# Patient Record
Sex: Male | Born: 1975 | Race: Black or African American | Hispanic: No | Marital: Married | Smoking: Former smoker
Health system: Southern US, Community
[De-identification: ages and names within clinical notes are randomized; demographics above are authoritative.]

## PROBLEM LIST (undated history)

## (undated) DIAGNOSIS — R609 Edema, unspecified: Secondary | ICD-10-CM

## (undated) DIAGNOSIS — D863 Sarcoidosis of skin: Secondary | ICD-10-CM

## (undated) DIAGNOSIS — N433 Hydrocele, unspecified: Secondary | ICD-10-CM

## (undated) DIAGNOSIS — K219 Gastro-esophageal reflux disease without esophagitis: Secondary | ICD-10-CM

## (undated) DIAGNOSIS — I2721 Secondary pulmonary arterial hypertension: Secondary | ICD-10-CM

## (undated) DIAGNOSIS — I10 Essential (primary) hypertension: Secondary | ICD-10-CM

## (undated) DIAGNOSIS — R600 Localized edema: Secondary | ICD-10-CM

## (undated) DIAGNOSIS — J309 Allergic rhinitis, unspecified: Secondary | ICD-10-CM

## (undated) DIAGNOSIS — D563 Thalassemia minor: Secondary | ICD-10-CM

## (undated) DIAGNOSIS — M7021 Olecranon bursitis, right elbow: Secondary | ICD-10-CM

## (undated) DIAGNOSIS — E669 Obesity, unspecified: Secondary | ICD-10-CM

## (undated) DIAGNOSIS — R7303 Prediabetes: Secondary | ICD-10-CM

## (undated) DIAGNOSIS — E66812 Obesity, class 2: Secondary | ICD-10-CM

## (undated) DIAGNOSIS — D860 Sarcoidosis of lung: Secondary | ICD-10-CM

## (undated) DIAGNOSIS — J310 Chronic rhinitis: Secondary | ICD-10-CM

## (undated) DIAGNOSIS — N182 Chronic kidney disease, stage 2 (mild): Secondary | ICD-10-CM

## (undated) DIAGNOSIS — J381 Polyp of vocal cord and larynx: Secondary | ICD-10-CM

## (undated) DIAGNOSIS — R490 Dysphonia: Secondary | ICD-10-CM

## (undated) HISTORY — DX: Edema, unspecified: R60.9

## (undated) HISTORY — DX: Olecranon bursitis, right elbow: M70.21

## (undated) HISTORY — DX: Secondary pulmonary arterial hypertension: I27.21

## (undated) HISTORY — DX: Hydrocele, unspecified: N43.3

## (undated) HISTORY — DX: Dysphonia: R49.0

## (undated) HISTORY — DX: Gastro-esophageal reflux disease without esophagitis: K21.9

## (undated) HISTORY — DX: Polyp of vocal cord and larynx: J38.1

## (undated) HISTORY — DX: Essential (primary) hypertension: I10

## (undated) HISTORY — DX: Sarcoidosis of skin: D86.3

## (undated) HISTORY — DX: Thalassemia minor: D56.3

## (undated) HISTORY — DX: Chronic rhinitis: J31.0

## (undated) HISTORY — DX: Allergic rhinitis, unspecified: J30.9

## (undated) HISTORY — DX: Localized edema: R60.0

## (undated) HISTORY — DX: Chronic kidney disease, stage 2 (mild): N18.2

## (undated) HISTORY — DX: Prediabetes: R73.03

## (undated) HISTORY — DX: Obesity, unspecified: E66.9

## (undated) HISTORY — DX: Obesity, class 2: E66.812

---

## 2003-06-10 HISTORY — PX: WISDOM TOOTH EXTRACTION: SHX21

## 2008-06-09 HISTORY — PX: SKIN GRAFT: SHX250

## 2010-03-29 ENCOUNTER — Encounter: Admission: RE | Admit: 2010-03-29 | Discharge: 2010-03-29 | Payer: Self-pay | Admitting: Family Medicine

## 2010-04-12 ENCOUNTER — Ambulatory Visit: Payer: Self-pay | Admitting: Critical Care Medicine

## 2010-04-12 ENCOUNTER — Encounter: Payer: Self-pay | Admitting: Critical Care Medicine

## 2010-04-12 DIAGNOSIS — R05 Cough: Secondary | ICD-10-CM

## 2010-04-12 DIAGNOSIS — R059 Cough, unspecified: Secondary | ICD-10-CM | POA: Insufficient documentation

## 2010-04-19 ENCOUNTER — Ambulatory Visit: Payer: Self-pay | Admitting: Cardiology

## 2010-04-21 ENCOUNTER — Encounter: Payer: Self-pay | Admitting: Critical Care Medicine

## 2010-05-17 ENCOUNTER — Ambulatory Visit: Payer: Self-pay | Admitting: Critical Care Medicine

## 2010-05-17 DIAGNOSIS — K219 Gastro-esophageal reflux disease without esophagitis: Secondary | ICD-10-CM

## 2010-05-17 DIAGNOSIS — J309 Allergic rhinitis, unspecified: Secondary | ICD-10-CM

## 2010-06-12 ENCOUNTER — Telehealth: Payer: Self-pay | Admitting: Critical Care Medicine

## 2010-06-21 ENCOUNTER — Encounter: Payer: Self-pay | Admitting: Critical Care Medicine

## 2010-06-21 ENCOUNTER — Ambulatory Visit
Admission: RE | Admit: 2010-06-21 | Discharge: 2010-06-21 | Payer: Self-pay | Source: Home / Self Care | Attending: Critical Care Medicine | Admitting: Critical Care Medicine

## 2010-06-25 ENCOUNTER — Telehealth: Payer: Self-pay | Admitting: Critical Care Medicine

## 2010-07-09 NOTE — Assessment & Plan Note (Signed)
Summary: Pulmonary Consultation   Copy to:  Dr. Joseph Art Primary Wyoma Genson/Referring Endiya Klahr:  NA  CC:  Pulmonary Consult - cough..  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  not irritating.  cough a few seconds and stop cough was dry.  never productive.  then one month ago,  began to worsen,  would formerly come on monthly and go away, but this time felt like could not overcome the urge to cough.  The cough was repetative.  Had phlegm.  The swelling in feet.  Always have foot pain.  swelling for one month  impressions from the socks.  then saw lauenstein rx diuretic, did not help ,  Hctz at first then lasix pain in scrotal area and had a lump.  cxr obtained.  u/s ordered. of scrotum  then one week later and was told ? sarcoid.  cxr:  neg.  no pfts.  occ now is dry cough  2005 was exposed to a chemical TDH  actively used in foam.  sweats and dyspnea.   2weeks was exposed.  vapor.         This is a 35year old male who presents with cough.  The patient complains of cough and mucous production, but denies history of diagnosed COPD, shortness of breath, chest tightness, chest pain worse with breathing and coughing, wheezing, nocturnal awakening, exercise induced symptoms, and congestion.  The cough is described as non-productive, productive of clear sputum, perceived to be from upper airway, associated with chest pain, does not interrupt sleep, occurs after eating, and without wheezing.  The dyspnea is described as insignificant.  The patient reports a history of occupational exposure:.  The patient denies any history of asthma, allergic rhinitis, COPD, sleep disordered breathing, obstructive sleep apnea, heart disease, thyroid disease, anemia, collagen vascular disease, osteporosis, kyphoscoliosis, occupational exposure: , and  cancer: .    Preventive Screening-Counseling & Management  Alcohol-Tobacco     Smoking Status: quit > 6 months     Year Started: 1996     Year Quit: 1997  Current Medications (verified): 1)  Furosemide 20 Mg Tabs (Furosemide) .... Take 1 Tablet By Mouth Once A Day  Allergies (verified): No Known Drug Allergies  Past History:  Past medical, surgical, family and social histories (including risk factors) reviewed, and no changes noted (except as noted below).  Past Medical History: COUGH (ICD-786.2)  Past Surgical History: skin graft on left finger  Family History: Reviewed history and no changes required. Family History Sarcoidosis Father  Social History: Reviewed history and no changes required. Former Smoker.  Quit in 1997.  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  The patient denies shortness of breath with activity, shortness of breath at rest, irregular heartbeats, acid heartburn, indigestion, weight change, abdominal pain, difficulty swallowing, sore throat, tooth/dental problems, nasal congestion/difficulty breathing through nose, sneezing, ear ache, anxiety, depression, rash, and change in color of mucus.        See HPI for Pulmonary, Cardiac, General, and ENT review of systems.  Vital Signs:  Patient profile:  35 year old male Height:      74 inches Weight:      256.25 pounds BMI:     33.02 O2 Sat:      98 % on Room air Pulse rate:   78 / minute BP sitting:   124 / 86  (right arm)  Vitals Entered By: Raymondo Band RN (April 12, 2010 9:49 AM)  O2 Flow:  Room air CC: Pulmonary Consult - cough. Comments Medications reviewed with patient Daytime contact number verified with patient. Raymondo Band RN  April 12, 2010 9:45 AM    Physical Exam  Additional Exam:  Gen: Pleasant, well-nourished, in no distress,  normal affect ENT: No lesions,  mouth clear,  oropharynx clear, no postnasal drip Neck: No JVD, no TMG, no carotid bruits Lungs: No use of accessory muscles, no dullness to percussion, clear without rales or rhonchi Cardiovascular: RRR, heart sounds normal, no murmur or gallops, no peripheral edema Abdomen: soft and NT, no HSM,  BS normal Musculoskeletal: No deformities, no cyanosis or clubbing Neuro: alert, non focal Skin: Warm, no lesions or rashes    Impression & Recommendations:  Problem # 1:  COUGH (ICD-786.2) Assessment Comment Only cyclic cough, doubt sarcoid, suspect pn drip, gerd and upper airway instability plan Start flonase two sprays each nostril daily Start omeprazole one daily 1/2 hour before meals  Qvar two puff twice daily Cyclic cough protocol with tramadol and tessalon perles Reflux diet CT chest will be obtained Return one month Orders: Spirometry w/Graph (94010) New Patient Level V (67289) Radiology Referral (Radiology)  Medications Added to Medication List This Visit: 1)  Furosemide 20 Mg Tabs (Furosemide) .... Take 1 tablet by mouth once a day 2)  Tramadol Hcl 50 Mg Tabs (Tramadol hcl) .... Two by mouth every 4 times a day for cough 3)  Tessalon Perles 100 Mg Caps (Benzonatate) .... One to two by mouth 3-4 times daily generic please 4)  Qvar 40 Mcg/act Aers (Beclomethasone dipropionate) .... Two puffs twice daily 5)  Fluticasone Propionate 50 Mcg/act Susp (Fluticasone propionate) .... Two sprays each nostril daily 6)  Omeprazole 20 Mg Cpdr (Omeprazole) .... By mouth daily. take one half hour before eating.  Complete Medication List: 1)  Furosemide 20 Mg Tabs (Furosemide) .... Take 1 tablet by mouth once a day 2)  Tramadol Hcl 50 Mg Tabs (Tramadol hcl) .... Two by mouth every 4 times a day for cough 3)  Tessalon Perles 100 Mg Caps (Benzonatate) .... One  to two by mouth 3-4 times daily generic please 4)  Qvar 40 Mcg/act Aers (Beclomethasone dipropionate) .... Two puffs twice daily 5)  Fluticasone Propionate 50 Mcg/act Susp (Fluticasone propionate) .... Two sprays each nostril daily 6)  Omeprazole 20 Mg Cpdr (Omeprazole) .... By mouth daily. take one half hour before eating.  Patient Instructions: 1)  Start flonase two sprays each nostril daily 2)  Start omeprazole one daily 1/2 hour before meals  3)  Qvar two puff twice daily 4)  Cyclic cough protocol with tramadol and tessalon perles 5)  Reflux diet 6)  CT chest will be obtained 7)  Return one month Prescriptions: OMEPRAZOLE 20 MG  CPDR (OMEPRAZOLE) By mouth daily. Take one half hour before eating.  #30 x 1   Entered and Authorized by:   Elsie Stain MD   Signed by:   Elsie Stain MD on 04/12/2010   Method used:   Electronically to  [facility] [address] [phone]* (retail)       [address] [place], [place]  [address]       Ph: [phone] or [phone]       Fax: [phone]   RxID:   [id number] FLUTICASONE PROPIONATE 50 MCG/ACT SUSP (FLUTICASONE PROPIONATE) two sprays each nostril daily  #1 x 4   Entered and Authorized by:   Elsie Stain MD   Signed by:   Elsie Stain MD on 04/12/2010   Method used:   Electronically to        [facility]. [address] [phone]* (retail)       [address] [place], [place]  [address]       Ph: [phone] or [phone]       Fax: [phone]   RxID:   [id number] QVAR 40 MCG/ACT  AERS (BECLOMETHASONE DIPROPIONATE) Two puffs twice daily  #1 x 6   Entered and Authorized by:   Elsie Stain MD   Signed by:   Elsie Stain MD on 04/12/2010   Method used:   Electronically to        [facility]. [address] [phone]* (retail)       [address] [place], [place]  [address]       Ph: [phone] or [phone]       Fax: [phone]   RxID:    [id number] TESSALON PERLES 100 MG  CAPS (BENZONATATE) One to two by mouth 3-4 times daily generic please  #90 x 4   Entered and Authorized by:   Elsie Stain MD   Signed by:   Elsie Stain MD on 04/12/2010   Method used:   Electronically to        [facility]. [address] [phone]* (retail)       [address] [place], [place]  [address]       Ph: [phone] or [phone]       Fax: [phone]   RxID:   [id number] TRAMADOL HCL 50 MG  TABS (TRAMADOL HCL) two by mouth every 4 times a day for cough  #30 x 0   Entered and Authorized by:   Elsie Stain MD   Signed by:   Elsie Stain MD on 04/12/2010   Method used:   Electronically to        [facility]. [address] [phone]* (retail)       [address]       [place], [place]  [address]       Ph: [phone] or [phone]       Fax: [phone]   RxID:   [id number]

## 2010-07-09 NOTE — Miscellaneous (Signed)
Summary: CT Chest   Clinical Lists Changes  Observations: Added new observation of CT OF CHEST: Findings: No evidence of axillary or supraclavicular lymphadenopathy.  There is mild mediastinal lymphadenopathy.  A prevascular lymph node measures 11 mm short axis (image 31).  Right lower paratracheal lymph node is at upper limits of normal at 10 mm.  Borderline enlarged 13 mm right hilar lymph node.  There is a trace amount residual thymus within the anterior mediastinum.  No pericardial effusion.   Review of the lung parenchyma demonstrates no evidence of subpleural nodularity or peribronchovascular thickening to suggest pulmonary sarcoidosis.  No acute pulmonary parenchymal abnormalities are evident.  Airways appear normal.   Limited view of the upper abdomen is unremarkable.   Limited view of the skeleton is unremarkable.   IMPRESSION:   1.  No pulmonary parenchymal findings of sarcoidosis. 2.  Mild mediastinal lymphadenopathy is nonspecific.   (04/19/2010 3:05)      CT of Chest  Procedure date:  04/19/2010  Findings:      Findings: No evidence of axillary or supraclavicular lymphadenopathy.  There is mild mediastinal lymphadenopathy.  A prevascular lymph node measures 11 mm short axis (image 31).  Right lower paratracheal lymph node is at upper limits of normal at 10 mm.  Borderline enlarged 13 mm right hilar lymph node.  There is a trace amount residual thymus within the anterior mediastinum.  No pericardial effusion.   Review of the lung parenchyma demonstrates no evidence of subpleural nodularity or peribronchovascular thickening to suggest pulmonary sarcoidosis.  No acute pulmonary parenchymal abnormalities are evident.  Airways appear normal.   Limited view of the upper abdomen is unremarkable.   Limited view of the skeleton is unremarkable.   IMPRESSION:   1.  No pulmonary parenchymal findings of sarcoidosis. 2.  Mild mediastinal lymphadenopathy is  nonspecific.

## 2010-07-11 NOTE — Progress Notes (Signed)
Summary: cough> pred taper  Phone Note Call from Patient Call back at Work Phone [phone]   Caller: Patient Call For: Keilah Lemire Summary of Call: Pt states he has a cough that seems to be getting worse and wants to know if he needs to be seen or have something called in pls advise.//[facility] Initial call taken by: Netta Neat,  June 12, 2010 12:33 PM  Follow-up for Phone Call        [place] with pt and he states while he was on prednisone and following other recs as far as nasal spray, gerd diet and tessalon perles, his cough was much improved, but once he stopped the prednisone he states his cough returned and it is as bad as it was before he came to see Dr. Joya Gaskins.  Pt states he has been using qvar twice daily and flonas as directed and tessalon perles sicne he finished the prednisone, but these alone have done nothing for his cough. Pt wanted to [facility] what to do next. Please advise. [place] Bing CMA  June 12, 2010 3:14 PM   Additional Follow-up for Phone Call Additional follow up Details #1::        resume prednisone  4 each am x3days, 3 x 3days, 2 x 3days, 1 x 3days then stop Needs OV to regroup soon Additional Follow-up by: Elsie Stain MD,  June 12, 2010 3:49 PM    Additional Follow-up for Phone Call Additional follow up Details #2::    spoke with pt and advised of recs. rx sent for pred taper. appt set fro pt on 06-21-10 at 3:15. [place] Bing CMA  June 12, 2010 4:04 PM   New/Updated Medications: PREDNISONE 10 MG TABS (PREDNISONE) 4 each am x3days, 3 x 3days, 2 x 3days, 1 x 3days then stop Prescriptions: PREDNISONE 10 MG TABS (PREDNISONE) 4 each am x3days, 3 x 3days, 2 x 3days, 1 x 3days then stop  #30 x 0   Entered by:   [place] Bing CMA   Authorized by:   Elsie Stain MD   Signed by:   [place] Bing CMA on 06/12/2010   Method used:   Electronically to        [facility]. [address] [phone]* (retail)       [address]       [place], [place]  [address]       Ph: [phone] or [phone]       Fax: [phone]   RxID:   [id number]

## 2010-07-11 NOTE — Progress Notes (Signed)
Summary: Lab results   Phone Note Outgoing Call   Reason for Call: Discuss lab or test results Summary of Call: call pt and tell him allergy lab tests were normal.  no change in medication profile for now  Initial call taken by: Elsie Stain MD,  June 25, 2010 10:37 AM  Follow-up for Phone Call        Called, spoke with pt.  He was informed of above results and recs per PW and verbalized understanding. Follow-up by: Raymondo Band RN,  June 25, 2010 4:02 PM

## 2010-07-11 NOTE — Assessment & Plan Note (Signed)
Summary: Pulmonary OV   Copy to:  Dr. Joseph Art Primary Provider/Referring Provider:  NA  CC:  1 month follow up.  Cough has improved.  Marland Kitchen  History of Present Illness: Pulmonary OV  35 yo AAM with chronic cyclical  cough due to GERD and postnasal drip syndrome, no primary lung disease, normal PFTS, CXR  The pt notes his cough is better  CT chest neg for sarcoidosis.   now cough is minimal, worse after eat only for 54mn then goes away. The pt has less heartburn on PPI No other new issues.  Current Medications (verified): 1)  Furosemide 20 Mg Tabs (Furosemide) .... Take 1 Tablet By Mouth Once A Day 2)  Tessalon Perles 100 Mg  Caps (Benzonatate) .... One To Two By Mouth 3-4 Times Daily Generic Please 3)  Qvar 40 Mcg/act  Aers (Beclomethasone Dipropionate) .... Two Puffs Twice Daily 4)  Fluticasone Propionate 50 Mcg/act Susp (Fluticasone Propionate) .... Two Sprays Each Nostril Daily 5)  Omeprazole 20 Mg  Cpdr (Omeprazole) .... By Mouth Daily. Take One Half Hour Before Eating.  Allergies (verified): No Known Drug Allergies  Past History:  Past medical, surgical, family and social histories (including risk factors) reviewed, and no changes noted (except as noted below).  Past Medical History: COUGH  cyclical (ITIR-443.1    -GERD, postnasal drip syndrome  Past Surgical History: Reviewed history from 04/12/2010 and no changes required. skin graft on left finger  Family History: Reviewed history from 04/12/2010 and no changes required. Family History Sarcoidosis Father  Social History: Reviewed history from 04/12/2010 and no changes required. Former Smoker.  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  The patient denies shortness of breath with activity, shortness of breath at rest, productive cough, coughing up blood, chest pain, irregular heartbeats, acid heartburn,  indigestion, loss of appetite, weight change, abdominal pain, difficulty swallowing, sore throat, tooth/dental problems, headaches, nasal congestion/difficulty breathing through nose, sneezing, itching, ear ache, anxiety, depression, hand/feet swelling, joint stiffness or pain, rash, change in color of mucus, and fever.    Vital Signs:  Patient profile:   35year old male Height:      74 inches Weight:      252.25 pounds BMI:     32.50 O2 Sat:      98 % on Room air Temp:     98.9 degrees F oral Pulse rate:   83 / minute BP sitting:   132 / 90  (right arm) Cuff size:   large  Vitals Entered By: CRaymondo BandRN (May 17, 2010 10:51 AM)  O2 Flow:  Room air CC: 1 month follow up.  Cough has improved.   Comments Medications reviewed with patient Daytime contact number verified with patient. CRaymondo BandRN  May 17, 2010 10:51 AM    Physical Exam  Additional Exam:  Gen: Pleasant, well-nourished, in no distress,  normal affect ENT: No lesions,  mouth clear,  oropharynx clear, no postnasal drip Neck: No JVD, no TMG, no carotid bruits Lungs: No use of accessory muscles, no dullness to percussion, clear without rales or rhonchi Cardiovascular: RRR, heart sounds normal, no murmur or gallops, no peripheral edema Abdomen: soft and NT, no HSM,  BS normal Musculoskeletal: No deformities, no cyanosis or clubbing Neuro: alert, non focal Skin: Warm, no lesions or rashes    Impression & Recommendations:  Problem # 1:  COUGH (ZOX-096.0) cyclic cough, not due to  sarcoid, better with treatement of suspected post nasal drip syndrome, gerd. plan Continue flonase two sprays each nostril daily Continue  omeprazole one daily 1/2 hour before meals(lunch ok) Qvar two puff twice daily for two more weeks then two puff daily Prednisone 31m Take 4 daily for two days, then 3 daily for two days, then two daily for two days then one daily for two days then stop tessalon perles as needed    Reflux diet>>>moderation Return two months  Medications Added to Medication List This Visit: 1)  Qvar 40 Mcg/act Aers (Beclomethasone dipropionate) .... Two puffs twice daily for two more weeks then two puff daily 2)  Prednisone 10 Mg Tabs (Prednisone) .... Take as directed take 4 daily for two days, then 3 daily for two days, then two daily for two days then one daily for two days then stop  Complete Medication List: 1)  Furosemide 20 Mg Tabs (Furosemide) .... Take 1 tablet by mouth once a day 2)  Tessalon Perles 100 Mg Caps (Benzonatate) .... One to two by mouth 3-4 times daily generic please 3)  Qvar 40 Mcg/act Aers (Beclomethasone dipropionate) .... Two puffs twice daily for two more weeks then two puff daily 4)  Fluticasone Propionate 50 Mcg/act Susp (Fluticasone propionate) .... Two sprays each nostril daily 5)  Omeprazole 20 Mg Cpdr (Omeprazole) .... By mouth daily. take one half hour before eating. 6)  Prednisone 10 Mg Tabs (Prednisone) .... Take as directed take 4 daily for two days, then 3 daily for two days, then two daily for two days then one daily for two days then stop  Other Orders: Est. Patient Level IV ((45409  Patient Instructions: 1)  Continue flonase two sprays each nostril daily 2)  Continue  omeprazole one daily 1/2 hour before meals(lunch ok) 3)  Qvar two puff twice daily for two more weeks then two puff daily 4)  Prednisone 160mTake 4 daily for two days, then 3 daily for two days, then two daily for two days then one daily for two days then stop 5)  tessalon perles as needed  6)  Reflux diet>>>moderation 7)  Return two months Prescriptions: PREDNISONE 10 MG  TABS (PREDNISONE) Take as directed Take 4 daily for two days, then 3 daily for two days, then two daily for two days then one daily for two days then stop  #20 x 0   Entered and Authorized by:   PaElsie StainD   Signed by:   PaElsie StainD on 05/17/2010   Method used:   [facility]ronically to         [facility][phone](retail)       [address] [place]  [id number]     Ph: [phone]     Fax: [phone] RxID:   [id number] PROPIONATE 50 MCG/ACT SUSP (FLUTICASONE PROPIONATE) two sprays each nostril daily  #1 x 6   Entered and Authorized by:   PaElsie StainD   Signed by:   PaElsie StainD on 05/17/2010   Method used:   [facility]        [facility][phone](retail)       [address]  [place], [place]  [address]       Ph: [phone] or [phone]       Fax: [phone]   RxID:   [id number] OMEPRAZOLE 20 MG  CPDR (OMEPRAZOLE) By mouth daily. Take one half hour before eating.  #30 x 2   Entered and Authorized by:   Elsie Stain MD   Signed by:   Elsie Stain MD on 05/17/2010   Method used:   Electronically to        [facility]. [address] [phone]* (retail)       [address]       [place], [place]  [address]       Ph: [phone] or [phone]       Fax: [phone]   RxID:   [id number]

## 2010-07-11 NOTE — Assessment & Plan Note (Signed)
Summary: Pulmonary OV   Copy to:  Dr. Joseph Art Primary Provider/Referring Provider:  NA  CC:  Follow up.  Pt states he believes he still has PND and relates this to reason for cough.  .  History of Present Illness: Pulmonary OV  35 yo AAM with chronic cyclical  cough due to GERD and postnasal drip syndrome, no primary lung disease, normal PFTS, CXR  The pt notes his cough is better  CT chest neg for sarcoidosis.   now cough is minimal, worse after eat only for 19mn then goes away. The pt has less heartburn on PPI No other new issues.   June 21, 2010 3:45 PM cough is better but as dose is less is sl moe urge to  now dwon to two/d of pred when pred is tapered off gets worse, resumed pulse over phone on 06/12/10 never had allergy testing done cyclical cough since [address] working in [employer] since 2005  Current Medications (verified): 1)  Tessalon Perles 100 Mg  Caps (Benzonatate) .... One To Two By Mouth 3-4 Times Daily  As Needed Generic Please 2)  Qvar 40 Mcg/act  Aers (Beclomethasone Dipropionate) .... Two Puffs Twice Daily 3)  Fluticasone Propionate 50 Mcg/act Susp (Fluticasone Propionate) .... Two Sprays Each Nostril Daily 4)  Omeprazole 20 Mg  Cpdr (Omeprazole) .... By Mouth Daily. Take One Half Hour Before Eating. 5)  Prednisone 10 Mg Tabs (Prednisone) .... 4 Each Am X3days, 3 X 3days, 2 X 3days, 1 X 3days Then Stop  Allergies (verified): No Known Drug Allergies  Past History:  Past medical, surgical, family and social histories (including risk factors) reviewed, and no changes noted (except as noted below).  Past Medical History: Reviewed history from 05/17/2010 and no changes required. COUGH  cyclical (IFBP-102.5    -GERD, postnasal drip syndrome  Past Surgical History: Reviewed history from 04/12/2010 and no changes required. skin graft on left finger  Family History: Reviewed history from 04/12/2010 and no changes required. Family History  Sarcoidosis Father  Social History: Reviewed history from 04/12/2010 and no changes required. Former Smoker.  Quit in 1997.  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  The patient denies shortness of breath at rest, productive cough, non-productive cough, coughing up blood, chest pain, irregular heartbeats, acid heartburn, indigestion, loss of appetite, weight change, abdominal pain, difficulty swallowing, sore throat, tooth/dental problems, headaches, nasal congestion/difficulty breathing through nose, sneezing, itching, ear ache, anxiety, depression, hand/feet swelling, joint stiffness or pain, rash, change in color of mucus, and fever.    Vital Signs:  Patient profile:   35year old male Height:      74 inches Weight:      246.25 pounds BMI:     31.73 O2 Sat:      98 % on Room air Temp:     98.3 degrees F oral Pulse rate:   81 / minute BP sitting:   164 / 98  (right arm) Cuff size:   large  Vitals Entered By: CRaymondo BandRN (June 21, 2010 3:30 PM)  O2 Flow:  Room air CC: Follow up.  Pt states he believes he still has PND and relates this to reason for cough.   Comments Medications reviewed with patient Daytime contact number verified with patient. CRaymondo BandRN  June 21, 2010 3:31 PM    Physical Exam  Additional Exam:  Gen: Pleasant, well-nourished, in no distress,  normal affect ENT: No lesions,  mouth clear,  oropharynx clear, no postnasal drip Neck: No JVD, no TMG, no carotid bruits Lungs: No use of accessory muscles, no dullness to percussion, clear without rales or rhonchi Cardiovascular: RRR, heart sounds normal, no murmur or gallops, no peripheral edema Abdomen: soft and NT, no HSM,  BS normal Musculoskeletal: No deformities, no cyanosis or clubbing Neuro: alert, non focal Skin: Warm, no lesions or rashes    Impression & Recommendations:  Problem # 1:   COUGH (ICD-786.2)  Orders: Est. Patient Level IV (83291) T-"RAST" (Allergy Full Profile) IGE (91660-60045)  cyclic cough, not due to  sarcoid, better with treatement of suspected post nasal drip syndrome, gerd. plan Recycle prednisone to 2 a day for 5 days the down by 1/2 every 5days until off Start on astepro two sprays each nostril daily Stay on Qvar Stop tessalon Labs today RAST IgE to assess allergy factors leading to allergic rhinitis  Stay on omeprazole Return two months  Problem # 2:  ALLERGIC RHINITIS (ICD-477.9) Assessment: Deteriorated see assess one  His updated medication list for this problem includes:    Fluticasone Propionate 50 Mcg/act Susp (Fluticasone propionate) .Marland Kitchen..Marland Kitchen Two sprays each nostril daily    Astepro 0.15 % Soln (Azelastine hcl) .Marland Kitchen..Marland Kitchen Two sprays each nostril daily  Orders: Est. Patient Level IV (99774) T-"RAST" (Allergy Full Profile) IGE (14239-53202)  Medications Added to Medication List This Visit: 1)  Tessalon Perles 100 Mg Caps (Benzonatate) .... One to two by mouth 3-4 times daily  as needed generic please 2)  Qvar 40 Mcg/act Aers (Beclomethasone dipropionate) .... Two puffs twice daily 3)  Prednisone 10 Mg Tabs (Prednisone) .... Two a day for 5 days , 1 1/2 a day for 5 days , 1 a day for 5 days , then 1/2 a day for 5 days and stop 4)  Astepro 0.15 % Soln (Azelastine hcl) .... Two sprays each nostril daily  Complete Medication List: 1)  Qvar 40 Mcg/act Aers (Beclomethasone dipropionate) .... Two puffs twice daily 2)  Fluticasone Propionate 50 Mcg/act Susp (Fluticasone propionate) .... Two sprays each nostril daily 3)  Omeprazole 20 Mg Cpdr (Omeprazole) .... By mouth daily. take one half hour before eating. 4)  Prednisone 10 Mg Tabs (Prednisone) .... Two a day for 5 days , 1 1/2 a day for 5 days , 1 a day for 5 days , then 1/2 a day for 5 days and stop 5)  Astepro 0.15 % Soln (Azelastine hcl) .... Two sprays each nostril daily  Patient  Instructions: 1)  Recycle prednisone to 2 a day for 5 days the down by 1/2 every 5days until off 2)  Start on astepro two sprays each nostril daily 3)  Stay on Qvar 4)  Stop tessalon 5)  Labs today we will call with results 6)  Stay on omeprazole 7)  Return two months Prescriptions: ASTEPRO 0.15 % SOLN (AZELASTINE HCL) two sprays each nostril daily  #1 x 4   Entered and Authorized by:   Elsie Stain MD   Signed by:   Elsie Stain MD on 06/21/2010   Method used:   Electronically to        [facility]. [address]. [phone]* (retail)       [address]       [place], [place]  [address]  Ph: [phone] or [phone]       Fax: [phone]   RxID:   [id number] PREDNISONE 10 MG TABS (PREDNISONE) two a day for 5 days , 1 1/2 a day for 5 days , 1 a day for 5 days , then 1/2 a day for 5 days and stop  #40 x 1   Entered and Authorized by:   Elsie Stain MD   Signed by:   Elsie Stain MD on 06/21/2010   Method used:   Electronically to        [facility]. [address] [phone]* (retail)       [address]       [place], [place]  [address]       Ph: [phone] or [phone]       Fax: [phone]   RxID:   [id number]

## 2010-09-23 ENCOUNTER — Emergency Department (HOSPITAL_COMMUNITY)
Admission: EM | Admit: 2010-09-23 | Discharge: 2010-09-23 | Disposition: A | Discharge status: Home / Self Care | Payer: 59 | Attending: Emergency Medicine | Admitting: Emergency Medicine

## 2010-09-23 DIAGNOSIS — R0982 Postnasal drip: Secondary | ICD-10-CM | POA: Insufficient documentation

## 2010-09-23 DIAGNOSIS — I10 Essential (primary) hypertension: Secondary | ICD-10-CM | POA: Insufficient documentation

## 2010-09-23 DIAGNOSIS — J3489 Other specified disorders of nose and nasal sinuses: Secondary | ICD-10-CM | POA: Insufficient documentation

## 2012-02-04 ENCOUNTER — Telehealth: Payer: Self-pay | Admitting: Critical Care Medicine

## 2012-02-04 NOTE — Telephone Encounter (Signed)
Needs to be seen before I can Rx anything.  ?see NP sooner

## 2012-02-04 NOTE — Telephone Encounter (Signed)
Called, spoke with pt.  He is requesting appt on Friday.  OV scheduled with TP on Friday, Aug 30 at 3:15 pm in [place].  Pt aware.

## 2012-02-04 NOTE — Telephone Encounter (Signed)
Called and spoke with patient-patient states last time he was seen in Dec.2011 PW saw for "rhinitis, post nasal drip, acid reflux, and cough" patient says he was prescribed some medications to help with these symptoms in which they did help although he did not take meds regularly.  Patient reports since the summer he has noticed some symptoms gradually returning.  Symptoms not as bad as they were before but is requesting medications to be ordered for him--medications previously ordered were omeprazole, prednisone taper and QVar. Pt requesting esp QVar and prednisone says he would like to take two prednisone a day "or something like that." Patient says very happy with how PW handled his symptoms and wants him to manage symptoms again.  Informed patient that appt needs to be scheduled in which one was for 02-18-2012 at 945am.  Dr. Joya Gaskins please advise on medication management for pt until scheduled appt.  Thank You

## 2012-02-06 ENCOUNTER — Encounter: Payer: Self-pay | Admitting: Adult Health

## 2012-02-06 ENCOUNTER — Ambulatory Visit (INDEPENDENT_AMBULATORY_CARE_PROVIDER_SITE_OTHER): Payer: 59 | Admitting: Adult Health

## 2012-02-06 VITALS — BP 152/84 | HR 85 | Temp 97.4°F | Ht 74.0 in | Wt 271.2 lb

## 2012-02-06 DIAGNOSIS — R05 Cough: Secondary | ICD-10-CM

## 2012-02-06 MED ORDER — FLUTICASONE PROPIONATE 50 MCG/ACT NA SUSP: 2.0000 | Freq: Every day | NASAL | Status: DC

## 2012-02-06 MED ORDER — BECLOMETHASONE DIPROPIONATE 40 MCG/ACT IN AERS: 2.0000 | INHALATION_SPRAY | Freq: Two times a day (BID) | RESPIRATORY_TRACT | Status: DC

## 2012-02-06 MED ORDER — PREDNISONE 10 MG PO TABS: ORAL_TABLET | ORAL | Status: DC

## 2012-02-06 NOTE — Assessment & Plan Note (Signed)
Flare of cyclical cough secondary to GERD and AR   Plan;  Prednisone taper over next week.  Deslym 2 tsp Twice daily  As needed   Begin Flonase nasal spray each nostril daily  Zyrtec 85m daily As needed  Drainage  Begin omeprazole 258mdaily before meal .  Begin QVAR 2 puffs Twice daily  -brush/rinse /gargle after use.  Please contact office for sooner follow up if symptoms do not improve or worsen or seek emergency care  follow up Dr. WrJoya GaskinsIn 6 weeks

## 2012-02-06 NOTE — Patient Instructions (Addendum)
Prednisone taper over next week.  Deslym 2 tsp Twice daily  As needed   Begin Flonase nasal spray each nostril daily  Zyrtec 28m daily As needed  Drainage  Begin omeprazole 229mdaily before meal .  Begin QVAR 2 puffs Twice daily  -brush/rinse /gargle after use.  Please contact office for sooner follow up if symptoms do not improve or worsen or seek emergency care  follow up Dr. WrJoya GaskinsIn 6 weeks

## 2012-02-06 NOTE — Progress Notes (Signed)
  Subjective:    Patient ID: Sean Sharp, male    DOB: 10-09-1975, 36 y.o.   MRN: [id number]  HPI 36 yo AAM with known hx of cyclical cough   5/44/9201 Acute OV  Complains of increased occasional dry cough x1 month; worse x 2 weeks  Last seen 0/0712 for cyclical cough, says his cough totally resolved until 2 weeks ago  Feels gerd and drainage are aggravating his cough.  No recent travel or a bx  No fever or edema.    Review of Systems Constitutional:   No  weight loss, night sweats,  Fevers, chills, fatigue, or  lassitude.  HEENT:   No headaches,  Difficulty swallowing,  Tooth/dental problems, or  Sore throat,                No sneezing, itching, ear ache,  +nasal congestion, post nasal drip,   CV:  No chest pain,  Orthopnea, PND, swelling in lower extremities, anasarca, dizziness, palpitations, syncope.   GI  No  abdominal pain, nausea, vomiting, diarrhea, change in bowel habits, loss of appetite, bloody stools.   Resp:   No coughing up of blood. Marland Kitchen  No chest wall deformity  Skin: no rash or lesions.  GU: no dysuria, change in color of urine, no urgency or frequency.  No flank pain, no hematuria   MS:  No joint pain or swelling.  No decreased range of motion.  No back pain.  Psych:  No change in mood or affect. No depression or anxiety.  No memory loss.         Objective:   Physical Exam GEN: A/Ox3; pleasant , NAD, well nourished   HEENT:  [place]/AT,  EACs-clear, TMs-wnl, NOSE-clear, THROAT-clear, no lesions, no postnasal drip or exudate noted.   NECK:  Supple w/ fair ROM; no JVD; normal carotid impulses w/o bruits; no thyromegaly or nodules palpated; no lymphadenopathy.  RESP  Clear  P & A; w/o, wheezes/ rales/ or rhonchi.no accessory muscle use, no dullness to percussion  CARD:  RRR, no m/r/g  , no peripheral edema, pulses intact, no cyanosis or clubbing.  GI:   Soft & nt; nml bowel sounds; no organomegaly or masses detected.  Musco: Warm bil, no deformities or  joint swelling noted.   Neuro: alert, no focal deficits noted.    Skin: Warm, no lesions or rashes         Assessment & Plan:

## 2012-02-18 ENCOUNTER — Ambulatory Visit: Payer: 59 | Admitting: Critical Care Medicine

## 2012-03-22 ENCOUNTER — Ambulatory Visit (INDEPENDENT_AMBULATORY_CARE_PROVIDER_SITE_OTHER): Payer: 59 | Admitting: Critical Care Medicine

## 2012-03-22 ENCOUNTER — Encounter: Payer: Self-pay | Admitting: Critical Care Medicine

## 2012-03-22 VITALS — BP 168/100 | HR 72 | Temp 98.5°F | Ht 74.0 in | Wt 272.8 lb

## 2012-03-22 DIAGNOSIS — R05 Cough: Secondary | ICD-10-CM

## 2012-03-22 MED ORDER — OMEPRAZOLE MAGNESIUM 20 MG PO TBEC: DELAYED_RELEASE_TABLET | ORAL | Status: DC

## 2012-03-22 NOTE — Progress Notes (Signed)
Subjective:    Patient ID: Sean Sharp, male    DOB: [birth year], 36 y.o.   MRN: [id number]  HPI  36 yo AAM with known hx of cyclical cough   0/10/93  Acute OV  Complains of increased occasional dry cough x1 month; worse x 2 weeks  Last seen 07/3555 for cyclical cough, says his cough totally resolved until 2 weeks ago  Feels gerd and drainage are aggravating his cough.  No recent travel or a bx  No fever or edema.   03/22/2012 Pt started coughing again Pt saw TP and she recommended Prednisone taper over next week.  Deslym 2 tsp Twice daily As needed  Begin Flonase nasal spray each nostril daily  Zyrtec 6m daily As needed Drainage  Begin omeprazole 245mdaily before meal .  Begin QVAR 2 puffs Twice daily -brush/rinse /gargle after use.   Pt did not start the Qvar.   Pt did not start omeprazole   Now no real heartburn.  Does not follow a reflux diet Now cough is better but gets triggered with too full, drinks sodas, or temp changes.    Review of Systems  Constitutional:   No  weight loss, night sweats,  Fevers, chills, fatigue, or  lassitude.  HEENT:   No headaches,  Difficulty swallowing,  Tooth/dental problems, or  Sore throat,                No sneezing, itching, ear ache,  +nasal congestion, post nasal drip,   CV:  No chest pain,  Orthopnea, PND, swelling in lower extremities, anasarca, dizziness, palpitations, syncope.   GI  No  abdominal pain, nausea, vomiting, diarrhea, change in bowel habits, loss of appetite, bloody stools.   Resp:   No coughing up of blood. . Marland KitchenNo chest wall deformity  Skin: no rash or lesions.  GU: no dysuria, change in color of urine, no urgency or frequency.  No flank pain, no hematuria   MS:  No joint pain or swelling.  No decreased range of motion.  No back pain.  Psych:  No change in mood or affect. No depression or anxiety.  No memory loss.         Objective:   Physical Exam BP 168/100  Pulse 72  Temp 98.5 F (36.9 C)  (Oral)  Ht _0  (1.88 m)  Wt 272 lb 12.8 oz (123.741 kg)  BMI 35.03 kg/m2  SpO2 100%  GEN: A/Ox3; pleasant , NAD, well nourished   HEENT:  [place]/AT,  EACs-clear, TMs-wnl, NOSE-clear, THROAT-clear, no lesions, no postnasal drip or exudate noted.   NECK:  Supple w/ fair ROM; no JVD; normal carotid impulses w/o bruits; no thyromegaly or nodules palpated; no lymphadenopathy.  RESP  Clear  P & A; w/o, wheezes/ rales/ or rhonchi.no accessory muscle use, no dullness to percussion  CARD:  RRR, no m/r/g  , no peripheral edema, pulses intact, no cyanosis or clubbing.  GI:   Soft & nt; nml bowel sounds; no organomegaly or masses detected.  Musco: Warm bil, no deformities or joint swelling noted.   Neuro: alert, no focal deficits noted.    Skin: Warm, no lesions or rashes         Assessment & Plan:   Cough Cyclical cough with upper airway instability without true asthma precipitated by postnasal drip syndrome and reflux disease Plan Use over the counter prilosec one daily as needed, when using this ,take on empty stomach then eat 20-3038mlater Stop Qvar Stay  on flonase daily two puff each nostril Follow a reflux diet as best as you can Keep a sugar free candy drop in your mouth if you are coughing, train yourself to swallow rather than cough or clear the throat Return as needed      Updated Medication List Outpatient Encounter Prescriptions as of 03/22/2012  Medication Sig Dispense Refill  . DISCONTD: predniSONE (DELTASONE) 10 MG tablet 4 tabs for 2 days, then 3 tabs for 2 days, 2 tabs for 2 days, then 1 tab for 2 days, then stop  20 tablet  0  . DISCONTD: predniSONE (DELTASONE) 10 MG tablet as needed.      . fluticasone (FLONASE) 50 MCG/ACT nasal spray Place 2 sprays into the nose daily.  16 g  5  . omeprazole (PRILOSEC OTC) 20 MG tablet Use one daily as needed for heartburn      . DISCONTD: acetaminophen (TYLENOL) 325 MG tablet Per bottle as needed      . DISCONTD:  beclomethasone (QVAR) 40 MCG/ACT inhaler Inhale 2 puffs into the lungs 2 (two) times daily.  1 Inhaler  12

## 2012-03-22 NOTE — Patient Instructions (Addendum)
Use over the counter prilosec one daily as needed, when using this ,take on empty stomach then eat 20-52mn later Stop Qvar Stay on flonase daily two puff each nostril Follow a reflux diet as best as you can Keep a sugar free candy drop in your mouth if you are coughing, train yourself to swallow rather than cough or clear the throat Return as needed

## 2012-03-23 NOTE — Assessment & Plan Note (Signed)
Cyclical cough with upper airway instability without true asthma precipitated by postnasal drip syndrome and reflux disease Plan Use over the counter prilosec one daily as needed, when using this ,take on empty stomach then eat 20-43mn later Stop Qvar Stay on flonase daily two puff each nostril Follow a reflux diet as best as you can Keep a sugar free candy drop in your mouth if you are coughing, train yourself to swallow rather than cough or clear the throat Return as needed

## 2012-11-12 ENCOUNTER — Ambulatory Visit (INDEPENDENT_AMBULATORY_CARE_PROVIDER_SITE_OTHER): Payer: 59 | Admitting: Critical Care Medicine

## 2012-11-12 ENCOUNTER — Encounter: Payer: Self-pay | Admitting: Critical Care Medicine

## 2012-11-12 VITALS — BP 150/92 | HR 80 | Temp 99.2°F | Ht 74.0 in | Wt 268.2 lb

## 2012-11-12 DIAGNOSIS — R05 Cough: Secondary | ICD-10-CM

## 2012-11-12 MED ORDER — OMEPRAZOLE 20 MG PO CPDR: 20.0000 mg | DELAYED_RELEASE_CAPSULE | Freq: Every day | ORAL | Status: DC

## 2012-11-12 MED ORDER — AZELASTINE-FLUTICASONE 137-50 MCG/ACT NA SUSP: 1.0000 | Freq: Two times a day (BID) | NASAL | Status: DC

## 2012-11-12 NOTE — Assessment & Plan Note (Signed)
Cyclical cough on the basis of reflux and postnasal drip syndrome Plan Continue omeprazole daily Continue nasal steroid and nasal antihistamine daily Reflux diet

## 2012-11-12 NOTE — Patient Instructions (Signed)
Use omeprazole daily 42m before breakfast Follow reflux diet, if you want GI referral , let [place] Try Dymista one puff each nostril daily, if it works better than flonase let [place]now and we will send Rx, use samples to start Return 6 months or as needed

## 2012-11-12 NOTE — Progress Notes (Signed)
Subjective:    Patient ID: Sean Sharp, male    DOB: 1975-07-25, 37 y.o.   MRN: [id number]  HPI  37 y.o. AAM with known hx of cyclical cough   07/13/4008  Chief Complaint  Patient presents with  . Follow-up    Coughing "from time to time."  Cough is prod at times with clear mucus.  pt notes cough is still present. Worse in winter time Pt is using prilosec prn.  Pt notes worse in cold air Pt tries to eat less volume.     Review of Systems  Constitutional:   No  weight loss, night sweats,  Fevers, chills, fatigue, or  lassitude.  HEENT:   No headaches,  Difficulty swallowing,  Tooth/dental problems, or  Sore throat,                No sneezing, itching, ear ache,  +nasal congestion, post nasal drip,   CV:  No chest pain,  Orthopnea, PND, swelling in lower extremities, anasarca, dizziness, palpitations, syncope.   GI  No  abdominal pain, nausea, vomiting, diarrhea, change in bowel habits, loss of appetite, bloody stools.   Resp:   No coughing up of blood. Marland Kitchen  No chest wall deformity  Skin: no rash or lesions.  GU: no dysuria, change in color of urine, no urgency or frequency.  No flank pain, no hematuria   MS:  No joint pain or swelling.  No decreased range of motion.  No back pain.  Psych:  No change in mood or affect. No depression or anxiety.  No memory loss.         Objective:   Physical Exam BP 150/92  Pulse 80  Temp(Src) 99.2 F (37.3 C) (Oral)  Ht _0  (1.88 m)  Wt 268 lb 3.2 oz (121.655 kg)  BMI 34.42 kg/m2  SpO2 99%  GEN: A/Ox3; pleasant , NAD, well nourished   HEENT:  [place]/AT,  EACs-clear, TMs-wnl, NOSE-clear, THROAT-clear, no lesions, no postnasal drip or exudate noted.   NECK:  Supple w/ fair ROM; no JVD; normal carotid impulses w/o bruits; no thyromegaly or nodules palpated; no lymphadenopathy.  RESP  Clear  P & A; w/o, wheezes/ rales/ or rhonchi.no accessory muscle use, no dullness to percussion  CARD:  RRR, no m/r/g  , no peripheral edema,  pulses intact, no cyanosis or clubbing.  GI:   Soft & nt; nml bowel sounds; no organomegaly or masses detected.  Musco: Warm bil, no deformities or joint swelling noted.   Neuro: alert, no focal deficits noted.    Skin: Warm, no lesions or rashes         Assessment & Plan:   Cough Cyclical cough on the basis of reflux and postnasal drip syndrome Plan Continue omeprazole daily Continue nasal steroid and nasal antihistamine daily Reflux diet    Updated Medication List Outpatient Encounter Prescriptions as of 11/12/2012  Medication Sig Dispense Refill  . [DISCONTINUED] omeprazole (PRILOSEC OTC) 20 MG tablet Use one daily as needed for heartburn      . Azelastine-Fluticasone 137-50 MCG/ACT SUSP Place 1 puff into the nose 2 (two) times daily.  7.5 g  1  . omeprazole (PRILOSEC) 20 MG capsule Take 1 capsule (20 mg total) by mouth daily.  30 capsule  4  . [DISCONTINUED] fluticasone (FLONASE) 50 MCG/ACT nasal spray Place 2 sprays into the nose daily.  16 g  5   No facility-administered encounter medications on file as of 11/12/2012.

## 2012-12-23 ENCOUNTER — Telehealth: Payer: Self-pay | Admitting: Critical Care Medicine

## 2012-12-23 MED ORDER — AZELASTINE-FLUTICASONE 137-50 MCG/ACT NA SUSP: 1.0000 | Freq: Every day | NASAL | Status: DC

## 2012-12-23 MED ORDER — AZELASTINE-FLUTICASONE 137-50 MCG/ACT NA SUSP: 1.0000 | Freq: Two times a day (BID) | NASAL | Status: DC

## 2012-12-23 NOTE — Telephone Encounter (Signed)
LMTCBx1 to make sure pt wants dymista sent and not Flonase? [place] Bing, CMA

## 2012-12-23 NOTE — Telephone Encounter (Signed)
Refill sent pt is aware. [place] Bing, CMA

## 2012-12-23 NOTE — Telephone Encounter (Signed)
Spoke to the patient and they are needing the dymista called in

## 2013-01-25 ENCOUNTER — Other Ambulatory Visit: Payer: Self-pay | Admitting: *Deleted

## 2013-01-25 MED ORDER — OMEPRAZOLE 20 MG PO CPDR: 20.0000 mg | DELAYED_RELEASE_CAPSULE | Freq: Every day | ORAL | Status: DC

## 2013-03-21 ENCOUNTER — Telehealth: Payer: Self-pay | Admitting: Critical Care Medicine

## 2013-03-21 DIAGNOSIS — K219 Gastro-esophageal reflux disease without esophagitis: Secondary | ICD-10-CM

## 2013-03-21 NOTE — Telephone Encounter (Signed)
i am ok with referral to [facility],  Dr Carlean Purl or [facility] Plan  Dx Severe GERD

## 2013-03-21 NOTE — Telephone Encounter (Signed)
I spoke with pt. He is requesting a referral to GI. He stated at his last OV 11/12/12 he was told to call if he wanted this referral. He thinks a lot of his issues may be more d/t his diet issues as well. He has noticed the reflux/ cough relates to some of the foods he eats. Please advise Dr. Joya Gaskins if you have a preference of who you would like pt to see? thanks

## 2013-03-21 NOTE — Telephone Encounter (Signed)
I spoke with pt and is aware. Referral made. Nothing further needed

## 2013-04-14 ENCOUNTER — Other Ambulatory Visit: Payer: Self-pay

## 2013-04-15 ENCOUNTER — Encounter: Payer: Self-pay | Admitting: Internal Medicine

## 2013-05-16 ENCOUNTER — Encounter: Payer: Self-pay | Admitting: Internal Medicine

## 2013-05-17 ENCOUNTER — Encounter: Payer: Self-pay | Admitting: Internal Medicine

## 2013-05-17 ENCOUNTER — Ambulatory Visit (INDEPENDENT_AMBULATORY_CARE_PROVIDER_SITE_OTHER): Payer: 59 | Admitting: Internal Medicine

## 2013-05-17 VITALS — BP 150/88 | HR 88 | Ht 72.25 in | Wt 274.0 lb

## 2013-05-17 DIAGNOSIS — R05 Cough: Secondary | ICD-10-CM

## 2013-05-17 DIAGNOSIS — I10 Essential (primary) hypertension: Secondary | ICD-10-CM

## 2013-05-17 DIAGNOSIS — K219 Gastro-esophageal reflux disease without esophagitis: Secondary | ICD-10-CM

## 2013-05-17 MED ORDER — ESOMEPRAZOLE MAGNESIUM 40 MG PO PACK: 40.0000 mg | PACK | Freq: Every day | ORAL | Status: DC

## 2013-05-17 NOTE — Patient Instructions (Signed)
We have sent the following medications to your pharmacy for you to pick up at your convenience: Nexium 40 mg daily  You will be contacted about referrals to Primary care and Allergy.  A goal of 5-10% weight reduction

## 2013-05-18 ENCOUNTER — Encounter: Payer: Self-pay | Admitting: Internal Medicine

## 2013-05-18 ENCOUNTER — Telehealth: Payer: Self-pay | Admitting: Gastroenterology

## 2013-05-18 NOTE — Telephone Encounter (Signed)
lvm for pt to tell him he has been referred to [facility] and he has an appointment with Dr. Darnell Level on 07/20/2013 @ 10:30am

## 2013-05-18 NOTE — Progress Notes (Signed)
Patient ID: Sean Sharp, male   DOB: 08/03/1975, 37 y.o.   MRN: [id number] HPI: Mr. Weed is a 37 yo male with PMH of GERD, chronic cough, rhinitis, and probable hypertension who is seen on a self-referral to evaluate reflux disease and coughing. He is here alone today. He reports that he's had years of chronic coughing and has been told he has cyclical cough. He has been seen by Dr. Joya Gaskins with pulmonology. He reports cough and postnasal drainage continue to be his biggest problem. The cough is worse when he changes his environment such as walking outdoors. He has made dramatic dietary changes and is following a stricter diet. He is avoiding sodas, tomato and other acidic fruits and vegetables, and this has helped his intermittent heartburn. He feels it is also likely help his cough. He was using omeprazole 20 mg daily which he was not impressed with. He also tried lansoprazole and Zantac over-the-counter without much relief. He's recently been started on Nexium 20 mg daily and has found this to be slightly more helpful. Denies dysphagia or odynophagia. No abdominal pain. No nausea or vomiting. No change in bowel habits. No blood in his stool or melena.  He wonders whether or not he did have food allergies which are driving his cough. He has tried Dimysta, under the direction of Dr. Joya Gaskins which he felt helped some. He does report after frequent coughing he developed sore and dry throat. He also reports the only medication that has definitely improved his cough was 2 rounds of prednisone. He knows that prednisone is not a viable solution long-term.  He has been monitoring his blood pressures at home and noted them to be slightly elevated. He has not seen primary care about this and is in between primary care at present (was seeing urgent care PRN)  Past Medical History  Diagnosis Date  . Cough   . GERD (gastroesophageal reflux disease)   . Allergic rhinitis   . Hydrocele   . Edema leg     left     Past Surgical History  Procedure Laterality Date  . Skin graft      on left finger  . Wisdom tooth extraction      Current Outpatient Prescriptions  Medication Sig Dispense Refill  . AMBULATORY NON FORMULARY MEDICATION OTC Fluid pill 1 tab by mouth every day      . Azelastine-Fluticasone 137-50 MCG/ACT SUSP Place 1 puff into the nose daily.  23 g  6  . esomeprazole (NEXIUM) 40 MG packet Take 40 mg by mouth daily before breakfast.  30 each  12   No current facility-administered medications for this visit.    No Known Allergies  Family History  Problem Relation Age of Onset  . Sarcoidosis Father     ?  Marland Kitchen Cancer Paternal Grandfather     ?  Marland Kitchen Other Mother     colon issues-portion of colon removed  . Lactose intolerance Mother   . Lactose intolerance Father     History  Substance Use Topics  . Smoking status: Former Smoker -- 0.30 packs/day for 1.5 years    Types: Cigarettes    Quit date: 06/10/1995  . Smokeless tobacco: Never Used  . Alcohol Use: Yes     Comment: occasional    ROS: As per history of present illness, otherwise negative  BP 150/88  Pulse 88  Ht 6' 0.25" (1.835 m)  Wt 274 lb (124.286 kg)  BMI 36.91 kg/m2 Constitutional: Well-developed and well-nourished.  No distress. HEENT: Normocephalic and atraumatic. Oropharynx is clear and moist. No oropharyngeal exudate. Conjunctivae are normal.  No scleral icterus. Neck: Neck supple. Trachea midline. Cardiovascular: Normal rate, regular rhythm and intact distal pulses. No M/R/G Pulmonary/chest: Effort normal and breath sounds normal. No wheezing, rales or rhonchi. Abdominal: Soft, nontender, nondistended. Bowel sounds active throughout. There are no masses palpable. No hepatosplenomegaly. Extremities: Hyperpigmentation with firm skin consistent with venous stasis bilateral shins, slight asymmetric trace to 1+ edema left greater than right Lymphadenopathy: No cervical adenopathy noted. Neurological: Alert  and oriented to person place and time. Skin: Skin is warm and dry. No rashes noted. Psychiatric: Normal mood and affect. Behavior is normal.  ASSESSMENT/PLAN: 37 yo male with PMH of GERD, chronic cough, rhinitis, and probable hypertension who is seen on a self-referral to evaluate reflux disease and coughing.  1.  Cough/GERD -- I do think  GERD could be contributing to his cough but I do not feel it is the only factor. I recommended increasing his Nexium to 40 mg once daily. He is instructed to take this 30 minutes to one hour before his first meal of the day. If necessary he can do this to 30 minutes to one hour before his last meal of the day if he is having breakthrough symptoms at night. I have recommended for him to continue with the GERD diet and GERD hygiene. I also recommended weight loss which I think would help with his GERD and blood pressure with ideal target to lose 5-10% of his current body weight.  2.  Cough, improved with prednisone -- he inquired about allergy testing, which is reasonable.  Referral to allergy/immunology  3.  HTN -- PCP referral for [facility].  He will continue to monitor and log BPs at home periodically

## 2013-05-31 ENCOUNTER — Ambulatory Visit: Payer: 59 | Admitting: Family Medicine

## 2013-06-01 ENCOUNTER — Encounter: Payer: Self-pay | Admitting: Family Medicine

## 2013-06-01 ENCOUNTER — Ambulatory Visit (INDEPENDENT_AMBULATORY_CARE_PROVIDER_SITE_OTHER): Payer: 59 | Admitting: Family Medicine

## 2013-06-01 VITALS — BP 145/92 | HR 72 | Temp 98.9°F | Resp 18 | Ht 73.0 in | Wt 269.0 lb

## 2013-06-01 DIAGNOSIS — K219 Gastro-esophageal reflux disease without esophagitis: Secondary | ICD-10-CM

## 2013-06-01 DIAGNOSIS — J309 Allergic rhinitis, unspecified: Secondary | ICD-10-CM

## 2013-06-01 DIAGNOSIS — I872 Venous insufficiency (chronic) (peripheral): Secondary | ICD-10-CM

## 2013-06-01 DIAGNOSIS — I10 Essential (primary) hypertension: Secondary | ICD-10-CM

## 2013-06-01 MED ORDER — HYDROCHLOROTHIAZIDE 25 MG PO TABS: 25.0000 mg | ORAL_TABLET | Freq: Every day | ORAL | Status: DC

## 2013-06-01 NOTE — Assessment & Plan Note (Signed)
Asymmetric but not dramatically so (L>R). Discussed option of doing LE venous u/s today but we decided to hold off on this for now. Discussed dx, emphasized Na limitation, elevation of legs prn, potential need for compression hose in the future.

## 2013-06-01 NOTE — Assessment & Plan Note (Signed)
With PND cough. The current medical regimen is effective;  continue present plan and medications.

## 2013-06-01 NOTE — Assessment & Plan Note (Signed)
Improving since recent change to nexium 49m by Dr. PHilarie Fredrickson

## 2013-06-01 NOTE — Assessment & Plan Note (Signed)
Start HCTZ 25 mg qd.  Therapeutic expectations and side effect profile of medication discussed today.  Patient's questions answered. Check BMET at f/u visit in 2 wks.

## 2013-06-01 NOTE — Progress Notes (Addendum)
Office Note 06/01/2013  CC:  Chief Complaint  Patient presents with  . Establish Care    transfer from [place] [facility]    HPI:  Sean Sharp is a 37 y.o. Black male who is here to establish care. Patient's most recent primary MD: [employer]. Old records in [facility] EMR were reviewed prior to or during today's visit.  Describes history of having a chronic cough starting about 3 yrs ago.  A CXR was done that showed changes possibly suggestive of sarcoidosis, pulm referral was made and CT scan showed no sign of sarcoidosis.  Ultimately a dx of rhinitis w/PND was made and he improved on approp nasal spray. Most recently, GERD was also implicated as a contributor to his cough and he was switched from his OTC PPI to 40 mg nexium by GI MD, Dr. Hilarie Fredrickson.  He says he notices a significant improvement in just the 4-5 days he has been on this med.  Past Medical History  Diagnosis Date  . Cough     GERD + rhinitis and PND  . GERD (gastroesophageal reflux disease)   . Allergic rhinitis   . Hydrocele     left  . Peripheral edema     left leg>right leg  . Elevated blood pressure reading without diagnosis of hypertension     Past Surgical History  Procedure Laterality Date  . Skin graft      on left finger  . Wisdom tooth extraction      Family History  Problem Relation Age of Onset  . Sarcoidosis Father     ?  Marland Kitchen Lactose intolerance Father   . Cancer Paternal Grandfather     ?  Marland Kitchen Other Mother     colon issues-portion of colon removed  . Lactose intolerance Mother     History   Social History  . Marital Status: Married    Spouse Name: N/A    Number of Children: 2  . Years of Education: N/A   Occupational History  . electrician    Social History Main Topics  . Smoking status: Former Smoker -- 0.30 packs/day for 1.5 years    Types: Cigarettes    Quit date: 06/10/1995  . Smokeless tobacco: Never Used  . Alcohol Use: Yes     Comment: occasional  . Drug  Use: No  . Sexual Activity: Not on file   Other Topics Concern  . Not on file   Social History Narrative   Married, 2 children (47 and 40 y/o).   Occupation: Clinical biochemist Editor, commissioning).   Orig from [facility].   No Tobacco.  Rare alcohol.  No drugs.   Enjoys fishing, Environmental health practitioner, wood working, Research officer, trade union.          Outpatient Encounter Prescriptions as of 06/01/2013  Medication Sig  . Azelastine-Fluticasone 137-50 MCG/ACT SUSP Place 1 puff into the nose daily.  Marland Kitchen esomeprazole (NEXIUM) 40 MG packet Take 40 mg by mouth daily before breakfast.  . hydrochlorothiazide (HYDRODIURIL) 25 MG tablet Take 1 tablet (25 mg total) by mouth daily.  . [DISCONTINUED] AMBULATORY NON FORMULARY MEDICATION OTC Fluid pill 1 tab by mouth every day    No Known Allergies  ROS ROS PE; Blood pressure 145/92, pulse 72, temperature 98.9 F (37.2 C), temperature source Temporal, resp. rate 18, height _0  (1.854 m), weight 269 lb (122.018 kg), SpO2 98.00%. EXAM Pertinent labs:  none  ASSESSMENT AND PLAN:   New pt: obtain old PMD records.  HTN (  hypertension), benign Start HCTZ 25 mg qd.  Therapeutic expectations and side effect profile of medication discussed today.  Patient's questions answered. Check BMET at f/u visit in 2 wks.  GERD Improving since recent change to nexium 56m by Dr. PHilarie Fredrickson  ALLERGIC RHINITIS With PND cough. The current medical regimen is effective;  continue present plan and medications.   Chronic venous insufficiency Asymmetric but not dramatically so (L>R). Discussed option of doing LE venous u/s today but we decided to hold off on this for now. Discussed dx, emphasized Na limitation, elevation of legs prn, potential need for compression hose in the future.  An After Visit Summary was printed and given to the patient.  Pt declined flu vaccine today.  Return in about 2 weeks (around 06/15/2013) for f/u HTN and venous insuff.

## 2013-06-09 DIAGNOSIS — R490 Dysphonia: Secondary | ICD-10-CM

## 2013-06-09 DIAGNOSIS — J381 Polyp of vocal cord and larynx: Secondary | ICD-10-CM

## 2013-06-09 HISTORY — DX: Polyp of vocal cord and larynx: J38.1

## 2013-06-09 HISTORY — DX: Dysphonia: R49.0

## 2013-06-17 ENCOUNTER — Encounter: Payer: Self-pay | Admitting: Family Medicine

## 2013-06-17 ENCOUNTER — Ambulatory Visit (INDEPENDENT_AMBULATORY_CARE_PROVIDER_SITE_OTHER): Payer: 59 | Admitting: Family Medicine

## 2013-06-17 VITALS — BP 154/88 | HR 76 | Temp 99.2°F | Resp 18 | Ht 73.0 in | Wt 272.0 lb

## 2013-06-17 DIAGNOSIS — I10 Essential (primary) hypertension: Secondary | ICD-10-CM

## 2013-06-17 DIAGNOSIS — R609 Edema, unspecified: Secondary | ICD-10-CM

## 2013-06-17 DIAGNOSIS — I872 Venous insufficiency (chronic) (peripheral): Secondary | ICD-10-CM

## 2013-06-17 MED ORDER — IRBESARTAN 150 MG PO TABS: 150.0000 mg | ORAL_TABLET | Freq: Every day | ORAL | Status: DC

## 2013-06-17 NOTE — Progress Notes (Signed)
OFFICE NOTE  06/17/2013  CC:  Chief Complaint  Patient presents with  . Follow-up     HPI: Patient is a 38 y.o. African-American male who is here for 2 wk f/u HTN and LE venous insufficiency. No bp checks done since last visit.  Very hectic work schedule. LE swelling unchanged.  No change in sodium intake due to lots of travel/limited food choices. Denies adverse side effect from HCTZ.  Denies HA, palpitations, CP, SOB, or dizziness.  Pertinent PMH:  Past Medical History  Diagnosis Date  . Cough     GERD + rhinitis and PND  . GERD (gastroesophageal reflux disease)   . Allergic rhinitis   . Hydrocele     left  . Peripheral edema     left leg>right leg  . HTN (hypertension)     MEDS:  Outpatient Prescriptions Prior to Visit  Medication Sig Dispense Refill  . Azelastine-Fluticasone 137-50 MCG/ACT SUSP Place 1 puff into the nose daily.  23 g  6  . esomeprazole (NEXIUM) 40 MG packet Take 40 mg by mouth daily before breakfast.  30 each  12  . hydrochlorothiazide (HYDRODIURIL) 25 MG tablet Take 1 tablet (25 mg total) by mouth daily.  30 tablet  6   No facility-administered medications prior to visit.    PE: Blood pressure 154/88, pulse 76, temperature 99.2 F (37.3 C), temperature source Temporal, resp. rate 18, height _0  (1.854 m), weight 272 lb (123.378 kg), SpO2 99.00%. Gen: Alert, well appearing.  Patient is oriented to person, place, time, and situation. CV: RRR, no m/r/g.   LUNGS: CTA bilat, nonlabored resps, good aeration in all lung fields. EXT: 3+ pitting edema in bilat LE's below knees, with diffuse dry skin  IMPRESSION AND PLAN:  HTN (hypertension), benign Not ideal control. Add irbesartan 119m qd.  Therapeutic expectations and side effect profile of medication discussed today.  Patient's questions answered. Diet and exercise importance was reiterated.  Chronic venous insufficiency Emphasized importance of low Na diet again, elevation prn. Pt expresses  desire to go ahead with obtaining bilat LE venous doppler to r/o DVT.   An After Visit Summary was printed and given to the patient.  FOLLOW UP: 1 mo

## 2013-06-17 NOTE — Progress Notes (Signed)
Pre visit review using our clinic review tool, if applicable. No additional management support is needed unless otherwise documented below in the visit note.

## 2013-06-19 NOTE — Assessment & Plan Note (Addendum)
Not ideal control. Add irbesartan 128m qd.  Therapeutic expectations and side effect profile of medication discussed today.  Patient's questions answered. Diet and exercise importance was reiterated. Obtain BMET at next f/u in 1 mo.

## 2013-06-19 NOTE — Assessment & Plan Note (Signed)
Emphasized importance of low Na diet again, elevation prn. Pt expresses desire to go ahead with obtaining bilat LE venous doppler to r/o DVT.

## 2013-06-20 ENCOUNTER — Telehealth: Payer: Self-pay | Admitting: Internal Medicine

## 2013-06-21 MED ORDER — ESOMEPRAZOLE MAGNESIUM 40 MG PO CPDR: 40.0000 mg | DELAYED_RELEASE_CAPSULE | Freq: Every day | ORAL | Status: DC

## 2013-06-21 NOTE — Telephone Encounter (Signed)
[facility]

## 2013-06-29 ENCOUNTER — Ambulatory Visit (HOSPITAL_COMMUNITY): Payer: 59 | Attending: Family Medicine

## 2013-06-29 DIAGNOSIS — I10 Essential (primary) hypertension: Secondary | ICD-10-CM | POA: Insufficient documentation

## 2013-06-29 DIAGNOSIS — R609 Edema, unspecified: Secondary | ICD-10-CM

## 2013-06-29 DIAGNOSIS — Z87891 Personal history of nicotine dependence: Secondary | ICD-10-CM | POA: Insufficient documentation

## 2013-06-29 DIAGNOSIS — R599 Enlarged lymph nodes, unspecified: Secondary | ICD-10-CM | POA: Insufficient documentation

## 2013-07-01 ENCOUNTER — Ambulatory Visit: Payer: 59 | Admitting: Family Medicine

## 2013-07-01 ENCOUNTER — Ambulatory Visit: Payer: 59 | Admitting: Critical Care Medicine

## 2013-07-08 ENCOUNTER — Encounter: Payer: Self-pay | Admitting: Family Medicine

## 2013-07-15 ENCOUNTER — Ambulatory Visit: Payer: 59 | Admitting: Family Medicine

## 2013-07-20 ENCOUNTER — Ambulatory Visit: Payer: 59 | Admitting: Family Medicine

## 2013-07-20 ENCOUNTER — Other Ambulatory Visit: Payer: Self-pay | Admitting: Family Medicine

## 2013-07-20 MED ORDER — HYDROCHLOROTHIAZIDE 25 MG PO TABS: 25.0000 mg | ORAL_TABLET | Freq: Every day | ORAL | Status: DC

## 2013-07-29 ENCOUNTER — Ambulatory Visit: Payer: 59 | Admitting: Family Medicine

## 2013-07-29 ENCOUNTER — Ambulatory Visit: Payer: 59 | Admitting: Critical Care Medicine

## 2013-08-05 ENCOUNTER — Ambulatory Visit: Payer: 59 | Admitting: Family Medicine

## 2013-08-29 ENCOUNTER — Telehealth: Payer: Self-pay | Admitting: Family Medicine

## 2013-08-29 MED ORDER — IRBESARTAN 150 MG PO TABS: 150.0000 mg | ORAL_TABLET | Freq: Every day | ORAL | Status: DC

## 2013-08-29 NOTE — Telephone Encounter (Signed)
Patients pharmacy [facility] wanting status of avapro refill.  I never received refill request but patient canceled and didn't show for her last few appointments.  Patient needs an office visit.   Please contact her.

## 2013-08-29 NOTE — Telephone Encounter (Signed)
SW patient. He scheduled an appt for Friday. He mentioned that his work schedule changes a lot so he has a hard time scheduling & keeping an appointment.

## 2013-08-29 NOTE — Telephone Encounter (Signed)
I sent 30 day supply of avapro to pharmacy.  No refills until seen in office.

## 2013-09-02 ENCOUNTER — Encounter: Payer: Self-pay | Admitting: Family Medicine

## 2013-09-02 ENCOUNTER — Ambulatory Visit (INDEPENDENT_AMBULATORY_CARE_PROVIDER_SITE_OTHER): Payer: 59 | Admitting: Family Medicine

## 2013-09-02 VITALS — BP 144/86 | HR 69 | Temp 98.1°F | Resp 18 | Ht 73.0 in | Wt 268.0 lb

## 2013-09-02 DIAGNOSIS — I10 Essential (primary) hypertension: Secondary | ICD-10-CM

## 2013-09-02 LAB — BASIC METABOLIC PANEL
BUN: 15 mg/dL (ref 6–23)
CO2: 27 mEq/L (ref 19–32)
CREATININE: 1 mg/dL (ref 0.4–1.5)
Calcium: 9.3 mg/dL (ref 8.4–10.5)
Chloride: 102 mEq/L (ref 96–112)
GFR: 105.08 mL/min (ref >60.00)
Glucose, Bld: 90 mg/dL (ref 70–99)
Potassium: 4.3 mEq/L (ref 3.5–5.1)
Sodium: 134 mEq/L — ABNORMAL LOW (ref 135–145)

## 2013-09-02 MED ORDER — IRBESARTAN 150 MG PO TABS: 150.0000 mg | ORAL_TABLET | Freq: Every day | ORAL | Status: DC

## 2013-09-02 NOTE — Progress Notes (Signed)
Pre visit review using our clinic review tool, if applicable. No additional management support is needed unless otherwise documented below in the visit note.

## 2013-09-02 NOTE — Progress Notes (Signed)
OFFICE NOTE  09/02/2013  CC:  Chief Complaint  Patient presents with  . Follow-up     HPI: Patient is a 38 y.o. African-American male who is here for 2 mo f/u HTN. Irbesartan 118m qd added last visit--ran out 2 d/a.  No side effects. Compliant with meds otherwise.  No home bp checks.  Doing well regarding reflux sx's (hx of dry cough mostly) on nexium.   Pertinent PMH:  Past medical, surgical, social, and family history reviewed and no changes are noted since last office visit.  MEDS:  Outpatient Prescriptions Prior to Visit  Medication Sig Dispense Refill  . Azelastine-Fluticasone 137-50 MCG/ACT SUSP Place 1 puff into the nose daily.  23 g  6  . esomeprazole (NEXIUM) 40 MG capsule Take 1 capsule (40 mg total) by mouth daily at 12 noon.  30 capsule  6  . hydrochlorothiazide (HYDRODIURIL) 25 MG tablet Take 1 tablet (25 mg total) by mouth daily.  90 tablet  1  . irbesartan (AVAPRO) 150 MG tablet Take 1 tablet (150 mg total) by mouth daily.  30 tablet  0   No facility-administered medications prior to visit.    PE: Blood pressure 144/86, pulse 69, temperature 98.1 F (36.7 C), temperature source Temporal, resp. rate 18, height _0  (1.854 m), weight 268 lb (121.564 kg), SpO2 99.00%. Gen: Alert, well appearing.  Patient is oriented to person, place, time, and situation. CV: RRR, no m/r/g.   LUNGS: CTA bilat, nonlabored resps, good aeration in all lung fields.   IMPRESSION AND PLAN:  1) HTN: stable.  Continue current meds. BMET today.  2) GERD: stable.  Continue nexium 419mqd.  An After Visit Summary was printed and given to the patient.  FOLLOW UP: 6 mo

## 2013-09-03 ENCOUNTER — Telehealth: Payer: Self-pay | Admitting: Family Medicine

## 2013-09-03 NOTE — Telephone Encounter (Signed)
Relevant patient education assigned to patient using Emmi.

## 2013-11-03 ENCOUNTER — Encounter (HOSPITAL_COMMUNITY): Payer: Self-pay | Admitting: Emergency Medicine

## 2013-11-03 ENCOUNTER — Emergency Department (HOSPITAL_COMMUNITY)
Admission: EM | Admit: 2013-11-03 | Discharge: 2013-11-03 | Disposition: A | Discharge status: Home / Self Care | Payer: 59 | Attending: Emergency Medicine | Admitting: Emergency Medicine

## 2013-11-03 DIAGNOSIS — Z8709 Personal history of other diseases of the respiratory system: Secondary | ICD-10-CM | POA: Insufficient documentation

## 2013-11-03 DIAGNOSIS — Z79899 Other long term (current) drug therapy: Secondary | ICD-10-CM | POA: Insufficient documentation

## 2013-11-03 DIAGNOSIS — R630 Anorexia: Secondary | ICD-10-CM | POA: Insufficient documentation

## 2013-11-03 DIAGNOSIS — I10 Essential (primary) hypertension: Secondary | ICD-10-CM | POA: Insufficient documentation

## 2013-11-03 DIAGNOSIS — Z87891 Personal history of nicotine dependence: Secondary | ICD-10-CM | POA: Insufficient documentation

## 2013-11-03 DIAGNOSIS — R110 Nausea: Secondary | ICD-10-CM | POA: Insufficient documentation

## 2013-11-03 DIAGNOSIS — M62830 Muscle spasm of back: Secondary | ICD-10-CM

## 2013-11-03 DIAGNOSIS — M5380 Other specified dorsopathies, site unspecified: Secondary | ICD-10-CM | POA: Insufficient documentation

## 2013-11-03 DIAGNOSIS — E860 Dehydration: Secondary | ICD-10-CM | POA: Insufficient documentation

## 2013-11-03 DIAGNOSIS — R109 Unspecified abdominal pain: Secondary | ICD-10-CM | POA: Insufficient documentation

## 2013-11-03 DIAGNOSIS — Z87448 Personal history of other diseases of urinary system: Secondary | ICD-10-CM | POA: Insufficient documentation

## 2013-11-03 DIAGNOSIS — K219 Gastro-esophageal reflux disease without esophagitis: Secondary | ICD-10-CM | POA: Insufficient documentation

## 2013-11-03 LAB — BASIC METABOLIC PANEL
BUN: 19 mg/dL (ref 6–23)
CALCIUM: 9.4 mg/dL (ref 8.4–10.5)
CO2: 25 meq/L (ref 19–32)
CREATININE: 1.64 mg/dL — AB (ref 0.50–1.35)
Chloride: 98 mEq/L (ref 96–112)
GFR, EST AFRICAN AMERICAN: 60 mL/min — AB (ref >90)
GFR, EST NON AFRICAN AMERICAN: 52 mL/min — AB (ref >90)
Glucose, Bld: 127 mg/dL — ABNORMAL HIGH (ref 70–99)
Potassium: 4.4 mEq/L (ref 3.7–5.3)
Sodium: 135 mEq/L — ABNORMAL LOW (ref 137–147)

## 2013-11-03 LAB — URINALYSIS, ROUTINE W REFLEX MICROSCOPIC
Bilirubin Urine: NEGATIVE
Glucose, UA: NEGATIVE mg/dL
Hgb urine dipstick: NEGATIVE
KETONES UR: NEGATIVE mg/dL
LEUKOCYTES UA: NEGATIVE
NITRITE: NEGATIVE
PROTEIN: NEGATIVE mg/dL
Specific Gravity, Urine: 1.027 (ref 1.005–1.030)
UROBILINOGEN UA: 1 mg/dL (ref 0.0–1.0)
pH: 5.5 (ref 5.0–8.0)

## 2013-11-03 LAB — CBC WITH DIFFERENTIAL/PLATELET
BASOS ABS: 0 10*3/uL (ref 0.0–0.1)
Basophils Relative: 0 % (ref 0–1)
EOS PCT: 0 % (ref 0–5)
Eosinophils Absolute: 0 10*3/uL (ref 0.0–0.7)
HCT: 41.7 % (ref 39.0–52.0)
Hemoglobin: 13.4 g/dL (ref 13.0–17.0)
LYMPHS PCT: 6 % — AB (ref 12–46)
Lymphs Abs: 0.5 10*3/uL — ABNORMAL LOW (ref 0.7–4.0)
MCH: 22.6 pg — ABNORMAL LOW (ref 26.0–34.0)
MCHC: 32.1 g/dL (ref 30.0–36.0)
MCV: 70.2 fL — AB (ref 78.0–100.0)
Monocytes Absolute: 0.5 10*3/uL (ref 0.1–1.0)
Monocytes Relative: 6 % (ref 3–12)
NEUTROS PCT: 88 % — AB (ref 43–77)
Neutro Abs: 8 10*3/uL — ABNORMAL HIGH (ref 1.7–7.7)
PLATELETS: 254 10*3/uL (ref 150–400)
RBC: 5.94 MIL/uL — ABNORMAL HIGH (ref 4.22–5.81)
RDW: 16.2 % — AB (ref 11.5–15.5)
WBC: 9.1 10*3/uL (ref 4.0–10.5)

## 2013-11-03 MED ORDER — CYCLOBENZAPRINE HCL 10 MG PO TABS: 10.0000 mg | ORAL_TABLET | Freq: Two times a day (BID) | ORAL | Status: DC | PRN

## 2013-11-03 MED ORDER — OXYCODONE-ACETAMINOPHEN 5-325 MG PO TABS
1.0000 | ORAL_TABLET | Freq: Once | ORAL | Status: AC
  Administered 2013-11-03: 1 via ORAL
  Filled 2013-11-03: qty 1

## 2013-11-03 MED ORDER — KETOROLAC TROMETHAMINE 30 MG/ML IJ SOLN
30.0000 mg | Freq: Once | INTRAMUSCULAR | Status: AC
  Administered 2013-11-03: 30 mg via INTRAVENOUS
  Filled 2013-11-03: qty 1

## 2013-11-03 MED ORDER — ONDANSETRON HCL 4 MG/2ML IJ SOLN
4.0000 mg | Freq: Once | INTRAMUSCULAR | Status: AC
  Administered 2013-11-03: 4 mg via INTRAVENOUS
  Filled 2013-11-03: qty 2

## 2013-11-03 MED ORDER — SODIUM CHLORIDE 0.9 % IV BOLUS (SEPSIS)
1000.0000 mL | Freq: Once | INTRAVENOUS | Status: AC
  Administered 2013-11-03: 1000 mL via INTRAVENOUS

## 2013-11-03 NOTE — ED Notes (Signed)
Pt unable to obtain urine sample at this time

## 2013-11-03 NOTE — ED Provider Notes (Signed)
CSN: [id number]     Arrival date & time 11/03/13  1350 History   First MD Initiated Contact with Patient 11/03/13 [phone]     Chief Complaint  Patient presents with  . Back Pain     (Consider location/radiation/quality/duration/timing/severity/associated sxs/prior Treatment) Patient is a 38 y.o. male presenting with flank pain. The history is provided by the patient.  Flank Pain This is a new problem. The current episode started today. The problem occurs constantly. The problem has been unchanged. Associated symptoms include anorexia and nausea. Pertinent negatives include no abdominal pain, coughing, diaphoresis, fever, rash or vomiting. Nothing aggravates the symptoms. Treatments tried: percocet. The treatment provided moderate relief.    Past Medical History  Diagnosis Date  . Cough     GERD + rhinitis and PND  . GERD (gastroesophageal reflux disease)   . Allergic rhinitis   . Hydrocele     left  . Peripheral edema     left leg>right leg  . HTN (hypertension)    Past Surgical History  Procedure Laterality Date  . Skin graft  2010    on left index finger  . Wisdom tooth extraction  2005   Family History  Problem Relation Age of Onset  . Sarcoidosis Father     ?  Marland Kitchen Lactose intolerance Father   . Cancer Paternal Grandfather     ?  Marland Kitchen Other Mother     colon issues-portion of colon removed  . Lactose intolerance Mother    History  Substance Use Topics  . Smoking status: Former Smoker -- 0.30 packs/day for 1.5 years    Types: Cigarettes    Quit date: 06/10/1995  . Smokeless tobacco: Never Used  . Alcohol Use: Yes     Comment: occasional    Review of Systems  Constitutional: Negative for fever and diaphoresis.  Respiratory: Negative for cough.   Gastrointestinal: Positive for nausea and anorexia. Negative for vomiting and abdominal pain.  Genitourinary: Positive for flank pain. Negative for dysuria, discharge and testicular pain.  Skin: Negative for rash.  All  other systems reviewed and are negative.     Allergies  Review of patient's allergies indicates no known allergies.  Home Medications   Prior to Admission medications   Medication Sig Start Date End Date Taking? Authorizing Provider  hydrochlorothiazide (HYDRODIURIL) 25 MG tablet Take 1 tablet (25 mg total) by mouth daily. 07/20/13  Yes Tammi Sou, MD  irbesartan (AVAPRO) 150 MG tablet Take 1 tablet (150 mg total) by mouth daily. 09/02/13  Yes Tammi Sou, MD  omeprazole (PRILOSEC OTC) 20 MG tablet Take 20 mg by mouth daily after breakfast.   Yes Historical Provider, MD   BP 173/87  Pulse 77  Temp(Src) 98.3 F (36.8 C) (Oral)  Resp 18  Ht _0  (1.88 m)  Wt 265 lb (120.203 kg)  BMI 34.01 kg/m2  SpO2 99% Physical Exam  Constitutional: He is oriented to person, place, and time. He appears well-developed and well-nourished. No distress.  HENT:  Head: Normocephalic and atraumatic.  Eyes: Conjunctivae are normal.  Neck: Neck supple. No tracheal deviation present.  Cardiovascular: Normal rate and regular rhythm.   Pulmonary/Chest: Effort normal. No respiratory distress.  Abdominal: Soft. Normal appearance. He exhibits no distension. There is no tenderness. There is CVA tenderness (on right).  Musculoskeletal:       Lumbar back: He exhibits no tenderness, no bony tenderness, no swelling and no deformity.  Neurological: He is alert and oriented to person,  place, and time.  Skin: Skin is warm and dry.  Psychiatric: He has a normal mood and affect.    ED Course  Procedures (including critical care time) Labs Review Labs Reviewed  BASIC METABOLIC PANEL - Abnormal; Notable for the following:    Sodium 135 (*)    Glucose, Bld 127 (*)    Creatinine, Ser 1.64 (*)    GFR calc non Af Amer 52 (*)    GFR calc Af Amer 60 (*)    All other components within normal limits  CBC WITH DIFFERENTIAL - Abnormal; Notable for the following:    RBC 5.94 (*)    MCV 70.2 (*)    MCH 22.6  (*)    RDW 16.2 (*)    Neutrophils Relative % 88 (*)    Neutro Abs 8.0 (*)    Lymphocytes Relative 6 (*)    Lymphs Abs 0.5 (*)    All other components within normal limits  URINALYSIS, ROUTINE W REFLEX MICROSCOPIC    Imaging Review No results found.   EKG Interpretation None      MDM   Final diagnoses:  Muscle spasm of back  Dehydration    38 y.o. male presents with sudden onset right lower back pain that started this morning at 10 AM. He's never had a kidney stone in the past. He describes the pain as sharp in nature and as if he was laying on a rock. The patient has been constant since onset and was improved slightly by Percocet in triage. Urine was collected to screen for blood, Toradol, IV fluid, Zofran offered for comfort measures as the patient is currently complaining of nausea and some refractory pain. Plan for CT scan of abdomen and pelvis to look for stone if the patient's urine has evident blood.  Patient without evidence of blood in the urine, do not believe this is kidney stone as it has completely resolved following the first round of pain medications and anti-inflammatories. At this time I believe the patient was mildly dehydrated as evidenced by his lab work today and he had good response to IV hydration therapy. He is provided a small course of muscle relaxants in the case of recurrent muscle spasm and was ambulatory on discharge without difficulty.    Leo Grosser, MD 11/04/13 0130

## 2013-11-03 NOTE — ED Notes (Signed)
Pain is not worse on palpation

## 2013-11-03 NOTE — ED Notes (Signed)
Patient very uncomfortable. Pt sitting on the floor in the waiting room due to pain.

## 2013-11-03 NOTE — Discharge Instructions (Signed)
Back Pain, Adult °Low back pain is very common. About 1 in 5 people have back pain. The cause of low back pain is rarely dangerous. The pain often gets better over time. About half of people with a sudden onset of back pain feel better in just 2 weeks. About 8 in 10 people feel better by 6 weeks.  °CAUSES °Some common causes of back pain include: °· Strain of the muscles or ligaments supporting the spine. °· Wear and tear (degeneration) of the spinal discs. °· Arthritis. °· Direct injury to the back. °DIAGNOSIS °Most of the time, the direct cause of low back pain is not known. However, back pain can be treated effectively even when the exact cause of the pain is unknown. Answering your caregiver's questions about your overall health and symptoms is one of the most accurate ways to make sure the cause of your pain is not dangerous. If your caregiver needs more information, he or she may order lab work or imaging tests (X-rays or MRIs). However, even if imaging tests show changes in your back, this usually does not require surgery. °HOME CARE INSTRUCTIONS °For many people, back pain returns. Since low back pain is rarely dangerous, it is often a condition that people can learn to manage on their own.  °· Remain active. It is stressful on the back to sit or stand in one place. Do not sit, drive, or stand in one place for more than 30 minutes at a time. Take short walks on level surfaces as soon as pain allows. Try to increase the length of time you walk each day. °· Do not stay in bed. Resting more than 1 or 2 days can delay your recovery. °· Do not avoid exercise or work. Your body is made to move. It is not dangerous to be active, even though your back may hurt. Your back will likely heal faster if you return to being active before your pain is gone. °· Pay attention to your body when you  bend and lift. Many people have less discomfort when lifting if they bend their knees, keep the load close to their bodies, and  avoid twisting. Often, the most comfortable positions are those that put less stress on your recovering back. °· Find a comfortable position to sleep. Use a firm mattress and lie on your side with your knees slightly bent. If you lie on your back, put a pillow under your knees. °· Only take over-the-counter or prescription medicines as directed by your caregiver. Over-the-counter medicines to reduce pain and inflammation are often the most helpful. Your caregiver may prescribe muscle relaxant drugs. These medicines help dull your pain so you can more quickly return to your normal activities and healthy exercise. °· Put ice on the injured area. °· Put ice in a plastic bag. °· Place a towel between your skin and the bag. °· Leave the ice on for 15-20 minutes, 03-04 times a day for the first 2 to 3 days. After that, ice and heat may be alternated to reduce pain and spasms. °· Ask your caregiver about trying back exercises and gentle massage. This may be of some benefit. °· Avoid feeling anxious or stressed. Stress increases muscle tension and can worsen back pain. It is important to recognize when you are anxious or stressed and learn ways to manage it. Exercise is a great option. °SEEK MEDICAL CARE IF: °· You have pain that is not relieved with rest or medicine. °· You have pain that does not improve in 1 week. °· You have new symptoms. °· You are generally not feeling well. °SEEK   IMMEDIATE MEDICAL CARE IF:   You have pain that radiates from your back into your legs.  You develop new bowel or bladder control problems.  You have unusual weakness or numbness in your arms or legs.  You develop nausea or vomiting.  You develop abdominal pain.  You feel faint. Document Released: 05/26/2005 Document Revised: 11/25/2011 Document Reviewed: 10/14/2010 Exit[employer] Patient Information 2014 [employer].  

## 2013-11-03 NOTE — ED Notes (Signed)
Pt reports right side back pain that started today around 10:00, reports n/v but denies any difficulty urinating. Denies back injury or history of back pain.

## 2013-11-04 NOTE — ED Provider Notes (Signed)
I saw and evaluated the patient, reviewed the resident's note and I agree with the findings and plan.   EKG Interpretation None      Sean Sharp is a 38 y.o. male here with acute onset R back and flank pain today. No injury. No history of kidney stones. On exam, mild R CVAT vs paralumbar tenderness. UA showed no hematuria. Labs showed Cr. 1.6 likely from dehydration. Given pain meds and IVF and felt better. Bedside US showed no obvious hydro on the R. I doubt renal colic, likely back strain. D/c home on NSAIDs, muscle relaxants.     EMERGENCY DEPARTMENT US RENAL INTERPRETATIOIN  INDICATIONS: Pain  PERFORMED BY:  Myself  IMAGES ARCHIVED?: Yes  FINDINGS: no hydro  LIMITATIONS:  Emergent Procedure  INTERPRETATION: no hydro or renal cysts   COMMENT:  normal    Wandra Arthurs, MD 11/04/13 1101

## 2014-01-24 ENCOUNTER — Telehealth: Payer: Self-pay | Admitting: Family Medicine

## 2014-01-24 NOTE — Telephone Encounter (Signed)
Patient was stung yesterday on his lip and experienced some swelling, he has never experience this in the past and wants to know if he should come in to be checked out for a possible epipen or if this seemed normal

## 2014-01-24 NOTE — Telephone Encounter (Signed)
Patient states he did not have any SOB.  He experienced lip and cheek swelling and itching all over.  Patient is working around a lot of wasps currently.  Please advise.

## 2014-01-25 NOTE — Telephone Encounter (Signed)
Reassure pt that swelling at the site of the sting and AROUND that site is normal.  As long as not tongue or throat swelling or any wheezing or chest tightness then this is a normal reaction and no need for epi-pen. I recommend he carry benadryl with him and use this 22m q6h prn for swelling if/when he gets stung again.

## 2014-01-25 NOTE — Telephone Encounter (Signed)
Left detailed message on pt's cell phone.  Okay per DPR.

## 2014-02-27 ENCOUNTER — Other Ambulatory Visit: Payer: Self-pay

## 2014-02-27 MED ORDER — HYDROCHLOROTHIAZIDE 25 MG PO TABS: 25.0000 mg | ORAL_TABLET | Freq: Every day | ORAL | Status: DC

## 2014-03-03 ENCOUNTER — Ambulatory Visit (INDEPENDENT_AMBULATORY_CARE_PROVIDER_SITE_OTHER): Payer: 59 | Admitting: Family Medicine

## 2014-03-03 ENCOUNTER — Encounter: Payer: Self-pay | Admitting: Family Medicine

## 2014-03-03 VITALS — BP 146/86 | HR 72 | Temp 97.8°F | Resp 18 | Ht 73.0 in | Wt 282.0 lb

## 2014-03-03 DIAGNOSIS — R7989 Other specified abnormal findings of blood chemistry: Secondary | ICD-10-CM

## 2014-03-03 DIAGNOSIS — I10 Essential (primary) hypertension: Secondary | ICD-10-CM

## 2014-03-03 DIAGNOSIS — R799 Abnormal finding of blood chemistry, unspecified: Secondary | ICD-10-CM

## 2014-03-03 LAB — BASIC METABOLIC PANEL
BUN: 14 mg/dL (ref 6–23)
CALCIUM: 9.4 mg/dL (ref 8.4–10.5)
CO2: 23 meq/L (ref 19–32)
Chloride: 106 mEq/L (ref 96–112)
Creatinine, Ser: 1.2 mg/dL (ref 0.4–1.5)
GFR: 88.58 mL/min (ref >60.00)
Glucose, Bld: 82 mg/dL (ref 70–99)
Potassium: 4.5 mEq/L (ref 3.5–5.1)
SODIUM: 135 meq/L (ref 135–145)

## 2014-03-03 MED ORDER — HYDROCHLOROTHIAZIDE 25 MG PO TABS: 25.0000 mg | ORAL_TABLET | Freq: Every day | ORAL | Status: DC

## 2014-03-03 NOTE — Progress Notes (Signed)
Pre visit review using our clinic review tool, if applicable. No additional management support is needed unless otherwise documented below in the visit note.

## 2014-03-03 NOTE — Progress Notes (Signed)
OFFICE VISIT  03/03/2014   CC:  Chief Complaint  Patient presents with  . Follow-up   HPI:    Patient is a 38 y.o. African-American male who presents for 6 mo f/u HTN. Not monitoring bp at home.  Past problems with inaccuracy of home measurements have jaded him. Takes meds daily--rarely forgets.  Not exercising.  Not eating DASH diet. Denies CP/SOB/palpitations/dizziness/vision problems.  Was in ED 11/03/13 for muscle pain in flank/back and he was dehydrated as indicated by elevated Cr.  Past Medical History  Diagnosis Date  . Cough     GERD + rhinitis and PND  . GERD (gastroesophageal reflux disease)   . Allergic rhinitis   . Hydrocele     left  . Peripheral edema     left leg>right leg  . HTN (hypertension)     Past Surgical History  Procedure Laterality Date  . Skin graft  2010    on left index finger  . Wisdom tooth extraction  2005    Outpatient Prescriptions Prior to Visit  Medication Sig Dispense Refill  . irbesartan (AVAPRO) 150 MG tablet Take 1 tablet (150 mg total) by mouth daily.  90 tablet  1  . hydrochlorothiazide (HYDRODIURIL) 25 MG tablet Take 1 tablet (25 mg total) by mouth daily.  90 tablet  1  . cyclobenzaprine (FLEXERIL) 10 MG tablet Take 1 tablet (10 mg total) by mouth 2 (two) times daily as needed for muscle spasms.  5 tablet  0  . omeprazole (PRILOSEC OTC) 20 MG tablet Take 20 mg by mouth daily after breakfast.       No facility-administered medications prior to visit.    No Known Allergies  ROS As per HPI  PE: Blood pressure 146/86, pulse 72, temperature 97.8 F (36.6 C), temperature source Oral, resp. rate 18, height _0  (1.854 m), weight 282 lb (127.914 kg), SpO2 95.00%. Gen: Alert, well appearing.  Patient is oriented to person, place, time, and situation. No further exam today.  LABS:  None today Recent:   Chemistry      Component Value Date/Time   NA 135* 11/03/2013 1527   K 4.4 11/03/2013 1527   CL 98 11/03/2013 1527   CO2  25 11/03/2013 1527   BUN 19 11/03/2013 1527   CREATININE 1.64* 11/03/2013 1527      Component Value Date/Time   CALCIUM 9.4 11/03/2013 1527      IMPRESSION AND PLAN:  1) HTN: fair control.  Pt wants to continue current meds/doses and try to work on [place]. Recheck BMET today.  2) GERD: some hoarse voice lately may be symptom of this.  I recommended he double his OTC nexium dose (2 of the 20 mg tabs per day) for the next 2 weeks and elevate head of bed with 2x4 or brick. Emphasized GERD diet again.  Pt declined flu vaccine today.  Spent 30 min with pt today, with >50% of this time spent in counseling and care coordination regarding the above problems.   FOLLOW UP: Return in about 6 months (around 09/01/2014) for annual CPE (fasting).

## 2014-03-24 ENCOUNTER — Other Ambulatory Visit: Payer: Self-pay

## 2014-04-05 ENCOUNTER — Telehealth: Payer: Self-pay | Admitting: Family Medicine

## 2014-04-05 DIAGNOSIS — R490 Dysphonia: Secondary | ICD-10-CM

## 2014-04-05 NOTE — Telephone Encounter (Signed)
[facility] referral ordered.

## 2014-04-05 NOTE — Telephone Encounter (Signed)
Patient's hoarseness is no better, what does Dr. Anitra Lauth recommend? Should he see a specialist?

## 2014-04-05 NOTE — Telephone Encounter (Signed)
Please advise.

## 2014-04-20 ENCOUNTER — Encounter: Payer: Self-pay | Admitting: Family Medicine

## 2014-06-05 ENCOUNTER — Other Ambulatory Visit: Payer: Self-pay

## 2014-06-05 MED ORDER — IRBESARTAN 150 MG PO TABS: 150.0000 mg | ORAL_TABLET | Freq: Every day | ORAL | Status: DC

## 2014-06-09 HISTORY — PX: OTHER SURGICAL HISTORY: SHX169

## 2014-07-20 ENCOUNTER — Other Ambulatory Visit: Payer: Self-pay | Admitting: Family Medicine

## 2014-07-20 ENCOUNTER — Telehealth: Payer: Self-pay | Admitting: Family Medicine

## 2014-07-20 DIAGNOSIS — R490 Dysphonia: Secondary | ICD-10-CM

## 2014-07-20 NOTE — Telephone Encounter (Signed)
Please advise.

## 2014-07-20 NOTE — Telephone Encounter (Signed)
As per [facility]'s office note from 04/2014, the next step is referral to Laryngology at [facility] [place] I'll enter order.

## 2014-07-20 NOTE — Telephone Encounter (Signed)
Patient is requesting a CB from Dr. Anitra Lauth, he is still hoarse. He went to the ENT specialist that told him there is nothing he can do. What does Dr. Anitra Lauth recommend as patient's next step?

## 2014-07-24 NOTE — Telephone Encounter (Signed)
Referral complete.

## 2014-07-28 DIAGNOSIS — R490 Dysphonia: Secondary | ICD-10-CM | POA: Insufficient documentation

## 2014-07-28 DIAGNOSIS — K219 Gastro-esophageal reflux disease without esophagitis: Secondary | ICD-10-CM | POA: Insufficient documentation

## 2014-08-02 ENCOUNTER — Encounter: Payer: Self-pay | Admitting: Family Medicine

## 2014-08-25 ENCOUNTER — Other Ambulatory Visit (INDEPENDENT_AMBULATORY_CARE_PROVIDER_SITE_OTHER): Payer: 59

## 2014-08-25 DIAGNOSIS — Z125 Encounter for screening for malignant neoplasm of prostate: Secondary | ICD-10-CM

## 2014-08-25 DIAGNOSIS — Z0000 Encounter for general adult medical examination without abnormal findings: Secondary | ICD-10-CM

## 2014-08-25 LAB — CBC WITH DIFFERENTIAL/PLATELET
Basophils Absolute: 0 10*3/uL (ref 0.0–0.1)
Basophils Relative: 0.1 % (ref 0.0–3.0)
EOS PCT: 1.3 % (ref 0.0–5.0)
Eosinophils Absolute: 0.1 10*3/uL (ref 0.0–0.7)
HCT: 42.1 % (ref 39.0–52.0)
HEMOGLOBIN: 13.6 g/dL (ref 13.0–17.0)
Lymphocytes Relative: 7.5 % — ABNORMAL LOW (ref 12.0–46.0)
Lymphs Abs: 0.4 10*3/uL — ABNORMAL LOW (ref 0.7–4.0)
MCHC: 32.2 g/dL (ref 30.0–36.0)
MCV: 68.4 fl — ABNORMAL LOW (ref 78.0–100.0)
Monocytes Absolute: 0.6 10*3/uL (ref 0.1–1.0)
Monocytes Relative: 11.2 % (ref 3.0–12.0)
NEUTROS ABS: 4.2 10*3/uL (ref 1.4–7.7)
NEUTROS PCT: 79.9 % — AB (ref 43.0–77.0)
Platelets: 226 10*3/uL (ref 150.0–400.0)
RBC: 6.16 Mil/uL — ABNORMAL HIGH (ref 4.22–5.81)
RDW: 16.7 % — ABNORMAL HIGH (ref 11.5–15.5)
WBC: 5.2 10*3/uL (ref 4.0–10.5)

## 2014-08-25 LAB — COMPREHENSIVE METABOLIC PANEL
ALT: 20 U/L (ref 0–53)
AST: 19 U/L (ref 0–37)
Albumin: 4 g/dL (ref 3.5–5.2)
Alkaline Phosphatase: 54 U/L (ref 39–117)
BUN: 25 mg/dL — AB (ref 6–23)
CHLORIDE: 102 meq/L (ref 96–112)
CO2: 27 mEq/L (ref 19–32)
Calcium: 9.4 mg/dL (ref 8.4–10.5)
Creatinine, Ser: 1.27 mg/dL (ref 0.40–1.50)
GFR: 81.18 mL/min (ref >60.00)
GLUCOSE: 144 mg/dL — AB (ref 70–99)
Potassium: 4 mEq/L (ref 3.5–5.1)
Sodium: 135 mEq/L (ref 135–145)
Total Bilirubin: 0.6 mg/dL (ref 0.2–1.2)
Total Protein: 7.1 g/dL (ref 6.0–8.3)

## 2014-08-25 LAB — LIPID PANEL
CHOL/HDL RATIO: 5
CHOLESTEROL: 146 mg/dL (ref 0–200)
HDL: 30.5 mg/dL — ABNORMAL LOW (ref >39.00)
LDL CALC: 104 mg/dL — AB (ref 0–99)
NonHDL: 115.5
Triglycerides: 59 mg/dL (ref 0.0–149.0)
VLDL: 11.8 mg/dL (ref 0.0–40.0)

## 2014-08-25 LAB — TSH: TSH: 1.72 u[IU]/mL (ref 0.35–4.50)

## 2014-08-25 LAB — PSA: PSA: 0.79 ng/mL (ref 0.10–4.00)

## 2014-09-01 ENCOUNTER — Encounter: Payer: 59 | Admitting: Family Medicine

## 2014-09-02 ENCOUNTER — Other Ambulatory Visit: Payer: Self-pay | Admitting: Internal Medicine

## 2014-09-04 ENCOUNTER — Other Ambulatory Visit: Payer: Self-pay

## 2014-09-04 MED ORDER — HYDROCHLOROTHIAZIDE 25 MG PO TABS: 25.0000 mg | ORAL_TABLET | Freq: Every day | ORAL | Status: DC

## 2014-09-04 NOTE — Telephone Encounter (Signed)
Please Advise Refill Request? Refill request for- Hydrochlorithiazide 25 mg Tasb Last filled by MD on - 03/03/14 Last Appt - 03/03/14        Next Appt - 09/15/14 Pharmacy- [place], [place]

## 2014-09-05 ENCOUNTER — Encounter: Payer: Self-pay | Admitting: Family Medicine

## 2014-09-05 ENCOUNTER — Other Ambulatory Visit: Payer: Self-pay | Admitting: Family Medicine

## 2014-09-05 DIAGNOSIS — R718 Other abnormality of red blood cells: Secondary | ICD-10-CM

## 2014-09-08 ENCOUNTER — Other Ambulatory Visit: Payer: 59

## 2014-09-12 DIAGNOSIS — J383 Other diseases of vocal cords: Secondary | ICD-10-CM | POA: Insufficient documentation

## 2014-09-15 ENCOUNTER — Encounter: Payer: 59 | Admitting: Family Medicine

## 2014-10-02 ENCOUNTER — Encounter: Payer: Self-pay | Admitting: Family Medicine

## 2014-10-06 ENCOUNTER — Telehealth: Payer: Self-pay | Admitting: Family Medicine

## 2014-10-06 ENCOUNTER — Ambulatory Visit: Payer: 59 | Admitting: Family Medicine

## 2014-10-06 MED ORDER — IRBESARTAN 150 MG PO TABS: 150.0000 mg | ORAL_TABLET | Freq: Every day | ORAL | Status: DC

## 2014-10-06 MED ORDER — HYDROCHLOROTHIAZIDE 25 MG PO TABS: 25.0000 mg | ORAL_TABLET | Freq: Every day | ORAL | Status: DC

## 2014-10-06 NOTE — Telephone Encounter (Signed)
Left message for pt, I sent HCTZ [facility] for [facility] supply.

## 2014-10-06 NOTE — Telephone Encounter (Signed)
Pt arrived late for appt. Has not had BP meds for 2 weeks. Can some meds be called in until next Friday's (May 6) appt?  Please let him know.

## 2014-10-13 ENCOUNTER — Encounter: Payer: Self-pay | Admitting: Family Medicine

## 2014-10-13 ENCOUNTER — Ambulatory Visit (INDEPENDENT_AMBULATORY_CARE_PROVIDER_SITE_OTHER): Payer: 59 | Admitting: Family Medicine

## 2014-10-13 VITALS — BP 160/80 | HR 90 | Temp 99.5°F | Resp 16 | Wt 282.0 lb

## 2014-10-13 DIAGNOSIS — K219 Gastro-esophageal reflux disease without esophagitis: Secondary | ICD-10-CM | POA: Diagnosis not present

## 2014-10-13 DIAGNOSIS — I10 Essential (primary) hypertension: Secondary | ICD-10-CM

## 2014-10-13 DIAGNOSIS — J309 Allergic rhinitis, unspecified: Secondary | ICD-10-CM | POA: Diagnosis not present

## 2014-10-13 DIAGNOSIS — R718 Other abnormality of red blood cells: Secondary | ICD-10-CM

## 2014-10-13 MED ORDER — AZELASTINE-FLUTICASONE 137-50 MCG/ACT NA SUSP: NASAL | Status: DC

## 2014-10-13 MED ORDER — IRBESARTAN 300 MG PO TABS: 300.0000 mg | ORAL_TABLET | Freq: Every day | ORAL | Status: DC

## 2014-10-13 NOTE — Progress Notes (Signed)
OFFICE NOTE  10/13/2014  CC:  Chief Complaint  Patient presents with  . Follow-up   HPI: Patient is a 39 y.o. African-American male who is here for f/u HTN. I last saw him about 8 mo ago, bp "fair" control at that time but we have never had much home monitoring data to go on. RAn out of irbesartan  for 1.5 wks recently, then got back on this med 1 week ago. Most recent home bp checked while still on both bp meds was 144/74.  Diet and activity habits not ideal for HTN lately: busy life, recent vocal cord issues, studying for an exam.  Reviewed recent labs in detail with pt today: no issues.  Says he has been feeling good.  Stil feels some mucous in back of throat fairly regularly but not nearly as bad as it was in the past.  Asks if he can get back on dymista b/c this helped better than flonase in the past.  He is not on a nasal spray at all now.  He is interested in allergist referral for expert allergy testing to see if there are things he can avoid that would help alleviate sx's or make meds for allerg rhin unnecessary.  He got vocal cord polyps removed at [facility] last month and he is satisfied with results of this: less cough, voice improved.  No HAs, CP, dizziness, palpitations, or SOB.  He complains that his left sided hydrocele causes pain for a duration of about 4 hours total per week--  Not really painful, just discomfort.  Worse when sitting a lot.  Says he is not at the point of wanting to see Dr. Eulogio Ditch again.  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  We talked about this more today and he is interested in still getting this testing done.   Pertinent PMH:  Past Medical History  Diagnosis Date  . Cough     "cyclic" per pulm: GERD + rhinitis and PND.  CT neg for sarcoid.   Marland Kitchen LPRD (laryngopharyngeal reflux disease)   . Allergic rhinitis   . Hydrocele     left  . Peripheral edema     left leg>right leg  . HTN (hypertension)   . Hoarseness 2015    [facility] 04/2014-normal laryngoscopy: prednisone taper and bid nexium recommended.  No signif help so pt got 2nd opinion at [facility]   . Vocal cord polyps 2015    surgery [facility]    Past Surgical History  Procedure Laterality Date  . Skin graft  2010    on left index finger  . Wisdom tooth extraction  2005  . Vocal cord surgery  2016    [facility]    MEDS: Takes esomep 47m once to twice per day Outpatient Prescriptions Prior to Visit  Medication Sig Dispense Refill  . esomeprazole (NEXIUM) 20 MG capsule Take 20 mg by mouth daily at 12 noon.    .Marland Kitchenesomeprazole (NEXIUM) 40 MG capsule TAKE ONE CAPSULE EVERY DAY BEFORE BEAKFAST 90 capsule 0  . hydrochlorothiazide (HYDRODIURIL) 25 MG tablet Take 1 tablet (25 mg total) by mouth daily. 30 tablet 0  . irbesartan (AVAPRO) 150 MG tablet Take 1 tablet (150 mg total) by mouth daily. 30 tablet 0  . omeprazole (PRILOSEC OTC) 20 MG tablet  Take 20 mg by mouth daily after breakfast.    . cyclobenzaprine (FLEXERIL) 10 MG tablet Take 1 tablet (10 mg total) by mouth 2 (two) times daily as needed for muscle spasms. (Patient not taking: Reported on 10/13/2014) 5 tablet 0   No facility-administered medications prior to visit.    PE: Blood pressure 160/80, pulse 90, temperature 99.5 F (37.5 C), temperature source Temporal, resp. rate 16, weight 282 lb (127.914 kg), SpO2 96 %. Gen: Alert, well appearing.  Patient is oriented to person, place, time, and situation. AFFECT: pleasant, lucid thought and speech. No further exam today.  LABS:    Chemistry      Component Value Date/Time   NA 135 08/25/2014 0913   K 4.0 08/25/2014 0913   CL 102 08/25/2014 0913   CO2 27 08/25/2014 0913   BUN 25* 08/25/2014 0913   CREATININE 1.27 08/25/2014 0913      Component Value Date/Time    CALCIUM 9.4 08/25/2014 0913   ALKPHOS 54 08/25/2014 0913   AST 19 08/25/2014 0913   ALT 20 08/25/2014 0913   BILITOT 0.6 08/25/2014 0913     Lab Results  Component Value Date   CHOL 146 08/25/2014   HDL 30.50* 08/25/2014   LDLCALC 104* 08/25/2014   TRIG 59.0 08/25/2014   CHOLHDL 5 08/25/2014   Lab Results  Component Value Date   WBC 5.2 08/25/2014   HGB 13.6 08/25/2014   HCT 42.1 08/25/2014   MCV 68.4* 08/25/2014   PLT 226.0 08/25/2014   Lab Results  Component Value Date   TSH 1.72 08/25/2014   IMPRESSION AND PLAN:  1) HTN; not ideal control. Increase irbesartan to 300 mg qd.  Continue HCTZ 59m qd. Monitor home bp. Recheck in office 120mo 2) Allergic rhinitis w/PND. Will get him back on dymista qd and per his request will refer to allergist.  3) GERD/LPR: symptoms fairly well controlled on qd-bid esomep 4019mContinue.    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  [facility]  5) Microcytic erythrocytes, w/out anemia.  No WBC or platelet abnormality.  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 Will proceed with hemoglobin electrophoresis to clarify.  An After Visit Summary was printed and given to the patient.  Spent 30 min with pt today, with >50% of this time spent in counseling and care coordination regarding the above problems.  FOLLOW UP: 81mo37mo

## 2014-10-13 NOTE — Progress Notes (Signed)
Pre visit review using our clinic review tool, if applicable. No additional management support is needed unless otherwise documented below in the visit note. 

## 2014-10-17 ENCOUNTER — Other Ambulatory Visit: Payer: Self-pay | Admitting: Family Medicine

## 2014-10-17 DIAGNOSIS — R718 Other abnormality of red blood cells: Secondary | ICD-10-CM

## 2014-10-19 LAB — HEMOGLOBINOPATHY EVALUATION
HGB S QUANTITAION: 0 %
Hemoglobin Other: 0 %
Hgb A2 Quant: 2.6 % (ref 2.2–3.2)
Hgb A: 97.4 % (ref 96.8–97.8)
Hgb F Quant: 0 % (ref 0.0–2.0)

## 2014-10-26 ENCOUNTER — Telehealth: Payer: Self-pay | Admitting: Family Medicine

## 2014-10-26 ENCOUNTER — Other Ambulatory Visit: Payer: Self-pay | Admitting: Family Medicine

## 2014-10-26 DIAGNOSIS — R718 Other abnormality of red blood cells: Secondary | ICD-10-CM

## 2014-10-26 NOTE — Telephone Encounter (Signed)
I entered orders for this pt and he is going to the [facility] at [place]. Will you print requisitions and fax to them so these can get drawn?-thx!

## 2014-10-27 NOTE — Telephone Encounter (Signed)
Done.

## 2014-11-04 LAB — FERRITIN: FERRITIN: 209 ng/mL (ref 22–322)

## 2014-11-04 LAB — IRON AND TIBC
%SAT: 21 % (ref 20–55)
Iron: 58 ug/dL (ref 42–165)
TIBC: 270 ug/dL (ref 215–435)
UIBC: 212 ug/dL (ref 125–400)

## 2014-11-05 LAB — LEAD, BLOOD: Lead-Whole Blood: <2 ug/dL (ref <10)

## 2014-11-07 LAB — PATHOLOGIST SMEAR REVIEW

## 2014-11-10 ENCOUNTER — Ambulatory Visit: Payer: 59 | Admitting: Family Medicine

## 2014-12-22 ENCOUNTER — Ambulatory Visit (INDEPENDENT_AMBULATORY_CARE_PROVIDER_SITE_OTHER): Payer: 59 | Admitting: Family Medicine

## 2014-12-22 ENCOUNTER — Encounter: Payer: Self-pay | Admitting: Family Medicine

## 2014-12-22 VITALS — BP 152/96 | HR 79 | Temp 98.0°F | Resp 16 | Ht 73.0 in | Wt 283.0 lb

## 2014-12-22 DIAGNOSIS — I10 Essential (primary) hypertension: Secondary | ICD-10-CM

## 2014-12-22 DIAGNOSIS — R718 Other abnormality of red blood cells: Secondary | ICD-10-CM | POA: Diagnosis not present

## 2014-12-22 LAB — BASIC METABOLIC PANEL
BUN: 24 mg/dL — ABNORMAL HIGH (ref 6–23)
CO2: 29 meq/L (ref 19–32)
CREATININE: 1.42 mg/dL (ref 0.40–1.50)
Calcium: 9.7 mg/dL (ref 8.4–10.5)
Chloride: 99 mEq/L (ref 96–112)
GFR: 71.24 mL/min (ref >60.00)
Glucose, Bld: 89 mg/dL (ref 70–99)
Potassium: 4.2 mEq/L (ref 3.5–5.1)
Sodium: 136 mEq/L (ref 135–145)

## 2014-12-22 MED ORDER — AMLODIPINE BESYLATE 10 MG PO TABS: 10.0000 mg | ORAL_TABLET | Freq: Every day | ORAL | Status: DC

## 2014-12-22 MED ORDER — HYDROCHLOROTHIAZIDE 25 MG PO TABS
25.0000 mg | ORAL_TABLET | Freq: Every day | ORAL | Status: DC
Start: 2014-12-22 — End: 2015-10-22

## 2014-12-22 NOTE — Progress Notes (Signed)
OFFICE VISIT  12/22/2014   CC:  Chief Complaint  Patient presents with  . Follow-up    1 month f/u, Pt is not fasting.      HPI:    Patient is a 39 y.o. 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 Reviewed hemoglobin electrophoresis results with him: essentially normal (Hgb A 97.4%, Hgb A2 Quant 2.6, Hgb F, S, and other quant were negative).  We are going to leave this alone for now.  Had allergy eval since last visit, only thing pos was tree sap but not high sensitivity.   Bee sting allergy noted: facial swelling on two occasions + diffuse body itching.  He was rx'd epi-pen.  Avg home bp 150s/90s.  He did not take his bp med today, but says he is usually 100% compliant with meds.  Past Medical History  Diagnosis Date  . Cough     "cyclic" per pulm: GERD + rhinitis and PND.  CT neg for sarcoid.  Marland Kitchen LPRD (laryngopharyngeal reflux disease)   . Allergic rhinitis   . Hydrocele     left  . Peripheral edema     left leg>right leg  . HTN (hypertension)   . Hoarseness 2015    [facility] 04/2014-normal laryngoscopy: prednisone taper and bid nexium recommended.  No signif help so pt got 2nd opinion at [facility]   . Vocal cord polyps 2015    surgery [facility]     Past Surgical History  Procedure Laterality Date  . Skin graft  2010    on left index finger  . Wisdom tooth extraction  2005  . Vocal cord surgery  2016    [facility]    Outpatient Prescriptions Prior to Visit  Medication Sig Dispense Refill  . Azelastine-Fluticasone 137-50 MCG/ACT SUSP 1 spray in each nostril once daily 1 Bottle 11  . esomeprazole (NEXIUM) 40 MG capsule TAKE ONE CAPSULE EVERY DAY BEFORE BEAKFAST 90 capsule 0  . irbesartan (AVAPRO) 300 MG tablet Take 1 tablet (300 mg total) by mouth daily. 30 tablet 11  . cyclobenzaprine (FLEXERIL) 10 MG tablet Take 1 tablet (10 mg total) by mouth 2 (two) times  daily as needed for muscle spasms. 5 tablet 0  . hydrochlorothiazide (HYDRODIURIL) 25 MG tablet Take 1 tablet (25 mg total) by mouth daily. 30 tablet 0   No facility-administered medications prior to visit.    Allergies  Allergen Reactions  . Bee Venom Swelling    ROS As per HPI  PE: Blood pressure 152/96, pulse 79, temperature 98 F (36.7 C), temperature source Oral, resp. rate 16, height _0  (1.854 m), weight 283 lb (128.368 kg), SpO2 95 %. Gen: Alert, well appearing.  Patient is oriented to person, place, time, and situation. CV: RRR, no m/r/g.   LUNGS: CTA bilat, nonlabored resps, good aeration in all lung fields. EXT: no clubbing, cyanosis, or edema.    LABS:  None today   Chemistry      Component Value Date/Time   NA 135 08/25/2014 0913   K 4.0 08/25/2014 0913   CL 102 08/25/2014 0913   CO2 27 08/25/2014 0913   BUN 25* 08/25/2014 0913   CREATININE 1.27 08/25/2014 0913      Component Value Date/Time   CALCIUM 9.4 08/25/2014 0913   ALKPHOS 54 08/25/2014 0913   AST 19 08/25/2014 0913   ALT 20 08/25/2014 0913   BILITOT 0.6 08/25/2014  1224       IMPRESSION AND PLAN:  1) HTN, poor control.  Add amlodipine 23m qd today. Recheck BMET today. Continue home bp monitoring and f/u 1 mo to review.  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 Hb electrophoresis normal, iron studies normal, lead level normal, peripheral smear only confirmed microcytic RBCs.  We'll leave this alone at this time.  An After Visit Summary was printed and given to the patient.  FOLLOW UP: Return in about 1 month (around 01/22/2015) for f/u HTN.

## 2014-12-22 NOTE — Progress Notes (Signed)
Pre visit review using our clinic review tool, if applicable. No additional management support is needed unless otherwise documented below in the visit note.

## 2015-01-01 ENCOUNTER — Other Ambulatory Visit: Payer: Self-pay | Admitting: *Deleted

## 2015-01-01 MED ORDER — IRBESARTAN 300 MG PO TABS: 300.0000 mg | ORAL_TABLET | Freq: Every day | ORAL | Status: DC

## 2015-01-01 NOTE — Telephone Encounter (Signed)
[facility] Ir[facility]artan.  LOV: 12/22/14 NOV: 01/19/15 Last written: 10/13/14 #30 w/ 11RF

## 2015-01-04 ENCOUNTER — Encounter: Payer: Self-pay | Admitting: Family Medicine

## 2015-01-11 ENCOUNTER — Other Ambulatory Visit: Payer: Self-pay | Admitting: Internal Medicine

## 2015-01-19 ENCOUNTER — Ambulatory Visit: Payer: 59 | Admitting: Family Medicine

## 2015-01-26 ENCOUNTER — Ambulatory Visit (INDEPENDENT_AMBULATORY_CARE_PROVIDER_SITE_OTHER): Payer: 59 | Admitting: Family Medicine

## 2015-01-26 ENCOUNTER — Encounter: Payer: Self-pay | Admitting: Family Medicine

## 2015-01-26 VITALS — BP 134/96 | HR 81 | Temp 98.8°F | Resp 16 | Ht 73.0 in | Wt 282.0 lb

## 2015-01-26 DIAGNOSIS — I10 Essential (primary) hypertension: Secondary | ICD-10-CM

## 2015-01-26 MED ORDER — METOPROLOL SUCCINATE ER 25 MG PO TB24: 25.0000 mg | ORAL_TABLET | Freq: Every day | ORAL | Status: DC

## 2015-01-26 NOTE — Progress Notes (Signed)
OFFICE NOTE  01/26/2015  CC:  Chief Complaint  Patient presents with  . Follow-up   HPI: Patient is a 39 y.o. African-American male who is here for 1 mo f/u HTN. No home bp monitoring being done since last visit. No probs with recent addition of amlodipine.  Didn't take bp med yest but took it today.   Pertinent PMH:  Past medical, surgical, social, and family history reviewed and no changes are noted since last office visit.  MEDS:  Outpatient Prescriptions Prior to Visit  Medication Sig Dispense Refill  . amLODipine (NORVASC) 10 MG tablet Take 1 tablet (10 mg total) by mouth daily. 30 tablet 1  . Azelastine-Fluticasone 137-50 MCG/ACT SUSP 1 spray in each nostril once daily 1 Bottle 11  . esomeprazole (NEXIUM) 40 MG capsule TAKE ONE CAPSULE EVERY DAY BEFORE BEAKFAST 90 capsule 0  . hydrochlorothiazide (HYDRODIURIL) 25 MG tablet Take 1 tablet (25 mg total) by mouth daily. 30 tablet 12  . irbesartan (AVAPRO) 300 MG tablet Take 1 tablet (300 mg total) by mouth daily. 90 tablet 3   No facility-administered medications prior to visit.    PE: Blood pressure 152/74, pulse 81, temperature 98.8 F (37.1 C), temperature source Oral, resp. rate 16, height _0  (1.854 m), weight 282 lb (127.914 kg), SpO2 92 %. Repeat manual bp 134/96 CV: RRR, no m/r/g.   LUNGS: CTA bilat, nonlabored resps, good aeration in all lung fields. EXT: 1+ pitting edema in both LL's  LABS:    Chemistry      Component Value Date/Time   NA 136 12/22/2014 0902   K 4.2 12/22/2014 0902   CL 99 12/22/2014 0902   CO2 29 12/22/2014 0902   BUN 24* 12/22/2014 0902   CREATININE 1.42 12/22/2014 0902      Component Value Date/Time   CALCIUM 9.7 12/22/2014 0902   ALKPHOS 54 08/25/2014 0913   AST 19 08/25/2014 0913   ALT 20 08/25/2014 0913   BILITOT 0.6 08/25/2014 0913       IMPRESSION AND PLAN:  HTN; not ideal control. Not doing home monitoring. Start toprol XL 19m qd and continue all other current bp  meds. Buy new bp cuff for home monitoring and check daily and bring numbers in for review in 1 mo.  An After Visit Summary was printed and given to the patient.  FOLLOW UP: 1 mo

## 2015-01-26 NOTE — Patient Instructions (Signed)
Buy a new bp cuff and check your bp once daily and write this number down and bring numbers with you to next appt so we can review them together.

## 2015-01-26 NOTE — Progress Notes (Signed)
Pre visit review using our clinic review tool, if applicable. No additional management support is needed unless otherwise documented below in the visit note.

## 2015-02-07 ENCOUNTER — Telehealth: Payer: Self-pay | Admitting: Family Medicine

## 2015-02-07 MED ORDER — AMLODIPINE BESYLATE 10 MG PO TABS: 10.0000 mg | ORAL_TABLET | Freq: Every day | ORAL | Status: DC

## 2015-02-07 NOTE — Telephone Encounter (Signed)
Amlodipine [facility] [place], patient took last pill today. Pharmacy would not tell him why they wouldn't fill it.

## 2015-02-07 NOTE — Telephone Encounter (Signed)
RF request for amlodipine LOV: 01/26/15 Next ov: 02/23/15 Last written: 12/22/14 #30 w/ 1RF  Rx sent. Pt advised and voiced understanding.

## 2015-02-23 ENCOUNTER — Encounter: Payer: Self-pay | Admitting: Family Medicine

## 2015-02-23 ENCOUNTER — Ambulatory Visit (INDEPENDENT_AMBULATORY_CARE_PROVIDER_SITE_OTHER): Payer: 59 | Admitting: Family Medicine

## 2015-02-23 VITALS — BP 122/77 | HR 66 | Temp 98.0°F | Resp 16 | Ht 73.0 in | Wt 279.0 lb

## 2015-02-23 DIAGNOSIS — I10 Essential (primary) hypertension: Secondary | ICD-10-CM | POA: Diagnosis not present

## 2015-02-23 NOTE — Progress Notes (Signed)
OFFICE NOTE  02/23/2015  CC:  Chief Complaint  Patient presents with  . Follow-up    Pt is fasting.   HPI: Patient is a 39 y.o. African-American male who is here for 1 mo f/u uncontrolled HTN, added toprol XL 64m qd last visit and continued his norvasc 10, HCTZ 225m and irbesartan 300 mg qd. No home bp measurements; never got cuff. Feels no side effects from meds.   Pertinent PMH:  Past medical, surgical, social, and family history reviewed and no changes are noted since last office visit.  MEDS:  Outpatient Prescriptions Prior to Visit  Medication Sig Dispense Refill  . amLODipine (NORVASC) 10 MG tablet Take 1 tablet (10 mg total) by mouth daily. 30 tablet 3  . Azelastine-Fluticasone 137-50 MCG/ACT SUSP 1 spray in each nostril once daily 1 Bottle 11  . esomeprazole (NEXIUM) 40 MG capsule TAKE ONE CAPSULE EVERY DAY BEFORE BEAKFAST 90 capsule 0  . hydrochlorothiazide (HYDRODIURIL) 25 MG tablet Take 1 tablet (25 mg total) by mouth daily. 30 tablet 12  . irbesartan (AVAPRO) 300 MG tablet Take 1 tablet (300 mg total) by mouth daily. 90 tablet 3  . metoprolol succinate (TOPROL-XL) 25 MG 24 hr tablet Take 1 tablet (25 mg total) by mouth daily. 30 tablet 6   No facility-administered medications prior to visit.    PE Blood pressure 122/77, pulse 66, temperature 98 F (36.7 C), temperature source Oral, resp. rate 16, height 6' 1" (1.854 m), weight 279 lb (126.554 kg), SpO2 98 %. Gen: Alert, well appearing.  Patient is oriented to person, place, time, and situation. CV: RRR, no m/r/g.   LUNGS: CTA bilat, nonlabored resps, good aeration in all lung fields. EXT: 1+ pitting edema bilat, no clubbing or cyanosis  LABS:    Chemistry      Component Value Date/Time   NA 136 12/22/2014 0902   K 4.2 12/22/2014 0902   CL 99 12/22/2014 0902   CO2 29 12/22/2014 0902   BUN 24* 12/22/2014 0902   CREATININE 1.42 12/22/2014 0902      Component Value Date/Time   CALCIUM 9.7 12/22/2014 0902    ALKPHOS 54 08/25/2014 0913   AST 19 08/25/2014 0913   ALT 20 08/25/2014 0913   BILITOT 0.6 08/25/2014 0913       IMPRESSION AND PLAN:  HTN: well controlled now. The current medical regimen is effective;  continue present plan and medications. Repeat cr/lytes in 6 mo.  An After Visit Summary was printed and given to the patient.   FOLLOW UP: 6 mo f/u HTN, repeat BMET at that time

## 2015-02-23 NOTE — Progress Notes (Signed)
Pre visit review using our clinic review tool, if applicable. No additional management support is needed unless otherwise documented below in the visit note.

## 2015-03-01 ENCOUNTER — Other Ambulatory Visit: Payer: Self-pay | Admitting: *Deleted

## 2015-03-01 MED ORDER — AMLODIPINE BESYLATE 10 MG PO TABS: 10.0000 mg | ORAL_TABLET | Freq: Every day | ORAL | Status: DC

## 2015-03-28 ENCOUNTER — Other Ambulatory Visit: Payer: Self-pay | Admitting: *Deleted

## 2015-03-28 MED ORDER — METOPROLOL SUCCINATE ER 25 MG PO TB24: 25.0000 mg | ORAL_TABLET | Freq: Every day | ORAL | Status: DC

## 2015-03-28 NOTE — Telephone Encounter (Signed)
RF request for metoprolol LOV: 02/23/15 Next ov: 08/24/15 Last written: 01/26/15 #30 w/ 6RF  Request was for 90 day supply.

## 2015-08-23 ENCOUNTER — Encounter: Payer: Self-pay | Admitting: Family Medicine

## 2015-08-23 ENCOUNTER — Ambulatory Visit (INDEPENDENT_AMBULATORY_CARE_PROVIDER_SITE_OTHER): Payer: 59 | Admitting: Family Medicine

## 2015-08-23 VITALS — BP 122/69 | HR 66 | Temp 98.0°F | Resp 16 | Ht 73.0 in | Wt 272.5 lb

## 2015-08-23 DIAGNOSIS — K219 Gastro-esophageal reflux disease without esophagitis: Secondary | ICD-10-CM

## 2015-08-23 DIAGNOSIS — H8110 Benign paroxysmal vertigo, unspecified ear: Secondary | ICD-10-CM

## 2015-08-23 DIAGNOSIS — I10 Essential (primary) hypertension: Secondary | ICD-10-CM | POA: Diagnosis not present

## 2015-08-23 LAB — BASIC METABOLIC PANEL
BUN: 25 mg/dL — AB (ref 6–23)
CO2: 30 mEq/L (ref 19–32)
CREATININE: 1.47 mg/dL (ref 0.40–1.50)
Calcium: 10.1 mg/dL (ref 8.4–10.5)
Chloride: 97 mEq/L (ref 96–112)
GFR: 68.22 mL/min (ref >60.00)
Glucose, Bld: 96 mg/dL (ref 70–99)
POTASSIUM: 4.2 meq/L (ref 3.5–5.1)
Sodium: 133 mEq/L — ABNORMAL LOW (ref 135–145)

## 2015-08-23 NOTE — Progress Notes (Signed)
Pre visit review using our clinic review tool, if applicable. No additional management support is needed unless otherwise documented below in the visit note.

## 2015-08-23 NOTE — Progress Notes (Signed)
OFFICE VISIT  08/23/2015   CC:  Chief Complaint  Patient presents with  . Follow-up    HTN. Pt is not fasting.    HPI:    Patient is a 40 y.o. African-American male who presents for 6 mo f/u HTN.  Rare home bp checks have been normal.  He has been compliant with meds, trying to eat a heart-healthy diet.  No HAs, no vision c/o's, no CP, no SOB, no LE edema.  Had recent episode of vertigo last week, lasted about 1/2 a day. Has had 3-4 episodes of this in his life, most recent was about a year ago.  Sudden movements sometimes triggers it.  Just lying still with eyes closed helps it diminish.  No recent fevers, headaches.  No nausea.  No hearing deficit.  Pt c/o generic nexium now costing 90 dollars for 90 d supply.  He asks about other med options.  We reviewed other generic PPI's that may be a better tier in his health plan, plus reviewed the option of OTC meds like H2 blockers.  He says his GER has been well controlled.  Past Medical History  Diagnosis Date  . Cough     "cyclic" per pulm: GERD + rhinitis and PND.  CT neg for sarcoid.  Marland Kitchen LPRD (laryngopharyngeal reflux disease)   . Allergic rhinitis   . Hydrocele     left  . Peripheral edema     left leg>right leg  . HTN (hypertension)   . Hoarseness 2015    [facility] 04/2014-normal laryngoscopy: prednisone taper and bid nexium recommended.  No signif help so pt got 2nd opinion at [facility]   . Vocal cord polyps 2015    surgery [facility]     Past Surgical History  Procedure Laterality Date  . Skin graft  2010    on left index finger  . Wisdom tooth extraction  2005  . Vocal cord surgery  2016    [facility]    Outpatient Prescriptions Prior to Visit  Medication Sig Dispense Refill  . amLODipine (NORVASC) 10 MG tablet Take 1 tablet (10 mg total) by mouth daily. 90 tablet 3  . Azelastine-Fluticasone 137-50 MCG/ACT SUSP 1 spray in each nostril once daily 1 Bottle 11  . esomeprazole (NEXIUM) 40 MG capsule TAKE ONE CAPSULE EVERY DAY  BEFORE BEAKFAST 90 capsule 0  . hydrochlorothiazide (HYDRODIURIL) 25 MG tablet Take 1 tablet (25 mg total) by mouth daily. 30 tablet 12  . irbesartan (AVAPRO) 300 MG tablet Take 1 tablet (300 mg total) by mouth daily. 90 tablet 3  . metoprolol succinate (TOPROL-XL) 25 MG 24 hr tablet Take 1 tablet (25 mg total) by mouth daily. 90 tablet 3   No facility-administered medications prior to visit.    Allergies  Allergen Reactions  . Bee Venom Swelling    ROS As per HPI  PE: Blood pressure 122/69, pulse 66, temperature 98 F (36.7 C), temperature source Oral, resp. rate 16, height _0  (1.854 m), weight 272 lb 8 oz (123.605 kg), SpO2 95 %. Gen: Alert, well appearing.  Patient is oriented to person, place, time, and situation. AFFECT: pleasant, lucid thought and speech. Head thrust maneuver: neg.   No spontaneous nystagmus. CV: RRR, no m/r/g.   LUNGS: CTA bilat, nonlabored resps, good aeration in all lung fields.   LABS:  Lab Results  Component Value Date   TSH 1.72 08/25/2014   Lab Results  Component Value Date   WBC 5.2 08/25/2014  HGB 13.6 08/25/2014   HCT 42.1 08/25/2014   MCV 68.4* 08/25/2014   PLT 226.0 08/25/2014   Lab Results  Component Value Date   CREATININE 1.47 08/23/2015   BUN 25* 08/23/2015   NA 133* 08/23/2015   K 4.2 08/23/2015   CL 97 08/23/2015   CO2 30 08/23/2015   Lab Results  Component Value Date   ALT 20 08/25/2014   AST 19 08/25/2014   ALKPHOS 54 08/25/2014   BILITOT 0.6 08/25/2014   Lab Results  Component Value Date   CHOL 146 08/25/2014   Lab Results  Component Value Date   HDL 30.50* 08/25/2014   Lab Results  Component Value Date   LDLCALC 104* 08/25/2014   Lab Results  Component Value Date   TRIG 59.0 08/25/2014   Lab Results  Component Value Date   CHOLHDL 5 08/25/2014   Lab Results  Component Value Date   PSA 0.79 08/25/2014    IMPRESSION AND PLAN:  1) HTN; The current medical regimen is effective;  continue  present plan and medications. Check lytes/cr today.  2) BPPV: gave pt handout with instructions/demonstrations of home epley's maneuvers should this problem recur.  3) GERD: Pt wants to change from daily PPI (due to cost) to bid zantac 121m.  An After Visit Summary was printed and given to the patient.  FOLLOW UP: Return in about 6 months (around 02/23/2016) for annual CPE (fasting).  Signed:  PCrissie Sickles MD           08/23/2015

## 2015-08-24 ENCOUNTER — Ambulatory Visit: Payer: 59 | Admitting: Family Medicine

## 2015-09-24 ENCOUNTER — Other Ambulatory Visit: Payer: Self-pay | Admitting: *Deleted

## 2015-09-24 MED ORDER — ESOMEPRAZOLE MAGNESIUM 40 MG PO CPDR: DELAYED_RELEASE_CAPSULE | ORAL | Status: DC

## 2015-09-24 NOTE — Telephone Encounter (Signed)
RF request for esomeprazole LOV: 08/23/15 Next ov: 02/15/16 Last written: 09/04/14 #90 w/ 0RF

## 2015-10-22 ENCOUNTER — Other Ambulatory Visit: Payer: Self-pay | Admitting: *Deleted

## 2015-10-22 MED ORDER — HYDROCHLOROTHIAZIDE 25 MG PO TABS: 25.0000 mg | ORAL_TABLET | Freq: Every day | ORAL | Status: DC

## 2015-10-22 NOTE — Telephone Encounter (Signed)
RF request for hctz LOV: 08/23/15 Next ov: 02/15/16 Last written: 09/04/14 #90 w/ 3RF

## 2016-01-28 ENCOUNTER — Other Ambulatory Visit: Payer: Self-pay | Admitting: *Deleted

## 2016-01-28 MED ORDER — IRBESARTAN 300 MG PO TABS: 300.0000 mg | ORAL_TABLET | Freq: Every day | ORAL | 1 refills | Status: DC

## 2016-01-28 NOTE — Telephone Encounter (Signed)
[facility]  RF request for irbesartan LOV: 08/23/15 Next ov: 02/15/16 Last written: 01/01/15 #90 w/ 3RF

## 2016-02-15 ENCOUNTER — Encounter: Payer: 59 | Admitting: Family Medicine

## 2016-02-20 ENCOUNTER — Encounter: Payer: Self-pay | Admitting: Family Medicine

## 2016-02-20 ENCOUNTER — Other Ambulatory Visit: Payer: Self-pay | Admitting: Family Medicine

## 2016-02-20 DIAGNOSIS — Z0000 Encounter for general adult medical examination without abnormal findings: Secondary | ICD-10-CM

## 2016-02-27 ENCOUNTER — Other Ambulatory Visit: Payer: Self-pay | Admitting: Family Medicine

## 2016-02-27 DIAGNOSIS — Z0000 Encounter for general adult medical examination without abnormal findings: Secondary | ICD-10-CM

## 2016-02-28 ENCOUNTER — Other Ambulatory Visit: Payer: Self-pay | Admitting: Family Medicine

## 2016-02-28 LAB — TSH: TSH: 2.91 m[IU]/L (ref 0.40–4.50)

## 2016-02-28 LAB — COMPREHENSIVE METABOLIC PANEL
ALT: 14 U/L (ref 9–46)
AST: 17 U/L (ref 10–40)
Albumin: 4 g/dL (ref 3.6–5.1)
Alkaline Phosphatase: 54 U/L (ref 40–115)
BUN: 19 mg/dL (ref 7–25)
CHLORIDE: 100 mmol/L (ref 98–110)
CO2: 24 mmol/L (ref 20–31)
CREATININE: 1.43 mg/dL — AB (ref 0.60–1.35)
Calcium: 9.3 mg/dL (ref 8.6–10.3)
GLUCOSE: 104 mg/dL — AB (ref 65–99)
POTASSIUM: 4.2 mmol/L (ref 3.5–5.3)
SODIUM: 135 mmol/L (ref 135–146)
Total Bilirubin: 0.5 mg/dL (ref 0.2–1.2)
Total Protein: 7 g/dL (ref 6.1–8.1)

## 2016-02-28 LAB — CBC WITH DIFFERENTIAL/PLATELET
Basophils Absolute: 0 cells/uL (ref 0–200)
Basophils Relative: 0 %
EOS ABS: 162 {cells}/uL (ref 15–500)
EOS PCT: 3 %
HCT: 40.9 % (ref 38.5–50.0)
Hemoglobin: 13.1 g/dL — ABNORMAL LOW (ref 13.2–17.1)
LYMPHS PCT: 10 %
Lymphs Abs: 540 cells/uL — ABNORMAL LOW (ref 850–3900)
MCH: 21.9 pg — AB (ref 27.0–33.0)
MCHC: 32 g/dL (ref 32.0–36.0)
MCV: 68.5 fL — AB (ref 80.0–100.0)
MONOS PCT: 13 %
Monocytes Absolute: 702 cells/uL (ref 200–950)
NEUTROS ABS: 3996 {cells}/uL (ref 1500–7800)
Neutrophils Relative %: 74 %
PLATELETS: 256 10*3/uL (ref 140–400)
RBC: 5.97 MIL/uL — AB (ref 4.20–5.80)
RDW: 16.8 % — AB (ref 11.0–15.0)
WBC: 5.4 10*3/uL (ref 3.8–10.8)

## 2016-02-28 LAB — LIPID PANEL
CHOL/HDL RATIO: 5.4 ratio — AB (ref <=5.0)
Cholesterol: 157 mg/dL (ref 125–200)
HDL: 29 mg/dL — ABNORMAL LOW (ref >=40)
LDL Cholesterol: 110 mg/dL (ref <130)
Triglycerides: 91 mg/dL (ref <150)
VLDL: 18 mg/dL (ref <30)

## 2016-02-29 ENCOUNTER — Ambulatory Visit (INDEPENDENT_AMBULATORY_CARE_PROVIDER_SITE_OTHER): Payer: 59 | Admitting: Family Medicine

## 2016-02-29 ENCOUNTER — Encounter: Payer: Self-pay | Admitting: Family Medicine

## 2016-02-29 VITALS — BP 118/77 | HR 80 | Temp 99.1°F | Resp 18 | Ht 73.0 in | Wt 279.0 lb

## 2016-02-29 DIAGNOSIS — Z0000 Encounter for general adult medical examination without abnormal findings: Secondary | ICD-10-CM

## 2016-02-29 NOTE — Progress Notes (Signed)
Pre visit review using our clinic review tool, if applicable. No additional management support is needed unless otherwise documented below in the visit note.        Office Note 02/29/2016  CC:  Chief Complaint  Patient presents with  . Annual Exam    HPI:  Sean Sharp is a 40 y.o. Black male who is here for annual health maintenance exam. Discussed recent lab results in detail today.  All stable. He declines flu vaccine today.  Exercise: not much lately, just some tennis occasionally.  Eye exam UTD. Needs dental preventative visit.   Past Medical History:  Diagnosis Date  . Allergic rhinitis   . 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  Hemoglobin electrophoresis normal 2016.  Marland Kitchen Chronic renal insufficiency, stage II (mild)   . Cough    "cyclic" per pulm: GERD + rhinitis and PND.  CT neg for sarcoid.  Marland Kitchen Hoarseness 2015   [facility] 04/2014-normal laryngoscopy: prednisone taper and bid nexium recommended.  No signif help so pt got 2nd opinion at [facility]   . HTN (hypertension)   . Hydrocele    left  . LPRD (laryngopharyngeal reflux disease)   . Peripheral edema    left leg>right leg  . Vocal cord polyps 2015   surgery [facility]     Past Surgical History:  Procedure Laterality Date  . SKIN GRAFT  2010   on left index finger  . Vocal cord surgery  2016   [facility]  . WISDOM TOOTH EXTRACTION  2005    Family History  Problem Relation Age of Onset  . Sarcoidosis Father     ?  Marland Kitchen Lactose intolerance Father   . Other Mother     colon issues-portion of colon removed  . Lactose intolerance Mother   . Cancer Paternal Grandfather     ?    Social History   Social History  . Marital status: Married    Spouse name: N/A  . Number of children: 2  . Years of education: N/A   Occupational History  . electrician    Social History Main Topics  . Smoking status: Former Smoker    Packs/day: 0.30    Years: 1.50    Types: Cigarettes    Quit date: 06/10/1995  . Smokeless  tobacco: Never Used  . Alcohol use Yes     Comment: occasional  . Drug use: No  . Sexual activity: Not on file   Other Topics Concern  . Not on file   Social History Narrative   Married, 2 children (45 and 48 y/o).   Occupation: Clinical biochemist Editor, commissioning).   Orig from [facility].   No Tobacco.  Rare alcohol.  No drugs.   Enjoys fishing, Environmental health practitioner, wood working, Research officer, trade union.          Outpatient Medications Prior to Visit  Medication Sig Dispense Refill  . amLODipine (NORVASC) 10 MG tablet Take 1 tablet (10 mg total) by mouth daily. 90 tablet 3  . Azelastine-Fluticasone 137-50 MCG/ACT SUSP 1 spray in each nostril once daily 1 Bottle 11  . esomeprazole (NEXIUM) 40 MG capsule TAKE ONE CAPSULE EVERY DAY BEFORE BEAKFAST 90 capsule 3  . hydrochlorothiazide (HYDRODIURIL) 25 MG tablet Take 1 tablet (25 mg total) by mouth daily. 90 tablet 1  . irbesartan (AVAPRO) 300 MG tablet Take 1 tablet (300 mg total) by mouth daily. 90 tablet 1  . metoprolol succinate (TOPROL-XL) 25 MG 24 hr tablet Take 1 tablet (  25 mg total) by mouth daily. 90 tablet 3   No facility-administered medications prior to visit.     Allergies  Allergen Reactions  . Bee Venom Swelling    ROS Review of Systems  Constitutional: Negative for appetite change, chills, fatigue and fever.  HENT: Negative for congestion, dental problem, ear pain and sore throat.   Eyes: Negative for discharge, redness and visual disturbance.  Respiratory: Negative for cough, chest tightness, shortness of breath and wheezing.   Cardiovascular: Negative for chest pain, palpitations and leg swelling.  Gastrointestinal: Negative for abdominal pain, blood in stool, diarrhea, nausea and vomiting.  Genitourinary: Negative for difficulty urinating, dysuria, flank pain, frequency, hematuria and urgency.  Musculoskeletal: Negative for arthralgias, back pain, joint swelling, myalgias and neck stiffness.  Skin: Negative for pallor and  rash.  Neurological: Negative for dizziness, speech difficulty, weakness and headaches.  Hematological: Negative for adenopathy. Does not bruise/bleed easily.  Psychiatric/Behavioral: Negative for confusion and sleep disturbance. The patient is not nervous/anxious.     PE; Blood pressure 118/77, pulse 80, temperature 99.1 F (37.3 C), temperature source Oral, resp. rate 18, height _0  (1.854 m), weight 279 lb (126.6 kg), SpO2 98 %. Gen: Alert, well appearing.  Patient is oriented to person, place, time, and situation. AFFECT: pleasant, lucid thought and speech. ENT: Ears: EACs clear, normal epithelium.  TMs with good light reflex and landmarks bilaterally.  Eyes: no injection, icteris, swelling, or exudate.  EOMI, PERRLA. Nose: no drainage or turbinate edema/swelling.  No injection or focal lesion.  Mouth: lips without lesion/swelling.  Oral mucosa pink and moist.  Dentition intact and without obvious caries or gingival swelling.  Oropharynx without erythema, exudate, or swelling.  Neck: supple/nontender.  No LAD, mass, or TM.  Carotid pulses 2+ bilaterally, without bruits. CV: RRR, no m/r/g.   LUNGS: CTA bilat, nonlabored resps, good aeration in all lung fields. ABD: soft, NT, ND, BS normal.  No hepatospenomegaly or mass.  No bruits. EXT: no clubbing, cyanosis, or edema.  Musculoskeletal: no joint swelling, erythema, warmth, or tenderness.  ROM of all joints intact. Skin - no sores or suspicious lesions or rashes or color changes   Pertinent labs:  Lab Results  Component Value Date   TSH 2.91 02/28/2016   Lab Results  Component Value Date   WBC 5.4 02/28/2016   HGB 13.1 (L) 02/28/2016   HCT 40.9 02/28/2016   MCV 68.5 (L) 02/28/2016   PLT 256 02/28/2016   Lab Results  Component Value Date   CREATININE 1.43 (H) 02/28/2016   BUN 19 02/28/2016   NA 135 02/28/2016   K 4.2 02/28/2016   CL 100 02/28/2016   CO2 24 02/28/2016   Lab Results  Component Value Date   ALT 14  02/28/2016   AST 17 02/28/2016   ALKPHOS 54 02/28/2016   BILITOT 0.5 02/28/2016   Lab Results  Component Value Date   CHOL 157 02/28/2016   Lab Results  Component Value Date   HDL 29 (L) 02/28/2016   Lab Results  Component Value Date   LDLCALC 110 02/28/2016   Lab Results  Component Value Date   TRIG 91 02/28/2016   Lab Results  Component Value Date   CHOLHDL 5.4 (H) 02/28/2016   Lab Results  Component Value Date   PSA 0.79 08/25/2014    ASSESSMENT AND PLAN:   Reviewed age and gender appropriate health maintenance issues (prudent diet, regular exercise, health risks of tobacco and excessive alcohol, use of seatbelts,  fire alarms in home, use of sunscreen).  Also reviewed age and gender appropriate health screening as well as vaccine recommendations. Pt declined flu vaccine. Reviewed fasting HP labs in detail: all stable.  An After Visit Summary was printed and given to the patient.  FOLLOW UP:  Return in about 6 months (around 08/28/2016) for routine chronic illness f/u.  Signed:  Crissie Sickles, MD           02/29/2016

## 2016-03-26 ENCOUNTER — Other Ambulatory Visit: Payer: Self-pay

## 2016-03-26 MED ORDER — AMLODIPINE BESYLATE 10 MG PO TABS: 10.0000 mg | ORAL_TABLET | Freq: Every day | ORAL | 3 refills | Status: DC

## 2016-05-16 ENCOUNTER — Other Ambulatory Visit: Payer: Self-pay | Admitting: *Deleted

## 2016-05-16 MED ORDER — METOPROLOL SUCCINATE ER 25 MG PO TB24: 25.0000 mg | ORAL_TABLET | Freq: Every day | ORAL | 3 refills | Status: DC

## 2016-05-16 NOTE — Telephone Encounter (Signed)
[facility] [address] requesting refill for metoprolol  LOV:  02/29/16 NOV:08/29/16 Last written: 03/28/15 #90 w/ 3RF

## 2016-06-09 HISTORY — PX: SKIN BIOPSY: SHX1

## 2016-06-20 ENCOUNTER — Other Ambulatory Visit: Payer: Self-pay | Admitting: *Deleted

## 2016-06-20 MED ORDER — HYDROCHLOROTHIAZIDE 25 MG PO TABS: 25.0000 mg | ORAL_TABLET | Freq: Every day | ORAL | 1 refills | Status: DC

## 2016-06-20 NOTE — Telephone Encounter (Signed)
[facility] [address]  RF request for hctz LOV: 08/23/15 f/u, 02/29/16 CPE Next ov: 08/29/16 Last written: 10/22/15 #90 w/ 1RF

## 2016-08-11 ENCOUNTER — Other Ambulatory Visit: Payer: Self-pay | Admitting: Family Medicine

## 2016-08-29 ENCOUNTER — Encounter: Payer: Self-pay | Admitting: Family Medicine

## 2016-08-29 ENCOUNTER — Ambulatory Visit (INDEPENDENT_AMBULATORY_CARE_PROVIDER_SITE_OTHER): Payer: 59 | Admitting: Family Medicine

## 2016-08-29 VITALS — BP 127/79 | HR 76 | Temp 98.5°F | Resp 16 | Ht 73.0 in | Wt 278.0 lb

## 2016-08-29 DIAGNOSIS — N182 Chronic kidney disease, stage 2 (mild): Secondary | ICD-10-CM

## 2016-08-29 DIAGNOSIS — I10 Essential (primary) hypertension: Secondary | ICD-10-CM

## 2016-08-29 LAB — BASIC METABOLIC PANEL
BUN: 21 mg/dL (ref 6–23)
CALCIUM: 9.3 mg/dL (ref 8.4–10.5)
CHLORIDE: 101 meq/L (ref 96–112)
CO2: 28 mEq/L (ref 19–32)
CREATININE: 1.32 mg/dL (ref 0.40–1.50)
GFR: 76.85 mL/min (ref >60.00)
Glucose, Bld: 110 mg/dL — ABNORMAL HIGH (ref 70–99)
Potassium: 4.1 mEq/L (ref 3.5–5.1)
Sodium: 137 mEq/L (ref 135–145)

## 2016-08-29 NOTE — Progress Notes (Signed)
Pre visit review using our clinic review tool, if applicable. No additional management support is needed unless otherwise documented below in the visit note.

## 2016-08-29 NOTE — Progress Notes (Signed)
OFFICE VISIT  08/29/2016   CC:  Chief Complaint  Patient presents with  . Follow-up    [name], pt is fasting.    HPI:    Patient is a 41 y.o.  male who presents for 6 mo f/u HTN and CRI stage (GFR upper 60s). Says he's feeling fine. Not monitoring bp at home at all. Going to the [facility] more often.  Past Medical History:  Diagnosis Date  . Allergic rhinitis   . 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  Hemoglobin electrophoresis normal 2016.  Marland Kitchen Chronic renal insufficiency, stage II (mild)   . Cough    "cyclic" per pulm: GERD + rhinitis and PND.  CT neg for sarcoid.  Marland Kitchen Hoarseness 2015   [facility] 04/2014-normal laryngoscopy: prednisone taper and bid nexium recommended.  No signif help so pt got 2nd opinion at [facility]   . HTN (hypertension)   . Hydrocele    left  . LPRD (laryngopharyngeal reflux disease)   . Peripheral edema    left leg>right leg  . Vocal cord polyps 2015   surgery [facility]     Past Surgical History:  Procedure Laterality Date  . SKIN GRAFT  2010   on left index finger  . Vocal cord surgery  2016   [facility]  . [place] EXTRACTION  2005    Outpatient Medications Prior to Visit  Medication Sig Dispense Refill  . amLODipine (NORVASC) 10 MG tablet Take 1 tablet (10 mg total) by mouth daily. 90 tablet 3  . Azelastine-Fluticasone 137-50 MCG/ACT SUSP 1 spray in each nostril once daily 1 Bottle 11  . esomeprazole (NEXIUM) 40 MG capsule TAKE ONE CAPSULE EVERY DAY BEFORE BEAKFAST 90 capsule 3  . hydrochlorothiazide (HYDRODIURIL) 25 MG tablet Take 1 tablet (25 mg total) by mouth daily. 90 tablet 1  . irbesartan (AVAPRO) 300 MG tablet TAKE 1 TABLET (300 MG TOTAL) BY MOUTH DAILY. 90 tablet 1  . metoprolol succinate (TOPROL-XL) 25 MG 24 hr tablet Take 1 tablet (25 mg total) by mouth daily. 90 tablet 3   No facility-administered medications prior to visit.     Allergies  Allergen Reactions  . Bee Venom Swelling    ROS As per HPI  PE: Blood pressure 127/79,  pulse 76, temperature 98.5 F (36.9 C), temperature source Oral, resp. rate 16, height 6' 1" (1.854 m), weight 278 lb (126.1 kg), SpO2 96 %. Gen: Alert, well appearing.  Patient is oriented to person, place, time, and situation. CV: RRR, no m/r/g.   LUNGS: CTA bilat, nonlabored resps, good aeration in all lung fields. EXT: no clubbing, cyanosis, or edema.    LABS:    Chemistry      Component Value Date/Time   NA 135 02/28/2016 0720   K 4.2 02/28/2016 0720   CL 100 02/28/2016 0720   CO2 24 02/28/2016 0720   BUN 19 02/28/2016 0720   CREATININE 1.43 (H) 02/28/2016 0720      Component Value Date/Time   CALCIUM 9.3 02/28/2016 0720   ALKPHOS 54 02/28/2016 0720   AST 17 02/28/2016 0720   ALT 14 02/28/2016 0720   BILITOT 0.5 02/28/2016 0720     Lab Results  Component Value Date   CHOL 157 02/28/2016   HDL 29 (L) 02/28/2016   LDLCALC 110 02/28/2016   TRIG 91 02/28/2016   CHOLHDL 5.4 (H) 02/28/2016   IMPRESSION AND PLAN:  HTN; The current medical regimen is effective;  continue present plan and medications.  Try to monitor bp at home at least monthly. Continue attempts to improve diet/exercise.  An After Visit Summary was printed and given to the patient.  FOLLOW UP: Return in about 6 months (around 03/01/2017) for annual CPE (fasting).  Signed:  Crissie Sickles, MD           08/29/2016

## 2016-11-19 ENCOUNTER — Other Ambulatory Visit: Payer: Self-pay | Admitting: Family Medicine

## 2016-11-19 ENCOUNTER — Telehealth: Payer: Self-pay | Admitting: Family Medicine

## 2016-11-19 MED ORDER — AZELASTINE-FLUTICASONE 137-50 MCG/ACT NA SUSP: NASAL | 11 refills | Status: DC

## 2016-11-19 NOTE — Telephone Encounter (Signed)
Patient has a cough, is requesting Rx for Dym[facility]. Has used it in the past & has done well with medication

## 2016-11-19 NOTE — Telephone Encounter (Signed)
Pt advised and voiced understanding.

## 2016-11-19 NOTE — Telephone Encounter (Signed)
Please advise. Thanks.

## 2016-11-19 NOTE — Telephone Encounter (Signed)
OK, rx sent in as per pt request.

## 2016-12-07 DIAGNOSIS — D860 Sarcoidosis of lung: Secondary | ICD-10-CM

## 2016-12-07 HISTORY — DX: Sarcoidosis of lung: D86.0

## 2016-12-11 ENCOUNTER — Ambulatory Visit (INDEPENDENT_AMBULATORY_CARE_PROVIDER_SITE_OTHER)
Admission: RE | Admit: 2016-12-11 | Discharge: 2016-12-11 | Disposition: A | Discharge status: Home / Self Care | Payer: 59 | Source: Ambulatory Visit | Attending: Internal Medicine | Admitting: Internal Medicine

## 2016-12-11 ENCOUNTER — Encounter: Payer: Self-pay | Admitting: Internal Medicine

## 2016-12-11 ENCOUNTER — Ambulatory Visit (INDEPENDENT_AMBULATORY_CARE_PROVIDER_SITE_OTHER): Payer: 59 | Admitting: Internal Medicine

## 2016-12-11 VITALS — BP 130/78 | HR 86 | Ht 74.0 in | Wt 278.0 lb

## 2016-12-11 DIAGNOSIS — R918 Other nonspecific abnormal finding of lung field: Secondary | ICD-10-CM

## 2016-12-11 DIAGNOSIS — R05 Cough: Secondary | ICD-10-CM | POA: Diagnosis not present

## 2016-12-11 DIAGNOSIS — R059 Cough, unspecified: Secondary | ICD-10-CM

## 2016-12-11 LAB — NITRIC OXIDE: Nitric Oxide: 18

## 2016-12-11 MED ORDER — BENZONATATE 200 MG PO CAPS: 200.0000 mg | ORAL_CAPSULE | Freq: Three times a day (TID) | ORAL | 2 refills | Status: DC | PRN

## 2016-12-11 MED ORDER — PANTOPRAZOLE SODIUM 40 MG PO TBEC: DELAYED_RELEASE_TABLET | ORAL | 2 refills | Status: DC

## 2016-12-11 NOTE — Progress Notes (Signed)
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   1997 s sequelae 2006  With exp to foam bad cough resolved w/in 3 days of avoidance and did fine until 2011 really bad cold > chronic cough eval by Sean Sharp 04/2010 with suspicion for sarcoid (pos in father) but CT with only nonspecific adenopathy  waxes and wanes since then with extensive w/u / rx at [facility] voice center (reviewed in care everywhere)  And much  worse x 09/2016 assoc with hb/ worse with certain foods and some better dymista self referred to pulmonary clinic 12/11/2016      12/11/2016 1st [facility] office visit/ Sean Sharp   Chief Complaint  [name], and has "passed out" once before due to cough.    cough x 6 years  Citric drinks make it worse  Allergy eval [place] cedar allergy  nexium as walking the door     Kouffman Reflux v Neurogenic Cough Differentiator Reflux Comments  Do you awaken from a sound sleep coughing violently?                            With trouble breathing? No   Do you have choking episodes when you cannot  Get enough air, gasping for air ?              Yes   Do you usually cough when you lie down into  The bed, or when you just lie down to rest ?                          Sometimes depending on what eaten   Do you usually cough after meals or eating?         Yes   Do you cough when (or after) you bend over?    Yes if full   GERD SCORE     Kouffman Reflux v Neurogenic Cough Differentiator Neurogenic   Do you more-or-less cough all day long? Sporadically    Does change of temperature make you cough? Not much   Does laughing or chuckling cause you to cough? yes   Do fumes (perfume,  automobile fumes, burned  Toast, etc.,) cause you to cough ?      Can, esp cooking on grill   Does speaking, singing, or talking on the phone cause you to cough   ?               No    Neurogenic/Airway score       No other  obvious day to day or daytime variability or assoc excess/ purulent sputum or mucus plugs or hemoptysis or cp or chest tightness, subjective wheeze or overt sinus or hb symptoms. No unusual exp hx or h/o childhood pna/ asthma or knowledge of premature birth.  Sleeping ok without nocturnal  or early am exacerbation  of respiratory  c/o's or need for noct saba. Also denies any obvious fluctuation of symptoms with weather or environmental changes or other aggravating or alleviating factors except as outlined above  Current Medications, Allergies, Complete Past Medical History, Past Surgical History, Family History, and Social History were reviewed in [employer] record.  ROS  The following are not active complaints unless bolded sore throat, dysphagia, dental problems, itching, sneezing,  nasal congestion or excess/ purulent secretions, ear ache,   fever, chills, sweats, unintended wt loss, classically pleuritic or exertional cp,  orthopnea pnd or leg swelling, presyncope, palpitations, abdominal pain, anorexia, nausea, vomiting, diarrhea  or change in bowel or bladder habits, change in stools or urine, dysuria,hematuria,  rash, arthralgias, visual complaints, headache, numbness, weakness or ataxia or problems with walking or coordination,  change in mood/affect or memory.          Review of Systems     Objective:   Physical Exam    amb bm nad with shrill harsh dry cough    Wt Readings from Last 3 Encounters:  12/11/16 278 lb (126.1 kg)  08/29/16 278 lb (126.1 kg)  02/29/16 279 lb (126.6 kg)    Vital signs reviewed  - Note on arrival 02 sats  94 % on RA      HEENT: nl dentition, turbinates bilaterally, and oropharynx. Nl external ear canals  without cough reflex   NECK :  without JVD/Nodes/TM/ nl carotid upstrokes bilaterally   LUNGS: no acc muscle use,  Nl contour chest which is clear to A and P bilaterally with cough on insp  maneuvers   CV:  RRR  no s3 or murmur or increase in P2, and no edema   ABD:  soft and nontender with nl inspiratory excursion in the supine position. No bruits or organomegaly appreciated, bowel sounds nl  MS:  Nl gait/ ext warm without deformities, calf tenderness, cyanosis or clubbing No obvious joint restrictions   SKIN: warm and dry without lesions    NEURO:  alert, approp, nl sensorium with  no motor or cerebellar deficits apparent.     CXR PA and Lateral:   12/11/2016 :    I personally reviewed images and agree with radiology impression as follows:  3 x 5 cm focal opacity within the superior segment of the right lower lobe which may represent focal airspace disease, consolidation or possible mass.  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

## 2016-12-11 NOTE — Patient Instructions (Addendum)
Protonix (pantoprazole) 40 mg  Take 30- 60 min before your first and last meals of the day   Continue dymista one twice daily - point toward ear on same side  For drainage / throat tickle as need  >>>   take CHLORPHENIRAMINE  4 mg - take one every 4 hours as needed - available over the counter- may cause drowsiness so start with just a bedtime dose or two and see how you tolerate it before trying in daytime    For cough >>>   tessilon 200 mg three to four times daily    GERD (REFLUX)  is an extremely common cause of respiratory symptoms just like yours , many times with no obvious heartburn at all.    It can be treated with medication, but also with lifestyle changes including elevation of the head of your bed (ideally with 6 inch  bed blocks),  Smoking cessation, avoidance of late meals, excessive alcohol, and avoid fatty foods, chocolate, peppermint, colas, red wine, and acidic juices such as orange juice.  NO MINT OR MENTHOL PRODUCTS SO NO COUGH DROPS   USE SUGARLESS CANDY INSTEAD (Jolley ranchers or Stover's or Life Savers) or even ice chips will also do - the key is to swallow to prevent all throat clearing. NO OIL BASED VITAMINS - use powdered substitutes.   Please remember to go to the x-ray department downstairs in the basement  for your tests - we will call you with the results when they are available.     If not better at 6 weeks you need to return to [facility] - if better then ok to refill meds thru your PCP or return here at 3 months to regroup re need for longterm treatment options - late add:  ? pna in rll/ ? Sarcoid changes also rec:  Return for esr/ cmet/ angiotensin, cbc with diff and rx Augmentin 875 mg take one pill twice daily  X 10 days - take at breakfast and supper with large glass of water.  It would help reduce the usual side effects (diarrhea and yeast infections) if you ate cultured yogurt at lunch. And  Prednisone 10 mg take  4 each am x 2 days,   2 each am x 2 days,   1 each am x 2 days and stop  And f/u with cxr in 2 weeks

## 2016-12-12 ENCOUNTER — Other Ambulatory Visit: Payer: Self-pay | Admitting: Internal Medicine

## 2016-12-12 DIAGNOSIS — D869 Sarcoidosis, unspecified: Secondary | ICD-10-CM | POA: Insufficient documentation

## 2016-12-12 DIAGNOSIS — R918 Other nonspecific abnormal finding of lung field: Secondary | ICD-10-CM

## 2016-12-12 MED ORDER — PREDNISONE 10 MG PO TABS
ORAL_TABLET | ORAL | 0 refills | Status: DC
Start: 2016-12-12 — End: 2017-01-06

## 2016-12-12 MED ORDER — AMOXICILLIN-POT CLAVULANATE 875-125 MG PO TABS: 1.0000 | ORAL_TABLET | Freq: Two times a day (BID) | ORAL | 0 refills | Status: DC

## 2016-12-12 NOTE — Assessment & Plan Note (Addendum)
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 because of secondary gerd / cyclical cough pattern which we typically see p uri or other non-specific insult but less common in men than women statistically and his RN changes on cxr could very well be longstanding and the primary source of the cough   Have advised whatever the cause for cough he needs vigorous rx of the gerd component and will address the cxr changes separately   Total time devoted to counseling  > 50 % of initial 60 min office visit:  review case with pt/ discussion of options/alternatives/ personally creating written customized instructions  in presence of pt  then going over those specific  Instructions directly with the pt including how to use all of the meds but in particular covering each new medication in detail and the difference between the maintenance= "automatic" meds and the prns using an action plan format for the latter (If this problem/symptom => do that organization reading Left to right).  Please see AVS from this visit for a full list of these instructions which I personally wrote for this pt and  are unique to this visit.

## 2016-12-12 NOTE — Assessment & Plan Note (Signed)
See Cxr 12/11/2016 > rx augmentin x 10 days and pred x 6 and f/u with cxr / ov in 2 weeks  His risk of lung ca is very low and the RN changes are typical for sarcoid so suspect the as dz in sup segment RLL are infectious/ inflammatory and not a lung mass   Discussed in detail all the  indications, usual  risks and alternatives  relative to the benefits with patient who agrees to proceed with rx as outlined

## 2016-12-12 NOTE — Progress Notes (Signed)
Spoke with pt and notified of results per Dr. Melvyn Novas. Pt verbalized understanding and denied any questions. Labs ordered, meds sent and appt scheduled

## 2016-12-18 ENCOUNTER — Other Ambulatory Visit (INDEPENDENT_AMBULATORY_CARE_PROVIDER_SITE_OTHER): Payer: 59

## 2016-12-18 ENCOUNTER — Telehealth: Payer: Self-pay | Admitting: Internal Medicine

## 2016-12-18 DIAGNOSIS — R918 Other nonspecific abnormal finding of lung field: Secondary | ICD-10-CM | POA: Diagnosis not present

## 2016-12-18 LAB — CBC WITH DIFFERENTIAL/PLATELET
BASOS PCT: 0.4 % (ref 0.0–3.0)
Basophils Absolute: 0 10*3/uL (ref 0.0–0.1)
EOS ABS: 0.1 10*3/uL (ref 0.0–0.7)
Eosinophils Relative: 1.4 % (ref 0.0–5.0)
HEMATOCRIT: 40.8 % (ref 39.0–52.0)
HEMOGLOBIN: 13.2 g/dL (ref 13.0–17.0)
LYMPHS PCT: 4.3 % — AB (ref 12.0–46.0)
Lymphs Abs: 0.3 10*3/uL — ABNORMAL LOW (ref 0.7–4.0)
MCHC: 32.2 g/dL (ref 30.0–36.0)
MCV: 66.5 fl — ABNORMAL LOW (ref 78.0–100.0)
MONO ABS: 0.6 10*3/uL (ref 0.1–1.0)
Monocytes Relative: 7.4 % (ref 3.0–12.0)
Neutro Abs: 6.8 10*3/uL (ref 1.4–7.7)
Neutrophils Relative %: 86.5 % — ABNORMAL HIGH (ref 43.0–77.0)
Platelets: 288 10*3/uL (ref 150.0–400.0)
RBC: 6.14 Mil/uL — AB (ref 4.22–5.81)
RDW: 18.3 % — ABNORMAL HIGH (ref 11.5–15.5)
WBC: 7.8 10*3/uL (ref 4.0–10.5)

## 2016-12-18 LAB — COMPREHENSIVE METABOLIC PANEL
ALBUMIN: 3.9 g/dL (ref 3.5–5.2)
ALK PHOS: 56 U/L (ref 39–117)
ALT: 19 U/L (ref 0–53)
AST: 18 U/L (ref 0–37)
BUN: 26 mg/dL — AB (ref 6–23)
CALCIUM: 9.6 mg/dL (ref 8.4–10.5)
CHLORIDE: 100 meq/L (ref 96–112)
CO2: 26 mEq/L (ref 19–32)
CREATININE: 1.49 mg/dL (ref 0.40–1.50)
GFR: 66.72 mL/min (ref >60.00)
Glucose, Bld: 152 mg/dL — ABNORMAL HIGH (ref 70–99)
POTASSIUM: 3.9 meq/L (ref 3.5–5.1)
SODIUM: 134 meq/L — AB (ref 135–145)
TOTAL PROTEIN: 7.6 g/dL (ref 6.0–8.3)
Total Bilirubin: 0.4 mg/dL (ref 0.2–1.2)

## 2016-12-18 LAB — SEDIMENTATION RATE: Sed Rate: 44 mm/hr — ABNORMAL HIGH (ref 0–15)

## 2016-12-18 NOTE — Progress Notes (Signed)
LMTCB

## 2016-12-18 NOTE — Telephone Encounter (Signed)
Called and spoke with pt and he is aware of results per MW.  Nothing further is needed.

## 2016-12-18 NOTE — Progress Notes (Signed)
ATC, NA and VM is full

## 2016-12-22 LAB — ANGIOTENSIN CONVERTING ENZYME: ANGIOTENSIN-CONVERTING ENZYME: 138 U/L — AB (ref 9–67)

## 2016-12-26 ENCOUNTER — Ambulatory Visit: Payer: 59 | Admitting: Internal Medicine

## 2016-12-29 ENCOUNTER — Ambulatory Visit (INDEPENDENT_AMBULATORY_CARE_PROVIDER_SITE_OTHER)
Admission: RE | Admit: 2016-12-29 | Discharge: 2016-12-29 | Disposition: A | Discharge status: Home / Self Care | Payer: 59 | Source: Ambulatory Visit | Attending: Internal Medicine | Admitting: Internal Medicine

## 2016-12-29 ENCOUNTER — Other Ambulatory Visit: Payer: Self-pay | Admitting: Internal Medicine

## 2016-12-29 ENCOUNTER — Telehealth: Payer: Self-pay

## 2016-12-29 DIAGNOSIS — R918 Other nonspecific abnormal finding of lung field: Secondary | ICD-10-CM

## 2016-12-29 NOTE — Telephone Encounter (Signed)
Roy at [facility] calling for a call report from CXR taken today.  Full report available in Epic, impression copied below:  IMPRESSION: Stable coarse interstitial densities are noted diffusely throughout both lungs most consistent with sarcoidosis.  Stable right perihilar and midlung opacity is noted which may represent pneumonia, atelectasis or sarcoidosis, but underlying neoplasm cannot be ruled out. CT scan of the chest is recommended further evaluation. These results will be called to the ordering clinician or representative by the Radiologist Assistant, and communication documented in the PACS or zVision Dashboard.  MW please advise on further recs.  Thanks!  Patient Instructions   Protonix (pantoprazole) 40 mg  Take 30- 60 min before your first and last meals of the day    Continue dymista one twice daily - point toward ear on same side   For drainage / throat tickle as need  >>>   take CHLORPHENIRAMINE  4 mg - take one every 4 hours as needed - available over the counter- may cause drowsiness so start with just a bedtime dose or two and see how you tolerate it before trying in daytime     For cough >>>   tessilon 200 mg three to four times daily      GERD (REFLUX)  is an extremely common cause of respiratory symptoms just like yours , many times with no obvious heartburn at all.     It can be treated with medication, but also with lifestyle changes including elevation of the head of your bed (ideally with 6 inch  bed blocks),  Smoking cessation, avoidance of late meals, excessive alcohol, and avoid fatty foods, chocolate, peppermint, colas, red wine, and acidic juices such as orange juice.  NO MINT OR MENTHOL PRODUCTS SO NO COUGH DROPS   USE SUGARLESS CANDY INSTEAD (Jolley ranchers or Stover's or Life Savers) or even ice chips will also do - the key is to swallow to prevent all throat clearing. NO OIL BASED VITAMINS - use powdered substitutes.   Please remember to go to the  x-ray department downstairs in the basement  for your tests - we will call you with the results when they are available.     If not better at 6 weeks you need to return to [facility] - if better then ok to refill meds thru your PCP or return here at 3 months to regroup re need for longterm treatment options - late add:  ? pna in rll/ ? Sarcoid changes also rec:  Return for esr/ cmet/ angiotensin, cbc with diff and rx Augmentin 875 mg take one pill twice daily  X 10 days - take at breakfast and supper with large glass of water.  It would help reduce the usual side effects (diarrhea and yeast infections) if you ate cultured yogurt at lunch. And  Prednisone 10 mg take  4 each am x 2 days,   2 each am x 2 days,  1 each am x 2 days and stop  And f/u with cxr in 2 weeks

## 2016-12-30 NOTE — Telephone Encounter (Signed)
See result note   I d/w pt the results and he verbalized understanding  Order sent to [facility] for CT with CM

## 2016-12-30 NOTE — Progress Notes (Signed)
Spoke with pt and notified of results per Dr. Melvyn Novas. Pt verbalized understanding and denied any questions.

## 2017-01-05 ENCOUNTER — Ambulatory Visit: Payer: 59 | Admitting: Adult Health

## 2017-01-05 ENCOUNTER — Ambulatory Visit (INDEPENDENT_AMBULATORY_CARE_PROVIDER_SITE_OTHER)
Admission: RE | Admit: 2017-01-05 | Discharge: 2017-01-05 | Disposition: A | Discharge status: Home / Self Care | Payer: 59 | Source: Ambulatory Visit | Attending: Internal Medicine | Admitting: Internal Medicine

## 2017-01-05 DIAGNOSIS — R918 Other nonspecific abnormal finding of lung field: Secondary | ICD-10-CM

## 2017-01-05 HISTORY — DX: Sarcoidosis of lung: D86.0

## 2017-01-05 MED ORDER — IOPAMIDOL (ISOVUE-300) INJECTION 61%
80.0000 mL | Freq: Once | INTRAVENOUS | Status: AC | PRN
  Administered 2017-01-05: 80 mL via INTRAVENOUS

## 2017-01-06 ENCOUNTER — Encounter: Payer: Self-pay | Admitting: Internal Medicine

## 2017-01-06 ENCOUNTER — Ambulatory Visit (INDEPENDENT_AMBULATORY_CARE_PROVIDER_SITE_OTHER): Payer: 59 | Admitting: Internal Medicine

## 2017-01-06 VITALS — BP 136/88 | HR 90 | Ht 74.0 in | Wt 281.2 lb

## 2017-01-06 DIAGNOSIS — R918 Other nonspecific abnormal finding of lung field: Secondary | ICD-10-CM | POA: Diagnosis not present

## 2017-01-06 DIAGNOSIS — M7021 Olecranon bursitis, right elbow: Secondary | ICD-10-CM | POA: Diagnosis not present

## 2017-01-06 MED ORDER — PREDNISONE 10 MG PO TABS: ORAL_TABLET | ORAL | 0 refills | Status: DC

## 2017-01-06 NOTE — Progress Notes (Signed)
Will discuss at ov today

## 2017-01-06 NOTE — Patient Instructions (Addendum)
Continue protonix and dymista as you are  If cough or breathing worse, take prednisone 10 mg x 2 until better then 1 daily x one week then stop   We will call you with appt to see dermatology and orthopedics   Please schedule a follow up office visit in 6 weeks, call sooner if needed

## 2017-01-06 NOTE — Progress Notes (Signed)
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  Quit smoking   1997 s sequelae 2006  With exp to foam bad cough resolved w/in 3 days of avoidance and did fine until 2011 really bad cold > chronic cough eval by Dr Joya Gaskins 04/2010 with suspicion for sarcoid (pos in father) but CT with only nonspecific adenopathy  waxes and wanes since then with extensive w/u / rx at [facility] voice center (reviewed in care everywhere)  And much  worse x 09/2016 assoc with hb/ worse with certain foods and some better dymista self referred to pulmonary clinic 12/11/2016      12/11/2016 1st [facility] office visit/ Sean Sharp   Chief Complaint  [name], and has "passed out" once before due to cough.    cough x 6 years  Citric drinks make it worse  Allergy eval [place] cedar allergy  nexium as walking the door     Kouffman Reflux v Neurogenic Cough Differentiator Reflux Comments  Do you awaken from a sound sleep coughing violently?                            With trouble breathing? No   Do you have choking episodes when you cannot  Get enough air, gasping for air ?              Yes   Do you usually cough when you lie down into  The bed, or when you just lie down to rest ?                          Sometimes depending on what eaten   Do you usually cough after meals or eating?         Yes   Do you cough when (or after) you bend over?    Yes if full   GERD SCORE     Kouffman Reflux v Neurogenic Cough Differentiator Neurogenic   Do you more-or-less cough all day long? Sporadically    Does change of temperature make you cough? Not much   Does laughing or chuckling cause you to cough? yes   Do  fumes (perfume, automobile fumes, burned  Toast, etc.,) cause you to cough ?      Can, esp cooking on grill   Does speaking, singing, or talking on the phone cause you to cough   ?               No    Neurogenic/Airway score      rec Protonix (pantoprazole) 40 mg  Take 30- 60 min before your first and last meals of the day  Continue dymista one twice daily - point toward ear on same side For drainage / throat tickle as need  >>>   take CHLORPHENIRAMINE  4 mg - take one every 4 hours as needed -  For cough >>>   tessilon 200 mg three to four times daily  GERD (REFLUX)   Please remember to go to the x-ray department downstairs in the basement  for your tests - we will call you with the results when they are available. If not better at 6 weeks you need to return to [facility] - if better then ok to refill meds thru your PCP or return here at 3 months to regroup re need for longterm treatment options - late add:  ? pna in rll/ ? Sarcoid changes also rec:  Return for esr/ cmet/ angiotensin, cbc with diff and rx Augmentin 875 mg take one pill twice daily  X 10 daysAnd  Prednisone 10 mg take  4 each am x 2 days,   2 each am x 2 days,  1 each am x 2 days and stop  And f/u with cxr in 2 weeks =  No change 12/29/16  - Angiotensin  12/18/16   138 with esr 44  - CT 01/05/2017   Extensive partially calcified mediastinal and bilateral hilar lymphadenopathy with extensive perilymphatic nodularity throughout the lungs bilaterally (right greater than left). This spectrum of imaging findings is compatible with the clinically suspected sarcoidosis.       01/06/2017  f/u ov/Sean Sharp re: probable sarcoid / ?uacs on gerd rx  Chief Complaint  Patient presents with  . Follow-up    Pt hwew to discuss CT scan, He still has occ. sob,he is still coughing which causes him some dizziness,   rash has evolved over several years worse area R neck attributed to reaction to otc cream but noted over other parts of face where  no cream was used  Cough is better but still requires rewwq tessilon All symptoms a lot better (x for the rash) while on short courses of prednisone New problem is painful swelling R elbow s trauma hx   No obvious day to day or daytime variability or assoc excess/ purulent sputum or mucus plugs or hemoptysis or cp or chest tightness, subjective wheeze or overt sinus or hb symptoms. No unusual exp hx or h/o childhood pna/ asthma or knowledge of premature birth.  Sleeping ok without nocturnal  or early am exacerbation  of respiratory  c/o's or need for noct saba. Also denies any obvious fluctuation of symptoms with weather or environmental changes or other aggravating or alleviating factors except as outlined above   Current Medications, Allergies, Complete Past Medical History, Past Surgical History, Family History, and Social History were reviewed in [employer] record.  ROS  The following are not active complaints unless bolded sore throat, dysphagia, dental problems, itching, sneezing,  nasal congestion or excess/ purulent secretions, ear ache,   fever, chills, sweats, unintended wt loss, classically pleuritic or exertional cp,  orthopnea pnd or leg swelling, presyncope, palpitations, abdominal pain, anorexia, nausea, vomiting, diarrhea  or change in bowel or bladder habits, change in stools or urine, dysuria,hematuria,  rash, arthralgias, visual complaints, headache, numbness, weakness or ataxia or problems with walking or coordination,  change in mood/affect or memory.                  Objective:   Physical Exam    amb bm nad slt harsh voice texture    01/06/2017       281   12/11/16 278 lb (126.1 kg)  08/29/16 278 lb (126.1 kg)  02/29/16 279 lb (126.6 kg)    Vital signs reviewed  - Note on arrival 02 sats  94 % on RA      HEENT: nl dentition, turbinates bilaterally, and oropharynx. Nl external ear canals without cough reflex  NECK :  without  JVD/Nodes/TM/ nl carotid upstrokes bilaterally   LUNGS: no acc muscle use,  Nl contour chest which is clear to A and P bilaterally with cough on insp  maneuvers   CV:  RRR  no s3 or murmur or increase in P2, and no edema   ABD:  soft and nontender with nl inspiratory excursion in the supine position. No bruits or organomegaly appreciated, bowel sounds nl  MS:  Nl gait/ ext warm with classic R olecranon bursitis x tangerine size , no calf tenderness, cyanosis or clubbing No obvious joint restrictions   SKIN: warm and dry with crusty purplish papular rash coalesced R neck is isolated papules scattered over face as well    NEURO:  alert, approp, nl sensorium with  no motor or cerebellar deficits apparent.       I personally reviewed images and agree with radiology impression as follows:   Chest CT with contrast 12/26/16 . Extensive partially calcified mediastinal and bilateral hilar lymphadenopathy with extensive perilymphatic nodularity throughout the lungs bilaterally (right greater than left). This spectrum of imaging findings is compatible with the clinically suspected sarcoidosis.     Assessment:

## 2017-01-07 NOTE — Assessment & Plan Note (Signed)
Referred to ortho 01/06/2017

## 2017-01-07 NOTE — Assessment & Plan Note (Signed)
Body mass index is 36.1 kg/m.  -  trending up after recent pred rx  Lab Results  Component Value Date   TSH 2.91 02/28/2016    Will be a challenge going forward depending on how much/long he needs pred. Contributing to gerd risk/ doe/reviewed the need and the process to achieve and maintain neg calorie balance > defer f/u primary care including intermittently monitoring thyroid status

## 2017-01-07 NOTE — Assessment & Plan Note (Signed)
See Cxr 12/11/2016 > rx augmentin x 10 days and pred x 6 and f/u with cxr / ov in 2 weeks > canceled appt  And cxr  No change 12/29/16 so rec CT - Angiotensin  12/18/16   138 with esr 44  - CT 01/05/2017   Extensive partially calcified mediastinal and bilateral hilar lymphadenopathy with extensive perilymphatic nodularity throughout the lungs bilaterally (right greater than left). This spectrum of imaging findings is compatible with the clinically suspected Sarcoidosis.  - Rx pred "prn" pending tissues dx 01/06/2017 >>>  I had an extended discussion with the patient/wife reviewing all relevant studies completed to date and  lasting 15 to 20 minutes of a 25 minute visit on the following ongoing concerns:   1) the ct and skin findings are almost certainly all explained by sarcoid but need tissue dx before committing to long term systemic steroid rx > try derm first   2) etiology/ natural hx and treatment options reviewed : The goal with a chronic steroid dependent illness is always arriving at the lowest effective dose that controls the disease/symptoms and not accepting a set "formula" which is based on statistics or guidelines that don't always take into account patient  variability or the natural hx of the dz in every individual patient, which may well vary over time.  For now therefore I recommend the patient maintain  Off pred but if symptoms not tolerable :  pred 20 mg per day until better then 10 mg per day x one week and off again until we have tissue dx  3) Each maintenance medication was reviewed in detail including most importantly the difference between maintenance and as needed and under what circumstances the prns are to be used.  Please see AVS for specific  Instructions which are unique to this visit and I personally typed out  which were reviewed in detail in writing with the patient and a copy provided.

## 2017-01-19 ENCOUNTER — Other Ambulatory Visit: Payer: Self-pay | Admitting: Family Medicine

## 2017-01-19 NOTE — Telephone Encounter (Signed)
[facility] [address]

## 2017-01-21 ENCOUNTER — Encounter: Payer: Self-pay | Admitting: Family Medicine

## 2017-01-21 ENCOUNTER — Encounter: Payer: Self-pay | Admitting: Internal Medicine

## 2017-01-21 ENCOUNTER — Telehealth: Payer: Self-pay | Admitting: Internal Medicine

## 2017-01-21 NOTE — Telephone Encounter (Signed)
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 Recommend patient start Plaquenel 231m daily asap- follow up in approx 1 month.  lmtcb X1 for pt to make aware of results/recs.  Will call in rx after speaking to pt and verifying pharmacy.

## 2017-01-26 NOTE — Telephone Encounter (Signed)
LMTCB

## 2017-01-28 NOTE — Telephone Encounter (Signed)
LMTCB

## 2017-01-30 MED ORDER — HYDROXYCHLOROQUINE SULFATE 200 MG PO TABS: 200.0000 mg | ORAL_TABLET | Freq: Every day | ORAL | 3 refills | Status: DC

## 2017-01-30 NOTE — Telephone Encounter (Signed)
Called spoke with patient and discussed skin biopsy results and recommendations as stated by MW Pt verbalized his understanding and denied any questions/concerns Appt scheduled for 10.2.18 @ 1445 Rx sent to verified pharmacy Appt card mailed to verified home address as requested by the patient  Nothing further needed; will sign off

## 2017-02-05 ENCOUNTER — Other Ambulatory Visit: Payer: Self-pay | Admitting: Internal Medicine

## 2017-02-05 DIAGNOSIS — R918 Other nonspecific abnormal finding of lung field: Secondary | ICD-10-CM

## 2017-02-07 DIAGNOSIS — R7303 Prediabetes: Secondary | ICD-10-CM

## 2017-02-07 HISTORY — DX: Prediabetes: R73.03

## 2017-02-20 ENCOUNTER — Ambulatory Visit: Payer: 59 | Admitting: Internal Medicine

## 2017-03-06 ENCOUNTER — Ambulatory Visit (INDEPENDENT_AMBULATORY_CARE_PROVIDER_SITE_OTHER): Payer: 59 | Admitting: Family Medicine

## 2017-03-06 ENCOUNTER — Encounter: Payer: Self-pay | Admitting: Family Medicine

## 2017-03-06 ENCOUNTER — Ambulatory Visit: Payer: 59 | Admitting: Family Medicine

## 2017-03-06 VITALS — BP 120/77 | HR 73 | Temp 98.1°F | Resp 16 | Ht 74.0 in | Wt 287.0 lb

## 2017-03-06 DIAGNOSIS — Z7952 Long term (current) use of systemic steroids: Secondary | ICD-10-CM

## 2017-03-06 DIAGNOSIS — Z0000 Encounter for general adult medical examination without abnormal findings: Secondary | ICD-10-CM

## 2017-03-06 LAB — BASIC METABOLIC PANEL
BUN: 19 mg/dL (ref 6–23)
CALCIUM: 9.7 mg/dL (ref 8.4–10.5)
CO2: 28 mEq/L (ref 19–32)
Chloride: 99 mEq/L (ref 96–112)
Creatinine, Ser: 1.31 mg/dL (ref 0.40–1.50)
GFR: 77.33 mL/min (ref >60.00)
GLUCOSE: 99 mg/dL (ref 70–99)
POTASSIUM: 4.1 meq/L (ref 3.5–5.1)
Sodium: 135 mEq/L (ref 135–145)

## 2017-03-06 LAB — LIPID PANEL
CHOL/HDL RATIO: 5
CHOLESTEROL: 170 mg/dL (ref 0–200)
HDL: 34 mg/dL — ABNORMAL LOW (ref >39.00)
LDL Cholesterol: 116 mg/dL — ABNORMAL HIGH (ref 0–99)
NonHDL: 136.31
TRIGLYCERIDES: 101 mg/dL (ref 0.0–149.0)
VLDL: 20.2 mg/dL (ref 0.0–40.0)

## 2017-03-06 LAB — HEMOGLOBIN A1C: Hgb A1c MFr Bld: 6.3 % (ref 4.6–6.5)

## 2017-03-06 LAB — TSH: TSH: 2.25 u[IU]/mL (ref 0.35–4.50)

## 2017-03-06 NOTE — Patient Instructions (Signed)

## 2017-03-06 NOTE — Progress Notes (Signed)
Office Note 03/06/2017  CC:  Chief Complaint  Patient presents with  . Annual Exam    Pt is fasting.     HPI:  Sean Sharp is a 41 y.o.  male who is here for annual health maintenance exam. 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  Pulm started him on plaquenil 200 mg daily. Has f/u with Dr. Melvyn Novas next week.  Feeling better regarding cough/resp sx's.  Past Medical History:  Diagnosis Date  . Allergic rhinitis   . 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  Hemoglobin electrophoresis normal 2016.  Marland Kitchen Chronic renal insufficiency, stage II (mild)   . Hoarseness 2015   [facility] 04/2014-normal laryngoscopy: prednisone taper and bid nexium recommended.  No signif help so pt got 2nd opinion at [facility]   . HTN (hypertension)   . Hydrocele    left  . LPRD (laryngopharyngeal reflux disease)   . Peripheral edema    left leg>right leg  . 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 Melvyn Novas suspects it was smoldering since 2011.  Plan is to try to get skin tissue dx as of 01/2017.  . Vocal cord polyps 2015   surgery [facility]     Past Surgical History:  Procedure Laterality Date  . SKIN GRAFT  2010   on left index finger  . Vocal cord surgery  2016   [facility]  . WISDOM TOOTH EXTRACTION  2005    Family History  Problem Relation Age of Onset  . Sarcoidosis Father        ?  Marland Kitchen Lactose intolerance Father   . Other Mother        colon issues-portion of colon removed  . Lactose intolerance Mother   . Cancer Paternal Grandfather        ?    Social History   Social History  . Marital status: Married    Spouse name: N/A  . Number of children: 2  . Years of education: N/A   Occupational History  . electrician    Social History Main Topics  . Smoking status: Former Smoker    Packs/day: 0.30    Years: 1.50    Types: Cigarettes    Quit date: 06/10/1995  . Smokeless tobacco: Never Used  . Alcohol use Yes     Comment: occasional  . Drug use: No  . Sexual  activity: Not on file   Other Topics Concern  . Not on file   Social History Narrative   Married, 2 children (26 and 12 y/o).   Occupation: Clinical biochemist Editor, commissioning).   Orig from [facility].   No Tobacco.  Rare alcohol.  No drugs.   Enjoys fishing, Environmental health practitioner, wood working, Research officer, trade union.          Outpatient Medications Prior to Visit  Medication Sig Dispense Refill  . amLODipine (NORVASC) 10 MG tablet Take 1 tablet (10 mg total) by mouth daily. 90 tablet 3  . Azelastine-Fluticasone 137-50 MCG/ACT SUSP 1 spray in each nostril twice daily 1 Bottle 11  . hydrochlorothiazide (HYDRODIURIL) 25 MG tablet TAKE 1 TABLET (25 MG TOTAL) BY MOUTH DAILY. 90 tablet 1  . hydroxychloroquine (PLAQUENIL) 200 MG tablet Take 1 tablet (200 mg total) by mouth daily. 30 tablet 3  . irbesartan (AVAPRO) 300 MG tablet TAKE 1 TABLET (300 MG TOTAL) BY MOUTH DAILY. 90 tablet 1  . metoprolol succinate (TOPROL-XL) 25 MG 24 hr tablet Take 1 tablet (25 mg  total) by mouth daily. 90 tablet 3  . pantoprazole (PROTONIX) 40 MG tablet Take 30- 60 min before your first and last meals of the day 30 tablet 2  . predniSONE (DELTASONE) 10 MG tablet TAKE 2 TABLETS EVERY MORNING UNTIL BETTER THEN 1 TABLET DAILY FOR 1 WEEK AND STOP 100 tablet 0  . benzonatate (TESSALON) 200 MG capsule Take 1 capsule (200 mg total) by mouth 3 (three) times daily as needed for cough. (Patient not taking: Reported on 03/06/2017) 45 capsule 2   No facility-administered medications prior to visit.     Allergies  Allergen Reactions  . Bee Venom Swelling    ROS Review of Systems  Constitutional: Negative for appetite change, chills, fatigue and fever.  HENT: Positive for congestion (chronic rhinitis). Negative for dental problem, ear pain and sore throat.   Eyes: Negative for discharge, redness and visual disturbance.  Respiratory: Negative for cough, chest tightness, shortness of breath and wheezing.   Cardiovascular: Negative for  chest pain, palpitations and leg swelling.  Gastrointestinal: Negative for abdominal pain, blood in stool, diarrhea, nausea and vomiting.  Genitourinary: Negative for difficulty urinating, dysuria, flank pain, frequency, hematuria and urgency.  Musculoskeletal: Negative for arthralgias, back pain, joint swelling, myalgias and neck stiffness.  Skin: Positive for rash (chronic plaques on R lower face/neck). Negative for pallor.  Neurological: Negative for dizziness, speech difficulty, weakness and headaches.  Hematological: Negative for adenopathy. Does not bruise/bleed easily.  Psychiatric/Behavioral: Negative for confusion and sleep disturbance. The patient is not nervous/anxious.     PE; Blood pressure 120/77, pulse 73, temperature 98.1 F (36.7 C), temperature source Oral, resp. rate 16, height _0  (1.88 m), weight 287 lb (130.2 kg), SpO2 95 %. Gen: Alert, well appearing.  Patient is oriented to person, place, time, and situation. AFFECT: pleasant, lucid thought and speech. ENT: Ears: EACs clear, normal epithelium.  TMs with good light reflex and landmarks bilaterally.  Eyes: no injection, icteris, swelling, or exudate.  EOMI, PERRLA. Nose: no drainage or turbinate edema/swelling.  No injection or focal lesion.  Mouth: lips without lesion/swelling.  Oral mucosa pink and moist.  Dentition intact and without obvious caries or gingival swelling.  Oropharynx without erythema, exudate, or swelling.  Neck: supple/nontender.  No LAD, mass, or TM.  Carotid pulses 2+ bilaterally, without bruits. CV: RRR, no m/r/g.   LUNGS: CTA bilat, nonlabored resps, good aeration in all lung fields. ABD: soft, NT, ND, BS normal.  No hepatospenomegaly or mass.  No bruits. EXT: no clubbing, cyanosis, or edema.  Musculoskeletal: no joint swelling, erythema, warmth, or tenderness.  ROM of all joints intact. Skin - normal except R lower face and upper R neck with well demarcated, irregular shaped, pinkish raised  plaques.  Pertinent labs:  Lab Results  Component Value Date   TSH 2.91 02/28/2016   Lab Results  Component Value Date   WBC 7.8 12/18/2016   HGB 13.2 12/18/2016   HCT 40.8 12/18/2016   MCV 66.5 Repeated and verified X2. (L) 12/18/2016   PLT 288.0 12/18/2016   Lab Results  Component Value Date   CREATININE 1.49 12/18/2016   BUN 26 (H) 12/18/2016   NA 134 (L) 12/18/2016   K 3.9 12/18/2016   CL 100 12/18/2016   CO2 26 12/18/2016   Lab Results  Component Value Date   ALT 19 12/18/2016   AST 18 12/18/2016   ALKPHOS 56 12/18/2016   BILITOT 0.4 12/18/2016   Lab Results  Component Value Date  CHOL 157 02/28/2016   Lab Results  Component Value Date   HDL 29 (L) 02/28/2016   Lab Results  Component Value Date   LDLCALC 110 02/28/2016   Lab Results  Component Value Date   TRIG 91 02/28/2016   Lab Results  Component Value Date   CHOLHDL 5.4 (H) 02/28/2016   Lab Results  Component Value Date   PSA 0.79 08/25/2014    ASSESSMENT AND PLAN:   Health maintenance exam:  Reviewed age and gender appropriate health maintenance issues (prudent diet, regular exercise, health risks of tobacco and excessive alcohol, use of seatbelts, fire alarms in home, use of sunscreen).  Also reviewed age and gender appropriate health screening as well as vaccine recommendations. Vaccines: Tdap UTD.  Flu vaccine--pt declined today.  Pneumovax 23 (sarcoidosis dx)-- he wants to consider this. Labs: FLP, TSH, BMET.  A1c today for baseline given his need for ongoing prednisone treatment on/off for sarcoidosis.  Pulm and cutaneous sarcoidosis: doing well now on plaquenil and prn prednisone --monitoring and f/u per Dr. Melvyn Novas in pulm.  An After Visit Summary was printed and given to the patient.  FOLLOW UP:  Return in about 6 months (around 09/03/2017) for routine chronic illness f/u.  Signed:  Crissie Sickles, MD           03/06/2017

## 2017-03-09 ENCOUNTER — Encounter: Payer: Self-pay | Admitting: *Deleted

## 2017-03-10 ENCOUNTER — Ambulatory Visit (INDEPENDENT_AMBULATORY_CARE_PROVIDER_SITE_OTHER): Payer: 59 | Admitting: Internal Medicine

## 2017-03-10 ENCOUNTER — Encounter: Payer: Self-pay | Admitting: Internal Medicine

## 2017-03-10 VITALS — BP 126/84 | HR 80 | Ht 74.0 in | Wt 286.0 lb

## 2017-03-10 DIAGNOSIS — D869 Sarcoidosis, unspecified: Secondary | ICD-10-CM | POA: Diagnosis not present

## 2017-03-10 DIAGNOSIS — R05 Cough: Secondary | ICD-10-CM | POA: Diagnosis not present

## 2017-03-10 DIAGNOSIS — R059 Cough, unspecified: Secondary | ICD-10-CM

## 2017-03-10 NOTE — Patient Instructions (Addendum)
No change plaquenil dose for now    Please schedule a follow up office visit in 6 weeks, call sooner if needed with cxr on return  - consider taper off gerd rx if cough stays gone at next ov

## 2017-03-10 NOTE — Progress Notes (Signed)
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   1997 s sequelae 2006  With exp to foam bad cough resolved w/in 3 days of avoidance and did fine until 2011 really bad cold > chronic cough eval by Dr Joya Gaskins 04/2010 with suspicion for sarcoid (pos in father) but CT with only nonspecific adenopathy  waxes and wanes since then with extensive w/u / rx at [facility] voice center (reviewed in care everywhere)  And much  worse x 09/2016 assoc with hb/ worse with certain foods and some better dymista self referred to pulmonary clinic 12/11/2016    History of Present Illness  12/11/2016 1st [facility] office visit/ [name]   Chief Complaint  [name], and has "passed out" once before due to cough.    cough x 6 years  Citric drinks make it worse  Allergy eval [place] cedar allergy  nexium as walking the door     Kouffman Reflux v Neurogenic Cough Differentiator Reflux Comments  Do you awaken from a sound sleep coughing violently?                            With trouble breathing? No   Do you have choking episodes when you cannot  Get enough air, gasping for air ?              Yes   Do you usually cough when you lie down into  The bed, or when you just lie down to rest ?                          Sometimes depending on what eaten   Do you usually cough after meals or eating?         Yes   Do you cough when (or after) you bend over?    Yes if full   GERD SCORE     Kouffman Reflux v Neurogenic Cough Differentiator Neurogenic   Do you more-or-less cough all day long? Sporadically    Does change of temperature make you cough? Not much   Does laughing or chuckling cause  you to cough? yes   Do fumes (perfume, automobile fumes, burned  Toast, etc.,) cause you to cough ?      Can, esp cooking on grill   Does speaking, singing, or talking on the phone cause you to cough   ?               No    Neurogenic/Airway score      rec Protonix (pantoprazole) 40 mg  Take 30- 60 min before your first and last meals of the day  Continue dymista one twice daily - point toward ear on same side For drainage / throat tickle as need  >>>   take CHLORPHENIRAMINE  4 mg - take one every 4 hours as needed -  For cough >>>   tessilon 200 mg three to four times daily  GERD (REFLUX)   Please remember to go to the x-ray department downstairs  in the basement  for your tests - we will call you with the results when they are available. If not better at 6 weeks you need to return to [facility] - if better then ok to refill meds thru your PCP or return here at 3 months to regroup re need for longterm treatment options - late add:  ? pna in rll/ ? Sarcoid changes also rec:  Return for esr/ cmet/ angiotensin, cbc with diff and rx Augmentin 875 mg take one pill twice daily  X 10 daysAnd  Prednisone 10 mg take  4 each am x 2 days,   2 each am x 2 days,  1 each am x 2 days and stop  And f/u with cxr in 2 weeks =  No change 12/29/16  - Angiotensin  12/18/16   138 with esr 44  - CT 01/05/2017   Extensive partially calcified mediastinal and bilateral hilar lymphadenopathy with extensive perilymphatic nodularity throughout the lungs bilaterally (right greater than left). This spectrum of imaging findings is compatible with the clinically suspected sarcoidosis.       01/06/2017  f/u ov/Oma Marzan re: probable sarcoid / ?uacs on gerd rx  Chief Complaint  Patient presents with  . Follow-up    Pt hwew to discuss CT scan, He still has occ. sob,he is still coughing which causes him some dizziness,   rash has evolved over several years worse area R neck attributed to reaction to otc cream but noted over  other parts of face where no cream was used  Cough is better but still requires rewwq tessilon All symptoms a lot better (x for the rash) while on short courses of prednisone New problem is painful swelling R elbow s trauma hx  rec Continue protonix and dymista as you are If cough or breathing worse, take prednisone 10 mg x 2 until better then 1 daily x one week then stop  We will call you with appt to see dermatology and orthopedics    - Skin bx 01/09/17 :   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 Follow-up    Pt states he occ has a "strange sensation" in the right side of his chest. He states he feels tired most of the time, not much energy.     cough x onset 2011 then worse 09/2016 > resolved p prednisone and stayed gone p starting plaquenil as above Skin changes x 2016 also improving on plaquenil and steroid cream per derm  No need for prednisone since starting plaquenil   Not limited by breathing from desired activities     No obvious day to day or daytime variability or assoc excess/ purulent sputum or mucus plugs or hemoptysis or cp or chest tightness, subjective wheeze or overt sinus or hb symptoms. No unusual exp hx or h/o childhood pna/ asthma or knowledge of premature birth.  Sleeping ok flat without nocturnal  or early am exacerbation  of respiratory  c/o's or need for noct saba. Also denies any obvious fluctuation of symptoms with weather or environmental changes or other aggravating or alleviating factors except as outlined above   Current Allergies, Complete Past Medical History, Past Surgical History, Family History, and Social History were reviewed in [employer] record.  ROS  The following are not active complaints unless bolded Hoarseness, sore  throat, dysphagia, dental problems, itching, sneezing,  nasal  congestion or discharge of excess mucus or purulent secretions, ear ache,   fever, chills, sweats, unintended wt loss or wt gain, classically pleuritic or exertional cp,  orthopnea pnd or leg swelling, presyncope, palpitations, abdominal pain, anorexia, nausea, vomiting, diarrhea  or change in bowel habits or change in bladder habits, change in stools or change in urine, dysuria, hematuria,  rash, arthralgias, visual complaints, headache, numbness, weakness or ataxia or problems with walking or coordination,  change in mood/affect or memory.        Current Meds  Medication Sig  . amLODipine (NORVASC) 10 MG tablet Take 1 tablet (10 mg total) by mouth daily.  . Azelastine-Fluticasone 137-50 MCG/ACT SUSP 1 spray in each nostril twice daily  . hydrochlorothiazide (HYDRODIURIL) 25 MG tablet TAKE 1 TABLET (25 MG TOTAL) BY MOUTH DAILY.  . hydroxychloroquine (PLAQUENIL) 200 MG tablet Take 1 tablet (200 mg total) by mouth daily.  . irbesartan (AVAPRO) 300 MG tablet TAKE 1 TABLET (300 MG TOTAL) BY MOUTH DAILY.  . metoprolol succinate (TOPROL-XL) 25 MG 24 hr tablet Take 1 tablet (25 mg total) by mouth daily.  . pantoprazole (PROTONIX) 40 MG tablet Take 30- 60 min before your first and last meals of the day                      Objective:   Physical Exam    amb bm nad    03/10/2017       286  01/06/2017       281   12/11/16 278 lb (126.1 kg)  08/29/16 278 lb (126.1 kg)  02/29/16 279 lb (126.6 kg)    Vital signs reviewed  - Note on arrival 02 sats  97 % on RA      HEENT: nl dentition, turbinates bilaterally, and oropharynx. Nl external ear canals without cough reflex   NECK :  without JVD/Nodes/TM/ nl carotid upstrokes bilaterally   LUNGS: no acc muscle use,  Nl contour chest which is clear to A and P bilaterally with cough on insp  maneuvers   CV:  RRR  no s3 or murmur or increase in P2, and no edema   ABD:  soft and nontender with nl inspiratory excursion in the supine position. No  bruits or organomegaly appreciated, bowel sounds nl  MS:  Nl gait/ ext warm with classic R olecranon bursa swelling  x golf ball  size , no calf tenderness, cyanosis or clubbing No obvious joint restrictions   SKIN: warm and dry with crusty purplish papular rash coalesced R neck is isolated papules scattered over face def improved vs prev study  NEURO:  alert, approp, nl sensorium with  no motor or cerebellar deficits apparent.       I personally reviewed images and agree with radiology impression as follows:   Chest CT with contrast 12/26/16 Extensive partially calcified mediastinal and bilateral hilar lymphadenopathy with extensive perilymphatic nodularity throughout the lungs bilaterally (right greater than left). This spectrum of imaging findings is compatible with the clinically suspected sarcoidosis.     Assessment:

## 2017-03-10 NOTE — Assessment & Plan Note (Signed)
See Cxr 12/11/2016 > rx augmentin x 10 days and pred x 6 and f/u with cxr / ov in 2 weeks > canceled appt  And cxr  No change 12/29/16 so rec CT - Angiotensin  12/18/16   138 with esr 44  - CT 01/05/2017   Extensive partially calcified mediastinal and bilateral hilar lymphadenopathy with extensive perilymphatic nodularity throughout the lungs bilaterally (right greater than left). This spectrum of imaging findings is compatible with the clinically suspected Sarcoidosis. 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  For now therefore I recommend the patient maintain  Off pred if possbile and if not will need to double the plaquenil, which we may elect to do anyway at next ov p review progress/skin exam/ new cxr on return in 6 weeks    I had an extended discussion with the patient reviewing all relevant studies completed to date and  lasting 15 to 20 minutes of a 25 minute visit    Each maintenance medication was reviewed in detail including most importantly the difference between maintenance and prns and under what circumstances the prns are to be triggered using an action plan format that is not reflected in the computer generated alphabetically organized AVS.    Please see AVS for specific instructions unique to this visit that I personally wrote and verbalized to the the pt in detail and then reviewed with pt  by my nurse highlighting any  changes in therapy recommended at today's visit to their plan of care.

## 2017-03-10 NOTE — Assessment & Plan Note (Signed)
Dates back to 2011 - extensive w/u at [facility] seen 09/26/14 - FENO 12/11/2016  =    18  - rx with max gerd /1st gen h1 12/11/2016 >>> - resolved as of 03/10/2017 p rx with plaqueninl and maint on ppi > no change rec  Consider taper off gerd rx at next ov

## 2017-03-13 ENCOUNTER — Ambulatory Visit: Payer: 59 | Admitting: Internal Medicine

## 2017-03-16 ENCOUNTER — Other Ambulatory Visit: Payer: Self-pay | Admitting: Internal Medicine

## 2017-03-16 MED ORDER — PANTOPRAZOLE SODIUM 40 MG PO TBEC: DELAYED_RELEASE_TABLET | ORAL | 0 refills | Status: DC

## 2017-03-31 ENCOUNTER — Encounter: Payer: Self-pay | Admitting: Family Medicine

## 2017-04-15 ENCOUNTER — Other Ambulatory Visit: Payer: Self-pay | Admitting: Internal Medicine

## 2017-04-24 ENCOUNTER — Ambulatory Visit: Payer: 59 | Admitting: Internal Medicine

## 2017-04-27 ENCOUNTER — Other Ambulatory Visit: Payer: Self-pay | Admitting: Family Medicine

## 2017-06-06 ENCOUNTER — Other Ambulatory Visit: Payer: Self-pay | Admitting: Family Medicine

## 2017-06-06 ENCOUNTER — Other Ambulatory Visit: Payer: Self-pay | Admitting: Internal Medicine

## 2017-06-09 HISTORY — PX: OLECRANON BURSECTOMY: SHX2097

## 2017-07-20 ENCOUNTER — Other Ambulatory Visit: Payer: Self-pay | Admitting: Internal Medicine

## 2017-08-21 ENCOUNTER — Other Ambulatory Visit: Payer: Self-pay | Admitting: Internal Medicine

## 2017-09-04 ENCOUNTER — Ambulatory Visit (INDEPENDENT_AMBULATORY_CARE_PROVIDER_SITE_OTHER): Payer: 59 | Admitting: Family Medicine

## 2017-09-04 ENCOUNTER — Encounter: Payer: Self-pay | Admitting: Family Medicine

## 2017-09-04 VITALS — BP 133/78 | HR 71 | Temp 98.7°F | Resp 16 | Ht 74.0 in | Wt 277.0 lb

## 2017-09-04 DIAGNOSIS — D869 Sarcoidosis, unspecified: Secondary | ICD-10-CM

## 2017-09-04 DIAGNOSIS — I10 Essential (primary) hypertension: Secondary | ICD-10-CM

## 2017-09-04 DIAGNOSIS — J309 Allergic rhinitis, unspecified: Secondary | ICD-10-CM

## 2017-09-04 DIAGNOSIS — R7303 Prediabetes: Secondary | ICD-10-CM

## 2017-09-04 DIAGNOSIS — R0982 Postnasal drip: Secondary | ICD-10-CM

## 2017-09-04 DIAGNOSIS — K219 Gastro-esophageal reflux disease without esophagitis: Secondary | ICD-10-CM | POA: Diagnosis not present

## 2017-09-04 LAB — BASIC METABOLIC PANEL
BUN: 23 mg/dL (ref 6–23)
CO2: 26 mEq/L (ref 19–32)
Calcium: 9.7 mg/dL (ref 8.4–10.5)
Chloride: 100 mEq/L (ref 96–112)
Creatinine, Ser: 1.34 mg/dL (ref 0.40–1.50)
GFR: 75.15 mL/min (ref >60.00)
GLUCOSE: 91 mg/dL (ref 70–99)
POTASSIUM: 4.2 meq/L (ref 3.5–5.1)
SODIUM: 135 meq/L (ref 135–145)

## 2017-09-04 LAB — HEMOGLOBIN A1C: HEMOGLOBIN A1C: 6.3 % (ref 4.6–6.5)

## 2017-09-04 MED ORDER — HYDROCORTISONE BUTYRATE 0.1 % EX CREA: 1.0000 "application " | TOPICAL_CREAM | Freq: Two times a day (BID) | CUTANEOUS | 6 refills | Status: AC

## 2017-09-04 NOTE — Progress Notes (Signed)
OFFICE VISIT  09/04/2017   CC:  Chief Complaint  Patient presents with  . Follow-up    [name], pt is not fasting.   HPI:    Patient is a 42 y.o. African-American male who presents for 6 mo f/u HTN, GERD, and repetitive systemic use for treatment of his sarcoidosis (has prediabetes due to this). Feeling well, working hard as an Clinical biochemist.  HTN: home bp's ---not monitoring.  Very busy, never has time to think about doing this. Diet is not good.   GERD: Still taking pantoprazole once daily.  "I know when I don't take it".  But says he feels like it is not as strong as it used to be. Still having brief episodes of cough, mostly in mornings esp after going outside.  He feels like the postnasal drip he chronically has is the biggest issue.  Still with pretty frequent throat clearing.  Sarcoidosis: controlled much better lately: minimal SOB, fatigue.  Using cream on sarcoidosis skin lesions and it is helping. Following up with Dr. Melvyn Novas in [place].  ROS: fatigue--not using CPAP with any regularity.  Past Medical History:  Diagnosis Date  . Allergic rhinitis   . 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  Hemoglobin electrophoresis normal 2016.  [facility] Chronic renal insufficiency, stage II (mild)   . Hoarseness 2015   [facility] 04/2014-normal laryngoscopy: prednisone taper and bid nexium recommended.  No signif help so pt got 2nd opinion at [facility]   . HTN (hypertension)   . Hydrocele    left  . LPRD (laryngopharyngeal reflux disease)   . Peripheral edema    left leg>right leg  . Prediabetes 02/2017   A1c 6.3% (right when he was starting to take prednisone prn for treatment of his sarcoidosis).  . 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 Melvyn Novas suspects it was smoldering since 2011.  Skin bx of cutaneous lesion confirmed dx of sarcoidosis 02/2017.  Pt improving fall 2018 with plaquenil and prednisone.  . Vocal cord polyps 2015   surgery [facility]     Past Surgical History:  Procedure  Laterality Date  . 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  . SKIN GRAFT  2010   on left index finger  . Vocal cord surgery  2016   [facility]  . [place] EXTRACTION  2005    Outpatient Medications Prior to Visit  Medication Sig Dispense Refill  . amLODipine (NORVASC) 10 MG tablet TAKE 1 TABLET EVERY DAY 90 tablet 3  . Azelastine-Fluticasone 137-50 MCG/ACT SUSP 1 spray in each nostril twice daily 1 Bottle 11  . hydrochlorothiazide (HYDRODIURIL) 25 MG tablet TAKE 1 TABLET (25 MG TOTAL) BY MOUTH DAILY. 90 tablet 1  . hydroxychloroquine (PLAQUENIL) 200 MG tablet TAKE 1 TABLET BY MOUTH EVERY DAY 30 tablet 0  . irbesartan (AVAPRO) 300 MG tablet TAKE 1 TABLET (300 MG TOTAL) BY MOUTH DAILY. 90 tablet 1  . metoprolol succinate (TOPROL-XL) 25 MG 24 hr tablet TAKE 1 TABLET BY MOUTH EVERY DAY 90 tablet 3  . pantoprazole (PROTONIX) 40 MG tablet TAKE 30- 60 MIN BEFORE YOUR FIRST AND LAST MEALS OF THE DAY 180 tablet 0  . hydrocortisone butyrate (LUCOID) 0.1 % CREA cream Apply 1 application topically 2 (two) times daily.    . predniSONE (DELTASONE) 10 MG tablet TAKE 2 TABLETS EVERY MORNING UNTIL BETTER THEN 1 TABLET DAILY FOR 1 WEEK AND STOP (Patient not taking: Reported on 03/10/2017)  100 tablet 0   No facility-administered medications prior to visit.     Allergies  Allergen Reactions  . Bee Venom Swelling    ROS As per HPI  PE: Blood pressure 133/78, pulse 71, temperature 98.7 F (37.1 C), temperature source Oral, resp. rate 16, height _0  (1.88 m), weight 277 lb (125.6 kg), SpO2 97 %. Gen: Alert, well appearing.  Patient is oriented to person, place, time, and situation. AFFECT: pleasant, lucid thought and speech. CV: RRR, no m/r/g.   LUNGS: CTA bilat, nonlabored resps, good aeration in all lung fields. EXT: no clubbing, cyanosis, or edema.   LABS:  Lab Results  Component Value Date   TSH 2.25 03/06/2017   Lab Results  Component Value Date   WBC  7.8 12/18/2016   HGB 13.2 12/18/2016   HCT 40.8 12/18/2016   MCV 66.5 Repeated and verified X2. (L) 12/18/2016   PLT 288.0 12/18/2016   Lab Results  Component Value Date   CREATININE 1.31 03/06/2017   BUN 19 03/06/2017   NA 135 03/06/2017   K 4.1 03/06/2017   CL 99 03/06/2017   CO2 28 03/06/2017   Lab Results  Component Value Date   ALT 19 12/18/2016   AST 18 12/18/2016   ALKPHOS 56 12/18/2016   BILITOT 0.4 12/18/2016   Lab Results  Component Value Date   CHOL 170 03/06/2017   Lab Results  Component Value Date   HDL 34.00 (L) 03/06/2017   Lab Results  Component Value Date   LDLCALC 116 (H) 03/06/2017   Lab Results  Component Value Date   TRIG 101.0 03/06/2017   Lab Results  Component Value Date   CHOLHDL 5 03/06/2017   Lab Results  Component Value Date   PSA 0.79 08/25/2014   Lab Results  Component Value Date   HGBA1C 6.3 03/06/2017    IMPRESSION AND PLAN:  1) HTN: The current medical regimen is effective;  continue present plan and medications. Lytes/cr today.  2) Prediabetes: at least partially steroid-induced.  3) GERD: suspect this is pretty well controlled and is not contributing that much to his chronic cough and throat clearing. PND likely the biggest cause.  He'll continue his dymista.  4) Prediabetes: he has to be on steroid bursts/tapers periodically for his sarcoidosis. Recheck HbA1c today.  5) Sarcoidosis: doing much better regarding pulm and skin involvement. Continue plaquenil as per Dr. Melvyn Novas. I RF'd his hydrocortisone butyrate cream 0.1% today for his skin lesions.  An After Visit Summary was printed and given to the patient.  FOLLOW UP: Return in about 6 months (around 03/07/2018) for annual CPE (fasting).  Signed:  Crissie Sickles, MD           09/04/2017

## 2017-09-07 ENCOUNTER — Encounter: Payer: Self-pay | Admitting: Family Medicine

## 2017-09-12 ENCOUNTER — Other Ambulatory Visit: Payer: Self-pay | Admitting: Family Medicine

## 2017-09-12 ENCOUNTER — Other Ambulatory Visit: Payer: Self-pay | Admitting: Internal Medicine

## 2018-02-23 ENCOUNTER — Telehealth: Payer: Self-pay | Admitting: Internal Medicine

## 2018-02-23 NOTE — Telephone Encounter (Signed)
Called and spoke to patient, appointment made for 02/25/2018 at [facility]. Nothing further needed at this time.

## 2018-02-25 ENCOUNTER — Ambulatory Visit: Payer: 59 | Admitting: Internal Medicine

## 2018-02-25 ENCOUNTER — Ambulatory Visit (INDEPENDENT_AMBULATORY_CARE_PROVIDER_SITE_OTHER)
Admission: RE | Admit: 2018-02-25 | Discharge: 2018-02-25 | Disposition: A | Discharge status: Home / Self Care | Payer: 59 | Source: Ambulatory Visit | Attending: Internal Medicine | Admitting: Internal Medicine

## 2018-02-25 ENCOUNTER — Ambulatory Visit (INDEPENDENT_AMBULATORY_CARE_PROVIDER_SITE_OTHER): Payer: 59 | Admitting: Internal Medicine

## 2018-02-25 ENCOUNTER — Encounter: Payer: Self-pay | Admitting: Internal Medicine

## 2018-02-25 VITALS — BP 152/94 | HR 102 | Ht 72.0 in | Wt 281.0 lb

## 2018-02-25 DIAGNOSIS — R05 Cough: Secondary | ICD-10-CM

## 2018-02-25 DIAGNOSIS — D869 Sarcoidosis, unspecified: Secondary | ICD-10-CM

## 2018-02-25 DIAGNOSIS — R059 Cough, unspecified: Secondary | ICD-10-CM

## 2018-02-25 DIAGNOSIS — M7021 Olecranon bursitis, right elbow: Secondary | ICD-10-CM

## 2018-02-25 DIAGNOSIS — I10 Essential (primary) hypertension: Secondary | ICD-10-CM | POA: Diagnosis not present

## 2018-02-25 NOTE — Patient Instructions (Signed)
Please remember to go to the  x-ray department downstairs in the basement  for your tests - we will call you with the results when they are available and clear you for surgery    If rash worse > return to dermatology   If nasal symptoms worse > ent    See your eye doctor yearly     Follow up here is as needed for flare of sarcoid symptoms as you've had in the past if they continue to require prednisone to control

## 2018-02-25 NOTE — Progress Notes (Signed)
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   1997 s sequelae 2006  With exp to foam bad cough resolved w/in 3 days of avoidance and did fine until 2011 really bad cold > chronic cough eval by Dr Joya Gaskins 04/2010 with suspicion for sarcoid (pos in father) but CT with only nonspecific adenopathy  waxes and wanes since then with extensive w/u / rx at [facility] (reviewed in care everywhere)  And much  worse x 09/2016 assoc with hb/ worse with certain foods and some better dymista self referred to pulmonary clinic 12/11/2016    History of Present Illness  12/11/2016 1st [facility] office visit/ Sean Sharp   Chief Complaint  [name], and has "passed out" once before due to cough.    cough x 6 years  Citric drinks make it worse  Allergy eval [place] [facility] allergy  nexium as walking the door     Kouffman Reflux v Neurogenic Cough Differentiator Reflux Comments  Do you awaken from a sound sleep coughing violently?                            With trouble breathing? No   Do you have choking episodes when you cannot  Get enough air, gasping for air ?              Yes   Do you usually cough when you lie down into  The bed, or when you just lie down to rest ?                          Sometimes depending on what eaten   Do you usually cough after meals or eating?         Yes   Do you cough when (or after) you bend over?    Yes if full   GERD SCORE     Kouffman Reflux v Neurogenic Cough Differentiator Neurogenic   Do you more-or-less cough all day long? Sporadically    Does change of temperature make you cough? Not much   Does laughing or chuckling cause  you to cough? yes   Do fumes (perfume, automobile fumes, burned  Toast, etc.,) cause you to cough ?      Can, esp cooking on grill   Does speaking, singing, or talking on the phone cause you to cough   ?               No    Neurogenic/Airway score      rec Protonix (pantoprazole) 40 mg  Take 30- 60 min before your first and last meals of the day  Continue dymista one twice daily - point toward ear on same side For drainage / throat tickle as need  >>>   take CHLORPHENIRAMINE  4 mg - take one every 4 hours as needed -  For cough >>>   tessilon 200 mg three to four times daily  GERD (REFLUX)   Please remember to go to the x-ray department downstairs  in the basement  for your tests - we will call you with the results when they are available. If not better at 6 weeks you need to return to [facility] - if better then ok to refill meds thru your PCP or return here at 3 months to regroup re need for longterm treatment options - late add:  ? pna in rll/ ? Sarcoid changes also rec:  Return for esr/ cmet/ angiotensin, cbc with diff and rx Augmentin 875 mg take one pill twice daily  X 10 daysAnd  Prednisone 10 mg take  4 each am x 2 days,   2 each am x 2 days,  1 each am x 2 days and stop  And f/u with cxr in 2 weeks =  No change 12/29/16  - Angiotensin  12/18/16   138 with esr 44  - CT 01/05/2017   Extensive partially calcified mediastinal and bilateral hilar lymphadenopathy with extensive perilymphatic nodularity throughout the lungs bilaterally (right greater than left). This spectrum of imaging findings is compatible with the clinically suspected sarcoidosis.       01/06/2017  f/u ov/Sean Sharp re: probable sarcoid / ?uacs on gerd rx  Chief Complaint  Patient presents with  . Follow-up    Pt hwew to discuss CT scan, He still has occ. sob,he is still coughing which causes him some dizziness,   rash has evolved over several years worse area R neck attributed to reaction to otc cream but noted over  other parts of face where no cream was used  Cough is better but still requires rewwq tessilon All symptoms a lot better (x for the rash) while on short courses of prednisone New problem is painful swelling R elbow s trauma hx  rec Continue protonix and dymista as you are If cough or breathing worse, take prednisone 10 mg x 2 until better then 1 daily x one week then stop  We will call you with appt to see dermatology and orthopedics    - Skin bx 01/09/17 :   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 Follow-up    Pt states he occ has a "strange sensation" in the right side of his chest. He states he feels tired most of the time, not much energy.    cough x onset 2011 then worse 09/2016 > resolved p prednisone and stayed gone p starting plaquenil as above Skin changes x 2016 also improving on plaquenil and steroid cream per derm  No need for prednisone since starting plaquenil  rec No change plaquenil dose for now  Please schedule a follow up office visit in 6 weeks, call sooner if needed with cxr on return  - consider taper off gerd rx if cough stays gone at next ov      02/25/2018  Pulmonary consultation: Sean Sharp re: sarcoidosis on "prn prednisone/ did not maintain on plaquenil / needs clearance for R olecranon bursitis Chief Complaint  Patient presents with  . Advice Only    Needing pulm clearance for right elbow surgery. He has occ cough- non prod.     Not limited by breathing from desired activities   Min daytime dry cough attributes to pnds / some better on dymista when can afford it, does not recall resp to 1st gen H1 blockers per guidelines   No ocular complaints  Sleeps flat ok    No obvious day to day or daytime variability or assoc excess/ purulent sputum or mucus plugs or hemoptysis or cp or chest tightness, subjective wheeze or  overt sinus or hb symptoms.   Sleeps ok  without nocturnal  or early am exacerbation  of respiratory  c/o's or need for noct saba. Also denies any obvious fluctuation of symptoms with weather or environmental changes or other aggravating or alleviating factors except as outlined above   No unusual exposure hx or h/o childhood pna/ asthma or knowledge of premature birth.  Current Allergies, Complete Past Medical History, Past Surgical History, Family History, and Social History were reviewed in [employer] record.  ROS  The following are not active complaints unless bolded Hoarseness, sore throat, dysphagia, dental problems, itching, sneezing,  nasal congestion or discharge of excess mucus or purulent secretions, ear ache,   fever, chills, sweats, unintended wt loss or wt gain, classically pleuritic or exertional cp,  orthopnea pnd or arm/hand swelling  or leg swelling, presyncope, palpitations, abdominal pain, anorexia, nausea, vomiting, diarrhea  or change in bowel habits or change in bladder habits, change in stools or change in urine, dysuria, hematuria,  rash, arthralgias, visual complaints, headache, numbness, weakness or ataxia or problems with walking or coordination,  change in mood or  memory.        Current Meds  Not able to confirm this list is accurate   Medication Sig  . amLODipine (NORVASC) 10 MG tablet TAKE 1 TABLET EVERY DAY  . Azelastine-Fluticasone 137-50 MCG/ACT SUSP 1 spray in each nostril twice daily  . hydrochlorothiazide (HYDRODIURIL) 25 MG tablet TAKE 1 TABLET BY MOUTH EVERY DAY  . hydrocortisone butyrate (LUCOID) 0.1 % CREA cream Apply 1 application topically 2 (two) times daily.  . irbesartan (AVAPRO) 300 MG tablet TAKE 1 TABLET (300 MG TOTAL) BY MOUTH DAILY.  . metoprolol succinate (TOPROL-XL) 25 MG 24 hr tablet TAKE 1 TABLET BY MOUTH EVERY DAY  . pantoprazole (PROTONIX) 40 MG tablet TAKE 30- 60 MIN BEFORE YOUR FIRST AND LAST MEALS OF THE DAY                         Objective:   Physical Exam    amb bm nad     02/25/2018       281  03/10/2017       286  01/06/2017       281   12/11/16 278 lb (126.1 kg)  08/29/16 278 lb (126.1 kg)  02/29/16 279 lb (126.6 kg)    Vital signs reviewed - Note on arrival 02 sats  95% on RA   And bp 152/94    HEENT: nl dentition, turbinates bilaterally, and oropharynx. Nl external ear canals without cough reflex   NECK :  without JVD/Nodes/TM/ nl carotid upstrokes bilaterally   LUNGS: no acc muscle use,  Nl contour chest which is clear to A and P bilaterally without cough on insp or exp maneuvers   CV:  RRR  no s3 or murmur or increase in P2, and no edema   ABD:  soft and nontender with nl inspiratory excursion in the supine position. No bruits or organomegaly appreciated, bowel sounds nl  MS:  Nl gait/ ext warm with golf ball sized sts R elbow, min tender, calf tenderness, cyanosis or clubbing No obvious joint restrictions   SKIN: warm and dry with crusty puplish plaques over neck esp R side  NEURO:  alert, approp, nl sensorium with  no motor or cerebellar deficits apparent.           CXR PA and Lateral:   02/25/2018 :    I personally reviewed images and agree with radiology impression as follows:    Stable severe reticulonodular interstitial opacities and right perihilar masslike opacity. Findings most likely related to sarcoidosis. No real change.    Assessment:

## 2018-02-26 NOTE — Progress Notes (Signed)
ATC, NA and no option to leave msg

## 2018-02-27 ENCOUNTER — Encounter: Payer: Self-pay | Admitting: Internal Medicine

## 2018-02-27 ENCOUNTER — Other Ambulatory Visit: Payer: Self-pay | Admitting: Internal Medicine

## 2018-02-27 ENCOUNTER — Other Ambulatory Visit: Payer: Self-pay | Admitting: Family Medicine

## 2018-02-27 NOTE — Assessment & Plan Note (Signed)
Referred to ortho 01/06/2017 > cleared for surgery 02/25/2018

## 2018-02-27 NOTE — Assessment & Plan Note (Signed)
See Cxr 12/11/2016 > rx augmentin x 10 days and pred x 6 and f/u with cxr / ov in 2 weeks > canceled appt  And cxr  No change 12/29/16 so rec CT - Angiotensin  12/18/16   138 with esr 44  - CT 01/05/2017   Extensive partially calcified mediastinal and bilateral hilar lymphadenopathy with extensive perilymphatic nodularity throughout the lungs bilaterally (right greater than left). This spectrum of imaging findings is compatible with the clinically suspected Sarcoidosis. 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/2017 start plaquenil 200 mg daily > pt d/c'd    No evidence of worse lung involvement off plaquenil and using prednisone prn > this is not ideal but pt repeatedly fails to follow specific instructions so rec   1) see derm for worse skin changes > restart plaquenil if needed per derm but note not needed for the pulmonary involvement at present   2) see ent for worse cough (see sep a/p)  3) continue short course pred for flares of sob/ arthritis he attributes to sarcoid as per Dr Bettina Gavia previous recs   4) Cleared for surgery on R elbow but probably should receive stress steroids if gen anesthesia required

## 2018-02-27 NOTE — Assessment & Plan Note (Addendum)
Dates back to 2011 - extensive w/u at [facility] seen 09/26/14 - FENO 12/11/2016  =    18  - rx with max gerd /1st gen h1 12/11/2016 > ? If ever took it ?  - resolved as of 03/10/2017 p rx with plaqueninl and maint on ppi    Cough improved to his satisfaction though not clear what meds he actually uses and attributes to pnds better when takes dymista > f/u ent prn

## 2018-02-27 NOTE — Assessment & Plan Note (Signed)
Not optimally controlled on present regimen. I reviewed this with the patient and emphasized importance of follow-up with primary care > doubt compliant with meds/ diet based on experience with his sarcoid / cough meds     Pulmonary f/u can be prn as he insists on self titration of meds but will need to return here if wants future refills on prednisone.   Each maintenance medication was reviewed in detail including most importantly the difference between maintenance and as needed and under what circumstances the prns are to be used.  Please see AVS for specific  Instructions which are unique to this visit and I personally typed out  which were reviewed in detail in writing with the patient and a copy provided.

## 2018-03-01 NOTE — Progress Notes (Signed)
Spoke with pt and notified of results per Dr. Wert. Pt verbalized understanding and denied any questions. 

## 2018-03-07 ENCOUNTER — Encounter: Payer: Self-pay | Admitting: Family Medicine

## 2018-03-12 ENCOUNTER — Encounter: Payer: 59 | Admitting: Family Medicine

## 2018-03-12 NOTE — Progress Notes (Deleted)
OFFICE VISIT  03/12/2018   CC: No chief complaint on file.    HPI:    Patient is a 42 y.o.  male who's primary medical problems are sarcoidosis and HTN, presents for annual health maintenance exam.  Past Medical History:  Diagnosis Date  . Allergic rhinitis   . 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  Hemoglobin electrophoresis normal 2016.  Marland Kitchen Chronic renal insufficiency, stage II (mild)   . Hoarseness 2015   [place] [facility] 04/2014-normal laryngoscopy: prednisone taper and bid nexium recommended.  No signif help so pt got 2nd opinion at [facility]   . HTN (hypertension)   . Hydrocele    left  . LPRD (laryngopharyngeal reflux disease)   . Olecranon bursitis of right elbow    persistent/recurrent s/p aspiration-->plan for bursectomy as of 02/17/18 ortho f/u  . Peripheral edema    left leg>right leg  . Prediabetes 02/2017   A1c 6.3% (right when he was starting to take prednisone prn for treatment of his sarcoidosis).  HbA1c 08/2017=6.3%.  . 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 Melvyn Novas suspects it was smoldering since 2011.  Skin bx of cutaneous lesion confirmed dx of sarcoidosis 02/2017.  Pt improving fall 2018 with plaquenil and prednisone.  . Vocal cord polyps 2015   surgery [facility]     Past Surgical History:  Procedure Laterality Date  . 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  . SKIN GRAFT  2010   on left index finger  . Vocal cord surgery  2016   [facility]  . WISDOM TOOTH EXTRACTION  2005   Family History  Problem Relation Age of Onset  . Sarcoidosis Father        ?  Marland Kitchen Lactose intolerance Father   . Other Mother        colon issues-portion of colon removed  . Lactose intolerance Mother   . Cancer Paternal Grandfather        ?   Social History   Socioeconomic History  . Marital status: Married    Spouse name: Not on file  . Number of children: 2  . Years of education: Not on file  . Highest education level: Not on file   Occupational History  . Occupation: Clinical biochemist  Social Needs  . Financial resource strain: Not on file  . Food insecurity:    Worry: Not on file    Inability: Not on file  . Transportation needs:    Medical: Not on file    Non-medical: Not on file  Tobacco Use  . Smoking status: Former Smoker    Packs/day: 0.30    Years: 1.50    Pack years: 0.45    Types: Cigarettes    Last attempt to quit: 06/10/1995    Years since quitting: 22.7  . Smokeless tobacco: Never Used  Substance and Sexual Activity  . Alcohol use: Yes    Comment: occasional  . Drug use: No  . Sexual activity: Not on file  Lifestyle  . Physical activity:    Days per week: Not on file    Minutes per session: Not on file  . Stress: Not on file  Relationships  . Social connections:    Talks on phone: Not on file    Gets together: Not on file    Attends religious service: Not on file    Active member of club or organization: Not on file    Attends  meetings of clubs or organizations: Not on file    Relationship status: Not on file  Other Topics Concern  . Not on file  Social History Narrative   Married, 2 children (52 and 54 y/o).   Occupation: Clinical biochemist Editor, commissioning).   Orig from [facility].   No Tobacco.  Rare alcohol.  No drugs.   Enjoys fishing, Environmental health practitioner, wood working, Research officer, trade union.       Outpatient Medications Prior to Visit  Medication Sig Dispense Refill  . amLODipine (NORVASC) 10 MG tablet TAKE 1 TABLET EVERY DAY 90 tablet 3  . DYMISTA 137-50 MCG/ACT SUSP INSTILL 1 SPRAY IN EACH NOSTRIL TWICE DAILY 1 Bottle 5  . hydrochlorothiazide (HYDRODIURIL) 25 MG tablet TAKE 1 TABLET BY MOUTH EVERY DAY 90 tablet 1  . hydrocortisone butyrate (LUCOID) 0.1 % CREA cream Apply 1 application topically 2 (two) times daily. 60 g 6  . irbesartan (AVAPRO) 300 MG tablet TAKE 1 TABLET (300 MG TOTAL) BY MOUTH DAILY. 90 tablet 1  . metoprolol succinate (TOPROL-XL) 25 MG 24 hr tablet TAKE 1 TABLET BY MOUTH  EVERY DAY 90 tablet 3  . pantoprazole (PROTONIX) 40 MG tablet TAKE 30- 60 MIN BEFORE YOUR FIRST AND LAST MEALS OF THE DAY 180 tablet 0   No facility-administered medications prior to visit.     Allergies  Allergen Reactions  . Bee Venom Swelling    ROS As per HPI  PE: There were no vitals taken for this visit. ***  LABS:  Lab Results  Component Value Date   TSH 2.25 03/06/2017   Lab Results  Component Value Date   WBC 7.8 12/18/2016   HGB 13.2 12/18/2016   HCT 40.8 12/18/2016   MCV 66.5 Repeated and verified X2. (L) 12/18/2016   PLT 288.0 12/18/2016   Lab Results  Component Value Date   CREATININE 1.34 09/04/2017   BUN 23 09/04/2017   NA 135 09/04/2017   K 4.2 09/04/2017   CL 100 09/04/2017   CO2 26 09/04/2017   Lab Results  Component Value Date   ALT 19 12/18/2016   AST 18 12/18/2016   ALKPHOS 56 12/18/2016   BILITOT 0.4 12/18/2016   Lab Results  Component Value Date   CHOL 170 03/06/2017   Lab Results  Component Value Date   HDL 34.00 (L) 03/06/2017   Lab Results  Component Value Date   LDLCALC 116 (H) 03/06/2017   Lab Results  Component Value Date   TRIG 101.0 03/06/2017   Lab Results  Component Value Date   CHOLHDL 5 03/06/2017   Lab Results  Component Value Date   PSA 0.79 08/25/2014     IMPRESSION AND PLAN:  Health maintenance exam: Reviewed age and gender appropriate health maintenance issues (prudent diet, regular exercise, health risks of tobacco and excessive alcohol, use of seatbelts, fire alarms in home, use of sunscreen).  Also reviewed age and gender appropriate health screening as well as vaccine recommendations. Vaccines: Tdap due-->      Flu vacc-->pt declines. Labs: fasting HP labs. Prostate ca screening: average risk patient= as per latest guidelines, start screening at 45-50 yrs of age. Colon ca screening: average risk patient= as per latest guidelines, start screening at 45-50 yrs of age.  An After Visit Summary  was printed and given to the patient.  FOLLOW UP: No follow-ups on file.   Signed:  Crissie Sickles, MD           03/12/2018

## 2018-03-15 ENCOUNTER — Encounter: Payer: Self-pay | Admitting: Family Medicine

## 2018-03-29 ENCOUNTER — Encounter: Payer: Self-pay | Admitting: Family Medicine

## 2018-03-30 ENCOUNTER — Other Ambulatory Visit: Payer: Self-pay | Admitting: Internal Medicine

## 2018-04-01 ENCOUNTER — Telehealth: Payer: Self-pay | Admitting: Internal Medicine

## 2018-04-01 ENCOUNTER — Other Ambulatory Visit: Payer: Self-pay | Admitting: Internal Medicine

## 2018-04-01 NOTE — Telephone Encounter (Signed)
We refilled the pred 100 tablets today per his request  Per MW- needs appt for any additional refills on this  [facility]

## 2018-04-01 NOTE — Telephone Encounter (Signed)
Dr Melvyn Novas- did you want him to stay on the pred I can't tell by looking at the problem list under sarcoid or AVS    Please remember to go to the  x-ray department downstairs in the basement  for your tests - we will call you with the results when they are available and clear you for surgery      If rash worse > return to dermatology    If nasal symptoms worse > ent      See your eye doctor yearly

## 2018-04-02 NOTE — Telephone Encounter (Signed)
Patient returning call Phone number is [phone].

## 2018-04-02 NOTE — Telephone Encounter (Signed)
LMTCB. Patient needs to make an appt.

## 2018-04-05 NOTE — Telephone Encounter (Signed)
Spoke with the pt and scheduled ov with MW for 05/10/18 at 9:15 am

## 2018-04-06 ENCOUNTER — Telehealth: Payer: Self-pay | Admitting: Internal Medicine

## 2018-04-06 NOTE — Telephone Encounter (Signed)
Called and spoke to patient, patient states he was given surgical clearance at his last OV and he needs the paperwork. AVS states patient has been cleared for surgery, paperwork printed and placed up front for pick up.  Patient aware. Voiced understanding. Nothing further is needed at this time.

## 2018-04-20 ENCOUNTER — Encounter: Payer: Self-pay | Admitting: Family Medicine

## 2018-04-20 ENCOUNTER — Ambulatory Visit (INDEPENDENT_AMBULATORY_CARE_PROVIDER_SITE_OTHER): Payer: 59 | Admitting: Family Medicine

## 2018-04-20 VITALS — BP 112/73 | HR 70 | Temp 98.7°F | Resp 16 | Ht 72.0 in | Wt 284.2 lb

## 2018-04-20 DIAGNOSIS — R7303 Prediabetes: Secondary | ICD-10-CM | POA: Diagnosis not present

## 2018-04-20 DIAGNOSIS — Z23 Encounter for immunization: Secondary | ICD-10-CM | POA: Diagnosis not present

## 2018-04-20 DIAGNOSIS — G4733 Obstructive sleep apnea (adult) (pediatric): Secondary | ICD-10-CM | POA: Diagnosis not present

## 2018-04-20 DIAGNOSIS — Z0000 Encounter for general adult medical examination without abnormal findings: Secondary | ICD-10-CM

## 2018-04-20 DIAGNOSIS — E669 Obesity, unspecified: Secondary | ICD-10-CM

## 2018-04-20 DIAGNOSIS — I10 Essential (primary) hypertension: Secondary | ICD-10-CM

## 2018-04-20 DIAGNOSIS — Z114 Encounter for screening for human immunodeficiency virus [HIV]: Secondary | ICD-10-CM | POA: Diagnosis not present

## 2018-04-20 DIAGNOSIS — Z9989 Dependence on other enabling machines and devices: Secondary | ICD-10-CM

## 2018-04-20 DIAGNOSIS — G4700 Insomnia, unspecified: Secondary | ICD-10-CM | POA: Diagnosis not present

## 2018-04-20 LAB — COMPREHENSIVE METABOLIC PANEL
ALBUMIN: 4.2 g/dL (ref 3.5–5.2)
ALK PHOS: 51 U/L (ref 39–117)
ALT: 18 U/L (ref 0–53)
AST: 20 U/L (ref 0–37)
BUN: 22 mg/dL (ref 6–23)
CALCIUM: 9.6 mg/dL (ref 8.4–10.5)
CHLORIDE: 99 meq/L (ref 96–112)
CO2: 29 mEq/L (ref 19–32)
Creatinine, Ser: 1.32 mg/dL (ref 0.40–1.50)
GFR: 76.24 mL/min (ref >60.00)
Glucose, Bld: 95 mg/dL (ref 70–99)
POTASSIUM: 3.9 meq/L (ref 3.5–5.1)
Sodium: 137 mEq/L (ref 135–145)
TOTAL PROTEIN: 7.9 g/dL (ref 6.0–8.3)
Total Bilirubin: 0.6 mg/dL (ref 0.2–1.2)

## 2018-04-20 LAB — CBC WITH DIFFERENTIAL/PLATELET
Basophils Absolute: 0 10*3/uL (ref 0.0–0.1)
Basophils Relative: 0.6 % (ref 0.0–3.0)
Eosinophils Absolute: 0.1 10*3/uL (ref 0.0–0.7)
Eosinophils Relative: 1.3 % (ref 0.0–5.0)
HCT: 46 % (ref 39.0–52.0)
Hemoglobin: 14.5 g/dL (ref 13.0–17.0)
Lymphocytes Relative: 5.2 % — ABNORMAL LOW (ref 12.0–46.0)
Lymphs Abs: 0.4 10*3/uL — ABNORMAL LOW (ref 0.7–4.0)
MCHC: 31.6 g/dL (ref 30.0–36.0)
MCV: 67.3 fl — ABNORMAL LOW (ref 78.0–100.0)
Monocytes Absolute: 0.9 10*3/uL (ref 0.1–1.0)
Monocytes Relative: 12 % (ref 3.0–12.0)
Neutro Abs: 6.2 10*3/uL (ref 1.4–7.7)
Neutrophils Relative %: 80.9 % — ABNORMAL HIGH (ref 43.0–77.0)
Platelets: 263 10*3/uL (ref 150.0–400.0)
RBC: 6.83 Mil/uL — ABNORMAL HIGH (ref 4.22–5.81)
RDW: 19 % — ABNORMAL HIGH (ref 11.5–15.5)
WBC: 7.7 10*3/uL (ref 4.0–10.5)

## 2018-04-20 LAB — LIPID PANEL
CHOLESTEROL: 191 mg/dL (ref 0–200)
HDL: 40.7 mg/dL (ref >39.00)
LDL CALC: 131 mg/dL — AB (ref 0–99)
NonHDL: 150.28
TRIGLYCERIDES: 96 mg/dL (ref 0.0–149.0)
Total CHOL/HDL Ratio: 5
VLDL: 19.2 mg/dL (ref 0.0–40.0)

## 2018-04-20 LAB — HEMOGLOBIN A1C: Hgb A1c MFr Bld: 6.2 % (ref 4.6–6.5)

## 2018-04-20 LAB — TSH: TSH: 2.75 u[IU]/mL (ref 0.35–4.50)

## 2018-04-20 MED ORDER — TRAZODONE HCL 50 MG PO TABS: ORAL_TABLET | ORAL | 1 refills | Status: DC

## 2018-04-20 NOTE — Progress Notes (Signed)
Office Note 04/20/2018  CC:  Chief Complaint  Patient presents with  . Annual Exam    Pt is fasting.     HPI:  Sean Sharp is a 42 y.o.  male who is here for annual health maintenance exam.  Doing ok since getting back on prednisone for his sarcoidosis (doing a 14d taper).   Cough and fatigue are his chief symptoms when he has a flare.   Mucous production is minimal from flares, but his PND sometimes.  Exercise: none Diet: trying to cut back on carbs.  Insomnia:  Some nights, not all.  Sounds like sleep hygiene is a problem due to his work schedule and his wife needing the TV on to drown out his snoring. Melatonin helping a little bit.  He says he has never been on a rx sleep aid.  He has OSA, wears CPAP but he is questioning whether it helps or not. He is not forthcoming with details regarding exactly how much he uses his machine. Orig sleep study and CPAP initiation were done at [facility] around the time he was getting vocal cord surgery and he has never followed up with them.  He is interested in getting this condition followed locally, asks for referral to sleep MD.    Past Medical History:  Diagnosis Date  . Allergic rhinitis   . 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  Hemoglobin electrophoresis normal 2016.  Marland Kitchen Chronic renal insufficiency, stage II (mild)   . Hoarseness 2015   [place] [facility] 04/2014-normal laryngoscopy: prednisone taper and bid nexium recommended.  No signif help so pt got 2nd opinion at [facility]   . HTN (hypertension)   . Hydrocele    left  . LPRD (laryngopharyngeal reflux disease)   . Obesity, Class II, BMI 35-39.9   . Olecranon bursitis of right elbow    persistent/recurrent s/p aspiration-->plan for bursectomy as of 02/17/18 ortho f/u  . Peripheral edema    left leg>right leg  . Prediabetes 02/2017   A1c 6.3% (right when he was starting to take prednisone prn for treatment of his sarcoidosis).  HbA1c 08/2017=6.3%.  . Sarcoidosis of lung ([place])  12/2016   2018, but Dr. Melvyn Novas suspects it was smoldering since 2011.  Skin bx of cutaneous lesion confirmed dx of sarcoidosis 02/2017.  Pt improving fall 2018 with plaquenil and prednisone.  Pt self d/c'd plaquenil, takes pred prn per his own judgement, pt commonly takes meds incorrectly/not as recommended by MDs.  . Vocal cord polyps 2015   surgery [facility]     Past Surgical History:  Procedure Laterality Date  . 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  . SKIN GRAFT  2010   on left index finger  . Vocal cord surgery  2016   [facility]  . WISDOM TOOTH EXTRACTION  2005    Family History  Problem Relation Age of Onset  . Sarcoidosis Father        ?  Marland Kitchen Lactose intolerance Father   . Other Mother        colon issues-portion of colon removed  . Lactose intolerance Mother   . Cancer Paternal Grandfather        ?    Social History   Socioeconomic History  . Marital status: Married    Spouse name: Not on file  . Number of children: 2  . Years of education: Not on file  . Highest education level: Not on file  Occupational History  . Occupation: Clinical biochemist  Social Needs  . Financial resource strain: Not on file  . Food insecurity:    Worry: Not on file    Inability: Not on file  . Transportation needs:    Medical: Not on file    Non-medical: Not on file  Tobacco Use  . Smoking status: Former Smoker    Packs/day: 0.30    Years: 1.50    Pack years: 0.45    Types: Cigarettes    Last attempt to quit: 06/10/1995    Years since quitting: 22.8  . Smokeless tobacco: Never Used  Substance and Sexual Activity  . Alcohol use: Yes    Comment: occasional  . Drug use: No  . Sexual activity: Not on file  Lifestyle  . Physical activity:    Days per week: Not on file    Minutes per session: Not on file  . Stress: Not on file  Relationships  . Social connections:    Talks on phone: Not on file    Gets together: Not on file    Attends religious service:  Not on file    Active member of club or organization: Not on file    Attends meetings of clubs or organizations: Not on file    Relationship status: Not on file  . Intimate partner violence:    Fear of current or ex partner: Not on file    Emotionally abused: Not on file    Physically abused: Not on file    Forced sexual activity: Not on file  Other Topics Concern  . Not on file  Social History Narrative   Married, 2 children (49 and 73 y/o).   Occupation: Clinical biochemist Editor, commissioning).   Orig from [employer].   No Tobacco.  Rare alcohol.  No drugs.   Enjoys fishing, Environmental health practitioner, wood working, Research officer, trade union.       Outpatient Medications Prior to Visit  Medication Sig Dispense Refill  . amLODipine (NORVASC) 10 MG tablet TAKE 1 TABLET EVERY DAY 90 tablet 3  . DYMISTA 137-50 MCG/ACT SUSP INSTILL 1 SPRAY IN EACH NOSTRIL TWICE DAILY 1 Bottle 5  . hydrochlorothiazide (HYDRODIURIL) 25 MG tablet TAKE 1 TABLET BY MOUTH EVERY DAY 90 tablet 1  . hydrocortisone butyrate (LUCOID) 0.1 % CREA cream Apply 1 application topically 2 (two) times daily. 60 g 6  . hydroxychloroquine (PLAQUENIL) 200 MG tablet Take 200 mg by mouth daily.  0  . metoprolol succinate (TOPROL-XL) 25 MG 24 hr tablet TAKE 1 TABLET BY MOUTH EVERY DAY 90 tablet 3  . pantoprazole (PROTONIX) 40 MG tablet TAKE 30- 60 MIN BEFORE YOUR FIRST AND LAST MEALS OF THE DAY 180 tablet 0  . predniSONE (DELTASONE) 10 MG tablet TAKE 2 TABLETS EVERY MORNING UNTIL BETTER THEN 1 TABLET DAILY FOR 1 WEEK AND STOP 100 tablet 0  . irbesartan (AVAPRO) 300 MG tablet TAKE 1 TABLET (300 MG TOTAL) BY MOUTH DAILY. (Patient not taking: Reported on 04/20/2018) 90 tablet 1   No facility-administered medications prior to visit.     Allergies  Allergen Reactions  . Bee Venom Swelling    ROS Review of Systems  Constitutional: Positive for fatigue. Negative for appetite change, chills and fever.  HENT: Negative for congestion, dental problem, ear  pain and sore throat.   Eyes: Negative for discharge, redness and visual disturbance.  Respiratory: Negative for cough, chest tightness, shortness of breath and wheezing.   Cardiovascular: Negative for chest pain, palpitations and  leg swelling.  Gastrointestinal: Negative for abdominal pain, blood in stool, diarrhea, nausea and vomiting.  Genitourinary: Negative for difficulty urinating, dysuria, flank pain, frequency, hematuria and urgency.  Musculoskeletal: Negative for arthralgias, back pain, joint swelling, myalgias and neck stiffness.  Skin: Negative for pallor and rash.       Sarcoid lesions  Neurological: Negative for dizziness, speech difficulty, weakness and headaches.  Hematological: Negative for adenopathy. Does not bruise/bleed easily.  Psychiatric/Behavioral: Positive for sleep disturbance. Negative for confusion. The patient is not nervous/anxious.     PE; Blood pressure 112/73, pulse 70, temperature 98.7 F (37.1 C), temperature source Oral, resp. rate 16, height 6' (1.829 m), weight 284 lb 4 oz (128.9 kg), SpO2 96 %. Body mass index is 38.55 kg/m.  Gen: Alert, well appearing.  He does appear tired.  Patient is oriented to person, place, time, and situation. AFFECT: pleasant, lucid thought and speech. ENT: Ears: EACs clear, normal epithelium.  TMs with good light reflex and landmarks bilaterally.  Eyes: no injection, icteris, swelling, or exudate.  EOMI, PERRLA. Nose: no drainage or turbinate edema/swelling.  No injection or focal lesion.  Mouth: lips without lesion/swelling.  Oral mucosa pink and moist.  Dentition intact and without obvious caries or gingival swelling.  Oropharynx without erythema, exudate, or swelling.  Neck: supple/nontender.  No LAD, mass, or TM.  Carotid pulses 2+ bilaterally, without bruits. CV: RRR, no m/r/g.   LUNGS: CTA bilat, nonlabored resps, good aeration in all lung fields. ABD: soft, NT, ND, BS normal.  No hepatospenomegaly or mass.  No  bruits. EXT: no clubbing, cyanosis, or edema.  Musculoskeletal: no joint swelling, erythema, warmth, or tenderness.  ROM of all joints intact. Skin - no sores or rashes or color changes.  He has some areas of lumpy hypertrophic nodularity that are smooth, similar to large scars-->located on neck and face.   Pertinent labs:  Lab Results  Component Value Date   TSH 2.25 03/06/2017   Lab Results  Component Value Date   WBC 7.8 12/18/2016   HGB 13.2 12/18/2016   HCT 40.8 12/18/2016   MCV 66.5 Repeated and verified X2. (L) 12/18/2016   PLT 288.0 12/18/2016   Lab Results  Component Value Date   CREATININE 1.34 09/04/2017   BUN 23 09/04/2017   NA 135 09/04/2017   K 4.2 09/04/2017   CL 100 09/04/2017   CO2 26 09/04/2017   Lab Results  Component Value Date   ALT 19 12/18/2016   AST 18 12/18/2016   ALKPHOS 56 12/18/2016   BILITOT 0.4 12/18/2016   Lab Results  Component Value Date   CHOL 170 03/06/2017   Lab Results  Component Value Date   HDL 34.00 (L) 03/06/2017   Lab Results  Component Value Date   LDLCALC 116 (H) 03/06/2017   Lab Results  Component Value Date   TRIG 101.0 03/06/2017   Lab Results  Component Value Date   CHOLHDL 5 03/06/2017   Lab Results  Component Value Date   PSA 0.79 08/25/2014   Lab Results  Component Value Date   HGBA1C 6.3 09/04/2017    ASSESSMENT AND PLAN:   1) Insomnia: dysfunctional sleep hygiene is the biggest issue, but sounds like he is not able to change some of his habits (work schedule, wife needing to drown out his snoring with the TV).  Will try to avoid benzo's since he has OSA. Trial of trazodone initiated today: 67m, 1-2 qhs prn.  2) OSA, has CPAP but  not using consistently. See HPI for details-->refer to pulm sleep MD with [place] today.  3) Health maintenance exam: Reviewed age and gender appropriate health maintenance issues (prudent diet, regular exercise, health risks of tobacco and excessive alcohol, use of  seatbelts, fire alarms in home, use of sunscreen).  Also reviewed age and gender appropriate health screening as well as vaccine recommendations. Vaccines: Tdap due -->gave today.   Flu vaccine--->declines today. Labs: fasting HP + HbA1c (prediabetes). Prostate ca screening: average risk patient= as per latest guidelines, start screening at 45-50 yrs of age. Colon ca screening: average risk patient= as per latest guidelines, start screening at 45-50 yrs of age.  4) Sarcoidosis: he requires multiple prednisone tapers for flare-ups throughout the year. Checking fasting gluc and Hba1c to keep an eye on prediabetes. He is working on cutting back on carbs some. Exercise is minimal most of the time-->sometimes he feels good enough to walk for exercise. He'll continue with [facility] and dermatology for f/u of his sarcoidosis.  An After Visit Summary was printed and given to the patient.  FOLLOW UP:  Return in about 6 months (around 10/19/2018) for routine chronic illness f/u.  Signed:  Crissie Sickles, MD           04/20/2018

## 2018-04-21 LAB — HIV ANTIBODY (ROUTINE TESTING W REFLEX): HIV 1&2 Ab, 4th Generation: NONREACTIVE

## 2018-04-29 ENCOUNTER — Encounter: Payer: Self-pay | Admitting: Family Medicine

## 2018-05-10 ENCOUNTER — Ambulatory Visit: Payer: 59 | Admitting: Internal Medicine

## 2018-05-20 ENCOUNTER — Institutional Professional Consult (permissible substitution): Payer: 59 | Admitting: Pulmonary Disease

## 2018-05-25 ENCOUNTER — Encounter: Payer: Self-pay | Admitting: Family Medicine

## 2018-05-26 ENCOUNTER — Other Ambulatory Visit: Payer: Self-pay | Admitting: Internal Medicine

## 2018-05-26 MED ORDER — HYDROXYCHLOROQUINE SULFATE 200 MG PO TABS: 200.0000 mg | ORAL_TABLET | Freq: Every day | ORAL | 0 refills | Status: DC

## 2018-06-21 ENCOUNTER — Other Ambulatory Visit: Payer: Self-pay | Admitting: Internal Medicine

## 2018-06-22 ENCOUNTER — Encounter: Payer: Self-pay | Admitting: Family Medicine

## 2018-06-23 ENCOUNTER — Institutional Professional Consult (permissible substitution): Payer: 59 | Admitting: Pulmonary Disease

## 2018-06-24 ENCOUNTER — Other Ambulatory Visit: Payer: Self-pay | Admitting: Family Medicine

## 2018-06-25 ENCOUNTER — Encounter: Payer: Self-pay | Admitting: Family Medicine

## 2018-06-26 ENCOUNTER — Other Ambulatory Visit: Payer: Self-pay | Admitting: Internal Medicine

## 2018-07-01 ENCOUNTER — Institutional Professional Consult (permissible substitution): Payer: 59 | Admitting: Pulmonary Disease

## 2018-08-20 ENCOUNTER — Other Ambulatory Visit: Payer: Self-pay | Admitting: Family Medicine

## 2018-09-03 ENCOUNTER — Telehealth: Payer: Self-pay | Admitting: Internal Medicine

## 2018-09-03 NOTE — Telephone Encounter (Signed)
Called and spoke with pt who states at times he still has problems with SOB and has been using prednisone to help with this. Stated to pt that we would need to schedule visit with MW for him and stated that the visit could be a televisit. Pt expressed understanding. televisit has been scheduled for pt Monday, 3/30 with MW at 8:45. Nothing further needed.

## 2018-09-06 ENCOUNTER — Ambulatory Visit (INDEPENDENT_AMBULATORY_CARE_PROVIDER_SITE_OTHER): Payer: 59 | Admitting: Internal Medicine

## 2018-09-06 ENCOUNTER — Other Ambulatory Visit: Payer: Self-pay

## 2018-09-06 ENCOUNTER — Encounter: Payer: Self-pay | Admitting: Internal Medicine

## 2018-09-06 DIAGNOSIS — D869 Sarcoidosis, unspecified: Secondary | ICD-10-CM

## 2018-09-06 DIAGNOSIS — R05 Cough: Secondary | ICD-10-CM

## 2018-09-06 DIAGNOSIS — R059 Cough, unspecified: Secondary | ICD-10-CM

## 2018-09-06 MED ORDER — PREDNISONE 5 MG (21) PO TBPK: ORAL_TABLET | ORAL | Status: DC

## 2018-09-06 MED ORDER — HYDROXYCHLOROQUINE SULFATE 200 MG PO TABS: ORAL_TABLET | ORAL | 0 refills | Status: DC

## 2018-09-06 MED ORDER — PANTOPRAZOLE SODIUM 40 MG PO TBEC: DELAYED_RELEASE_TABLET | ORAL | 1 refills | Status: DC

## 2018-09-06 NOTE — Assessment & Plan Note (Addendum)
Dates back to 2011 - extensive w/u at [facility] seen 09/26/14 - FENO 12/11/2016  =    18  - rx with max gerd /1st gen h1 12/11/2016 > ? If ever took it ?  - resolved as of 03/10/2017  maint on ppi  / dymista    Adequate control on present rx, reviewed in detail with pt > no change in rx needed     Each maintenance medication was reviewed in detail including most importantly the difference between maintenance and as needed and under what circumstances the prns are to be used.  Please see AVS for specific  Instructions which are unique to this visit and I personally typed out and mailed to pt.

## 2018-09-06 NOTE — Patient Instructions (Signed)
Since your cough is better on dysmista and protonix and does not flare when you have perceive the sarcoid is getting worse it is unlikely related to your sarcoidosis so continue the dysmista/protonix   Ok to use protonix Take 30-60 min before first meal of the day but at onset of any cough you need to remember to Take 30- 60 min before your first and last meals of the day until cough is gone again  I refilled your protonix today for the next 6 months but after that you will need to return here or see your PCP for this (as it's not really a pulmonary issue)   Take prednisone and plaquenil at your rheumatologists direction - the goal for plaquenil is to eventually wean you off of all prednisone.    Once the carona virus restrictions are listed we need to see you back here for PFT's/ ov so let's set this up for 6 months from now

## 2018-09-06 NOTE — Assessment & Plan Note (Signed)
Suspected onset around 2011/ proven with skin by 01/09/17  See Cxr 12/11/2016 > rx augmentin x 10 days and pred x 6 and f/u with cxr / ov in 2 weeks > canceled appt  And cxr  No change 12/29/16 so rec CT - Angiotensin  12/18/16   138 with esr 44  - CT 01/05/2017   Extensive partially calcified mediastinal and bilateral hilar lymphadenopathy with extensive perilymphatic nodularity throughout the lungs bilaterally (right greater than left). This spectrum of imaging findings is compatible with the clinically suspected Sarcoidosis. 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/2017 start plaquenil 200 mg daily > pt d/c'd  But restarted at rheumatology recs winter 2020    The goal with a chronic steroid dependent illness is always arriving at the lowest effective dose that controls the disease/symptoms and not accepting a set "formula" which is based on statistics or guidelines that don't always take into account patient  variability or the natural hx of the dz in every individual patient, which may well vary over time.  For now therefore I recommend the patient maintain on the lowest possible dose per rheum's direction. Explained to pt the plaquenil should serve as a steroid sparing agent but there is a risk at this point that some of symptoms of sarcoid may be due to steroid w/d rather than sarcoid activity but the rule should be the same= minimize exogenous steroid exposure.

## 2018-09-06 NOTE — Progress Notes (Signed)
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  Quit smoking   1997 s sequelae 2006  did fine until 2011 really bad cold > chronic cough eval by Dr Joya Gaskins 04/2010 with suspicion for sarcoid (pos in father) but CT with only nonspecific adenopathy  waxes and wanes since then with extensive w/u / rx at [facility] voice center (reviewed in care everywhere)  And much  worse x 09/2016 assoc with hb/ worse with certain foods and some better dymista self referred to pulmonary clinic 12/11/2016 with  Skin bx 01/09/17 :   Pos granulomatous dermatitis c/w sarcoidosis    History of Present Illness  12/11/2016 1st [name] office visit/ Herny Scurlock   Chief Complaint  [name], and has "passed out" once before due to cough.   cough x 6 years  Citric drinks make it worse  Allergy eval [facility] allergy  nexium as walking the door     Kouffman Reflux v Neurogenic Cough Differentiator Reflux Comments  Do you awaken from a sound sleep coughing violently?                            With trouble breathing? No   Do you have choking episodes when you cannot  Get enough air, gasping for air ?              Yes   Do you usually cough when you lie down into  The bed, or when you just lie down to rest ?                          Sometimes depending on what eaten   Do you usually cough after meals or eating?         Yes   Do you cough when (or after) you bend over?    Yes if full   GERD SCORE     Kouffman Reflux v Neurogenic Cough Differentiator Neurogenic   Do you more-or-less cough all day long? Sporadically    Does change of temperature make you cough? Not much   Does laughing or chuckling  cause you to cough? yes   Do fumes (perfume, automobile fumes, burned  Toast, etc.,) cause you to cough ?      Can, esp cooking on grill   Does speaking, singing, or talking on the phone cause you to cough   ?               No    Neurogenic/Airway score      rec Protonix (pantoprazole) 40 mg  Take 30- 60 min before your first and last meals of the day  Continue dymista one twice daily - point toward ear on same side For drainage / throat tickle as need  >>>   take CHLORPHENIRAMINE  4 mg - take one every 4 hours as needed -  For cough >>>   tessilon 200 mg three to four times daily  GERD (REFLUX)   Please remember to go to the x-ray department downstairs in  the basement  for your tests - we will call you with the results when they are available. If not better at 6 weeks you need to return to [facility] - if better then ok to refill meds thru your PCP or return here at 3 months to regroup re need for longterm treatment options - late add:  ? pna in rll/ ? Sarcoid changes also rec:  Return for esr/ cmet/ angiotensin, cbc with diff and rx Augmentin 875 mg take one pill twice daily  X 10 daysAnd  Prednisone 10 mg take  4 each am x 2 days,   2 each am x 2 days,  1 each am x 2 days and stop  And f/u with cxr in 2 weeks =  No change 12/29/16  - Angiotensin  12/18/16   138 with esr 44  - CT 01/05/2017   Extensive partially calcified mediastinal and bilateral hilar lymphadenopathy with extensive perilymphatic nodularity throughout the lungs bilaterally (right greater than left). This spectrum of imaging findings is compatible with the clinically suspected sarcoidosis.       01/06/2017  f/u ov/Bassel Gaskill re: probable sarcoid / ?uacs on gerd rx  Chief Complaint  Patient presents with  . Follow-up    Pt hwew to discuss CT scan, He still has occ. sob,he is still coughing which causes him some dizziness,   rash has evolved over several years worse area R neck attributed to reaction to otc cream but noted  over other parts of face where no cream was used  Cough is better but still requires rewwq tessilon All symptoms a lot better (x for the rash) while on short courses of prednisone New problem is painful swelling R elbow s trauma hx  rec Continue protonix and dymista as you are If cough or breathing worse, take prednisone 10 mg x 2 until better then 1 daily x one week then stop  We will call you with appt to see dermatology and orthopedics    - Skin bx 01/09/17 :   Pos granulomatous dermatitis > rec 01/21/2017 start plaquenil 200 mg daily    03/10/2017  f/u ov/Jarissa Sheriff re:  Plaquenil x 01/21/17 @ 200 mg daily  Chief Complaint  Patient presents with  . Follow-up    Pt states he occ has a "strange sensation" in the right side of his chest. He states he feels tired most of the time, not much energy.    cough x onset 2011 then worse 09/2016 > resolved p prednisone and stayed gone p starting plaquenil as above Skin changes x 2016 also improving on plaquenil and steroid cream per derm  No need for prednisone since starting plaquenil  rec No change plaquenil dose for now  Please schedule a follow up office visit in 6 weeks, call sooner if needed with cxr on return  - consider taper off gerd rx if cough stays gone at next ov      02/25/2018  Pulmonary consultation: [name] re: sarcoidosis on "prn prednisone/ did not maintain on plaquenil / needs clearance for R olecranon bursitis Chief Complaint  Patient presents with  . Advice Only    Needing pulm clearance for right elbow surgery. He has occ cough- non prod.    Not limited by breathing from desired activities   Min daytime dry cough attributes to pnds / some better on dymista when can afford it, does not recall resp to 1st gen H1 blockers per guidelines   No ocular complaints  Sleeps  flat ok  rec Please remember to go to the  x-ray department downstairs in the basement  for your tests - we will call you with the results when they are available and  clear you for surgery  If rash worse > return to dermatology > referred to rheum and rec 200 mg x 2 plaquenil daily  If nasal symptoms worse > ent  See your eye doctor yearly  Follow up here is as needed for flare of sarcoid symptoms as you've had in the past if they continue to require prednisone to control            Virtual Visit via Telephone Note  I connected with Torry Creighton on 09/06/18 at  8:45 AM EDT by telephone and verified that I am speaking with the correct person using two identifiers.   I discussed the limitations, risks, security and privacy concerns of performing an evaluation and management service by telephone and the availability of in person appointments. I also discussed with the patient that there may be a patient responsible charge related to this service. The patient expressed understanding and agreed to proceed.   History of Present Illness: 09/06/2018  f/u ov/Haru Anspaugh re: sarcoidosis / skin involvement/rheumatology  rec plaqueninl 200 x 2 rec prednisone 5 mg  Per Ursula Alert x 20 mg x 4 days   Dyspnea:  Not limited by breathing from desired activities  / work out fine Cough: none on dymista /pantoprazole Sleeping: sleep flat  SABA use: none 02: none    No obvious day to day or daytime variability or assoc excess/ purulent sputum or mucus plugs or hemoptysis or cp or chest tightness, subjective wheeze or overt sinus or hb symptoms.   Sleeping fine without nocturnal  or early am exacerbation  of respiratory  c/o's or need for noct saba. Also denies any obvious fluctuation of symptoms with weather or environmental changes or other aggravating or alleviating factors except as outlined above   No unusual exposure hx or h/o childhood pna/ asthma or knowledge of premature birth.  Current Allergies, Complete Past Medical History, Past Surgical History, Family History, and Social History were reviewed in [employer] record.  ROS  The following  are not active complaints unless bolded Hoarseness, sore throat, dysphagia, dental problems, itching, sneezing,  nasal congestion or discharge of excess mucus or purulent secretions, ear ache,   fever, chills, sweats, unintended wt loss or wt gain, classically pleuritic or exertional cp,  orthopnea pnd or arm/hand swelling  or leg swelling, presyncope, palpitations, abdominal pain, anorexia, nausea, vomiting, diarrhea  or change in bowel habits or change in bladder habits, change in stools or change in urine, dysuria, hematuria,  rash, arthralgias, visual complaints, headache, numbness, weakness or ataxia or problems with walking or coordination,  change in mood or  memory.     Observations/Objective: Sleeping in full sentences/ no coughing     Follow Up Instructions:    I discussed the assessment and treatment plan with the patient. The patient was provided an opportunity to ask questions and all were answered. The patient agreed with the plan and demonstrated an understanding of the instructions.   The patient was advised to call back or seek an in-person evaluation if the symptoms worsen or if the condition fails to improve as anticipated.  I provided 25  minutes of non-face-to-face time during this encounter.   Christinia Gully, MD

## 2018-10-22 ENCOUNTER — Telehealth: Payer: Self-pay

## 2018-10-22 ENCOUNTER — Other Ambulatory Visit: Payer: Self-pay

## 2018-10-22 ENCOUNTER — Ambulatory Visit (INDEPENDENT_AMBULATORY_CARE_PROVIDER_SITE_OTHER): Payer: 59 | Admitting: Family Medicine

## 2018-10-22 ENCOUNTER — Encounter: Payer: Self-pay | Admitting: Family Medicine

## 2018-10-22 VITALS — BP 165/103 | HR 77 | Ht 72.0 in

## 2018-10-22 DIAGNOSIS — R7303 Prediabetes: Secondary | ICD-10-CM | POA: Diagnosis not present

## 2018-10-22 DIAGNOSIS — I10 Essential (primary) hypertension: Secondary | ICD-10-CM

## 2018-10-22 MED ORDER — HYDROCHLOROTHIAZIDE 25 MG PO TABS: 25.0000 mg | ORAL_TABLET | Freq: Every day | ORAL | 1 refills | Status: DC

## 2018-10-22 MED ORDER — IRBESARTAN 300 MG PO TABS: 300.0000 mg | ORAL_TABLET | Freq: Every day | ORAL | 1 refills | Status: DC

## 2018-10-22 MED ORDER — AMLODIPINE BESYLATE 10 MG PO TABS: 10.0000 mg | ORAL_TABLET | Freq: Every day | ORAL | 1 refills | Status: DC

## 2018-10-22 NOTE — Progress Notes (Signed)
Virtual Visit via Video Note  I connected with pt on 10/22/18 at  8:40 AM EDT by a video enabled telemedicine application and verified that I am speaking with the correct person using two identifiers.  Location patient: home Location provider:work or home office Persons participating in the virtual visit: patient, provider  I discussed the limitations of evaluation and management by telemedicine and the availability of in person appointments. The patient expressed understanding and agreed to proceed.  Telemedicine visit is a necessity given the COVID-19 restrictions in place at the current time.  HPI: 43 y/o AAM being seen today for 6 mo f/u HTN. He has prediabetes and we've been following his A1c, esp since he has requirement for prednisone prn due to periodic flares of his sarcoidosis.  HTN: he does not check his bp outside of his office. Today's measurement was on a wrist cuff that he says is "probably not very good". He reports feeling well lately. Says trouble getting amlodipine and irbesartan at pharmacy so he hasn't been on either of these in unclear amount of time.  ROS: See pertinent positives and negatives per HPI.  Past Medical History:  Diagnosis Date  . Allergic rhinitis   . 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  Hemoglobin electrophoresis normal 2016.  Marland Kitchen Chronic renal insufficiency, stage II (mild)   . Cutaneous sarcoidosis    [facility]  . Hoarseness 2015   [facility] 04/2014-normal laryngoscopy: prednisone taper and bid nexium recommended.  No signif help so pt got 2nd opinion at [facility]   . HTN (hypertension)   . Hydrocele    left  . LPRD (laryngopharyngeal reflux disease)   . Obesity, Class II, BMI 35-39.9   . Olecranon bursitis of right elbow 04/29/2018   persistent/recurrent s/p aspiration-->bursa excision   . Peripheral edema    left leg>right leg  . Prediabetes 02/2017   A1c 6.3% (right when he was starting to take prednisone prn for treatment of his  sarcoidosis).  HbA1c 08/2017=6.3%.  . 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 Melvyn Novas suspects it was smoldering since 2011.  Skin bx of cutaneous lesion confirmed dx of sarcoidosis 02/2017.  Pt improving fall 2018 with plaquenil and prednisone.  Pt self d/c'd plaquenil, takes pred prn per his own judgement, pt commonly takes meds incorrectly/not as recommended by MDs.  . Vocal cord polyps 2015   surgery [facility]     Past Surgical History:  Procedure Laterality Date  . OLECRANON BURSECTOMY Right 2019  . 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  . SKIN GRAFT  2010   on left index finger  . Vocal cord surgery  2016   [facility]  . WISDOM TOOTH EXTRACTION  2005    Family History  Problem Relation Age of Onset  . Sarcoidosis Father        ?  Marland Kitchen Lactose intolerance Father   . Other Mother        colon issues-portion of colon removed  . Lactose intolerance Mother   . Cancer Paternal Grandfather        ?     Current Outpatient Medications:  .  amLODipine (NORVASC) 10 MG tablet, TAKE 1 TABLET EVERY DAY, Disp: 90 tablet, Rfl: 3 .  DYMISTA 137-50 MCG/ACT SUSP, INSTILL 1 SPRAY IN EACH NOSTRIL TWICE DAILY, Disp: 1 Bottle, Rfl: 5 .  hydrochlorothiazide (HYDRODIURIL) 25 MG tablet, TAKE 1 TABLET BY MOUTH EVERY DAY, Disp:  90 tablet, Rfl: 1 .  hydrocortisone butyrate (LUCOID) 0.1 % CREA cream, Apply 1 application topically 2 (two) times daily., Disp: 60 g, Rfl: 6 .  hydroxychloroquine (PLAQUENIL) 200 MG tablet, Take 2 daily, Disp: , Rfl: 0 .  pantoprazole (PROTONIX) 40 MG tablet, Take 30- 60 min before your first and last meals of the day, Disp: 180 tablet, Rfl: 1 .  irbesartan (AVAPRO) 300 MG tablet, TAKE 1 TABLET (300 MG TOTAL) BY MOUTH DAILY. (Patient not taking: Reported on 04/20/2018), Disp: 90 tablet, Rfl: 1 .  metoprolol succinate (TOPROL-XL) 25 MG 24 hr tablet, TAKE 1 TABLET BY MOUTH EVERY DAY (Patient not taking: Reported on 10/22/2018), Disp: 90 tablet, Rfl:  1 .  predniSONE (STERAPRED UNI-PAK 21 TAB) 5 MG (21) TBPK tablet, Per rheumatology recs (Patient not taking: Reported on 10/22/2018), Disp: , Rfl:  .  traZODone (DESYREL) 50 MG tablet, 1-2 tabs po qhs prn insomnia (Patient not taking: Reported on 10/22/2018), Disp: 30 tablet, Rfl: 1  EXAM:  VITALS per patient if applicable: BP (!) 403/474 (BP Location: Left Arm, Patient Position: Sitting, Cuff Size: Large)   Pulse 77   Ht 6' (1.829 m)   BMI 38.55 kg/m    GENERAL: alert, oriented, appears well and in no acute distress  HEENT: atraumatic, conjunttiva clear, no obvious abnormalities on inspection of external nose and ears  NECK: normal movements of the head and neck  LUNGS: on inspection no signs of respiratory distress, breathing rate appears normal, no obvious gross SOB, gasping or wheezing  CV: no obvious cyanosis  MS: moves all visible extremities without noticeable abnormality  PSYCH/NEURO: pleasant and cooperative, no obvious depression or anxiety, speech and thought processing grossly intact  LABS: none today    Chemistry      Component Value Date/Time   NA 137 04/20/2018 1040   K 3.9 04/20/2018 1040   CL 99 04/20/2018 1040   CO2 29 04/20/2018 1040   BUN 22 04/20/2018 1040   CREATININE 1.32 04/20/2018 1040   CREATININE 1.43 (H) 02/28/2016 0720      Component Value Date/Time   CALCIUM 9.6 04/20/2018 1040   ALKPHOS 51 04/20/2018 1040   AST 20 04/20/2018 1040   ALT 18 04/20/2018 1040   BILITOT 0.6 04/20/2018 1040     Lab Results  Component Value Date   HGBA1C 6.2 04/20/2018   Lab Results  Component Value Date   TSH 2.75 04/20/2018   Lab Results  Component Value Date   WBC 7.7 04/20/2018   HGB 14.5 04/20/2018   HCT 46.0 04/20/2018   MCV 67.3 Repeated and verified X2. (L) 04/20/2018   PLT 263.0 04/20/2018   Lab Results  Component Value Date   CHOL 191 04/20/2018   HDL 40.70 04/20/2018   LDLCALC 131 (H) 04/20/2018   TRIG 96.0 04/20/2018   CHOLHDL 5  04/20/2018    ASSESSMENT AND PLAN:  Discussed the following assessment and plan:  1) HTN: poor control, home monitoring not being done.  The one bp measurement we have is today's and it cannot be trusted, unfortunately.  He was encouraged again to buy bp cuff for home monitoring. In the meantime he needs to get back on all his amlodipine and irbesartan (we made sure the pharmacy had no problems filling these today). He is to get a bp check here when he comes in for his lab work-->ordered BMET and A1c future.  2) prediabetes.  Has to be on systemic steroids some due to sarcoidosis  flares. HbA1c future.  He has appt with pulm in June this year to discuss OSA per his report today.  I discussed the assessment and treatment plan with the patient. The patient was provided an opportunity to ask questions and all were answered. The patient agreed with the plan and demonstrated an understanding of the instructions.   The patient was advised to call back or seek an in-person evaluation if the symptoms worsen or if the condition fails to improve as anticipated.  F/u: 6 mo CPE  Signed:  Crissie Sickles, MD           10/22/2018

## 2018-10-22 NOTE — Telephone Encounter (Signed)
LMCTB to schedule labs and BP check for nurse visit.  Copied from [place] [id number]. Topic: Appointment Scheduling - Scheduling Inquiry for Clinic >> Oct 22, 2018  9:09 AM Sharp, Sean Lund wrote: Reason for CRM: LM for patient to CB to schedule labs(not fasting) & nurse visit at the same time for bp chec

## 2018-11-17 ENCOUNTER — Telehealth: Payer: Self-pay | Admitting: Family Medicine

## 2018-11-17 NOTE — Telephone Encounter (Signed)
Copied from [place] [id number]. Topic: General - Other >> Nov 17, 2018 10:01 AM [name] wrote: Reason for CRM: Pt called with information where he would like to get his labs done. Pt is requesting to get a call back when the appt is scheduled so he can fast. Please advise.  [name]  [address] [place], [place] [address] Phone [phone]  Fax# [phone]

## 2018-11-17 NOTE — Telephone Encounter (Signed)
FYI::: Patient called to ask if he could get labs drawn in [place]. 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   Patient will be calling back with a [facility] or [facility] lab's location and contact info so that we can fax info to whatever location is convenient to where his job site is.   No call is needed. Waiting on callback from patient.

## 2018-11-17 NOTE — Telephone Encounter (Signed)
Okay for lab draw?

## 2018-11-23 ENCOUNTER — Other Ambulatory Visit: Payer: Self-pay | Admitting: Family Medicine

## 2018-11-23 DIAGNOSIS — I10 Essential (primary) hypertension: Secondary | ICD-10-CM

## 2018-11-23 DIAGNOSIS — Z79899 Other long term (current) drug therapy: Secondary | ICD-10-CM

## 2018-11-23 NOTE — Telephone Encounter (Signed)
Copied from [place] [id number]. Topic: General - Other >> Nov 17, 2018 10:01 AM [name] wrote: Reason for CRM: Pt called with information where he would like to get his labs done. Pt is requesting to get a call back when the appt is scheduled so he can fast. Please advise.  [facility]  [address] [place], [place] [address] Phone [phone]  Fax# [phone]   Okay for lab draw?

## 2018-11-23 NOTE — Telephone Encounter (Signed)
Yes, ok for labs now. I entered a cbc today for [facility], but the A1c and BMET that I ordered a month ago (future) will need to be changed to [facility]thx

## 2018-11-23 NOTE — Addendum Note (Signed)
Addended by: Katina Dung on: 11/23/2018 03:52 PM   Modules accepted: Orders

## 2018-11-23 NOTE — Telephone Encounter (Signed)
I have changed the orders from a month ago to [place].  I have LM for patient to see how he wants to get the orders. We can either print them and he can take them to [place] or we can fax the orders - but he will need to call and set up his own appointment.

## 2018-12-08 ENCOUNTER — Other Ambulatory Visit: Payer: Self-pay

## 2018-12-08 ENCOUNTER — Encounter: Payer: Self-pay | Admitting: Internal Medicine

## 2018-12-08 ENCOUNTER — Ambulatory Visit (INDEPENDENT_AMBULATORY_CARE_PROVIDER_SITE_OTHER): Payer: 59 | Admitting: Internal Medicine

## 2018-12-08 DIAGNOSIS — J309 Allergic rhinitis, unspecified: Secondary | ICD-10-CM

## 2018-12-08 DIAGNOSIS — D869 Sarcoidosis, unspecified: Secondary | ICD-10-CM | POA: Diagnosis not present

## 2018-12-08 NOTE — Progress Notes (Signed)
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   1997 s sequelae 2006  With exp to foam bad cough resolved w/in 3 days of avoidance and did fine until 2011 really bad cold > chronic cough eval by Dr Joya Gaskins 04/2010 with suspicion for sarcoid (pos in father) but CT with only nonspecific adenopathy  waxes and wanes since then with extensive w/u / rx at [facility] voice center (reviewed in care everywhere)  And much  worse x 09/2016 assoc with hb/ worse with certain foods and some better dymista self referred to pulmonary clinic 12/11/2016    History of Present Illness  12/11/2016 1st [facility] office visit/    Chief Complaint  [name], and has "passed out" once before due to cough.    cough x 6 years  Citric drinks make it worse  Allergy eval Aug[place] cedar allergy  nexium as walking the door     Kouffman Reflux v Neurogenic Cough Differentiator Reflux Comments  Do you awaken from a sound sleep coughing violently?                            With trouble breathing? No   Do you have choking episodes when you cannot  Get enough air, gasping for air ?              Yes   Do you usually cough when you lie down into  The bed, or when you just lie down to rest ?                          Sometimes depending on what eaten   Do you usually cough after meals or eating?         Yes   Do you cough when (or after) you bend over?    Yes if full   GERD SCORE     Kouffman Reflux v Neurogenic Cough Differentiator Neurogenic   Do you more-or-less cough all day long? Sporadically    Does change of temperature make you cough? Not much   Does laughing or chuckling  cause you to cough? yes   Do fumes (perfume, automobile fumes, burned  Toast, etc.,) cause you to cough ?      Can, esp cooking on grill   Does speaking, singing, or talking on the phone cause you to cough   ?               No    Neurogenic/Airway score      rec Protonix (pantoprazole) 40 mg  Take 30- 60 min before your first and last meals of the day  Continue dymista one twice daily - point toward ear on same side For drainage / throat tickle as need  >>>   take CHLORPHENIRAMINE  4 mg - take one every 4 hours as needed -  For cough >>>   tessilon 200 mg three to four times daily  GERD (REFLUX)   Please remember to go to the x-ray department downstairs  in the basement  for your tests - we will call you with the results when they are available. If not better at 6 weeks you need to return to [facility] - if better then ok to refill meds thru your PCP or return here at 3 months to regroup re need for longterm treatment options - late add:  ? pna in rll/ ? Sarcoid changes also rec:  Return for esr/ cmet/ angiotensin, cbc with diff and rx Augmentin 875 mg take one pill twice daily  X 10 daysAnd  Prednisone 10 mg take  4 each am x 2 days,   2 each am x 2 days,  1 each am x 2 days and stop  And f/u with cxr in 2 weeks =  No change 12/29/16  - Angiotensin  12/18/16   138 with esr 44  - CT 01/05/2017   Extensive partially calcified mediastinal and bilateral hilar lymphadenopathy with extensive perilymphatic nodularity throughout the lungs bilaterally (right greater than left). This spectrum of imaging findings is compatible with the clinically suspected sarcoidosis.       01/06/2017  f/u ov/ re: probable sarcoid / ?uacs on gerd rx  Chief Complaint  Patient presents with  . Follow-up    Pt hwew to discuss CT scan, He still has occ. sob,he is still coughing which causes him some dizziness,   rash has evolved over several years worse area R neck attributed to reaction to otc cream but noted  over other parts of face where no cream was used  Cough is better but still requires rewwq tessilon All symptoms a lot better (x for the rash) while on short courses of prednisone New problem is painful swelling R elbow s trauma hx  rec Continue protonix and dymista as you are If cough or breathing worse, take prednisone 10 mg x 2 until better then 1 daily x one week then stop  We will call you with appt to see dermatology and orthopedics    - Skin bx 01/09/17 :   Pos granulomatous dermatitis > rec 01/21/2017 start plaquenil 200 mg daily    03/10/2017  f/u ov/ re:  Plaquenil x 01/21/17 @ 200 mg daily  Chief Complaint  Patient presents with  . Follow-up    Pt states he occ has a "strange sensation" in the right side of his chest. He states he feels tired most of the time, not much energy.    cough x onset 2011 then worse 09/2016 > resolved p prednisone and stayed gone p starting plaquenil as above Skin changes x 2016 also improving on plaquenil and steroid cream per derm  No need for prednisone since starting plaquenil  rec No change plaquenil dose for now  Please schedule a follow up office visit in 6 weeks, call sooner if needed with cxr on return  - consider taper off gerd rx if cough stays gone at next ov      02/25/2018  Pulmonary consultation:  re: sarcoidosis on "prn prednisone/ did not maintain on plaquenil / needs clearance for R olecranon bursitis Chief Complaint  Patient presents with  . Advice Only    Needing pulm clearance for right elbow surgery. He has occ cough- non prod.     Not limited by breathing from desired activities   Min daytime dry cough attributes to pnds / some better on dymista when can afford it, does not recall resp to 1st gen H1 blockers per guidelines   No ocular complaints  Sleeps flat ok  rec If rash worse > return to dermatology  If nasal symptoms worse > ent  See your eye doctor yearly    televist 09/06/18 History of Present  Illness: 09/06/2018  f/u ov/ re: sarcoidosis / skin involvement/rheumatology  rec plaqueninl 200 x 2 rec prednisone 5 mg  Per Ursula Alert x 20 mg x 4 days   Dyspnea:  Not limited by breathing from desired activities  / work out fine Cough: none on dymista /pantoprazole Sleeping: sleep flat  SABA use: none 02: none  rec Since your cough is better on dysmista and protonix and does not flare when you have perceive the sarcoid is getting worse it is unlikely related to your sarcoidosis so continue the dysmista/protonix  Ok to use protonix Take 30-60 min before first meal of the day but at onset of any cough you need to remember to Take 30- 60 min before your first and last meals of the day until cough is gone again I refilled your protonix today for the next 6 months but after that you will need to return here or see your PCP for this (as it's not really a pulmonary issue)  Take prednisone and plaquenil at your rheumatologists direction - the goal for plaquenil is to eventually wean you off of all prednisone.  Once the carona virus restrictions are listed we need to see you back here for PFT's/ ov so let's set this up for 6 months from now     12/08/2018  f/u ov/ re:  Sarcoidosis on plaquenil 200 x 2, no pred x months f/u by rheum Chief Complaint  Patient presents with  . Follow-up    No co's today   Dyspnea:  Good ex tol Cough: betterp dymista  Sleeping: ok SABA use: none  02: none    No obvious day to day or daytime variability or assoc excess/ purulent sputum or mucus plugs or hemoptysis or cp or chest tightness, subjective wheeze or overt sinus or hb symptoms.   Sleeping  without nocturnal  or early am exacerbation  of respiratory  c/o's or need for noct saba. Also denies any obvious fluctuation of symptoms with weather or environmental changes or other aggravating or alleviating factors except as outlined above   No unusual exposure hx or h/o childhood pna/ asthma or knowledge of  premature birth.  Current Allergies, Complete Past Medical History, Past Surgical History, Family History, and Social History were reviewed in [employer] record.  ROS  The following are not active complaints unless bolded Hoarseness, sore throat, dysphagia, dental problems, itching, sneezing,  nasal congestion or discharge of excess mucus or purulent secretions, ear ache,   fever, chills, sweats, unintended wt loss or wt gain, classically pleuritic or exertional cp,  orthopnea pnd or arm/hand swelling  or leg swelling, presyncope, palpitations, abdominal pain, anorexia, nausea, vomiting, diarrhea  or change in bowel habits or change in bladder habits, change in stools or change in urine, dysuria, hematuria,  rash, arthralgias, visual complaints, headache, numbness, weakness or ataxia or problems with walking or coordination,  change in mood or  memory.        Current Meds  Medication Sig  . amLODipine (NORVASC) 10 MG tablet Take 1 tablet (10 mg total) by mouth daily.  Marland Kitchen DYMISTA 137-50 MCG/ACT SUSP INSTILL 1 SPRAY IN EACH NOSTRIL TWICE DAILY  . hydrochlorothiazide (HYDRODIURIL) 25 MG tablet Take 1 tablet (25 mg total) by mouth daily.  . hydrocortisone butyrate (  LUCOID) 0.1 % CREA cream Apply 1 application topically 2 (two) times daily.  . hydroxychloroquine (PLAQUENIL) 200 MG tablet Take 2 daily  . irbesartan (AVAPRO) 300 MG tablet Take 1 tablet (300 mg total) by mouth daily.  . metoprolol succinate (TOPROL-XL) 25 MG 24 hr tablet TAKE 1 TABLET BY MOUTH EVERY DAY  . pantoprazole (PROTONIX) 40 MG tablet Take 30- 60 min before your first and last meals of the day  . traZODone (DESYREL) 50 MG tablet 1-2 tabs po qhs prn insomnia                      Objective:   Physical Exam     12/08/2018         290  02/25/2018       281  03/10/2017       286  01/06/2017       281   12/11/16 278 lb (126.1 kg)  08/29/16 278 lb (126.1 kg)  02/29/16 279 lb (126.6 kg)     amb  mod obese bm nad   Vital signs reviewed - Note on arrival 02 sats  95% on RA    HEENT: nl dentition, turbinates bilaterally, and oropharynx. Nl external ear canals without cough reflex   NECK :  without JVD/Nodes/TM/ nl carotid upstrokes bilaterally   LUNGS: no acc muscle use,  Nl contour chest which is clear to A and P bilaterally without cough on insp or exp maneuvers   CV:  RRR  no s3 or murmur or increase in P2, and no edema   ABD:  soft and nontender with nl inspiratory excursion in the supine position. No bruits or organomegaly appreciated, bowel sounds nl  MS:  Nl gait/ ext warm without deformities, calf tenderness, cyanosis or clubbing No obvious joint restrictions   SKIN: warm and dry without lesions    NEURO:  alert, approp, nl sensorium with  no motor or cerebellar deficits apparent.                 Assessment:

## 2018-12-08 NOTE — Patient Instructions (Signed)
Ok to take dymista up to 2 puffs every 12 hours as needed for drippy nose - ok to try just one   Over the counters zyrtec take at bedtime if tends to make you drowsy will also help your nasal symptoms    Follow up is as needed for refills

## 2018-12-13 ENCOUNTER — Encounter: Payer: Self-pay | Admitting: Internal Medicine

## 2018-12-13 NOTE — Assessment & Plan Note (Signed)
Suspected onset around 2011/ proven with skin by 01/09/17  See Cxr 12/11/2016 > rx augmentin x 10 days and pred x 6 and f/u with cxr / ov in 2 weeks > canceled appt  And cxr  No change 12/29/16 so rec CT - Angiotensin  12/18/16   138 with esr 44  - CT 01/05/2017   Extensive partially calcified mediastinal and bilateral hilar lymphadenopathy with extensive perilymphatic nodularity throughout the lungs bilaterally (right greater than left). This spectrum of imaging findings is compatible with the clinically suspected Sarcoidosis. 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/2017 start plaquenil 200 mg daily > pt d/c'd  But restarted at rheumatology recs winter 2020 > improved 12/08/2018 and off pred x months  Doing much better, says finally off prednisone, advised on impt of f/u with rheum going forward for prednisone rx and here prn

## 2018-12-13 NOTE — Assessment & Plan Note (Addendum)
?  Worse now that off prednisone >>  Rec zyrtec prn    Each maintenance medication was reviewed in detail including most importantly the difference between maintenance and as needed and under what circumstances the prns are to be used.  Please see AVS for specific  Instructions which are unique to this visit and I personally typed out  which were reviewed in detail in writing with the patient and a copy provided.

## 2019-01-04 ENCOUNTER — Other Ambulatory Visit: Payer: Self-pay | Admitting: *Deleted

## 2019-01-04 DIAGNOSIS — Z20822 Contact with and (suspected) exposure to covid-19: Secondary | ICD-10-CM

## 2019-01-05 ENCOUNTER — Other Ambulatory Visit: Payer: Self-pay

## 2019-02-08 ENCOUNTER — Other Ambulatory Visit: Payer: Self-pay | Admitting: Family Medicine

## 2019-03-01 ENCOUNTER — Telehealth: Payer: Self-pay | Admitting: Internal Medicine

## 2019-03-01 NOTE — Telephone Encounter (Signed)
Received call from Marella Chimes PA-C from [facility]. Junie Panning states pt was seen today in her office and that he is due for follow-up imaging. She inquires what imaging Dr. Melvyn Novas would like to have for pt: Chest CT or CXR. Looking into the pt's chart, I am unable to find any notes from [facility] regarding either. I let Junie Panning know I would send a message to MW to verify which imaging he would like pt to have performed. Erin expressed understanding and states she would be more than happy to place the order unless MW wanted to.   MW, please advise if you would like pt to have a f/u Chest CT or a CXR. Thank you.

## 2019-03-01 NOTE — Telephone Encounter (Signed)
ATC back [facility] and the office is currently closed  [facility] tomorrow during regular business hours

## 2019-03-01 NOTE — Telephone Encounter (Signed)
It's fine - let Junie Panning order feel whatever she feels is appropriate for her decision making process

## 2019-03-02 NOTE — Telephone Encounter (Signed)
LMTCB for Erin's nurse

## 2019-03-02 NOTE — Telephone Encounter (Signed)
Called Cindy back and got her voicemail again. I left a detailed message making her aware of MW's response. Nothing further was needed.

## 2019-03-02 NOTE — Telephone Encounter (Signed)
Cindy from [facility] is calling back. CB is [phone] ext [phone]

## 2019-03-02 NOTE — Telephone Encounter (Signed)
LMTCB x1 for Cindy.

## 2019-03-02 NOTE — Telephone Encounter (Signed)
Sean Sharp returned call and would like call back

## 2019-03-09 ENCOUNTER — Other Ambulatory Visit: Payer: Self-pay | Admitting: Physician Assistant

## 2019-03-09 DIAGNOSIS — D869 Sarcoidosis, unspecified: Secondary | ICD-10-CM

## 2019-03-16 ENCOUNTER — Ambulatory Visit
Admission: RE | Admit: 2019-03-16 | Discharge: 2019-03-16 | Disposition: A | Discharge status: Home / Self Care | Payer: 59 | Source: Ambulatory Visit | Attending: Physician Assistant | Admitting: Physician Assistant

## 2019-03-16 ENCOUNTER — Other Ambulatory Visit: Payer: Self-pay

## 2019-03-16 DIAGNOSIS — D869 Sarcoidosis, unspecified: Secondary | ICD-10-CM

## 2019-03-30 ENCOUNTER — Encounter: Payer: Self-pay | Admitting: Family Medicine

## 2019-04-08 ENCOUNTER — Other Ambulatory Visit: Payer: Self-pay | Admitting: Family Medicine

## 2019-04-18 ENCOUNTER — Telehealth (HOSPITAL_COMMUNITY): Payer: Self-pay | Admitting: *Deleted

## 2019-04-18 DIAGNOSIS — D869 Sarcoidosis, unspecified: Secondary | ICD-10-CM

## 2019-04-18 NOTE — Telephone Encounter (Signed)
Received referral from Marella Chimes, [place] at [facility] rheumatology, pt is being referred to eval for pah, has h/o sarcoidosis.  Per Dr Haroldine Laws pt will need echo prior to appt. Order placed and message sent to schedulers to arrange.

## 2019-04-22 ENCOUNTER — Telehealth (HOSPITAL_COMMUNITY): Payer: Self-pay | Admitting: Vascular Surgery

## 2019-04-22 NOTE — Telephone Encounter (Signed)
Left pt message to make new PULM HT w/ echo w/ Db, asked pt to call back to make appt

## 2019-06-21 ENCOUNTER — Telehealth (HOSPITAL_COMMUNITY): Payer: Self-pay

## 2019-06-21 NOTE — Telephone Encounter (Signed)

## 2019-06-22 ENCOUNTER — Ambulatory Visit (HOSPITAL_COMMUNITY)
Admission: RE | Admit: 2019-06-22 | Discharge: 2019-06-22 | Disposition: A | Discharge status: Home / Self Care | Payer: BC Managed Care – PPO | Source: Ambulatory Visit | Attending: Family Medicine | Admitting: Family Medicine

## 2019-06-22 ENCOUNTER — Other Ambulatory Visit (HOSPITAL_COMMUNITY): Payer: Self-pay

## 2019-06-22 ENCOUNTER — Ambulatory Visit (HOSPITAL_BASED_OUTPATIENT_CLINIC_OR_DEPARTMENT_OTHER)
Admission: RE | Admit: 2019-06-22 | Discharge: 2019-06-22 | Disposition: A | Discharge status: Home / Self Care | Payer: BC Managed Care – PPO | Source: Ambulatory Visit | Attending: Internal Medicine | Admitting: Internal Medicine

## 2019-06-22 ENCOUNTER — Other Ambulatory Visit: Payer: Self-pay

## 2019-06-22 ENCOUNTER — Telehealth (HOSPITAL_COMMUNITY): Payer: Self-pay

## 2019-06-22 ENCOUNTER — Encounter (HOSPITAL_COMMUNITY): Payer: Self-pay | Admitting: Internal Medicine

## 2019-06-22 VITALS — BP 138/84 | HR 75 | Wt 309.2 lb

## 2019-06-22 DIAGNOSIS — G4733 Obstructive sleep apnea (adult) (pediatric): Secondary | ICD-10-CM | POA: Insufficient documentation

## 2019-06-22 DIAGNOSIS — D869 Sarcoidosis, unspecified: Secondary | ICD-10-CM

## 2019-06-22 DIAGNOSIS — K219 Gastro-esophageal reflux disease without esophagitis: Secondary | ICD-10-CM | POA: Diagnosis not present

## 2019-06-22 DIAGNOSIS — R7303 Prediabetes: Secondary | ICD-10-CM | POA: Diagnosis not present

## 2019-06-22 DIAGNOSIS — I272 Pulmonary hypertension, unspecified: Secondary | ICD-10-CM

## 2019-06-22 DIAGNOSIS — I10 Essential (primary) hypertension: Secondary | ICD-10-CM

## 2019-06-22 DIAGNOSIS — Z6839 Body mass index (BMI) 39.0-39.9, adult: Secondary | ICD-10-CM | POA: Diagnosis not present

## 2019-06-22 DIAGNOSIS — Z87891 Personal history of nicotine dependence: Secondary | ICD-10-CM | POA: Diagnosis not present

## 2019-06-22 DIAGNOSIS — I129 Hypertensive chronic kidney disease with stage 1 through stage 4 chronic kidney disease, or unspecified chronic kidney disease: Secondary | ICD-10-CM | POA: Diagnosis not present

## 2019-06-22 DIAGNOSIS — Z79899 Other long term (current) drug therapy: Secondary | ICD-10-CM | POA: Insufficient documentation

## 2019-06-22 DIAGNOSIS — Z7952 Long term (current) use of systemic steroids: Secondary | ICD-10-CM | POA: Diagnosis not present

## 2019-06-22 DIAGNOSIS — N182 Chronic kidney disease, stage 2 (mild): Secondary | ICD-10-CM | POA: Insufficient documentation

## 2019-06-22 DIAGNOSIS — Z9119 Patient's noncompliance with other medical treatment and regimen: Secondary | ICD-10-CM | POA: Insufficient documentation

## 2019-06-22 HISTORY — PX: TRANSTHORACIC ECHOCARDIOGRAM: SHX275

## 2019-06-22 LAB — CBC
HCT: 49.6 % (ref 39.0–52.0)
Hemoglobin: 14.9 g/dL (ref 13.0–17.0)
MCH: 22.1 pg — ABNORMAL LOW (ref 26.0–34.0)
MCHC: 30 g/dL (ref 30.0–36.0)
MCV: 73.6 fL — ABNORMAL LOW (ref 80.0–100.0)
Platelets: 191 10*3/uL (ref 150–400)
RBC: 6.74 MIL/uL — ABNORMAL HIGH (ref 4.22–5.81)
RDW: 19 % — ABNORMAL HIGH (ref 11.5–15.5)
WBC: 10 10*3/uL (ref 4.0–10.5)
nRBC: 0 % (ref 0.0–0.2)

## 2019-06-22 LAB — BASIC METABOLIC PANEL
Anion gap: 8 (ref 5–15)
BUN: 15 mg/dL (ref 6–20)
CO2: 26 mmol/L (ref 22–32)
Calcium: 9.6 mg/dL (ref 8.9–10.3)
Chloride: 105 mmol/L (ref 98–111)
Creatinine, Ser: 1.25 mg/dL — ABNORMAL HIGH (ref 0.61–1.24)
GFR calc Af Amer: >60 mL/min (ref >60)
GFR calc non Af Amer: >60 mL/min (ref >60)
Glucose, Bld: 118 mg/dL — ABNORMAL HIGH (ref 70–99)
Potassium: 4.6 mmol/L (ref 3.5–5.1)
Sodium: 139 mmol/L (ref 135–145)

## 2019-06-22 MED ORDER — SPIRONOLACTONE 25 MG PO TABS: 12.5000 mg | ORAL_TABLET | Freq: Every day | ORAL | 3 refills | Status: DC

## 2019-06-22 MED ORDER — SODIUM CHLORIDE 0.9% FLUSH: 3.0000 mL | Freq: Two times a day (BID) | INTRAVENOUS | Status: DC

## 2019-06-22 NOTE — Addendum Note (Signed)
Encounter addended by: Shonna Chock, CMA on: 12/22/9676 10:12 AM  Actions taken: Flowsheet accepted

## 2019-06-22 NOTE — Addendum Note (Signed)
Encounter addended by: Valeda Malm, RN on: 06/22/2019 10:35 AM  Actions taken: Clinical Note Signed

## 2019-06-22 NOTE — Addendum Note (Signed)
Encounter addended by: Valeda Malm, RN on: 06/22/2019 10:22 AM  Actions taken: Order list changed, Diagnosis association updated, Charge Capture section accepted, Clinical Note Signed

## 2019-06-22 NOTE — H&P (View-Only) (Signed)
 ADV[facility]ED [facility] CLINIC CONSULT NOTE  Referring Physician: Erin Gray PA ([facility]) Primary Care: [facility] Primary Cardiologist: New  HPI:  44 y/o morbidly obese male with HTN, sarcoidosis, borderline DM2, OSA (noncompliant with CPAP) referred by Erin Gray, PA for further evaluation of PAH in the setting of sarcoidosis.   Works as an electrician. Has had sarcoid since 2011 and been followed by Dr. Wert. . Now on prednisone with plaquenil daily with Rheumatology. Recently had chest CT which showed dilated PA so referred here for PAH work=up   Has OSA and has CPAP but non compliant with it because he doesn't feel like it works. Does not have a sleep apnea doc. Doesn; sleep well snores heavily. Tired during the day Denies CP, syncope/presyncope. + mild edema. SOB on mild exertion.   Graduated HS at 185 pounds.   Smoked minimally quit 1997. No h/o DVT/PE.   Echo today EF 60-65% RV ok not enough TR to estimate RVSP   CT 10/20 1. Mediastinal, hilar and pulmonary parenchymal changes of sarcoid, the latter of which appear mildly progressive from 01/05/2017. 2. Enlarged pulmonic trunk, indicative of pulmonary arterial hypertension.   Review of Systems: [y] = yes, [ ] = no   General: Weight gain [ ]; Weight loss [ ]; Anorexia [ ]; Fatigue [y ]; Fever [ ]; Chills [ ]; Weakness [ ]  Cardiac: Chest pain/pressure [ ]; Resting SOB [ ]; Exertional SOB [ y]; Orthopnea [ ]; Pedal Edema [ ]y; Palpitations [ ]; Syncope [ ]; Presyncope [ ]; Paroxysmal nocturnal dyspnea[ ]  Pulmonary: Cough [ ]; Wheezing[ ]; Hemoptysis[ ]; Sputum [ ]; Snoring [ ]  GI: Vomiting[ ]; Dysphagia[ ]; Melena[ ]; Hematochezia [ ]; Heartburn[ ]; Abdominal pain [ ]; Constipation [ ]; Diarrhea [ ]; BRBPR [ ]  GU: Hematuria[ ]; Dysuria [ ]; Nocturia[ ]  Vascular: Pain in legs with walking [ ]; Pain in feet with lying flat [ ]; Non-healing sores [ ]; Stroke [ ]; TIA [ ]; Slurred speech [ ];  Neuro: Headaches[ ];  Vertigo[ ]; Seizures[ ]; Paresthesias[ ];Blurred vision [ ]; Diplopia [ ]; Vision changes [ ]  Ortho/Skin: Arthritis [y ]; Joint pain [ y]; Muscle pain [ ]; Joint swelling [ ]; Back Pain [ ]; Rash [ ]  Psych: Depression[ ]; Anxiety[ ]  Heme: Bleeding problems [ ]; Clotting disorders [ ]; Anemia [ ]  Endocrine: Diabetes [ ]; Thyroid dysfunction[ ]   Past Medical History:  Diagnosis Date  . Allergic rhinitis   . 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  Hemoglobin electrophoresis normal 2016.  . Chronic renal insufficiency, stage II (mild)   . Cutaneous sarcoidosis    [facility]: as of 02/2019->plaquenil + azathioprine  . Hoarseness 2015   [facility] 04/2014-normal laryngoscopy: prednisone taper and bid nexium recommended.  No signif help so pt got 2nd opinion at [facility]   . HTN (hypertension)   . Hydrocele    left  . LPRD (laryngopharyngeal reflux disease)   . Obesity, Class II, BMI 35-39.9   . Olecranon bursitis of right elbow 04/29/2018   persistent/recurrent s/p aspiration-->bursa excision   . Peripheral edema    left leg>right leg  . Prediabetes 02/2017   A1c 6.3% (right when he was starting to take prednisone prn for treatment of his sarcoidosis).  HbA1c 08/2017=6.3%.  . 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 Wert suspects it was smoldering since 2011.  Skin   bx of cutaneous lesion confirmed dx of sarcoidosis 02/2017.  Pt improving fall 2018 with plaquenil and prednisone.  Pt self d/c'd plaquenil, takes pred prn per his own judgement, pt commonly takes meds incorrectly/not as recommended by MDs.  . Vocal cord polyps 2015   surgery [facility]     Current Outpatient Medications  Medication Sig Dispense Refill  . amLODipine (NORVASC) 10 MG tablet TAKE 1 TABLET BY MOUTH EVERY DAY 90 tablet 1  . hydrocortisone butyrate (LUCOID) 0.1 % CREA cream Apply 1 application topically 2 (two) times daily. 60 g 6  . hydroxychloroquine (PLAQUENIL) 200 MG tablet Take 2 daily  0  . metoprolol  succinate (TOPROL-XL) 25 MG 24 hr tablet TAKE 1 TABLET BY MOUTH EVERY DAY 90 tablet 1  . pantoprazole (PROTONIX) 40 MG tablet Take 30- 60 min before your first and last meals of the day 180 tablet 1  . predniSONE (DELTASONE) 10 MG tablet Take 10 mg by mouth daily with breakfast.    . predniSONE (DELTASONE) 20 MG tablet Take 20 mg by mouth daily with breakfast.    . traZODone (DESYREL) 50 MG tablet 1-2 tabs po qhs prn insomnia 30 tablet 1   No current facility-administered medications for this encounter.    Allergies  Allergen Reactions  . Bee Venom Swelling      Social History   Socioeconomic History  . Marital status: Married    Spouse name: Not on file  . Number of children: 2  . Years of education: Not on file  . Highest education level: Not on file  Occupational History  . Occupation: electrician  Tobacco Use  . Smoking status: Former Smoker    Packs/day: 0.30    Years: 1.50    Pack years: 0.45    Types: Cigarettes    Quit date: 06/10/1995    Years since quitting: 24.0  . Smokeless tobacco: Never Used  Substance and Sexual Activity  . Alcohol use: Yes    Comment: occasional  . Drug use: No  . Sexual activity: Not on file  Other Topics Concern  . Not on file  Social History Narrative   Married, 2 children (12 and 8 y/o).   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   Orig from [place].   No Tobacco.  Rare alcohol.  No drugs.   Enjoys fishing, model railroading, wood working, electronics.      Social Determinants of Health   Financial Resource Strain:   . Difficulty of Paying Living Expenses: Not on file  Food Insecurity:   . Worried About Running Out of Food in the Last Year: Not on file  . Ran Out of Food in the Last Year: Not on file  Transportation Needs:   . Lack of Transportation (Medical): Not on file  . Lack of Transportation (Non-Medical): Not on file  Physical Activity:   . Days of Exercise per Week: Not on file  . Minutes of Exercise per  Session: Not on file  Stress:   . Feeling of Stress : Not on file  Social Connections:   . Frequency of Communication with Friends and Family: Not on file  . Frequency of Social Gatherings with Friends and Family: Not on file  . Attends Religious Services: Not on file  . Active Member of Clubs or Organizations: Not on file  . Attends Club or Organization Meetings: Not on file  . Marital Status: Not on file  Intimate Partner Violence:   . Fear of Current or   Ex-Partner: Not on file  . Emotionally Abused: Not on file  . Physically Abused: Not on file  . Sexually Abused: Not on file      Family History  Problem Relation Age of Onset  . Sarcoidosis Father        ?  . Lactose intolerance Father   . Other Mother        colon issues-portion of colon removed  . Lactose intolerance Mother   . Cancer Paternal Grandfather        ?    Vitals:   06/22/19 0859  BP: 138/84  Pulse: 75  SpO2: 97%  Weight: (!) 140.3 kg (309 lb 3.2 oz)    PHYSICAL EXAM: General:  Sitting on exam table. When I walked in to the exam room sitting straight up fast asleep snoring HEENT: normal Neck: supple. Rash on R neck no JVD. Carotids 2+ bilat; no bruits. No lymphadenopathy or thryomegaly appreciated. Cor: PMI nondisplaced. Regular rate & rhythm. No rubs, gallops or murmurs. Lungs: clear Abdomen: obese soft, nontender, nondistended. No hepatosplenomegaly. No bruits or masses. Good bowel sounds. Extremities: no cyanosis, clubbing, rash, edema Neuro: alert & oriented x 3, cranial nerves grossly intact. moves all 4 extremities w/o difficulty. Affect pleasant.  ECG:   ASSESSMENT & PLAN:  1. Enlarged pulmonary artery in setting of sarcoid and OSA - echo today reviewed personally. 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 OSA - suspect very severe  - will order home sleep  study  3. HTN - mildly elevated  - followed by Dr. [facility] - was supposed to be on HCTZ and irbesartan - add spiro 12.5.   4. Morbid obesity - discussed [place] diet  Vertie Dibbern, MD  9:54 AM     

## 2019-06-22 NOTE — Telephone Encounter (Signed)
Height:6'2      Weight:309.2lbs BMI:39.70  Today's Date:06/22/2019  STOP BANG RISK ASSESSMENT S (snore) Have you been told that you snore?     YES   T (tired) Are you often tired, fatigued, or sleepy during the day?   YES  O (obstruction) Do you stop breathing, choke, or gasp during sleep? NO   P (pressure) Do you have or are you being treated for high blood pressure? YES   B (BMI) Is your body index greater than 35 kg/m? YES   A (age) Are you 44 years old or older? NO   N (neck) Do you have a neck circumference greater than 16 inches?   NO   G (gender) Are you a male? YES   TOTAL STOP/BANG "YES" ANSWERS 5                                                                       For Office Use Only              Procedure Order Form    YES to 3+ Stop Bang questions OR two clinical symptoms - patient qualifies for WatchPAT (CPT 95800)      Clinical Notes: Will consult Sleep Specialist and refer for management of therapy due to patient increased risk of Sleep Apnea. Ordering a sleep study due to the following two clinical symptoms: Excessive daytime sleepiness G47.10 / Gastroesophageal reflux K21.9 / Nocturia R35.1 /Loud snoring R06.83  / Unrefreshed by sleep G47.8 / History of high blood pressure R03.0   Which test do you need, WP1 or WP300??? WP1  . Do you have access to a smart device containing the app stores?  If YES, then -->WP1

## 2019-06-22 NOTE — Patient Instructions (Addendum)
START Spironolactone 12.54m (1/2 tab) daily  Your provider has recommended that you have a home sleep study.  [employer] is the company that does these test.  They will contact you by phone and must speak with you before they can ship the equipment.  Once they have spoken with you they will send the equipment right to your home with instructions on how to set it up.  Once you have completed the test you just dispose of the equipment, the information is automatically uploaded to [employer].  IF you have any questions or issues with the equipment please call the company directly at [phone]  If your test is positive for sleep apnea and you need a home CPAP machine you will be contacted by Dr TTheodosia Blenderoffice ([facility] to set this up.   Your physician has recommended that you have a pulmonary function test. Pulmonary Function Tests are a group of tests that measure how well air moves in and out of your lungs.  Due to covid restrictions, the hospital is currently not doing this test. You will get a call as soon as this test becomes available.   Labs today and repeat in 2 weeks We will only contact you if something comes back abnormal or we need to make some changes. Otherwise no news is good news!   Your physician recommends that you schedule a follow-up appointment in: 2 months with Dr BHaroldine Laws  [facility] [id number]: [phone]: 3[place] 06/22/2019  You are scheduled for a Cardiac Catheterization on Monday, January 18 with Dr. DGlori Bickers 1. [facility] [address]  [id number] 5:30 AM (This time is two hours before your procedure to ensure your preparation). Free valet parking service is available.   Special note: Every  effort is made to have your procedure done on time. Please understand that emergencies sometimes delay scheduled procedures.  2. Diet: Do not eat solid foods OR drink liquids after midnight.     3. Labs: Done today in office  You will need a pre procedure COVID test     WHEN:  Friday, January 15th, 2021 at 12:40p  [facility] [address] This is a drive thru testing site, you will remain in your car. Be sure to get in the line FOR PROCEDURES Once you have been swabbed you will need to remain home in quarantine until you return for your procedure.   4. Medication instructions in preparation for your procedure:   Contrast Allergy: No   PLEASE HOLD ALL MORNING MEDICATIONS ON THE DAY OF YOUR PROCEDURE  5. Plan for one night stay--bring personal belongings. 6. Bring a current list of your medications and current insurance cards. 7. You MUST have a responsible person to drive you home. 8. Someone MUST be with you the first 24 hours after you arrive home or your discharge will be delayed. 9. Please wear clothes that are easy to get on and off and wear slip-on shoes.  Thank you for allowing [facility] for you!   -- [place] [facility]

## 2019-06-22 NOTE — Progress Notes (Signed)
  Echocardiogram 2D Echocardiogram has been performed.  Sean Sharp 06/22/2019, 8:46 AM

## 2019-06-22 NOTE — Progress Notes (Signed)
ADVANCED HF CLINIC CONSULT NOTE  Referring Physician: Marella Chimes PA [facility]) Primary Care: [facility] Primary Cardiologist: New  HPI:  44 y/o morbidly obese male with HTN, sarcoidosis, borderline DM2, OSA (noncompliant with CPAP) referred by Marella Chimes, PA for further evaluation of [place] in the setting of sarcoidosis.   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 Melvyn Novas. . Now on prednisone with plaquenil daily with Rheumatology. Recently had chest CT which showed dilated PA so referred here for [facility] work=up   Has OSA and has CPAP but non compliant with it because he doesn't feel like it works. Does not have a sleep apnea doc. Doesn; sleep well snores heavily. Tired during the day Denies CP, syncope/presyncope. + mild edema. SOB on mild exertion.   Graduated HS at 185 pounds.   Smoked minimally quit 1997. No h/o DVT/PE.   Echo today EF 60-65% RV ok not enough TR to estimate RVSP   CT 10/20 1. Mediastinal, hilar and pulmonary parenchymal changes of sarcoid, the latter of which appear mildly progressive from 01/05/2017. 2. Enlarged pulmonic trunk, indicative of pulmonary arterial hypertension.   Review of Systems: [y] = yes, _0  = no   General: Weight gain _1 ; Weight loss _2 ; Anorexia _3 ; Fatigue Blue.Reese ]; Fever _4 ; Chills _5 ; Weakness _6   Cardiac: Chest pain/pressure _7 ; Resting SOB _8 ; Exertional SOB [ y]; Orthopnea _9 ; Pedal Edema _10 y; Palpitations _11 ; Syncope _12 ; Presyncope _13 ; Paroxysmal nocturnal dyspnea_14   Pulmonary: Cough _15 ; Wheezing_16 ; Hemoptysis_17 ; Sputum _18 ; Snoring _19   GI: Vomiting_20 ; Dysphagia_21 ; Melena_22 ; Hematochezia _23 ; Heartburn_24 ; Abdominal pain _25 ; Constipation _26 ; Diarrhea _27 ; BRBPR _28   GU: Hematuria_29 ; Dysuria _30 ; Nocturia_31   Vascular: Pain in legs with walking _32 ; Pain in feet with lying flat _33 ; Non-healing sores _34 ; Stroke _35 ; TIA _36 ; Slurred speech _37 ;  Neuro: Headaches_38 ;  Vertigo_39 ; Seizures_40 ; Paresthesias_41 ;Blurred vision _42 ; Diplopia _43 ; Vision changes _44   Ortho/Skin: Arthritis Blue.Reese ]; Joint pain [ y]; Muscle pain _45 ; Joint swelling _46 ; Back Pain _47 ; Rash _48   Psych: Depression_49 ; Anxiety_50   Heme: Bleeding problems _51 ; Clotting disorders _52 ; Anemia _53   Endocrine: Diabetes _54 ; Thyroid dysfunction_55    Past Medical History:  Diagnosis Date  . Allergic rhinitis   . 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  Hemoglobin electrophoresis normal 2016.  Marland Kitchen Chronic renal insufficiency, stage II (mild)   . Cutaneous sarcoidosis    [place] rheum: as of 02/2019->plaquenil + azathioprine  . Hoarseness 2015   [facility] 04/2014-normal laryngoscopy: prednisone taper and bid nexium recommended.  No signif help so pt got 2nd opinion at [facility]   . HTN (hypertension)   . Hydrocele    left  . LPRD (laryngopharyngeal reflux disease)   . Obesity, Class II, BMI 35-39.9   . Olecranon bursitis of right elbow 04/29/2018   persistent/recurrent s/p aspiration-->bursa excision   . Peripheral edema    left leg>right leg  . Prediabetes 02/2017   A1c 6.3% (right when he was starting to take prednisone prn for treatment of his sarcoidosis).  HbA1c 08/2017=6.3%.  . 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 Melvyn Novas suspects it was smoldering since 2011.  Skin  bx of cutaneous lesion confirmed dx of sarcoidosis 02/2017.  Pt improving fall 2018 with plaquenil and prednisone.  Pt self d/c'd plaquenil, takes pred prn per his own judgement, pt commonly takes meds incorrectly/not as recommended by MDs.  . Vocal cord polyps 2015   surgery [facility]     Current Outpatient Medications  Medication Sig Dispense Refill  . amLODipine (NORVASC) 10 MG tablet TAKE 1 TABLET BY MOUTH EVERY DAY 90 tablet 1  . hydrocortisone butyrate (LUCOID) 0.1 % CREA cream Apply 1 application topically 2 (two) times daily. 60 g 6  . hydroxychloroquine (PLAQUENIL) 200 MG tablet Take 2 daily  0  . metoprolol  succinate (TOPROL-XL) 25 MG 24 hr tablet TAKE 1 TABLET BY MOUTH EVERY DAY 90 tablet 1  . pantoprazole (PROTONIX) 40 MG tablet Take 30- 60 min before your first and last meals of the day 180 tablet 1  . predniSONE (DELTASONE) 10 MG tablet Take 10 mg by mouth daily with breakfast.    . predniSONE (DELTASONE) 20 MG tablet Take 20 mg by mouth daily with breakfast.    . traZODone (DESYREL) 50 MG tablet 1-2 tabs po qhs prn insomnia 30 tablet 1   No current facility-administered medications for this encounter.    Allergies  Allergen Reactions  . Bee Venom Swelling      Social History   Socioeconomic History  . Marital status: Married    Spouse name: Not on file  . Number of children: 2  . Years of education: Not on file  . Highest education level: Not on file  Occupational History  . Occupation: Clinical biochemist  Tobacco Use  . Smoking status: Former Smoker    Packs/day: 0.30    Years: 1.50    Pack years: 0.45    Types: Cigarettes    Quit date: 06/10/1995    Years since quitting: 24.0  . Smokeless tobacco: Never Used  Substance and Sexual Activity  . Alcohol use: Yes    Comment: occasional  . Drug use: No  . Sexual activity: Not on file  Other Topics Concern  . Not on file  Social History Narrative   Married, 2 children (29 and 80 y/o).   Occupation: Clinical biochemist Editor, commissioning).   Orig from [facility] [employer] [place] [employer]   No Tobacco.  Rare alcohol.  No drugs.   Enjoys fishing, Environmental health practitioner, wood working, Research officer, trade union.      Social Determinants of Health   Financial Resource Strain:   . Difficulty of Paying Living Expenses: Not on file  Food Insecurity:   . Worried About Charity fundraiser in the Last Year: Not on file  . Ran Out of Food in the Last Year: Not on file  Transportation Needs:   . Lack of Transportation (Medical): Not on file  . Lack of Transportation (Non-Medical): Not on file  Physical Activity:   . Days of Exercise per Week: Not on file  . Minutes of Exercise per  Session: Not on file  Stress:   . Feeling of Stress : Not on file  Social Connections:   . Frequency of Communication with Friends and Family: Not on file  . Frequency of Social Gatherings with Friends and Family: Not on file  . Attends Religious Services: Not on file  . Active Member of Clubs or Organizations: Not on file  . Attends Archivist Meetings: Not on file  . Marital Status: Not on file  Intimate Partner Violence:   . Fear of Current or  Ex-Partner: Not on file  . Emotionally Abused: Not on file  . Physically Abused: Not on file  . Sexually Abused: Not on file      Family History  Problem Relation Age of Onset  . Sarcoidosis Father        ?  Marland Kitchen Lactose intolerance Father   . Other Mother        colon issues-portion of colon removed  . Lactose intolerance Mother   . Cancer Paternal Grandfather        ?    Vitals:   06/22/19 0859  BP: 138/84  Pulse: 75  SpO2: 97%  Weight: (!) 140.3 kg (309 lb 3.2 oz)    PHYSICAL EXAM: General:  Sitting on exam table. When I walked in to the exam room sitting straight up fast asleep snoring HEENT: normal Neck: supple. Rash on R neck no JVD. Carotids 2+ bilat; no bruits. No lymphadenopathy or thryomegaly appreciated. Cor: PMI nondisplaced. Regular rate & rhythm. No rubs, gallops or murmurs. Lungs: clear Abdomen: obese soft, nontender, nondistended. No hepatosplenomegaly. No bruits or masses. Good bowel sounds. Extremities: no cyanosis, clubbing, rash, edema Neuro: alert & oriented x 3, cranial nerves grossly intact. moves all 4 extremities w/o difficulty. Affect pleasant.  ECG:   ASSESSMENT & PLAN:  1. Enlarged pulmonary artery in setting of sarcoid and OSA - echo today reviewed personally. 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 OSA - suspect very severe  - will order home sleep  study  3. HTN - mildly elevated  - followed by Dr. Melford Aase - was supposed to be on HCTZ and irbesartan - add spiro 12.5.   4. Morbid obesity - discussed [employer]  Glori Bickers, MD  9:54 AM

## 2019-06-23 ENCOUNTER — Telehealth (HOSPITAL_COMMUNITY): Payer: Self-pay

## 2019-06-23 NOTE — Telephone Encounter (Signed)
Faxed ov notes, stop bang, order and demographics to [employer] @ [phone].

## 2019-06-24 ENCOUNTER — Other Ambulatory Visit (HOSPITAL_COMMUNITY)
Admission: RE | Admit: 2019-06-24 | Discharge: 2019-06-24 | Disposition: A | Discharge status: Home / Self Care | Payer: BC Managed Care – PPO | Source: Ambulatory Visit | Attending: Internal Medicine | Admitting: Internal Medicine

## 2019-06-24 DIAGNOSIS — Z20822 Contact with and (suspected) exposure to covid-19: Secondary | ICD-10-CM | POA: Insufficient documentation

## 2019-06-24 DIAGNOSIS — Z01812 Encounter for preprocedural laboratory examination: Secondary | ICD-10-CM | POA: Insufficient documentation

## 2019-06-24 LAB — SARS CORONAVIRUS 2 (TAT 6-24 HRS): SARS Coronavirus 2: NEGATIVE

## 2019-06-27 ENCOUNTER — Other Ambulatory Visit: Payer: Self-pay

## 2019-06-27 ENCOUNTER — Ambulatory Visit (HOSPITAL_COMMUNITY)
Admission: RE | Admit: 2019-06-27 | Discharge: 2019-06-27 | Disposition: A | Discharge status: Home / Self Care | Payer: BC Managed Care – PPO | Attending: Internal Medicine | Admitting: Internal Medicine

## 2019-06-27 ENCOUNTER — Ambulatory Visit (HOSPITAL_COMMUNITY)
Admission: RE | Disposition: A | Discharge status: Home / Self Care | Payer: Self-pay | Source: Home / Self Care | Attending: Internal Medicine

## 2019-06-27 DIAGNOSIS — I272 Pulmonary hypertension, unspecified: Secondary | ICD-10-CM

## 2019-06-27 DIAGNOSIS — G4733 Obstructive sleep apnea (adult) (pediatric): Secondary | ICD-10-CM | POA: Insufficient documentation

## 2019-06-27 DIAGNOSIS — Z79899 Other long term (current) drug therapy: Secondary | ICD-10-CM | POA: Diagnosis not present

## 2019-06-27 DIAGNOSIS — Z7952 Long term (current) use of systemic steroids: Secondary | ICD-10-CM | POA: Diagnosis not present

## 2019-06-27 DIAGNOSIS — R0602 Shortness of breath: Secondary | ICD-10-CM | POA: Diagnosis not present

## 2019-06-27 DIAGNOSIS — N182 Chronic kidney disease, stage 2 (mild): Secondary | ICD-10-CM | POA: Insufficient documentation

## 2019-06-27 DIAGNOSIS — D869 Sarcoidosis, unspecified: Secondary | ICD-10-CM | POA: Diagnosis not present

## 2019-06-27 DIAGNOSIS — K219 Gastro-esophageal reflux disease without esophagitis: Secondary | ICD-10-CM | POA: Diagnosis not present

## 2019-06-27 DIAGNOSIS — Z9119 Patient's noncompliance with other medical treatment and regimen: Secondary | ICD-10-CM | POA: Diagnosis not present

## 2019-06-27 DIAGNOSIS — Z87891 Personal history of nicotine dependence: Secondary | ICD-10-CM | POA: Insufficient documentation

## 2019-06-27 DIAGNOSIS — I129 Hypertensive chronic kidney disease with stage 1 through stage 4 chronic kidney disease, or unspecified chronic kidney disease: Secondary | ICD-10-CM | POA: Insufficient documentation

## 2019-06-27 DIAGNOSIS — I2721 Secondary pulmonary arterial hypertension: Secondary | ICD-10-CM | POA: Diagnosis not present

## 2019-06-27 DIAGNOSIS — R7303 Prediabetes: Secondary | ICD-10-CM | POA: Insufficient documentation

## 2019-06-27 DIAGNOSIS — Z6839 Body mass index (BMI) 39.0-39.9, adult: Secondary | ICD-10-CM | POA: Insufficient documentation

## 2019-06-27 DIAGNOSIS — J310 Chronic rhinitis: Secondary | ICD-10-CM | POA: Diagnosis not present

## 2019-06-27 HISTORY — PX: TRANSTHORACIC ECHOCARDIOGRAM: SHX275

## 2019-06-27 HISTORY — PX: RIGHT HEART CATH: CATH118263

## 2019-06-27 LAB — POCT I-STAT EG7
Acid-Base Excess: 1 mmol/L (ref 0.0–2.0)
Acid-Base Excess: 2 mmol/L (ref 0.0–2.0)
Acid-Base Excess: 2 mmol/L (ref 0.0–2.0)
Bicarbonate: 25.9 mmol/L (ref 20.0–28.0)
Bicarbonate: 26.7 mmol/L (ref 20.0–28.0)
Bicarbonate: 27.1 mmol/L (ref 20.0–28.0)
Calcium, Ion: 1.13 mmol/L — ABNORMAL LOW (ref 1.15–1.40)
Calcium, Ion: 1.22 mmol/L (ref 1.15–1.40)
Calcium, Ion: 1.23 mmol/L (ref 1.15–1.40)
HCT: 46 % (ref 39.0–52.0)
HCT: 47 % (ref 39.0–52.0)
HCT: 48 % (ref 39.0–52.0)
Hemoglobin: 15.6 g/dL (ref 13.0–17.0)
Hemoglobin: 16 g/dL (ref 13.0–17.0)
Hemoglobin: 16.3 g/dL (ref 13.0–17.0)
O2 Saturation: 72 %
O2 Saturation: 72 %
O2 Saturation: 75 %
Potassium: 3.6 mmol/L (ref 3.5–5.1)
Potassium: 3.8 mmol/L (ref 3.5–5.1)
Potassium: 3.8 mmol/L (ref 3.5–5.1)
Sodium: 141 mmol/L (ref 135–145)
Sodium: 141 mmol/L (ref 135–145)
Sodium: 143 mmol/L (ref 135–145)
TCO2: 27 mmol/L (ref 22–32)
TCO2: 28 mmol/L (ref 22–32)
TCO2: 28 mmol/L (ref 22–32)
pCO2, Ven: 42.3 mmHg — ABNORMAL LOW (ref 44.0–60.0)
pCO2, Ven: 42.9 mmHg — ABNORMAL LOW (ref 44.0–60.0)
pCO2, Ven: 43.1 mmHg — ABNORMAL LOW (ref 44.0–60.0)
pH, Ven: 7.386 (ref 7.250–7.430)
pH, Ven: 7.403 (ref 7.250–7.430)
pH, Ven: 7.415 (ref 7.250–7.430)
pO2, Ven: 38 mmHg (ref 32.0–45.0)
pO2, Ven: 39 mmHg (ref 32.0–45.0)
pO2, Ven: 39 mmHg (ref 32.0–45.0)

## 2019-06-27 SURGERY — RIGHT HEART CATH
Anesthesia: LOCAL

## 2019-06-27 MED ORDER — HEPARIN SODIUM (PORCINE) 1000 UNIT/ML IJ SOLN
INTRAMUSCULAR | Status: AC
  Filled 2019-06-27: qty 1

## 2019-06-27 MED ORDER — HYDRALAZINE HCL 20 MG/ML IJ SOLN: 10.0000 mg | INTRAMUSCULAR | Status: DC | PRN

## 2019-06-27 MED ORDER — ACETAMINOPHEN 325 MG PO TABS: 650.0000 mg | ORAL_TABLET | ORAL | Status: DC | PRN

## 2019-06-27 MED ORDER — SODIUM CHLORIDE 0.9% FLUSH: 3.0000 mL | INTRAVENOUS | Status: DC | PRN

## 2019-06-27 MED ORDER — ASPIRIN 81 MG PO CHEW
CHEWABLE_TABLET | ORAL | Status: AC
  Filled 2019-06-27: qty 1

## 2019-06-27 MED ORDER — LABETALOL HCL 5 MG/ML IV SOLN: 10.0000 mg | INTRAVENOUS | Status: DC | PRN

## 2019-06-27 MED ORDER — ONDANSETRON HCL 4 MG/2ML IJ SOLN: 4.0000 mg | Freq: Four times a day (QID) | INTRAMUSCULAR | Status: DC | PRN

## 2019-06-27 MED ORDER — SODIUM CHLORIDE 0.9 % IV SOLN: INTRAVENOUS | Status: DC

## 2019-06-27 MED ORDER — SODIUM CHLORIDE 0.9 % IV SOLN: 250.0000 mL | INTRAVENOUS | Status: DC | PRN

## 2019-06-27 MED ORDER — HEPARIN (PORCINE) IN NACL 1000-0.9 UT/500ML-% IV SOLN
INTRAVENOUS | Status: DC | PRN
  Administered 2019-06-27: 500 mL

## 2019-06-27 MED ORDER — SODIUM CHLORIDE 0.9% FLUSH: 3.0000 mL | Freq: Two times a day (BID) | INTRAVENOUS | Status: DC

## 2019-06-27 MED ORDER — ASPIRIN 81 MG PO CHEW: 81.0000 mg | CHEWABLE_TABLET | ORAL | Status: DC

## 2019-06-27 MED ORDER — LIDOCAINE HCL (PF) 1 % IJ SOLN
INTRAMUSCULAR | Status: AC
  Filled 2019-06-27: qty 30

## 2019-06-27 MED ORDER — LIDOCAINE HCL (PF) 1 % IJ SOLN
INTRAMUSCULAR | Status: DC | PRN
  Administered 2019-06-27: 2 mL via INTRADERMAL

## 2019-06-27 SURGICAL SUPPLY — 6 items
CATH BALLN WEDGE 5F 110CM (CATHETERS) ×1 IMPLANT
PACK CARDIAC CATHETERIZATION (CUSTOM PROCEDURE TRAY) ×2 IMPLANT
SHEATH GLIDE SLENDER 4/5FR (SHEATH) ×1 IMPLANT
TRANSDUCER W/STOPCOCK (MISCELLANEOUS) ×2 IMPLANT
TUBING ART PRESS 72  MALE/FEM (TUBING) ×1
TUBING ART PRESS 72 MALE/FEM (TUBING) IMPLANT

## 2019-06-27 NOTE — Progress Notes (Signed)
Ambulated In hallway tol well. D/c instructions reviewed with pt voices understanding.

## 2019-06-27 NOTE — Discharge Instructions (Signed)
Site Care  This sheet gives you information about how to care for yourself after your procedure. Your health care provider may also give you more specific instructions. If you have problems or questions, contact your health care provider. What can I expect after the procedure? After the procedure, it is common to have:  Bruising and tenderness at the catheter insertion area. Follow these instructions at home: Medicines  Take over-the-counter and prescription medicines only as told by your health care provider. Insertion site care  Follow instructions from your health care provider about how to take care of your insertion site. Make sure you: ? Wash your hands with soap and water before you change your bandage (dressing). If soap and water are not available, use hand sanitizer. ? Change your dressing as told by your health care provider. ? Leave stitches (sutures), skin glue, or adhesive strips in place. These skin closures may need to stay in place for 2 weeks or longer. If adhesive strip edges start to loosen and curl up, you may trim the loose edges. Do not remove adhesive strips completely unless your health care provider tells you to do that.  Check your insertion site every day for signs of infection. Check for: ? Redness, swelling, or pain. ? Fluid or blood. ? Pus or a bad smell. ? Warmth.  Do not take baths, swim, or use a hot tub until your health care provider approves.  You may shower 24-48 hours after the procedure, or as directed by your health care provider. ? Remove the dressing and gently wash the site with plain soap and water. ? Pat the area dry with a clean towel. ? Do not rub the site. That could cause bleeding.  Do not apply powder or lotion to the site. Activity   For 24 hours after the procedure, or as directed by your health care provider: ? Do not flex or bend the affected arm. ? Do not push or pull heavy objects with the affected arm. ? Do not drive  yourself home from the hospital or clinic. You may drive 24 hours after the procedure unless your health care provider tells you not to. ? Do not operate machinery or power tools.  Do not lift anything that is heavier than 10 lb (4.5 kg), or the limit that you are told, until your health care provider says that it is safe.  Ask your health care provider when it is okay to: ? Return to work or school. ? Resume usual physical activities or sports. ? Resume sexual activity. General instructions  If the catheter site starts to bleed, raise your arm and put firm pressure on the site. If the bleeding does not stop, get help right away. This is a medical emergency.  If you went home on the same day as your procedure, a responsible adult should be with you for the first 24 hours after you arrive home.  Keep all follow-up visits as told by your health care provider. This is important. Contact a health care provider if:  You have a fever.  You have redness, swelling, or yellow drainage around your insertion site. Get help right away if:  You have unusual pain at the radial site.  The catheter insertion area swells very fast.  The insertion area is bleeding, and the bleeding does not stop when you hold steady pressure on the area.  Your arm or hand becomes pale, cool, tingly, or numb. These symptoms may represent a serious problem that  is an emergency. Do not wait to see if the symptoms will go away. Get medical help right away. Call your local emergency services (911 in the [place] Do not drive yourself to the hospital. Summary  After the procedure, it is common to have bruising and tenderness at the site.  Follow instructions from your health care provider about how to take care of your radial site wound. Check the wound every day for signs of infection.  Do not lift anything that is heavier than 10 lb (4.5 kg), or the limit that you are told, until your health care provider says that it  is safe. This information is not intended to replace advice given to you by your health care provider. Make sure you discuss any questions you have with your health care provider. Document Revised: 07/01/2017 Document Reviewed: 07/01/2017 Elsevier Patient Education  2020 [employer].

## 2019-06-27 NOTE — Interval H&P Note (Signed)
History and Physical Interval Note:  06/27/2019 8:04 AM  Sean Sharp  has presented today for surgery, with the diagnosis of heart failure.  The various methods of treatment have been discussed with the patient and family. After consideration of risks, benefits and other options for treatment, the patient has consented to  Procedure(s): RIGHT HEART CATH (N/A) as a surgical intervention.  The patient's history has been reviewed, patient examined, no change in status, stable for surgery.  I have reviewed the patient's chart and labs.  Questions were answered to the patient's satisfaction.     Ulyana Pitones

## 2019-06-28 MED FILL — Heparin Sodium (Porcine) Inj 1000 Unit/ML: INTRAMUSCULAR | Qty: 10 | Status: AC

## 2019-07-04 ENCOUNTER — Encounter: Payer: Self-pay | Admitting: Family Medicine

## 2019-07-06 ENCOUNTER — Other Ambulatory Visit: Payer: Self-pay

## 2019-07-06 ENCOUNTER — Ambulatory Visit (HOSPITAL_COMMUNITY)
Admission: RE | Admit: 2019-07-06 | Discharge: 2019-07-06 | Disposition: A | Discharge status: Home / Self Care | Payer: BC Managed Care – PPO | Source: Ambulatory Visit | Attending: Internal Medicine | Admitting: Internal Medicine

## 2019-07-06 DIAGNOSIS — I272 Pulmonary hypertension, unspecified: Secondary | ICD-10-CM | POA: Diagnosis not present

## 2019-07-06 LAB — BASIC METABOLIC PANEL
Anion gap: 9 (ref 5–15)
BUN: 19 mg/dL (ref 6–20)
CO2: 22 mmol/L (ref 22–32)
Calcium: 9.3 mg/dL (ref 8.9–10.3)
Chloride: 105 mmol/L (ref 98–111)
Creatinine, Ser: 1.35 mg/dL — ABNORMAL HIGH (ref 0.61–1.24)
GFR calc Af Amer: >60 mL/min (ref >60)
GFR calc non Af Amer: >60 mL/min (ref >60)
Glucose, Bld: 114 mg/dL — ABNORMAL HIGH (ref 70–99)
Potassium: 4.9 mmol/L (ref 3.5–5.1)
Sodium: 136 mmol/L (ref 135–145)

## 2019-07-11 ENCOUNTER — Telehealth (HOSPITAL_COMMUNITY): Payer: Self-pay

## 2019-07-11 NOTE — Telephone Encounter (Signed)
Received notification from [employer] that they have attempted 3 times to get the patient to order sleep test. The company said that they will contact again in 2 weeks for final attempt.

## 2019-07-13 ENCOUNTER — Other Ambulatory Visit: Payer: Self-pay

## 2019-07-13 ENCOUNTER — Encounter (HOSPITAL_COMMUNITY): Payer: Self-pay | Admitting: Emergency Medicine

## 2019-07-13 ENCOUNTER — Ambulatory Visit (HOSPITAL_COMMUNITY)
Admission: EM | Admit: 2019-07-13 | Discharge: 2019-07-13 | Disposition: A | Discharge status: Home / Self Care | Payer: BC Managed Care – PPO | Attending: Family Medicine | Admitting: Family Medicine

## 2019-07-13 DIAGNOSIS — D869 Sarcoidosis, unspecified: Secondary | ICD-10-CM | POA: Diagnosis not present

## 2019-07-13 DIAGNOSIS — N182 Chronic kidney disease, stage 2 (mild): Secondary | ICD-10-CM | POA: Insufficient documentation

## 2019-07-13 DIAGNOSIS — R059 Cough, unspecified: Secondary | ICD-10-CM

## 2019-07-13 DIAGNOSIS — R05 Cough: Secondary | ICD-10-CM | POA: Diagnosis not present

## 2019-07-13 DIAGNOSIS — Z87891 Personal history of nicotine dependence: Secondary | ICD-10-CM | POA: Insufficient documentation

## 2019-07-13 DIAGNOSIS — K219 Gastro-esophageal reflux disease without esophagitis: Secondary | ICD-10-CM | POA: Insufficient documentation

## 2019-07-13 DIAGNOSIS — R509 Fever, unspecified: Secondary | ICD-10-CM | POA: Diagnosis not present

## 2019-07-13 DIAGNOSIS — R5383 Other fatigue: Secondary | ICD-10-CM | POA: Diagnosis not present

## 2019-07-13 DIAGNOSIS — R0982 Postnasal drip: Secondary | ICD-10-CM | POA: Diagnosis not present

## 2019-07-13 DIAGNOSIS — I129 Hypertensive chronic kidney disease with stage 1 through stage 4 chronic kidney disease, or unspecified chronic kidney disease: Secondary | ICD-10-CM | POA: Diagnosis not present

## 2019-07-13 DIAGNOSIS — Z20822 Contact with and (suspected) exposure to covid-19: Secondary | ICD-10-CM | POA: Diagnosis not present

## 2019-07-13 DIAGNOSIS — D860 Sarcoidosis of lung: Secondary | ICD-10-CM | POA: Insufficient documentation

## 2019-07-13 MED ORDER — HYDROCODONE-HOMATROPINE 5-1.5 MG/5ML PO SYRP: 5.0000 mL | ORAL_SOLUTION | Freq: Four times a day (QID) | ORAL | 0 refills | Status: DC | PRN

## 2019-07-13 MED ORDER — ACETAMINOPHEN 325 MG PO TABS
975.0000 mg | ORAL_TABLET | Freq: Once | ORAL | Status: AC
  Administered 2019-07-13: 975 mg via ORAL

## 2019-07-13 MED ORDER — ACETAMINOPHEN 325 MG PO TABS
ORAL_TABLET | ORAL | Status: AC
  Filled 2019-07-13: qty 2

## 2019-07-13 NOTE — ED Triage Notes (Signed)
Patient has post nasal drip, patient is complaining of chills.  Patient is coughing frequently.  Speaking in phrases.  Symptoms started 3 days ago.

## 2019-07-13 NOTE — ED Notes (Signed)
Notified dr hagler on arrival to treatment room

## 2019-07-14 NOTE — ED Provider Notes (Signed)
Sean Sharp   [id number] 07/13/19 Arrival Time: 1924  ASSESSMENT & PLAN:  1. Sarcoidosis   2. Cough   3. Fever, unspecified fever cause     Question viral trigger; discussed. COVID testing sent. Also discussed workup that I could perform here. Discussed CXR but I doubt this would show anything given symptoms only for 2-3 days. No concern for pneumothorax. Do not suspect cardiac cause of current symptoms. He is comfortable starting prednisone 60m tonight. Will call his doctor in the morning for further instructions. He is certainly welcome to see me again tomorrow to assess response to prednisone.   Meds ordered this encounter  Medications  . HYDROcodone-homatropine (HYCODAN) 5-1.5 MG/5ML syrup    Sig: Take 5 mLs by mouth every 6 (six) hours as needed for cough.    Dispense:  90 mL    Refill:  0  . acetaminophen (TYLENOL) tablet 975 mg    Agrees to proceed to the ED tonight should his symptoms worsen. Tylenol for fever reduction.  Reviewed expectations re: course of current medical issues. Questions answered. Outlined signs and symptoms indicating need for more acute intervention. Patient verbalized understanding. After Visit Summary given.   SUBJECTIVE:  History from: patient. [name] a 44y.o. 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 Gradual onset; first noted approx 3 d ago. Subjective fever/chills. Reports episodes in the past with these very same symptoms including fever that respond to prednisone. He has prednisone at home but has not taken yet. No associated CP or back pain reported. Has been feeling more fatigued lately. Normal PO intake without n/v/d. Current symptoms are affecting sleep. He is ambulatory without assistance. No LE edema reported. Denies: irregular heart beat, lower extremity edema, near-syncope, orthopnea, palpitations, paroxysmal  nocturnal dyspnea and syncope. No specific aggravating or alleviating factors reported. Recent illnesses: none. Self/OTC treatment: none reported. Illicit drug use: none.  Social History   Tobacco Use  Smoking Status Former Smoker  . Packs/day: 0.30  . Years: 1.50  . Pack years: 0.45  . Types: Cigarettes  . Quit date: 06/10/1995  . Years since quitting: 24.1  Smokeless Tobacco Never Used   Social History   Substance and Sexual Activity  Alcohol Use Yes   Comment: occasional     OBJECTIVE:  Vitals:   07/13/19 2000 07/13/19 2009  BP:  (!) 159/80  Pulse: (!) 125   Resp: (!) 32   Temp: (!) 103.3 F (39.6 C)   TempSrc: Oral   SpO2: 95%     Abnormal vitals noted. He is able to speak in short sentences while seated. General appearance: alert Eyes: conjunctivae normal HENT: normocephalic; atraumatic Neck: has FROM Lungs: with labored respirations; CTAB without wheezing or rales Heart: regular; tachycardic Abdomen: soft Extremities: without edema Skin: warm and dry; without rash or lesions Neuro: normal gait Psychological: alert and cooperative; normal mood and affect  Labs Reviewed: Results for orders placed or performed during the hospital encounter of 093/81/01 Basic Metabolic Panel (BMET)  Result Value Ref Range   Sodium 136 135 - 145 mmol/L   Potassium 4.9 3.5 - 5.1 mmol/L   Chloride 105 98 - 111 mmol/L   CO2 22 22 - 32 mmol/L   Glucose, Bld 114 (H) 70 - 99 mg/dL   BUN 19 6 - 20 mg/dL   Creatinine, Ser 1.35 (H) 0.61 - 1.24 mg/dL   Calcium 9.3 8.9 - 10.3 mg/dL  GFR calc non Af Amer >60 >60 mL/min   GFR calc Af Amer >60 >60 mL/min   Anion gap 9 5 - 15   Labs Reviewed  NOVEL CORONAVIRUS, NAA (HOSP ORDER, SEND-OUT TO REF LAB; TAT 18-24 HRS)      Allergies  Allergen Reactions  . Bee Venom Swelling    Past Medical History:  Diagnosis Date  . Allergic rhinitis   . 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  Hemoglobin electrophoresis normal 2016.  Marland Kitchen  Chronic renal insufficiency, stage II (mild)   . Cutaneous sarcoidosis    [place] rheum: as of 02/2019->plaquenil.  Cont plaquenil and slowly tapering prednisone as of 06/2019 rheum f/u  . Hoarseness 2015   [facility] 04/2014-normal laryngoscopy: prednisone taper and bid nexium recommended.  No signif help so pt got 2nd opinion at [facility]   . HTN (hypertension)   . Hydrocele    left  . LPRD (laryngopharyngeal reflux disease)   . Obesity, Class II, BMI 35-39.9   . Olecranon bursitis of right elbow 04/29/2018   persistent/recurrent s/p aspiration-->bursa excision   . Peripheral edema    left leg>right leg  . Prediabetes 02/2017   A1c 6.3% (right when he was starting to take prednisone prn for treatment of his sarcoidosis).  HbA1c 08/2017=6.3%.  . Pulmonary arterial hypertension (HCC)    Mild (R heart cath 06/27/19), echo 06/2019 normal.  Wt loss and CPAP recommended  . 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 Melvyn Novas suspects it was smoldering since 2011.  Skin bx of cutaneous lesion confirmed dx of sarcoidosis 02/2017.  Pt improving fall 2018 with plaquenil and prednisone.  Pt self d/c'd plaquenil, takes pred prn per his own judgement, pt commonly takes meds incorrectly/not as recommended by MDs.  . Vocal cord polyps 2015   surgery [facility]    Social History   Socioeconomic History  . Marital status: Married    Spouse name: Not on file  . Number of children: 2  . Years of education: Not on file  . Highest education level: Not on file  Occupational History  . Occupation: Clinical biochemist  Tobacco Use  . Smoking status: Former Smoker    Packs/day: 0.30    Years: 1.50    Pack years: 0.45    Types: Cigarettes    Quit date: 06/10/1995    Years since quitting: 24.1  . Smokeless tobacco: Never Used  Substance and Sexual Activity  . Alcohol use: Yes    Comment: occasional  . Drug use: No  . Sexual activity: Not on file  Other Topics Concern  . Not on file  Social History Narrative   Married,  2 children (2 and 72 y/o).   Occupation: Clinical biochemist Editor, commissioning).   Orig from [employer].   No Tobacco.  Rare alcohol.  No drugs.   Enjoys fishing, Environmental health practitioner, wood working, Research officer, trade union.      Social Determinants of Health   Financial Resource Strain:   . Difficulty of Paying Living Expenses: Not on file  Food Insecurity:   . Worried About Charity fundraiser in the Last Year: Not on file  . Ran Out of Food in the Last Year: Not on file  Transportation Needs:   . Lack of Transportation (Medical): Not on file  . Lack of Transportation (Non-Medical): Not on file  Physical Activity:   . Days of Exercise per Week: Not on file  . Minutes of Exercise per Session: Not  on file  Stress:   . Feeling of Stress : Not on file  Social Connections:   . Frequency of Communication with Friends and Family: Not on file  . Frequency of Social Gatherings with Friends and Family: Not on file  . Attends Religious Services: Not on file  . Active Member of Clubs or Organizations: Not on file  . Attends Archivist Meetings: Not on file  . Marital Status: Not on file  Intimate Partner Violence:   . Fear of Current or Ex-Partner: Not on file  . Emotionally Abused: Not on file  . Physically Abused: Not on file  . Sexually Abused: Not on file   Family History  Problem Relation Age of Onset  . Sarcoidosis Father        ?  Marland Kitchen Lactose intolerance Father   . Other Mother        colon issues-portion of colon removed  . Lactose intolerance Mother   . Cancer Paternal Grandfather        ?   Past Surgical History:  Procedure Laterality Date  . OLECRANON BURSECTOMY Right 2019  . RIGHT HEART CATH N/A 06/27/2019   Mild PAH. Procedure: RIGHT HEART CATH;  Surgeon: Jolaine Artist, MD;  Location: [place] [facility];  Service: Cardiovascular;  Laterality: N/A;  . 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  . SKIN GRAFT  2010   on left index finger  .  TRANSTHORACIC ECHOCARDIOGRAM  06/27/2019   NORMAL  . Vocal cord surgery  2016   [facility]  . WISDOM TOOTH EXTRACTION  2005     Vanessa Kick, MD 07/14/19 [phone]

## 2019-07-16 ENCOUNTER — Other Ambulatory Visit: Payer: Self-pay

## 2019-07-16 ENCOUNTER — Encounter (HOSPITAL_COMMUNITY): Payer: Self-pay | Admitting: Emergency Medicine

## 2019-07-16 ENCOUNTER — Inpatient Hospital Stay (HOSPITAL_COMMUNITY)
Admission: EM | Admit: 2019-07-16 | Discharge: 2019-07-21 | DRG: 871 | Disposition: A | Discharge status: Home / Self Care | Payer: BC Managed Care – PPO | Attending: Internal Medicine | Admitting: Internal Medicine

## 2019-07-16 ENCOUNTER — Emergency Department (HOSPITAL_COMMUNITY): Payer: BC Managed Care – PPO

## 2019-07-16 DIAGNOSIS — N182 Chronic kidney disease, stage 2 (mild): Secondary | ICD-10-CM | POA: Diagnosis present

## 2019-07-16 DIAGNOSIS — J310 Chronic rhinitis: Secondary | ICD-10-CM | POA: Diagnosis present

## 2019-07-16 DIAGNOSIS — Z6839 Body mass index (BMI) 39.0-39.9, adult: Secondary | ICD-10-CM

## 2019-07-16 DIAGNOSIS — J309 Allergic rhinitis, unspecified: Secondary | ICD-10-CM | POA: Diagnosis present

## 2019-07-16 DIAGNOSIS — J984 Other disorders of lung: Secondary | ICD-10-CM

## 2019-07-16 DIAGNOSIS — D869 Sarcoidosis, unspecified: Secondary | ICD-10-CM | POA: Diagnosis not present

## 2019-07-16 DIAGNOSIS — J189 Pneumonia, unspecified organism: Secondary | ICD-10-CM | POA: Diagnosis present

## 2019-07-16 DIAGNOSIS — D563 Thalassemia minor: Secondary | ICD-10-CM | POA: Diagnosis not present

## 2019-07-16 DIAGNOSIS — E871 Hypo-osmolality and hyponatremia: Secondary | ICD-10-CM | POA: Diagnosis not present

## 2019-07-16 DIAGNOSIS — I4510 Unspecified right bundle-branch block: Secondary | ICD-10-CM | POA: Diagnosis present

## 2019-07-16 DIAGNOSIS — Z7952 Long term (current) use of systemic steroids: Secondary | ICD-10-CM

## 2019-07-16 DIAGNOSIS — Z20822 Contact with and (suspected) exposure to covid-19: Secondary | ICD-10-CM | POA: Diagnosis not present

## 2019-07-16 DIAGNOSIS — Z87891 Personal history of nicotine dependence: Secondary | ICD-10-CM | POA: Diagnosis not present

## 2019-07-16 DIAGNOSIS — R6520 Severe sepsis without septic shock: Secondary | ICD-10-CM | POA: Diagnosis not present

## 2019-07-16 DIAGNOSIS — I2721 Secondary pulmonary arterial hypertension: Secondary | ICD-10-CM | POA: Diagnosis present

## 2019-07-16 DIAGNOSIS — J9601 Acute respiratory failure with hypoxia: Secondary | ICD-10-CM | POA: Diagnosis present

## 2019-07-16 DIAGNOSIS — I129 Hypertensive chronic kidney disease with stage 1 through stage 4 chronic kidney disease, or unspecified chronic kidney disease: Secondary | ICD-10-CM | POA: Diagnosis not present

## 2019-07-16 DIAGNOSIS — Z79899 Other long term (current) drug therapy: Secondary | ICD-10-CM | POA: Diagnosis not present

## 2019-07-16 DIAGNOSIS — R05 Cough: Secondary | ICD-10-CM | POA: Diagnosis not present

## 2019-07-16 DIAGNOSIS — N179 Acute kidney failure, unspecified: Secondary | ICD-10-CM | POA: Diagnosis present

## 2019-07-16 DIAGNOSIS — Z809 Family history of malignant neoplasm, unspecified: Secondary | ICD-10-CM | POA: Diagnosis not present

## 2019-07-16 DIAGNOSIS — K219 Gastro-esophageal reflux disease without esophagitis: Secondary | ICD-10-CM | POA: Diagnosis not present

## 2019-07-16 DIAGNOSIS — Z91030 Bee allergy status: Secondary | ICD-10-CM

## 2019-07-16 DIAGNOSIS — R000 Tachycardia, unspecified: Secondary | ICD-10-CM | POA: Diagnosis not present

## 2019-07-16 DIAGNOSIS — R0602 Shortness of breath: Secondary | ICD-10-CM | POA: Diagnosis not present

## 2019-07-16 DIAGNOSIS — I10 Essential (primary) hypertension: Secondary | ICD-10-CM | POA: Diagnosis not present

## 2019-07-16 DIAGNOSIS — R059 Cough, unspecified: Secondary | ICD-10-CM | POA: Diagnosis present

## 2019-07-16 DIAGNOSIS — R509 Fever, unspecified: Secondary | ICD-10-CM | POA: Diagnosis not present

## 2019-07-16 DIAGNOSIS — D863 Sarcoidosis of skin: Secondary | ICD-10-CM | POA: Diagnosis present

## 2019-07-16 DIAGNOSIS — D860 Sarcoidosis of lung: Secondary | ICD-10-CM | POA: Diagnosis not present

## 2019-07-16 DIAGNOSIS — A419 Sepsis, unspecified organism: Principal | ICD-10-CM | POA: Diagnosis present

## 2019-07-16 LAB — COMPREHENSIVE METABOLIC PANEL
ALT: 28 U/L (ref 0–44)
AST: 35 U/L (ref 15–41)
Albumin: 3 g/dL — ABNORMAL LOW (ref 3.5–5.0)
Alkaline Phosphatase: 109 U/L (ref 38–126)
Anion gap: 14 (ref 5–15)
BUN: 19 mg/dL (ref 6–20)
CO2: 26 mmol/L (ref 22–32)
Calcium: 8.9 mg/dL (ref 8.9–10.3)
Chloride: 94 mmol/L — ABNORMAL LOW (ref 98–111)
Creatinine, Ser: 1.68 mg/dL — ABNORMAL HIGH (ref 0.61–1.24)
GFR calc Af Amer: 57 mL/min — ABNORMAL LOW (ref >60)
GFR calc non Af Amer: 49 mL/min — ABNORMAL LOW (ref >60)
Glucose, Bld: 128 mg/dL — ABNORMAL HIGH (ref 70–99)
Potassium: 4 mmol/L (ref 3.5–5.1)
Sodium: 134 mmol/L — ABNORMAL LOW (ref 135–145)
Total Bilirubin: 0.9 mg/dL (ref 0.3–1.2)
Total Protein: 8.1 g/dL (ref 6.5–8.1)

## 2019-07-16 LAB — LACTIC ACID, PLASMA
Lactic Acid, Venous: 2 mmol/L (ref 0.5–1.9)
Lactic Acid, Venous: 2.9 mmol/L (ref 0.5–1.9)
Lactic Acid, Venous: 2.5 mmol/L (ref 0.5–1.9)
Lactic Acid, Venous: 2.6 mmol/L (ref 0.5–1.9)
Lactic Acid, Venous: 2.3 mmol/L (ref 0.5–1.9)

## 2019-07-16 LAB — CBC WITH DIFFERENTIAL/PLATELET
Abs Immature Granulocytes: 1.69 10*3/uL — ABNORMAL HIGH (ref 0.00–0.07)
Basophils Absolute: 0 10*3/uL (ref 0.0–0.1)
Basophils Relative: 0 %
Eosinophils Absolute: 0 10*3/uL (ref 0.0–0.5)
Eosinophils Relative: 0 %
HCT: 45.3 % (ref 39.0–52.0)
Hemoglobin: 14.2 g/dL (ref 13.0–17.0)
Immature Granulocytes: 7 %
Lymphocytes Relative: 2 %
Lymphs Abs: 0.5 10*3/uL — ABNORMAL LOW (ref 0.7–4.0)
MCH: 21.7 pg — ABNORMAL LOW (ref 26.0–34.0)
MCHC: 31.3 g/dL (ref 30.0–36.0)
MCV: 69.3 fL — ABNORMAL LOW (ref 80.0–100.0)
Monocytes Absolute: 1.4 10*3/uL — ABNORMAL HIGH (ref 0.1–1.0)
Monocytes Relative: 6 %
Neutro Abs: 20 10*3/uL — ABNORMAL HIGH (ref 1.7–7.7)
Neutrophils Relative %: 85 %
Platelets: 227 10*3/uL (ref 150–400)
RBC: 6.54 MIL/uL — ABNORMAL HIGH (ref 4.22–5.81)
RDW: 18.7 % — ABNORMAL HIGH (ref 11.5–15.5)
WBC: 23.6 10*3/uL — ABNORMAL HIGH (ref 4.0–10.5)
nRBC: 0.1 % (ref 0.0–0.2)

## 2019-07-16 LAB — URINALYSIS, ROUTINE W REFLEX MICROSCOPIC
Bacteria, UA: NONE SEEN
Bilirubin Urine: NEGATIVE
Glucose, UA: NEGATIVE mg/dL
Hgb urine dipstick: NEGATIVE
Ketones, ur: NEGATIVE mg/dL
Leukocytes,Ua: NEGATIVE
Nitrite: NEGATIVE
Protein, ur: 100 mg/dL — AB
Specific Gravity, Urine: 1.026 (ref 1.005–1.030)
pH: 5 (ref 5.0–8.0)

## 2019-07-16 LAB — PROCALCITONIN: Procalcitonin: 2.61 ng/mL

## 2019-07-16 LAB — NOVEL CORONAVIRUS, NAA (HOSP ORDER, SEND-OUT TO REF LAB; TAT 18-24 HRS): SARS-CoV-2, NAA: NOT DETECTED

## 2019-07-16 LAB — CBC
HCT: 40.3 % (ref 39.0–52.0)
Hemoglobin: 12.6 g/dL — ABNORMAL LOW (ref 13.0–17.0)
MCH: 21.8 pg — ABNORMAL LOW (ref 26.0–34.0)
MCHC: 31.3 g/dL (ref 30.0–36.0)
MCV: 69.6 fL — ABNORMAL LOW (ref 80.0–100.0)
Platelets: 199 10*3/uL (ref 150–400)
RBC: 5.79 MIL/uL (ref 4.22–5.81)
RDW: 17.6 % — ABNORMAL HIGH (ref 11.5–15.5)
WBC: 19.2 10*3/uL — ABNORMAL HIGH (ref 4.0–10.5)
nRBC: 0.1 % (ref 0.0–0.2)

## 2019-07-16 LAB — D-DIMER, QUANTITATIVE: D-Dimer, Quant: 3.75 ug/mL-FEU — ABNORMAL HIGH (ref 0.00–0.50)

## 2019-07-16 LAB — PROTIME-INR
INR: 1.2 (ref 0.8–1.2)
Prothrombin Time: 15.2 seconds (ref 11.4–15.2)

## 2019-07-16 LAB — RESPIRATORY PANEL BY RT PCR (FLU A&B, COVID)
Influenza A by PCR: NEGATIVE
Influenza B by PCR: NEGATIVE
SARS Coronavirus 2 by RT PCR: NEGATIVE

## 2019-07-16 LAB — MRSA PCR SCREENING: MRSA by PCR: NEGATIVE

## 2019-07-16 LAB — TRIGLYCERIDES: Triglycerides: 258 mg/dL — ABNORMAL HIGH (ref <150)

## 2019-07-16 LAB — LACTATE DEHYDROGENASE: LDH: 416 U/L — ABNORMAL HIGH (ref 98–192)

## 2019-07-16 LAB — APTT: aPTT: 29 seconds (ref 24–36)

## 2019-07-16 LAB — FERRITIN: Ferritin: 466 ng/mL — ABNORMAL HIGH (ref 24–336)

## 2019-07-16 LAB — FIBRINOGEN: Fibrinogen: >800 mg/dL — ABNORMAL HIGH (ref 210–475)

## 2019-07-16 LAB — C-REACTIVE PROTEIN: CRP: 46.1 mg/dL — ABNORMAL HIGH (ref <1.0)

## 2019-07-16 MED ORDER — PREDNISONE 5 MG PO TABS
30.0000 mg | ORAL_TABLET | Freq: Every day | ORAL | Status: DC
  Administered 2019-07-16 – 2019-07-21 (×6): 30 mg via ORAL
  Filled 2019-07-16 (×6): qty 1

## 2019-07-16 MED ORDER — LACTATED RINGERS IV BOLUS
1000.0000 mL | Freq: Once | INTRAVENOUS | Status: AC
  Administered 2019-07-16: 1000 mL via INTRAVENOUS

## 2019-07-16 MED ORDER — SODIUM CHLORIDE 0.9 % IV SOLN
2.0000 g | INTRAVENOUS | Status: DC
  Administered 2019-07-16: 2 g via INTRAVENOUS
  Filled 2019-07-16: qty 20

## 2019-07-16 MED ORDER — PIPERACILLIN-TAZOBACTAM 3.375 G IVPB
3.3750 g | Freq: Three times a day (TID) | INTRAVENOUS | Status: DC
  Administered 2019-07-16 – 2019-07-19 (×9): 3.375 g via INTRAVENOUS
  Filled 2019-07-16 (×9): qty 50

## 2019-07-16 MED ORDER — AMLODIPINE BESYLATE 10 MG PO TABS
10.0000 mg | ORAL_TABLET | Freq: Every day | ORAL | Status: DC
  Administered 2019-07-16 – 2019-07-21 (×6): 10 mg via ORAL
  Filled 2019-07-16 (×6): qty 1

## 2019-07-16 MED ORDER — VANCOMYCIN HCL 2000 MG/400ML IV SOLN
2000.0000 mg | Freq: Once | INTRAVENOUS | Status: AC
  Administered 2019-07-16: 2000 mg via INTRAVENOUS
  Filled 2019-07-16: qty 400

## 2019-07-16 MED ORDER — ALBUTEROL SULFATE HFA 108 (90 BASE) MCG/ACT IN AERS
6.0000 | INHALATION_SPRAY | Freq: Once | RESPIRATORY_TRACT | Status: AC
  Administered 2019-07-16: 6 via RESPIRATORY_TRACT
  Filled 2019-07-16: qty 6.7

## 2019-07-16 MED ORDER — PIPERACILLIN-TAZOBACTAM 4.5 G IVPB: 4.5000 g | Freq: Four times a day (QID) | INTRAVENOUS | Status: DC

## 2019-07-16 MED ORDER — ACETAMINOPHEN 325 MG PO TABS
650.0000 mg | ORAL_TABLET | Freq: Once | ORAL | Status: AC
  Administered 2019-07-16: 650 mg via ORAL
  Filled 2019-07-16: qty 2

## 2019-07-16 MED ORDER — SODIUM CHLORIDE (PF) 0.9 % IJ SOLN
INTRAMUSCULAR | Status: AC
  Filled 2019-07-16: qty 50

## 2019-07-16 MED ORDER — VANCOMYCIN HCL IN DEXTROSE 1-5 GM/200ML-% IV SOLN
1000.0000 mg | Freq: Two times a day (BID) | INTRAVENOUS | Status: DC
  Administered 2019-07-17: 1000 mg via INTRAVENOUS
  Filled 2019-07-16: qty 200

## 2019-07-16 MED ORDER — SODIUM CHLORIDE 0.9 % IV SOLN
500.0000 mg | INTRAVENOUS | Status: DC
  Administered 2019-07-16 – 2019-07-19 (×4): 500 mg via INTRAVENOUS
  Filled 2019-07-16 (×4): qty 500

## 2019-07-16 MED ORDER — ACETAMINOPHEN 500 MG PO TABS
1000.0000 mg | ORAL_TABLET | Freq: Three times a day (TID) | ORAL | Status: DC | PRN
  Administered 2019-07-16 – 2019-07-19 (×4): 1000 mg via ORAL
  Filled 2019-07-16 (×4): qty 2

## 2019-07-16 MED ORDER — IOHEXOL 350 MG/ML SOLN
80.0000 mL | Freq: Once | INTRAVENOUS | Status: AC | PRN
  Administered 2019-07-16: 80 mL via INTRAVENOUS

## 2019-07-16 MED ORDER — LACTATED RINGERS IV BOLUS (SEPSIS)
1000.0000 mL | Freq: Once | INTRAVENOUS | Status: AC
  Administered 2019-07-16: 1000 mL via INTRAVENOUS

## 2019-07-16 MED ORDER — METOPROLOL SUCCINATE ER 25 MG PO TB24
25.0000 mg | ORAL_TABLET | Freq: Every day | ORAL | Status: DC
  Administered 2019-07-16 – 2019-07-19 (×4): 25 mg via ORAL
  Filled 2019-07-16 (×4): qty 1

## 2019-07-16 MED ORDER — ENOXAPARIN SODIUM 80 MG/0.8ML ~~LOC~~ SOLN
70.0000 mg | SUBCUTANEOUS | Status: DC
  Administered 2019-07-16 – 2019-07-20 (×4): 70 mg via SUBCUTANEOUS
  Filled 2019-07-16 (×5): qty 0.8

## 2019-07-16 MED ORDER — PANTOPRAZOLE SODIUM 40 MG PO TBEC
40.0000 mg | DELAYED_RELEASE_TABLET | Freq: Every day | ORAL | Status: DC
  Administered 2019-07-17 – 2019-07-20 (×4): 40 mg via ORAL
  Filled 2019-07-16 (×4): qty 1

## 2019-07-16 MED ORDER — HYDROXYCHLOROQUINE SULFATE 200 MG PO TABS
400.0000 mg | ORAL_TABLET | Freq: Every day | ORAL | Status: DC
  Administered 2019-07-16 – 2019-07-21 (×6): 400 mg via ORAL
  Filled 2019-07-16 (×6): qty 2

## 2019-07-16 MED ORDER — TRAZODONE HCL 50 MG PO TABS
50.0000 mg | ORAL_TABLET | Freq: Every evening | ORAL | Status: DC | PRN
  Administered 2019-07-20: 50 mg via ORAL
  Filled 2019-07-16: qty 1

## 2019-07-16 MED ORDER — LACTATED RINGERS IV BOLUS (SEPSIS)
600.0000 mL | Freq: Once | INTRAVENOUS | Status: AC
  Administered 2019-07-16: 600 mL via INTRAVENOUS

## 2019-07-16 MED ORDER — HYDROCODONE-HOMATROPINE 5-1.5 MG/5ML PO SYRP
5.0000 mL | ORAL_SOLUTION | Freq: Four times a day (QID) | ORAL | Status: DC | PRN
  Administered 2019-07-16 – 2019-07-21 (×12): 5 mL via ORAL
  Filled 2019-07-16 (×12): qty 5

## 2019-07-16 NOTE — ED Triage Notes (Signed)
Patient complaining of fever x7 days, patient thinks he has PNA, has had PNA before. Took tylenol at 0600 this AM, states his fever has been hard to break. Coughing and complains of flem upon presentation.

## 2019-07-16 NOTE — Progress Notes (Signed)
CRITICAL VALUE ALERT  Critical Value:  Lactic acid 2.6  Date & Time Notied:  07/16/2019  1925  Provider Notified: Jeannette Corpus NP  Orders Received/Actions taken: awaiting orders, nightshift RN Sharyn Lull made aware.

## 2019-07-16 NOTE — Progress Notes (Signed)
Notified bedside nurse of need to draw repeat lactic acid in 2 hours since it is increasing.

## 2019-07-16 NOTE — H&P (Addendum)
[facility] History and Physical  Sean Sharp RUE:[id number] DOB: Jun 13, 1975 DOA: 07/16/2019  Referring provider: Shelby Dubin, PA PCP: Tammi Sou, MD   Chief Complaint: SOB, cough, fever  HPI: Sean Sharp is a 44 y.o. male with PMH HTN, sarcoidosis, pulmonary HTN, obesity who presented to ED with fever, cough and SOB and found to have sepsis secondary to CAP/cavitary pneumonia. Patient reports he was diagnosed with sarcoidosis about 8 years ago and has been on and off steroids since that time. Currently on a steroid taper. He also reports long history of rhinitis and post-nasal drip that he had for many years and finally resolved with Zyrtec this past April. Six days ago he noted return of rhinitis and this progressed into worsening cough, SOB, fever, chills, diarrhea. He was seen at Urgent Care two days ago and tested negative for COVID and supportive care advised. Symptoms continued to progress. Reports poor PO intake for past 2-3 days. Denies abdominal pain, nausea, vomiting, constipation, dysuria, hematuria, hematochezia, melena, difficulty moving arms/legs, speech difficulty, confusion or any other complaints.  In the ED: Vitals remarkable for elevated BP, tachycardia, tachypnea requiring 3L O2 and febrile. Initial Lactate 2.0. Ferritin 466, CRP 46, Na 134, Cr 1.68, WBC 23.6, Hgb 14.2, Plt 227, D-dimer 3.75, Procal 2.61, LDH 416, Fibrinogen > 800, COVID negative. Patient given Tylenol, Albuterol inhaler, Azithromycin, Rocephin and 2.6 L LR. Repeat Lactate 2.9. CXR: Acute RIGHT UPPER LOBE atelectasis and/or pneumonia superimposed upon chronic findings of sarcoidosis. CTA: Concerning for cavitary PNA, multifocal PNA.  EKG: HR 110. Sinus tachycardia. Known RBBB when compared to prior. No STEMI.   Review of Systems:  Constitutional:  Positive for Fevers, chills, fatigue. Denies weight loss.  HEENT:  No headaches, Difficulty swallowing,Tooth/dental problems, Sore  throat,  No sneezing, itching, ear ache. Positive for nasal congestion, post nasal drip. Cardio-vascular:  Chest pain with coughing. Negative for Orthopnea, PND, swelling in lower extremities, anasarca, dizziness, palpitations  GI:  No heartburn, indigestion, abdominal pain, nausea, vomiting. Reports loss of appetite and intermittent diarrhea. Resp:  Positive for shortness of breath with exertion and rest. Productive cough. No coughing up of blood. No wheezing. No chest wall deformity  Skin:  no rash or lesions.  GU:  no dysuria, change in color of urine, no urgency or frequency. No flank pain.  Musculoskeletal:  No joint pain or swelling. No decreased range of motion. No back pain.  Psych:  No change in mood or affect. No depression or anxiety. No memory loss.   Past Medical History:  Diagnosis Date  . Allergic rhinitis   . 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  Hemoglobin electrophoresis normal 2016.  Marland Kitchen Chronic renal insufficiency, stage II (mild)   . Cutaneous sarcoidosis    [place] rheum: as of 02/2019->plaquenil.  Cont plaquenil and slowly tapering prednisone as of 06/2019 rheum f/u  . Hoarseness 2015   [facility] 04/2014-normal laryngoscopy: prednisone taper and bid nexium recommended.  No signif help so pt got 2nd opinion at [facility]   . HTN (hypertension)   . Hydrocele    left  . LPRD (laryngopharyngeal reflux disease)   . Obesity, Class II, BMI 35-39.9   . Olecranon bursitis of right elbow 04/29/2018   persistent/recurrent s/p aspiration-->bursa excision   . Peripheral edema    left leg>right leg  . Prediabetes 02/2017   A1c 6.3% (right when he was starting to take prednisone prn for treatment of his sarcoidosis).  HbA1c 08/2017=6.3%.  . Pulmonary arterial  hypertension ([place])    Mild (R heart cath 06/27/19), echo 06/2019 normal.  Wt loss and CPAP recommended  . 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 Melvyn Novas suspects it was smoldering since 2011.  Skin bx of cutaneous  lesion confirmed dx of sarcoidosis 02/2017.  Pt improving fall 2018 with plaquenil and prednisone.  Pt self d/c'd plaquenil, takes pred prn per his own judgement, pt commonly takes meds incorrectly/not as recommended by MDs.  . Vocal cord polyps 2015   surgery [facility]    Past Surgical History:  Procedure Laterality Date  . OLECRANON BURSECTOMY Right 2019  . RIGHT HEART CATH N/A 06/27/2019   Mild PAH. Procedure: RIGHT HEART CATH;  Surgeon: Jolaine Artist, MD;  Location: [place] [facility];  Service: Cardiovascular;  Laterality: N/A;  . 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  . SKIN GRAFT  2010   on left index finger  . TRANSTHORACIC ECHOCARDIOGRAM  06/27/2019   NORMAL  . Vocal cord surgery  2016   [facility]  . WISDOM TOOTH EXTRACTION  2005   Social History:  reports that he quit smoking about 24 years ago. His smoking use included cigarettes. He has a 0.45 pack-year smoking history. He has never used smokeless tobacco. He reports current alcohol use. He reports that he does not use drugs.  Allergies  Allergen Reactions  . Bee Venom Swelling    Family History  Problem Relation Age of Onset  . Sarcoidosis Father        ?  Marland Kitchen Lactose intolerance Father   . Other Mother        colon issues-portion of colon removed  . Lactose intolerance Mother   . Cancer Paternal Grandfather        ?    Prior to Admission medications   Medication Sig Start Date End Date Taking? Authorizing Provider  amLODipine (NORVASC) 10 MG tablet TAKE 1 TABLET BY MOUTH EVERY DAY Patient taking differently: Take 10 mg by mouth daily.  04/08/19  Yes McGowen, Adrian Blackwater, MD  Clocortolone Pivalate (CLODERM) 0.1 % cream Apply 1 application topically 2 (two) times daily as needed (Sarcoidosis/rash).  03/24/19  Yes [provider]  HYDROcodone-homatropine (HYCODAN) 5-1.5 MG/5ML syrup Take 5 mLs by mouth every 6 (six) hours as needed for cough. 07/13/19  Yes Hagler, Aaron Edelman, MD    hydrocortisone butyrate (LUCOID) 0.1 % CREA cream Apply 1 application topically 2 (two) times daily. Patient taking differently: Apply 1 application topically 2 (two) times daily as needed (Sarcoidosis).  09/04/17  Yes McGowen, Adrian Blackwater, MD  hydroxychloroquine (PLAQUENIL) 200 MG tablet Take 2 daily Patient taking differently: Take 400 mg by mouth daily.  09/06/18  Yes Tanda Rockers, MD  metoprolol succinate (TOPROL-XL) 25 MG 24 hr tablet TAKE 1 TABLET BY MOUTH EVERY DAY Patient taking differently: Take 25 mg by mouth daily.  02/08/19  Yes McGowen, Adrian Blackwater, MD  pantoprazole (PROTONIX) 40 MG tablet Take 30- 60 min before your first and last meals of the day Patient taking differently: Take 40 mg by mouth daily before breakfast. Take 30- 60 min before your first and last meals of the day 09/06/18  Yes Tanda Rockers, MD  predniSONE (DELTASONE) 10 MG tablet Take 10 mg by mouth daily with breakfast.   Yes [provider]  predniSONE (DELTASONE) 20 MG tablet Take 20 mg by mouth daily with breakfast.   Yes [provider]  spironolactone (ALDACTONE) 25 MG tablet Take 0.5 tablets (12.5 mg total) by mouth daily. 06/22/19  Yes Bensimhon, Shaune Pascal, MD  traZODone (DESYREL) 50 MG tablet 1-2 tabs po qhs prn insomnia Patient taking differently: Take 50-100 mg by mouth at bedtime as needed for sleep.  04/20/18  Yes Tammi Sou, MD   Physical Exam: Vitals:   07/16/19 1241 07/16/19 1330 07/16/19 1400 07/16/19 1500  BP:  (!) 183/90 (!) 195/91 (!) 168/81  Pulse: (!) 115 (!) 126 (!) 126 (!) 130  Resp: (!) 29 (!) 31 (!) 39 (!) 26  Temp:      TempSrc:      SpO2: 96% 95% 92% 91%  Weight:      Height:        Wt Readings from Last 3 Encounters:  07/16/19 (!) 138.3 kg  06/27/19 (!) 138.3 kg  06/22/19 (!) 140.3 kg    General:  Appears ill, increased work of breathing with speech Eyes: EOMI, normal lids, irises & conjunctiva ENT: grossly normal hearing Neck: no LAD, masses or  thyromegaly Cardiovascular: Tachycardic with regular rhythm, no m/r/g. No LE edema. Respiratory: Crackles noted in entire right lung and left mid-lower lung fields. Abdomen: soft, ntnd Skin: no rash or induration seen on limited exam Musculoskeletal: grossly normal tone BUE/BLE Psychiatric: grossly normal mood and affect, speech fluent and appropriate Neurologic: grossly non-focal.          Labs on Admission:  Basic Metabolic Panel: Recent Labs  Lab 07/16/19 1103  NA 134*  K 4.0  CL 94*  CO2 26  GLUCOSE 128*  BUN 19  CREATININE 1.68*  CALCIUM 8.9   Liver Function Tests: Recent Labs  Lab 07/16/19 1103  AST 35  ALT 28  ALKPHOS 109  BILITOT 0.9  PROT 8.1  ALBUMIN 3.0*   No results for input(s): LIPASE, AMYLASE in the last 168 hours. No results for input(s): AMMONIA in the last 168 hours. CBC: Recent Labs  Lab 07/16/19 1103  WBC 23.6*  NEUTROABS 20.0*  HGB 14.2  HCT 45.3  MCV 69.3*  PLT 227   Cardiac Enzymes: No results for input(s): CKTOTAL, CKMB, CKMBINDEX, TROPONINI in the last 168 hours.  BNP (last 3 results) No results for input(s): BNP in the last 8760 hours.  ProBNP (last 3 results) No results for input(s): PROBNP in the last 8760 hours.  CBG: No results for input(s): GLUCAP in the last 168 hours.  Radiological Exams on Admission: CT Angio Chest PE W/Cm &/Or Wo Cm  Result Date: 07/16/2019 CLINICAL DATA:  Shortness of breath. EXAM: CT ANGIOGRAPHY CHEST WITH CONTRAST TECHNIQUE: Multidetector CT imaging of the chest was performed using the standard protocol during bolus administration of intravenous contrast. Multiplanar CT image reconstructions and MIPs were obtained to evaluate the vascular anatomy. CONTRAST:  44m OMNIPAQUE IOHEXOL 350 MG/ML SOLN COMPARISON:  March 16, 2019. FINDINGS: Cardiovascular: Unfortunately, there is limited opacification of the pulmonary arteries. There is no evidence of large central pulmonary embolus in the main pulmonary  artery or main portions of the right and left pulmonary arteries. However, smaller emboli in the more peripheral branches of the pulmonary arteries cannot be excluded on the basis of this exam. Normal cardiac size. No pericardial effusion. Mediastinum/Nodes: Stable calcified adenopathy is noted consistent with history of sarcoidosis. The esophagus is unremarkable. Thyroid gland is unremarkable. Lungs/Pleura: No pneumothorax is noted. There is interval development of multiple rounded ill-defined opacities throughout both lungs concerning for multifocal pneumonia or septic emboli. The largest  measures 9.7 x 8.6 cm in the right lung apex. Cavitary abnormality measuring 5.0 x 4.3 cm is noted in the left lower lobe posteriorly most consistent with cavitary pneumonia. 1.9 cm cavitary abnormality is also noted in the left lower lobe. Upper Abdomen: No acute abnormality. Musculoskeletal: No chest wall abnormality. No acute or significant osseous findings. Review of the MIP images confirms the above findings. IMPRESSION: 1. Unfortunately, there is limited opacification of the pulmonary arteries. There is no evidence of large central pulmonary embolus in the main pulmonary artery or main portions of the right and left pulmonary arteries. However, smaller emboli in the more peripheral branches of the pulmonary arteries cannot be excluded on the basis of this exam. 2. Stable calcified adenopathy is noted consistent with history of sarcoidosis. 3. Interval development of multiple rounded ill-defined opacities throughout both lungs concerning for multifocal pneumonia or septic emboli. The largest measures 9.7 x 8.6 cm in the right lung apex. Cavitary abnormality measuring 5.0 x 4.3 cm is noted in the left lower lobe posteriorly most consistent with cavitary pneumonia. 1.9 cm cavitary abnormality is also noted in the left lower lobe. Electronically Signed   By: Marijo Conception M.D.   On: 07/16/2019 13:42   DG Chest Port 1  View  Result Date: 07/16/2019 CLINICAL DATA:  44 year old presenting with a 1 week history of fever and productive cough. Current history of sarcoidosis. EXAM: PORTABLE CHEST 1 VIEW COMPARISON:  Chest x-rays 02/25/2018 and earlier, CT chest 03/16/2019. FINDINGS: Since the most recent prior CT in October, 2020, development of dense consolidation and volume loss in the RIGHT UPPER LOBE. The micronodularity and chronic scarring related to sarcoidosis are less conspicuous on the chest x-ray. No new pulmonary parenchymal abnormalities elsewhere. Cardiac silhouette normal in size, unchanged. IMPRESSION: Acute RIGHT UPPER LOBE atelectasis and/or pneumonia superimposed upon chronic findings of sarcoidosis. Electronically Signed   By: Evangeline Dakin M.D.   On: 07/16/2019 12:09    EKG: Independently reviewed. HR 110. Sinus tachycardia. Known RBBB when compared to prior. No STEMI.   Assessment/Plan Principal Problem:   Sepsis due to pneumonia [facility]) Active Problems:   Allergic rhinitis   GERD   Cough   Essential hypertension   Sarcoidosis with skin and lung involvement    Morbid obesity due to excess calories ([place])   Cavitary pneumonia  44 y.o. male with PMH HTN, sarcoidosis, pulmonary HTN, obesity who presented to ED with fever, cough and SOB and found to have sepsis secondary to CAP/cavitary pneumonia.  1. Sepsis secondary to Pneumonia; multifocal and cavitary; present on admission - in setting of known sarcoidosis.  - describes 6 days of worsening cough, SOB, work of breathing, fever - New O2 requirement of 3L on admission, WBC elevated along with inflammatory markers  - Procal elevated - CTA suggesting cavitary PNA and multifocal - Given Ceftriaxone and Azithromycin in ED; Azithromycin continued  - Addition of Vanc and Zosyn due to acuity and PMH of sarcoidosis with likely cavitary/multifocal PNA; narrow as indicated - follow blood/sputum cx - Sepsis protocol; lactate repeat increased to 2.9;  another bolus and repeat lactate ordered on admission; Echo Jan 2021 with normal EF - Cont PTA Plaquenil  - high risk for decompensation considering sarcoidosis, obesity, OSA - Cr 1.68 on admission; baseline 1.3-1.4; monitor closely; not yet AKI - Cont PTA Prednisone; consider taper on discharge; higher risk for PCP due to long hx steroid use  2. HTN - Cont PTA Norvasc and Metoprolol - Spironolactone held  due to mild Cr bump and elevated lactate necessitating increased IVF; restart when able  BP Readings from Last 1 Encounters:  07/16/19 (!) 168/83   3. GERD - Cont PTA Protonix   4. 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 Patient acutely ill with sepsis on admission secondary to multifocal and cavitary PNA requiring IVF, IV abx. Due to co-morbidities, patient at high risk for decompensation. LOS estimated 4-5 days with eventual DC home.  Time spent: 70 minutes  Chauncey Mann, MD [facility] Pager [phone]

## 2019-07-16 NOTE — Plan of Care (Signed)

## 2019-07-16 NOTE — ED Notes (Signed)
Date and time results received: 07/16/19 12:27 PM  Test: Lactic acid Critical Value: 2.0  Name of Provider Notified: Britni PA  Orders Received? Or Actions Taken?: Awaiting further orders

## 2019-07-16 NOTE — Progress Notes (Signed)
   07/16/19 1806  Vitals  Temp (!) 102.6 F (39.2 C) (Dr. Marice Potter notified.)  Temp Source Oral  BP (!) 159/69  MAP (mmHg) 94  BP Location Right Arm  BP Method Automatic  Patient Position (if appropriate) Lying  Pulse Rate (!) 118  Resp (!) 28  Oxygen Therapy  SpO2 97 %  O2 Device Nasal Cannula  O2 Flow Rate (L/min) 3 L/min  Pain Assessment  Pain Scale 0-10  Pain Score 0  MEWS Score  MEWS Temp 2  MEWS Systolic 0  MEWS Pulse 2  MEWS RR 2  MEWS LOC 0  MEWS Score 6  MEWS Score Color Red  MEWS Assessment  Is this an acute change? Yes  MEWS guidelines implemented *See [place]  Provider Notification  Provider Name/Title Dr Marice Potter   Date Provider Notified 07/16/19  Time Provider Notified [phone]  Notification Type Page  Notification Reason Change in status;Other (Comment) (MEWS red)  Response See new orders  Date of Provider Response 07/16/19  Time of Provider Response 1810  Rapid Response Notification  Name of Rapid Response RN Notified Polly Cobia  Date Rapid Response Notified 07/16/19  Time Rapid Response Notified [id number]

## 2019-07-16 NOTE — ED Provider Notes (Signed)
[facility] DEPT Provider Note   CSN: [id number] Arrival date & time: 07/16/19  1033    History Chief Complaint  Patient presents with  . Fever   Sean Sharp is a 44 y.o. male with past medical history significant for obesity, hypertension, sarcoid, beta thalassemia minor, pulmonary hypertension who presents for evaluation of cough and fever.  Seen by urgent care 2 days ago.  He is on chronic steroids for his sarcoid.  Patient been running fever at home.  Temp at urgent care 2 days ago 103, patient tachycardic and tachypneic at that time.  Patient DC home with Hycodan, Covid test.  Negative Covid test at that time.  Patient states progressively worsening symptoms.  Denies chest pain, hemoptysis, abdominal pain, diarrhea, dysuria, PND, orthopnea, headache, congestion, rhinorrhea, lower extremity swelling, redness or warmth.  Has been taking his home medications.  Denies additional aggravating or alleviating factors.  Admits to SOB. No prior hx of PR, DVT.  History obtained from patient and past medical records.  No interpreter is used.  No recent TB exposure, incarceration, homelessness, IVDU.    HPI     Past Medical History:  Diagnosis Date  . Allergic rhinitis   . 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  Hemoglobin electrophoresis normal 2016.  Marland Kitchen Chronic renal insufficiency, stage II (mild)   . Cutaneous sarcoidosis    [place] rheum: as of 02/2019->plaquenil.  Cont plaquenil and slowly tapering prednisone as of 06/2019 rheum f/u  . Hoarseness 2015   [facility] 04/2014-normal laryngoscopy: prednisone taper and bid nexium recommended.  No signif help so pt got 2nd opinion at [facility]   . HTN (hypertension)   . Hydrocele    left  . LPRD (laryngopharyngeal reflux disease)   . Obesity, Class II, BMI 35-39.9   . Olecranon bursitis of right elbow 04/29/2018   persistent/recurrent s/p aspiration-->bursa excision   . Peripheral edema    left leg>right leg  .  Prediabetes 02/2017   A1c 6.3% (right when he was starting to take prednisone prn for treatment of his sarcoidosis).  HbA1c 08/2017=6.3%.  . Pulmonary arterial hypertension (HCC)    Mild (R heart cath 06/27/19), echo 06/2019 normal.  Wt loss and CPAP recommended  . 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 Melvyn Novas suspects it was smoldering since 2011.  Skin bx of cutaneous lesion confirmed dx of sarcoidosis 02/2017.  Pt improving fall 2018 with plaquenil and prednisone.  Pt self d/c'd plaquenil, takes pred prn per his own judgement, pt commonly takes meds incorrectly/not as recommended by MDs.  . Vocal cord polyps 2015   surgery [facility]     Patient Active Problem List   Diagnosis Date Noted  . Morbid obesity due to excess calories ([place]) 01/07/2017  . Olecranon bursitis of right elbow 01/06/2017  . Sarcoidosis with skin and lung involvement  12/12/2016  . Uncontrolled hypertension 01/26/2015  . Essential hypertension 06/01/2013  . Chronic venous insufficiency 06/01/2013  . Allergic rhinitis 05/17/2010  . GERD 05/17/2010  . Cough 04/12/2010    Past Surgical History:  Procedure Laterality Date  . OLECRANON BURSECTOMY Right 2019  . RIGHT HEART CATH N/A 06/27/2019   Mild PAH. Procedure: RIGHT HEART CATH;  Surgeon: Jolaine Artist, MD;  Location: [facility];  Service: Cardiovascular;  Laterality: N/A;  . 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  . SKIN GRAFT  2010   on left index  finger  . TRANSTHORACIC ECHOCARDIOGRAM  06/27/2019   NORMAL  . Vocal cord surgery  2016   [facility]  . WISDOM TOOTH EXTRACTION  2005       Family History  Problem Relation Age of Onset  . Sarcoidosis Father        ?  Marland Kitchen Lactose intolerance Father   . Other Mother        colon issues-portion of colon removed  . Lactose intolerance Mother   . Cancer Paternal Grandfather        ?    Social History   Tobacco Use  . Smoking status: Former Smoker    Packs/day:  0.30    Years: 1.50    Pack years: 0.45    Types: Cigarettes    Quit date: 06/10/1995    Years since quitting: 24.1  . Smokeless tobacco: Never Used  Substance Use Topics  . Alcohol use: Yes    Comment: occasional  . Drug use: No    Home Medications Prior to Admission medications   Medication Sig Start Date End Date Taking? Authorizing Provider  amLODipine (NORVASC) 10 MG tablet TAKE 1 TABLET BY MOUTH EVERY DAY Patient taking differently: Take 10 mg by mouth daily.  04/08/19  Yes McGowen, Adrian Blackwater, MD  Clocortolone Pivalate (CLODERM) 0.1 % cream Apply 1 application topically 2 (two) times daily as needed (Sarcoidosis/rash).  03/24/19  Yes [provider]  HYDROcodone-homatropine (HYCODAN) 5-1.5 MG/5ML syrup Take 5 mLs by mouth every 6 (six) hours as needed for cough. 07/13/19  Yes Hagler, Aaron Edelman, MD  hydrocortisone butyrate (LUCOID) 0.1 % CREA cream Apply 1 application topically 2 (two) times daily. Patient taking differently: Apply 1 application topically 2 (two) times daily as needed (Sarcoidosis).  09/04/17  Yes McGowen, Adrian Blackwater, MD  hydroxychloroquine (PLAQUENIL) 200 MG tablet Take 2 daily Patient taking differently: Take 400 mg by mouth daily.  09/06/18  Yes Tanda Rockers, MD  metoprolol succinate (TOPROL-XL) 25 MG 24 hr tablet TAKE 1 TABLET BY MOUTH EVERY DAY Patient taking differently: Take 25 mg by mouth daily.  02/08/19  Yes McGowen, Adrian Blackwater, MD  pantoprazole (PROTONIX) 40 MG tablet Take 30- 60 min before your first and last meals of the day Patient taking differently: Take 40 mg by mouth daily before breakfast. Take 30- 60 min before your first and last meals of the day 09/06/18  Yes Tanda Rockers, MD  predniSONE (DELTASONE) 10 MG tablet Take 10 mg by mouth daily with breakfast.   Yes [provider]  predniSONE (DELTASONE) 20 MG tablet Take 20 mg by mouth daily with breakfast.   Yes [provider]  spironolactone (ALDACTONE) 25 MG tablet Take 0.5  tablets (12.5 mg total) by mouth daily. 06/22/19  Yes Bensimhon, Shaune Pascal, MD  traZODone (DESYREL) 50 MG tablet 1-2 tabs po qhs prn insomnia Patient taking differently: Take 50-100 mg by mouth at bedtime as needed for sleep.  04/20/18  Yes McGowen, Adrian Blackwater, MD    Allergies    Bee venom  Review of Systems   Review of Systems  Constitutional: Positive for activity change, appetite change, chills and fever.  HENT: Negative.   Eyes: Negative.   Respiratory: Positive for cough, shortness of breath and wheezing.   Cardiovascular: Negative for chest pain, palpitations and leg swelling.  Gastrointestinal: Negative.   Genitourinary: Negative.   Musculoskeletal: Negative.   Skin: Negative.   Neurological: Positive for weakness (Generalized). Negative for dizziness, tremors, seizures, syncope,  facial asymmetry, speech difficulty, light-headedness, numbness and headaches.  All other systems reviewed and are negative.   Physical Exam Updated Vital Signs BP (!) 195/91   Pulse (!) 126   Temp (!) 100.5 F (38.1 C) (Oral)   Resp (!) 39   Ht _0  (1.88 m)   Wt (!) 138.3 kg   SpO2 92%   BMI 39.16 kg/m   Physical Exam Vitals and nursing note reviewed.  Constitutional:      General: He is not in acute distress.    Appearance: He is well-developed. He is obese. He is ill-appearing. He is not diaphoretic.  HENT:     Head: Normocephalic and atraumatic.     Nose: Nose normal.     Mouth/Throat:     Mouth: Mucous membranes are moist.  Eyes:     Pupils: Pupils are equal, round, and reactive to light.  Cardiovascular:     Rate and Rhythm: Regular rhythm. Tachycardia present.  Pulmonary:     Effort: Pulmonary effort is normal. No respiratory distress.     Comments: Decreased air movement and diffuse expiratory wheeze throughout.  Only able to speak in 2 word answers without taking a breath. Abdominal:     General: Bowel sounds are normal. There is no distension.     Palpations: Abdomen is  soft.     Tenderness: There is no abdominal tenderness. There is no right CVA tenderness, left CVA tenderness, guarding or rebound.     Comments: Soft, nontender.  No overlying skin changes to abdominal wall.  Musculoskeletal:        General: No swelling, tenderness, deformity or signs of injury. Normal range of motion.     Cervical back: Normal range of motion and neck supple.     Right lower leg: No edema.     Left lower leg: No edema.     Comments: Moves all 4 extremities without difficulty.  Compartments soft.  Bevelyn Buckles' sign negative.  Skin:    General: Skin is warm and dry.     Capillary Refill: Capillary refill takes less than 2 seconds.     Comments: Brisk cap refill  Neurological:     General: No focal deficit present.     Mental Status: He is alert and oriented to person, place, and time.     Comments: Ambulatory in room without difficulty    ED Results / Procedures / Treatments   Labs (all labs ordered are listed, but only abnormal results are displayed) Labs Reviewed  LACTIC ACID, PLASMA - Abnormal; Notable for the following components:      Result Value   Lactic Acid, Venous 2.0 (*)    All other components within normal limits  COMPREHENSIVE METABOLIC PANEL - Abnormal; Notable for the following components:   Sodium 134 (*)    Chloride 94 (*)    Glucose, Bld 128 (*)    Creatinine, Ser 1.68 (*)    Albumin 3.0 (*)    GFR calc non Af Amer 49 (*)    GFR calc Af Amer 57 (*)    All other components within normal limits  CBC WITH DIFFERENTIAL/PLATELET - Abnormal; Notable for the following components:   WBC 23.6 (*)    RBC 6.54 (*)    MCV 69.3 (*)    MCH 21.7 (*)    RDW 18.7 (*)    Neutro Abs 20.0 (*)    Lymphs Abs 0.5 (*)    Monocytes Absolute 1.4 (*)    Abs  Immature Granulocytes 1.69 (*)    All other components within normal limits  D-DIMER, QUANTITATIVE (NOT AT [facility]) - Abnormal; Notable for the following components:   D-Dimer, Quant 3.75 (*)    All other  components within normal limits  LACTATE DEHYDROGENASE - Abnormal; Notable for the following components:   LDH 416 (*)    All other components within normal limits  FERRITIN - Abnormal; Notable for the following components:   Ferritin 466 (*)    All other components within normal limits  TRIGLYCERIDES - Abnormal; Notable for the following components:   Triglycerides 258 (*)    All other components within normal limits  FIBRINOGEN - Abnormal; Notable for the following components:   Fibrinogen >800 (*)    All other components within normal limits  C-REACTIVE PROTEIN - Abnormal; Notable for the following components:   CRP 46.1 (*)    All other components within normal limits  RESPIRATORY PANEL BY RT PCR (FLU A&B, COVID)  CULTURE, BLOOD (ROUTINE X 2)  CULTURE, BLOOD (ROUTINE X 2)  URINE CULTURE  APTT  PROTIME-INR  PROCALCITONIN  LACTIC ACID, PLASMA  URINALYSIS, ROUTINE W REFLEX MICROSCOPIC    EKG EKG Interpretation  Date/Time:  Saturday July 16 2019 10:47:46 EST Ventricular Rate:  110 PR Interval:    QRS Duration: 151 QT Interval:  353 QTC Calculation: 478 R Axis:   30 Text Interpretation: Sinus tachycardia Left atrial enlargement IVCD, consider atypical RBBB Nonspecific ST depression Confirmed by Dene Gentry [phone]) on 07/16/2019 2:10:58 PM   Radiology CT Angio Chest PE W/Cm &/Or Wo Cm  Result Date: 07/16/2019 CLINICAL DATA:  Shortness of breath. EXAM: CT ANGIOGRAPHY CHEST WITH CONTRAST TECHNIQUE: Multidetector CT imaging of the chest was performed using the standard protocol during bolus administration of intravenous contrast. Multiplanar CT image reconstructions and MIPs were obtained to evaluate the vascular anatomy. CONTRAST:  54m OMNIPAQUE IOHEXOL 350 MG/ML SOLN COMPARISON:  March 16, 2019. FINDINGS: Cardiovascular: Unfortunately, there is limited opacification of the pulmonary arteries. There is no evidence of large central pulmonary embolus in the main pulmonary  artery or main portions of the right and left pulmonary arteries. However, smaller emboli in the more peripheral branches of the pulmonary arteries cannot be excluded on the basis of this exam. Normal cardiac size. No pericardial effusion. Mediastinum/Nodes: Stable calcified adenopathy is noted consistent with history of sarcoidosis. The esophagus is unremarkable. Thyroid gland is unremarkable. Lungs/Pleura: No pneumothorax is noted. There is interval development of multiple rounded ill-defined opacities throughout both lungs concerning for multifocal pneumonia or septic emboli. The largest measures 9.7 x 8.6 cm in the right lung apex. Cavitary abnormality measuring 5.0 x 4.3 cm is noted in the left lower lobe posteriorly most consistent with cavitary pneumonia. 1.9 cm cavitary abnormality is also noted in the left lower lobe. Upper Abdomen: No acute abnormality. Musculoskeletal: No chest wall abnormality. No acute or significant osseous findings. Review of the MIP images confirms the above findings. IMPRESSION: 1. Unfortunately, there is limited opacification of the pulmonary arteries. There is no evidence of large central pulmonary embolus in the main pulmonary artery or main portions of the right and left pulmonary arteries. However, smaller emboli in the more peripheral branches of the pulmonary arteries cannot be excluded on the basis of this exam. 2. Stable calcified adenopathy is noted consistent with history of sarcoidosis. 3. Interval development of multiple rounded ill-defined opacities throughout both lungs concerning for multifocal pneumonia or septic emboli. The largest measures 9.7 x 8.6 cm  in the right lung apex. Cavitary abnormality measuring 5.0 x 4.3 cm is noted in the left lower lobe posteriorly most consistent with cavitary pneumonia. 1.9 cm cavitary abnormality is also noted in the left lower lobe. Electronically Signed   By: Marijo Conception M.D.   On: 07/16/2019 13:42   DG Chest Port 1  View  Result Date: 07/16/2019 CLINICAL DATA:  44 year old presenting with a 1 week history of fever and productive cough. Current history of sarcoidosis. EXAM: PORTABLE CHEST 1 VIEW COMPARISON:  Chest x-rays 02/25/2018 and earlier, CT chest 03/16/2019. FINDINGS: Since the most recent prior CT in October, 2020, development of dense consolidation and volume loss in the RIGHT UPPER LOBE. The micronodularity and chronic scarring related to sarcoidosis are less conspicuous on the chest x-ray. No new pulmonary parenchymal abnormalities elsewhere. Cardiac silhouette normal in size, unchanged. IMPRESSION: Acute RIGHT UPPER LOBE atelectasis and/or pneumonia superimposed upon chronic findings of sarcoidosis. Electronically Signed   By: Evangeline Dakin M.D.   On: 07/16/2019 12:09    Procedures .Critical Care Performed by: Nettie Elm, PA-C Authorized by: Nettie Elm, PA-C   Critical care provider statement:    Critical care time (minutes):  61   Critical care was necessary to treat or prevent imminent or life-threatening deterioration of the following conditions:  Respiratory failure and sepsis   Critical care was time spent personally by me on the following activities:  Discussions with consultants, evaluation of patient's response to treatment, examination of patient, ordering and performing treatments and interventions, ordering and review of laboratory studies, ordering and review of radiographic studies, pulse oximetry, re-evaluation of patient's condition, obtaining history from patient or surrogate and review of old charts   (including critical care time)  Medications Ordered in ED Medications  cefTRIAXone (ROCEPHIN) 2 g in sodium chloride 0.9 % 100 mL IVPB (0 g Intravenous Stopped 07/16/19 1149)  azithromycin (ZITHROMAX) 500 mg in sodium chloride 0.9 % 250 mL IVPB (0 mg Intravenous Stopped 07/16/19 1310)  sodium chloride (PF) 0.9 % injection (has no administration in time range)   lactated ringers bolus 1,000 mL (0 mLs Intravenous Stopped 07/16/19 1347)    And  lactated ringers bolus 1,000 mL (0 mLs Intravenous Stopped 07/16/19 1347)    And  lactated ringers bolus 600 mL (0 mLs Intravenous Stopped 07/16/19 1311)  albuterol (VENTOLIN HFA) 108 (90 Base) MCG/ACT inhaler 6 puff (6 puffs Inhalation Given 07/16/19 1123)  iohexol (OMNIPAQUE) 350 MG/ML injection 80 mL (80 mLs Intravenous Contrast Given 07/16/19 1309)  acetaminophen (TYLENOL) tablet 650 mg (650 mg Oral Given 07/16/19 1347)    ED Course  I have reviewed the triage vital signs and the nursing notes.  Pertinent labs & imaging results that were available during my care of the patient were reviewed by me and considered in my medical decision making (see chart for details).  29 old presents for evaluation of upper respiratory symptoms.  On arrival patient febrile, tachycardic, tachypneic and hypoxic.  He has diffuse wheezing and minimal air movement throughout.  Was seen by urgent care 3 days ago.  He is on chronic prednisone for sarcoid.  He denies any chest pain.  No evidence of fluid overload on exam.  Has had 2 - Covid test over the last 10 days.  Given 2 - Covid test code sepsis called with antibiotics for upper respiratory source and IV antibiotics and IVF.  BMI greater than 35 ingested 30/cc/kg bolus ordered per sepsis order set. Plan on labs.  Patient placed on 2 L nasal cannula for hypoxia.  Patient will need admission for acute hypoxic respiratory failure.  Labs and imaging personally reviewed and interpreted:  Clinical Course as of Jul 16 1411  Sat Jul 16, 2019  1201 Significantly elevated, will order CTA chest to rule out PE  D-dimer, quantitative(!) [BH]  1252 Mild hyponatremia to 134, getting IV fluids, hyperglycemia to 128, on steroids, creatinine 1.68, up from baseline 1.3, decreased GFR.  Comprehensive metabolic panel(!) [BH]  1561 Leukocytosis at 23.6, he is on chronic steroids however he had a normal white  count 3 weeks ago.  CBC WITH DIFFERENTIAL(!) [BH]  1252 Influenza, Covid negative  Respiratory Panel by RT PCR (Flu A&B, Covid) - Nasopharyngeal Swab [BH]  1252 Elevated lactic acid 2.0  Lactic acid, plasma(!!) [BH]  1253 Chest x-ray right upper lobe pneumonia superimposed on chronic sarcoid.  DG Chest Port 1 View [BH]  1253 Elevated  Fibrinogen(!) [BH]  1253 Elevated  Lactate dehydrogenase(!) [BH]  1253 Elevated  Ferritin(!) [BH]  1253 Elevated  C-reactive protein(!) [BH]  1412 Multifocal focal pneumonia with possible cavitary lesion.  Patient with no risk factors for TB.  Possible septic emboli, patient denies any IV drug use.  CT Angio Chest PE W/Cm &/Or Wo Cm [BH]    Clinical Course User Index [BH] Ahlaya Ende A, PA-C    1315: Patient reassessed. Patient with rigors. Oral temp recheck personally at 101.2. Will give Tylenol. Admits to continued mild SOB. ON 3 L [place]. Continued tachycardia and tachypnea. No risk factors for TB except for chronic steroid use. Will consult with hospitalist for admission. May possible need bronc if cavitary lesions. No CP, low suspicion for atypical ACS, dissection as cause of SOB, cough.  CT with multifocal pneumonia versus septic emboli, no IV drug use.  Possible cavitary lesion, no risk factors for TB.  CONSULT with Dr. Marice Potter with [facility] who will evaluate patient for admission.   Patient seen and evaluated by Dr. Francia Greaves who agrees with above treatment, plan and disposition.  The patient appears reasonably stabilized for admission considering the current resources, flow, and capabilities available in the ED at this time, and I doubt any other [facility] requiring further screening and/or treatment in the ED prior to admission.     MDM Rules/Calculators/A&P                      Sean Sharp was evaluated in Emergency Department on 07/16/2019 for the symptoms described in the history of present illness. He was evaluated in the context of the global  COVID-19 pandemic, which necessitated consideration that the patient might be at risk for infection with the SARS-CoV-2 virus that causes COVID-19. Institutional protocols and algorithms that pertain to the evaluation of patients at risk for COVID-19 are in a state of rapid change based on information released by regulatory bodies including the CDC and federal and state organizations. These policies and algorithms were followed during the patient's care in the ED. Final Clinical Impression(s) / ED Diagnoses Final diagnoses:  Sepsis with acute hypoxic respiratory failure without septic shock, due to unspecified organism [facility])  Acute respiratory failure with hypoxia ([place])  Community acquired pneumonia of right upper lobe of lung  Cavitary pneumonia    Rx / DC Orders ED Discharge Orders    None       Ailea Rhatigan A, PA-C 07/16/19 1413    Valarie Merino, MD 07/17/19 [phone]

## 2019-07-16 NOTE — Progress Notes (Signed)
Pharmacy Antibiotic Note  Sean Sharp is a 44 y.o. male presented to the ED on 07/16/2019 with fever.  CXR and CT showed findings with concerns for PNA.Marland Kitchen  Pharmacy was consulted to dose vancomycin for PNA.  Today, 07/16/2019: - wbc 23.6 - scr 1.68 (crcl~84) - Temp 100.5  Plan: - vancomycin 2000 mg IV x1, then 1000 mg IV q12h for est AUC 490 - ceftriaxone 2gm IV q24h and azithromycin 500 mg IV q24h per MD - watch scr __________________________________  Height: 6' 2" (188 cm) Weight: (!) 305 lb (138.3 kg) IBW/kg (Calculated) : 82.2  Temp (24hrs), Avg:100.5 F (38.1 C), Min:100.5 F (38.1 C), Max:100.5 F (38.1 C)  Recent Labs  Lab 07/16/19 1055 07/16/19 1103 07/16/19 1316  WBC  --  23.6*  --   CREATININE  --  1.68*  --   LATICACIDVEN 2.0*  --  2.9*    Estimated Creatinine Clearance: 83.9 mL/min (A) (by C-G formula based on SCr of 1.68 mg/dL (H)).    Allergies  Allergen Reactions  . Bee Venom Swelling     Thank you for allowing pharmacy to be a part of this patient's care.  Lynelle Doctor 07/16/2019 2:51 PM

## 2019-07-16 NOTE — ED Notes (Signed)
Report given to Katie, RN.

## 2019-07-16 NOTE — Progress Notes (Signed)
Patients MEWS is red due to temp, RR, and HR. MD notified and Tylenol and fluid bolus ordered. Rapid response RN Christian made aware. Pt felt like he needed to have a bowel movement so he was assisted into the bathroom and vital signs per MEWS protocol implemented once patient back into bed. Patient is alert and oriented and was oriented to room as well. Will continue with plan of care.

## 2019-07-17 ENCOUNTER — Encounter (HOSPITAL_COMMUNITY): Payer: Self-pay | Admitting: Family Medicine

## 2019-07-17 DIAGNOSIS — J189 Pneumonia, unspecified organism: Secondary | ICD-10-CM

## 2019-07-17 DIAGNOSIS — A419 Sepsis, unspecified organism: Principal | ICD-10-CM

## 2019-07-17 LAB — COMPREHENSIVE METABOLIC PANEL
ALT: 29 U/L (ref 0–44)
AST: 49 U/L — ABNORMAL HIGH (ref 15–41)
Albumin: 2.4 g/dL — ABNORMAL LOW (ref 3.5–5.0)
Alkaline Phosphatase: 106 U/L (ref 38–126)
Anion gap: 12 (ref 5–15)
BUN: 16 mg/dL (ref 6–20)
CO2: 24 mmol/L (ref 22–32)
Calcium: 8.7 mg/dL — ABNORMAL LOW (ref 8.9–10.3)
Chloride: 99 mmol/L (ref 98–111)
Creatinine, Ser: 1.23 mg/dL (ref 0.61–1.24)
GFR calc Af Amer: >60 mL/min (ref >60)
GFR calc non Af Amer: >60 mL/min (ref >60)
Glucose, Bld: 117 mg/dL — ABNORMAL HIGH (ref 70–99)
Potassium: 4.8 mmol/L (ref 3.5–5.1)
Sodium: 135 mmol/L (ref 135–145)
Total Bilirubin: 0.9 mg/dL (ref 0.3–1.2)
Total Protein: 7.2 g/dL (ref 6.5–8.1)

## 2019-07-17 LAB — CBC
HCT: 44 % (ref 39.0–52.0)
Hemoglobin: 13.6 g/dL (ref 13.0–17.0)
MCH: 21.7 pg — ABNORMAL LOW (ref 26.0–34.0)
MCHC: 30.9 g/dL (ref 30.0–36.0)
MCV: 70.1 fL — ABNORMAL LOW (ref 80.0–100.0)
Platelets: 204 10*3/uL (ref 150–400)
RBC: 6.28 MIL/uL — ABNORMAL HIGH (ref 4.22–5.81)
RDW: 18.9 % — ABNORMAL HIGH (ref 11.5–15.5)
WBC: 19.3 10*3/uL — ABNORMAL HIGH (ref 4.0–10.5)
nRBC: 0 % (ref 0.0–0.2)

## 2019-07-17 LAB — HIV ANTIBODY (ROUTINE TESTING W REFLEX): HIV Screen 4th Generation wRfx: NONREACTIVE

## 2019-07-17 LAB — STREP PNEUMONIAE URINARY ANTIGEN
Strep Pneumo Urinary Antigen: NEGATIVE
Strep Pneumo Urinary Antigen: NEGATIVE

## 2019-07-17 MED ORDER — LEVALBUTEROL HCL 0.63 MG/3ML IN NEBU: 0.6300 mg | INHALATION_SOLUTION | Freq: Four times a day (QID) | RESPIRATORY_TRACT | Status: DC

## 2019-07-17 MED ORDER — LEVALBUTEROL TARTRATE 45 MCG/ACT IN AERO: 2.0000 | INHALATION_SPRAY | Freq: Three times a day (TID) | RESPIRATORY_TRACT | Status: DC

## 2019-07-17 MED ORDER — SIMETHICONE 80 MG PO CHEW
80.0000 mg | CHEWABLE_TABLET | Freq: Once | ORAL | Status: AC
  Administered 2019-07-17: 80 mg via ORAL
  Filled 2019-07-17: qty 1

## 2019-07-17 MED ORDER — IPRATROPIUM BROMIDE HFA 17 MCG/ACT IN AERS: 2.0000 | INHALATION_SPRAY | Freq: Three times a day (TID) | RESPIRATORY_TRACT | Status: DC

## 2019-07-17 MED ORDER — LEVALBUTEROL TARTRATE 45 MCG/ACT IN AERO
2.0000 | INHALATION_SPRAY | Freq: Four times a day (QID) | RESPIRATORY_TRACT | Status: DC
  Administered 2019-07-17 – 2019-07-19 (×8): 2 via RESPIRATORY_TRACT
  Filled 2019-07-17: qty 15

## 2019-07-17 MED ORDER — HYDROCOD POLST-CPM POLST ER 10-8 MG/5ML PO SUER
5.0000 mL | Freq: Two times a day (BID) | ORAL | Status: DC
  Administered 2019-07-17 – 2019-07-18 (×4): 5 mL via ORAL
  Filled 2019-07-17 (×4): qty 5

## 2019-07-17 MED ORDER — IPRATROPIUM BROMIDE HFA 17 MCG/ACT IN AERS
2.0000 | INHALATION_SPRAY | Freq: Four times a day (QID) | RESPIRATORY_TRACT | Status: DC
  Administered 2019-07-17 – 2019-07-19 (×8): 2 via RESPIRATORY_TRACT
  Filled 2019-07-17: qty 12.9

## 2019-07-17 MED ORDER — IPRATROPIUM BROMIDE 0.02 % IN SOLN: 2.5000 mL | Freq: Four times a day (QID) | RESPIRATORY_TRACT | Status: DC

## 2019-07-17 NOTE — Progress Notes (Signed)
Dr. Nevada Crane made aware of patients short episode of dizziness. Oxygen turned up to 4L with saturations at 95%. Patient resting comfortably in bed. No new orders received. Will continue with plan of care.

## 2019-07-17 NOTE — Consult Note (Signed)
NAME:  Sean Sharp, MRN:  [id number], DOB:  02/07/1976, LOS: 1 ADMISSION DATE:  07/16/2019, CONSULTATION DATE: 07/17/2019 REFERRING MD: Dr. Nevada Crane, CHIEF COMPLAINT: Pneumonia  Brief History   44 year old gentleman with a history of pulmonary sarcoidosis Came in with shortness of breath, cough, fever Had been to urgent care about a week ago-increase this dose of steroids Has been having symptoms of nasal stuffiness and congestion for which he started Zyrtec -Does suffer from recurrent sinus congestion and rhinitis  Poor p.o. intake for about 2 to 3 days progressive shortness of breath, cough, fevers chills diarrhea  Tested negative for Covid  Progression of his symptoms led to presentation to the emergency department  Past Medical History   Past Medical History:  Diagnosis Date  . Allergic rhinitis   . 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  Hemoglobin electrophoresis normal 2016.  [facility] Kitchen Chronic renal insufficiency, stage II (mild)   . Cutaneous sarcoidosis    [place] rheum: as of 02/2019->plaquenil.  Cont plaquenil and slowly tapering prednisone as of 06/2019 rheum f/u  . Hoarseness 2015   [facility] 04/2014-normal laryngoscopy: prednisone taper and bid nexium recommended.  No signif help so pt got 2nd opinion at [facility]   . HTN (hypertension)   . Hydrocele    left  . LPRD (laryngopharyngeal reflux disease)   . Obesity, Class II, BMI 35-39.9   . Olecranon bursitis of right elbow 04/29/2018   persistent/recurrent s/p aspiration-->bursa excision   . Peripheral edema    left leg>right leg  . Prediabetes 02/2017   A1c 6.3% (right when he was starting to take prednisone prn for treatment of his sarcoidosis).  HbA1c 08/2017=6.3%.  . Pulmonary arterial hypertension (HCC)    Mild (R heart cath 06/27/19), echo 06/2019 normal.  Wt loss and CPAP recommended  . 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 Melvyn Novas suspects it was smoldering since 2011.  Skin bx of cutaneous lesion confirmed dx of  sarcoidosis 02/2017.  Pt improving fall 2018 with plaquenil and prednisone.  Pt self d/c'd plaquenil, takes pred prn per his own judgement, pt commonly takes meds incorrectly/not as recommended by MDs.  . Vocal cord polyps 2015   surgery [facility]     Significant Hospital Events   Symptoms are improving since has been in hospital  Consults:  CCM 2/7  Procedures:  None  Significant Diagnostic Tests:  CT scan of the chest IMPRESSION: 1. Unfortunately, there is limited opacification of the pulmonary arteries. There is no evidence of large central pulmonary embolus in the main pulmonary artery or main portions of the right and left pulmonary arteries. However, smaller emboli in the more peripheral branches of the pulmonary arteries cannot be excluded on the basis of this exam. 2. Stable calcified adenopathy is noted consistent with history of sarcoidosis. 3. Interval development of multiple rounded ill-defined opacities throughout both lungs concerning for multifocal pneumonia or septic emboli. The largest measures 9.7 x 8.6 cm in the right lung apex. Cavitary abnormality measuring 5.0 x 4.3 cm is noted in the left lower lobe posteriorly most consistent with cavitary pneumonia. 1.9 cm cavitary abnormality is also noted in the left lower lobe.  Echo from 06/22/2019 -No evidence of pulmonary hypertension Micro Data:  Blood culture 2/6>> MRSA PCR 2/6  Urine culture 2/6>>  Antimicrobials:  Azithromycin 2/6>> Rocephin 2/6 Piperacillin 2/6>> Vancomycin 2/6  Interim history/subjective:  Symptomatically feeling better Still has cough and congestion  Objective   Blood pressure (!) 141/78,  pulse 93, temperature 99.1 F (37.3 C), temperature source Oral, resp. rate 20, height _0  (1.88 m), weight (!) 138.3 kg, SpO2 96 %.        Intake/Output Summary (Last 24 hours) at 07/17/2019 1228 Last data filed at 07/17/2019 1000 Gross per 24 hour  Intake 5685.2 ml  Output 950 ml  Net 4735.2  ml   Filed Weights   07/16/19 1050  Weight: (!) 138.3 kg    Examination: General: Middle-age gentleman, does not appear to be in distress HENT: Moist oral mucosa Lungs: Decreased air movement at the bases bilaterally Cardiovascular: S1-S2 appreciated Abdomen: Soft, bowel sounds appreciated Extremities: No clubbing, no edema Neuro: Awake and alert, nonfocal exam GU:   Resolved Hospital Problem list     Assessment & Plan:  Multilobar pneumonia -Multifocal infiltrate on CT scan of the chest -Patient on antibiotics will continue the same -Zosyn, azithromycin -Vancomycin discontinued -Clinically does appear to be feeling better already -Follow-up sputum cultures -Obtain urine pneumococcal and Legionella antigen  Sepsis from multilobar pneumonia  -Continue antibiotics -Trend lactate  Sarcoidosis -Did have scarring in his lung on previous CT scan with some infiltrative processes -Finding on CT currently is related to associated infection -Should have a repeat CT scan about 6 weeks following resolution of symptoms from his pneumonia to asses scarring from sarcoidosis  Hypoxemic respiratory failure -Current continue oxygen supplementation -Oxygen requirement is likely related to multifocal infiltrates and baseline lung disease  Acute kidney injury -Likely prerenal secondary to acute illness -Avoid nephrotoxic's, trend electrolytes  Hypertension  GERD  We will continue current antibiotics, may be switched to oral antibiotics after about 48 to 72 hours if he continues to feel better  He should follow-up with Dr. Melvyn Novas who had seen him for sarcoidosis in the past   Best practice:  Diet: Regular Mobility: As tolerated Code Status: Full code Family Communication: Discussed with patient and spouse at bedside Disposition: [place]   CBC: Recent Labs  Lab 07/16/19 1103 07/16/19 1825 07/17/19 0526  WBC 23.6* 19.2* 19.3*  NEUTROABS 20.0*  --   --   HGB 14.2  12.6* 13.6  HCT 45.3 40.3 44.0  MCV 69.3* 69.6* 70.1*  PLT 227 199 739    Basic Metabolic Panel: Recent Labs  Lab 07/16/19 1103 07/17/19 0526  NA 134* 135  K 4.0 4.8  CL 94* 99  CO2 26 24  GLUCOSE 128* 117*  BUN 19 16  CREATININE 1.68* 1.23  CALCIUM 8.9 8.7*   GFR: Estimated Creatinine Clearance: 114.6 mL/min (by C-G formula based on SCr of 1.23 mg/dL). Recent Labs  Lab 07/16/19 1055 07/16/19 1103 07/16/19 1316 07/16/19 1555 07/16/19 1825 07/16/19 2109 07/17/19 0526  PROCALCITON  --  2.61  --   --   --   --   --   WBC  --  23.6*  --   --  19.2*  --  19.3*  LATICACIDVEN   < >  --  2.9* 2.5* 2.6* 2.3*  --    < > = values in this interval not displayed.    Liver Function Tests: Recent Labs  Lab 07/16/19 1103 07/17/19 0526  AST 35 49*  ALT 28 29  ALKPHOS 109 106  BILITOT 0.9 0.9  PROT 8.1 7.2  ALBUMIN 3.0* 2.4*   No results for input(s): LIPASE, AMYLASE in the last 168 hours. No results for input(s): AMMONIA in the last 168 hours.  ABG    Component Value Date/Time  HCO3 25.9 06/27/2019 0825   TCO2 27 06/27/2019 0825   O2SAT 72.0 06/27/2019 0825     Coagulation Profile: Recent Labs  Lab 07/16/19 1103  INR 1.2    Cardiac Enzymes: No results for input(s): CKTOTAL, CKMB, CKMBINDEX, TROPONINI in the last 168 hours.  HbA1C: Hgb A1c MFr Bld  Date/Time Value Ref Range Status  04/20/2018 10:40 AM 6.2 4.6 - 6.5 % Final    Comment:    Glycemic Control Guidelines for People with Diabetes:Non Diabetic:  <6%Goal of Therapy: <7%Additional Action Suggested:  >8%   09/04/2017 10:10 AM 6.3 4.6 - 6.5 % Final    Comment:    Glycemic Control Guidelines for People with Diabetes:Non Diabetic:  <6%Goal of Therapy: <7%Additional Action Suggested:  >8%     CBG: No results for input(s): GLUCAP in the last 168 hours.  Review of Systems:   Admits to fever, cough, shortness of breath-all improvement of present  Past Medical History  He,  has a past medical  history of Allergic rhinitis, Beta thalassemia minor, Chronic renal insufficiency, stage II (mild), Cutaneous sarcoidosis, Hoarseness (2015), HTN (hypertension), Hydrocele, LPRD (laryngopharyngeal reflux disease), Obesity, Class II, BMI 35-39.9, Olecranon bursitis of right elbow (04/29/2018), Peripheral edema, Prediabetes (02/2017), Pulmonary arterial hypertension ([place]), Sarcoidosis of lung ([place]) (12/2016), and Vocal cord polyps (2015).   Surgical History    Past Surgical History:  Procedure Laterality Date  . OLECRANON BURSECTOMY Right 2019  . RIGHT HEART CATH N/A 06/27/2019   Mild PAH. Procedure: RIGHT HEART CATH;  Surgeon: Jolaine Artist, MD;  Location: [facility];  Service: Cardiovascular;  Laterality: N/A;  . 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  . SKIN GRAFT  2010   on left index finger  . TRANSTHORACIC ECHOCARDIOGRAM  06/27/2019   NORMAL  . Vocal cord surgery  2016   [facility]  . WISDOM TOOTH EXTRACTION  2005     Social History   reports that he quit smoking about 24 years ago. His smoking use included cigarettes. He has a 0.45 pack-year smoking history. He has never used smokeless tobacco. He reports current alcohol use. He reports that he does not use drugs.   Family History   His family history includes Cancer in his paternal grandfather; Lactose intolerance in his father and mother; Other in his mother; Sarcoidosis in his father.   Allergies Allergies  Allergen Reactions  . Bee Venom Swelling     Home Medications  Prior to Admission medications   Medication Sig Start Date End Date Taking? Authorizing Provider  amLODipine (NORVASC) 10 MG tablet TAKE 1 TABLET BY MOUTH EVERY DAY Patient taking differently: Take 10 mg by mouth daily.  04/08/19  Yes McGowen, Adrian Blackwater, MD  Clocortolone Pivalate (CLODERM) 0.1 % cream Apply 1 application topically 2 (two) times daily as needed (Sarcoidosis/rash).  03/24/19  Yes [provider]  HYDROcodone-homatropine (HYCODAN) 5-1.5 MG/5ML syrup Take 5 mLs by mouth every 6 (six) hours as needed for cough. 07/13/19  Yes Hagler, Aaron Edelman, MD  hydrocortisone butyrate (LUCOID) 0.1 % CREA cream Apply 1 application topically 2 (two) times daily. Patient taking differently: Apply 1 application topically 2 (two) times daily as needed (Sarcoidosis).  09/04/17  Yes McGowen, Adrian Blackwater, MD  hydroxychloroquine (PLAQUENIL) 200 MG tablet Take 2 daily Patient taking differently: Take 400 mg by mouth daily.  09/06/18  Yes Tanda Rockers, MD  metoprolol succinate (TOPROL-XL) 25 MG 24 hr tablet TAKE  1 TABLET BY MOUTH EVERY DAY Patient taking differently: Take 25 mg by mouth daily.  02/08/19  Yes McGowen, Adrian Blackwater, MD  pantoprazole (PROTONIX) 40 MG tablet Take 30- 60 min before your first and last meals of the day Patient taking differently: Take 40 mg by mouth daily before breakfast. Take 30- 60 min before your first and last meals of the day 09/06/18  Yes Tanda Rockers, MD  predniSONE (DELTASONE) 10 MG tablet Take 10 mg by mouth daily with breakfast.   Yes [provider]  predniSONE (DELTASONE) 20 MG tablet Take 20 mg by mouth daily with breakfast.   Yes [provider]  spironolactone (ALDACTONE) 25 MG tablet Take 0.5 tablets (12.5 mg total) by mouth daily. 06/22/19  Yes Bensimhon, Shaune Pascal, MD  traZODone (DESYREL) 50 MG tablet 1-2 tabs po qhs prn insomnia Patient taking differently: Take 50-100 mg by mouth at bedtime as needed for sleep.  04/20/18  Yes McGowen, Adrian Blackwater, MD     Sherrilyn Rist, MD [place], PCCM Cell: [phone]

## 2019-07-17 NOTE — Progress Notes (Signed)
Pt c/o dizziness while sitting on bench in room with wife at bedside.  Pt denies SOB, palpitations, other symptoms.  HR 90s, BP 149/69.  Pt back to bed with no issue, resting with wife at bedside at this time.  Instructed to call before getting out of bed so that staff can assist patient in case he feels dizzy again, wife verbalized understanding.  Will continue to monitor.

## 2019-07-17 NOTE — Progress Notes (Signed)
PROGRESS NOTE  Sean Sharp JTT:[id number] DOB: August 15, 1975 DOA: 07/16/2019 PCP: Tammi Sou, MD  HPI/Recap of past 24 hours: Sean Sharp is a 44 y.o. male with PMH HTN, pulmonary sarcoidosis, pulmonary HTN, obesity who presented to ED with fever, cough and SOB and found to have sepsis secondary to CAP/cavitary pneumonia.  Diagnosed with sarcoidosis about 8 years ago and has been on and off steroids since that time. Currently on a steroid taper.   In the ED: Vitals remarkable for elevated BP, tachycardia, tachypnea requiring 3L O2 and febrile. Initial Lactate 2.0. Ferritin 466, CRP 46, Na 134, Cr 1.68, WBC 23.6, Hgb 14.2, Plt 227, D-dimer 3.75, Procal 2.61, LDH 416, Fibrinogen > 800, COVID negative. Patient given Tylenol, Albuterol inhaler, Azithromycin, Rocephin and 2.6 L LR. Repeat Lactate 2.9. CXR: Acute RIGHT UPPER LOBE atelectasis and/or pneumonia superimposed upon chronic findings of sarcoidosis. CTA: Concerning for cavitary PNA, multifocal PNA.  Interval development of multiple rounded ill-defined opacities throughout both lungs concerning for multifocal pneumonia or septic emboli.  EKG: HR 110. Sinus tachycardia. Known RBBB when compared to prior. No STEMI.   07/17/19: Seen and examined.  Reports persistent productive cough and dyspnea.  Hypoxic, not on oxygen supplementation at baseline.  Assessment/Plan: Principal Problem:   Sepsis due to pneumonia [facility]) Active Problems:   Allergic rhinitis   GERD   Cough   Essential hypertension   Sarcoidosis with skin and lung involvement    Morbid obesity due to excess calories ([place])   Cavitary pneumonia  Sepsis secondary to multifocal community-acquired pneumonia and cavitary pneumonia in the setting of pulmonary sarcoidosis, poa Presented with worsening cough, leukocytosis, elevated procalcitonin, elevated lactic acid, tachycardia, tachypnea hypoxia Personally reviewed CT chest which showed bilateral pulmonary infiltrates  with cavitary lesion on the left and around the heel defined opacities bilaterally the largest on the right measuring 9 x 8 cm, on the left measuring 5 x 4 cm Started on IV vancomycin, IV azithromycin and IV Zosyn empirically MRSA negative, DC'd IV vancomycin. Monitor fever curve and WBC Sputum culture in process Blood cultures x2 - to date, continue to follow cultures Continue home prednisone and Plaquenil Start bronchodilators and pulmonary toilet Pulmonary consult  Acute hypoxemic respiratory failure likely secondary to above Not on oxygen supplementation at baseline Maintain O2 saturation greater 92%  AKI Baseline creatinine appears to be 1.2 with GFR greater than 60 Presented with creatinine of 1.68 with GFR 57 Avoid nephrotoxins and hypotension Monitor urine output Daily BMPs  History of pulmonary sarcoidosis Continue home medications Had a 2D echo done on 06/22/2019 which showed LVEF 60 to 65% with no evidence of PAH  Essential hypertension Resume home medications Norvasc and metoprolol Spironolactone on hold due to AKI Continue to monitor vital signs  GERD Continue PPI  Code Status: Full Code DVT Prophylaxis:  Subcu Lovenox daily.    Family Communication:  None at bedside.   Disposition Plan:  Patient is from home.  Anticipate discharge to home in the next 48 to 72 hours once respiratory status is close to baseline.  Barrier to discharge: Treatment for sepsis with ongoing IV antibiotic infusion.  Objective: Vitals:   07/16/19 2237 07/16/19 2328 07/17/19 0319 07/17/19 0737  BP: (!) 161/84 (!) 141/84 (!) 142/76 (!) 150/84  Pulse: 98 90 94 98  Resp: (!) 24 (!) 24 (!) 24 (!) 21  Temp: 99.1 F (37.3 C) 99.8 F (37.7 C) 99.8 F (37.7 C) 98.7 F (37.1 C)  TempSrc: Oral Oral Oral Oral  SpO2: 98% 97% 95% 98%  Weight:      Height:        Intake/Output Summary (Last 24 hours) at 07/17/2019 1000 Last data filed at 07/17/2019 0930 Gross per 24 hour  Intake 5777.69  ml  Output 950 ml  Net 4827.69 ml   Filed Weights   07/16/19 1050  Weight: (!) 138.3 kg    Exam:  . General: 44 y.o. year-old male well developed well nourished in no acute distress.  Alert and oriented x3. . Cardiovascular: Regular rate and rhythm with no rubs or gallops.  No thyromegaly or JVD noted.   Marland Kitchen Respiratory: Diffuse rales bilaterally.  Poor inspiratory effort. . Abdomen: Soft nontender nondistended with normal bowel sounds x4 quadrants. . Musculoskeletal: Trace lower extremity edema.  Marland Kitchen Psychiatry: Mood is appropriate for condition and setting   Data Reviewed: CBC: Recent Labs  Lab 07/16/19 1103 07/16/19 1825 07/17/19 0526  WBC 23.6* 19.2* 19.3*  NEUTROABS 20.0*  --   --   HGB 14.2 12.6* 13.6  HCT 45.3 40.3 44.0  MCV 69.3* 69.6* 70.1*  PLT 227 199 616   Basic Metabolic Panel: Recent Labs  Lab 07/16/19 1103 07/17/19 0526  NA 134* 135  K 4.0 4.8  CL 94* 99  CO2 26 24  GLUCOSE 128* 117*  BUN 19 16  CREATININE 1.68* 1.23  CALCIUM 8.9 8.7*   GFR: Estimated Creatinine Clearance: 114.6 mL/min (by C-G formula based on SCr of 1.23 mg/dL). Liver Function Tests: Recent Labs  Lab 07/16/19 1103 07/17/19 0526  AST 35 49*  ALT 28 29  ALKPHOS 109 106  BILITOT 0.9 0.9  PROT 8.1 7.2  ALBUMIN 3.0* 2.4*   No results for input(s): LIPASE, AMYLASE in the last 168 hours. No results for input(s): AMMONIA in the last 168 hours. Coagulation Profile: Recent Labs  Lab 07/16/19 1103  INR 1.2   Cardiac Enzymes: No results for input(s): CKTOTAL, CKMB, CKMBINDEX, TROPONINI in the last 168 hours. BNP (last 3 results) No results for input(s): PROBNP in the last 8760 hours. HbA1C: No results for input(s): HGBA1C in the last 72 hours. CBG: No results for input(s): GLUCAP in the last 168 hours. Lipid Profile: Recent Labs    07/16/19 1056  TRIG 258*   Thyroid Function Tests: No results for input(s): TSH, T4TOTAL, FREET4, T3FREE, THYROIDAB in the last 72  hours. Anemia Panel: Recent Labs    07/16/19 1056  FERRITIN 466*   Urine analysis:    Component Value Date/Time   COLORURINE YELLOW 07/16/2019 [place] 07/16/2019 1551   LABSPEC 1.026 07/16/2019 1551   PHURINE 5.0 07/16/2019 1551   GLUCOSEU NEGATIVE 07/16/2019 1551   HGBUR NEGATIVE 07/16/2019 1551   [place] 07/16/2019 1551   KETONESUR NEGATIVE 07/16/2019 1551   PROTEINUR 100 (A) 07/16/2019 1551   UROBILINOGEN 1.0 11/03/2013 1531   NITRITE NEGATIVE 07/16/2019 1551   LEUKOCYTESUR NEGATIVE 07/16/2019 1551   Sepsis Labs: _0 (procalcitonin:4,lacticidven:4)  ) Recent Results (from the past 240 hour(s))  Novel Coronavirus, NAA (Hosp order, Send-out to Ref Lab; TAT 18-24 hrs     Status: None   Collection Time: 07/13/19  8:34 PM   Specimen: Nasopharyngeal Swab; Respiratory  Result Value Ref Range Status   SARS-CoV-2, NAA NOT DETECTED NOT DETECTED Final    Comment: (NOTE) This nucleic acid amplification test was developed and its performance characteristics determined by [employer]. Nucleic acid amplification tests include RT-PCR and TMA. This test has not been FDA cleared  or approved. This test has been authorized by FDA under an Emergency Use Authorization (EUA). This test is only authorized for the duration of time the declaration that circumstances exist justifying the authorization of the emergency use of in vitro diagnostic tests for detection of SARS-CoV-2 virus and/or diagnosis of COVID-19 infection under section 564(b)(1) of the Act, 21 U.S.C. 150VWP-7(X) (1), unless the authorization is terminated or revoked sooner. When diagnostic testing is negative, the possibility of a false negative result should be considered in the context of a patient's recent exposures and the presence of clinical signs and symptoms consistent with COVID-19. An individual without symptoms of COVID- 19 and who is not shedding SARS-CoV-2  virus  would expect to have a negative (not detected) result in this assay. Performed At: [facility] [address] [place], [place] [id number] Rush Farmer MD SM:[id number]    [place]  Final    Comment: Performed at [facility], [place] [address] [place], [place] [address]  Blood Culture (routine x 2)     Status: None (Preliminary result)   Collection Time: 07/16/19 11:03 AM   Specimen: BLOOD  Result Value Ref Range Status   Specimen Description BLOOD RIGHT ANTECUBITAL  Final   Special Requests   Final    BAA Blood Culture results may not be optimal due to an excessive volume of blood received in culture bottles Performed at [facility], [place] [address], [place], [place] [address]    Culture NO GROWTH < 24 HOURS  Final   Report Status PENDING  Incomplete  Respiratory Panel by RT PCR (Flu A&B, Covid) - Nasopharyngeal Swab     Status: None   Collection Time: 07/16/19 11:03 AM   Specimen: Nasopharyngeal Swab  Result Value Ref Range Status   SARS Coronavirus 2 by RT PCR NEGATIVE NEGATIVE Final    Comment: (NOTE) SARS-CoV-2 target nucleic acids are NOT DETECTED. The SARS-CoV-2 RNA is generally detectable in upper respiratoy specimens during the acute phase of infection. The lowest concentration of SARS-CoV-2 viral copies this assay can detect is 131 copies/mL. A negative result does not preclude SARS-Cov-2 infection and should not be used as the sole basis for treatment or other patient management decisions. A negative result may occur with  improper specimen collection/handling, submission of specimen other than nasopharyngeal swab, presence of viral mutation(s) within the areas targeted by this assay, and inadequate number of viral copies (<131 copies/mL). A negative result must be combined with clinical observations, patient history, and epidemiological information. The expected result is Negative. Fact Sheet for Patients:   PinkCheek.be Fact Sheet for Healthcare Providers:  GravelBags.it This test is not yet ap proved or cleared by the [place] FDA and  has been authorized for detection and/or diagnosis of SARS-CoV-2 by FDA under an Emergency Use Authorization (EUA). This EUA will remain  in effect (meaning this test can be used) for the duration of the COVID-19 declaration under Section 564(b)(1) of the Act, 21 U.S.C. section 360bbb-3(b)(1), unless the authorization is terminated or revoked sooner.    Influenza A by PCR NEGATIVE NEGATIVE Final   Influenza B by PCR NEGATIVE NEGATIVE Final    Comment: (NOTE) The Xpert Xpress SARS-CoV-2/FLU/RSV assay is intended as an aid in  the diagnosis of influenza from Nasopharyngeal swab specimens and  should not be used as a sole basis for treatment. Nasal washings and  aspirates are unacceptable for Xpert Xpress SARS-CoV-2/FLU/RSV  testing. Fact Sheet for Patients: PinkCheek.be Fact  Sheet for Healthcare Providers: GravelBags.it This test is not yet approved or cleared by the [place] and  has been authorized for detection and/or diagnosis of SARS-CoV-2 by  FDA under an Emergency Use Authorization (EUA). This EUA will remain  in effect (meaning this test can be used) for the duration of the  Covid-19 declaration under Section 564(b)(1) of the Act, 21  U.S.C. section 360bbb-3(b)(1), unless the authorization is  terminated or revoked. Performed at [facility], [place] [address], [place], [place] [address]   Blood Culture (routine x 2)     Status: None (Preliminary result)   Collection Time: 07/16/19 11:04 AM   Specimen: BLOOD LEFT HAND  Result Value Ref Range Status   Specimen Description BLOOD LEFT HAND  Final   Special Requests   Final    AEB Blood Culture results may not be optimal due to an inadequate volume of  blood received in culture bottles Performed at [facility], [place] [address], [place], [place] [address]    Culture NO GROWTH < 24 HOURS  Final   Report Status PENDING  Incomplete  MRSA PCR Screening     Status: None   Collection Time: 07/16/19  3:04 PM   Specimen: Nasal Mucosa; Nasopharyngeal  Result Value Ref Range Status   MRSA by PCR NEGATIVE NEGATIVE Final    Comment:        The GeneXpert MRSA Assay (FDA approved for NASAL specimens only), is one component of a comprehensive MRSA colonization surveillance program. It is not intended to diagnose MRSA infection nor to guide or monitor treatment for MRSA infections. Performed at [facility], [place] [address], [place], [place] [address]       Studies: CT Angio Chest PE W/Cm &/Or Wo Cm  Result Date: 07/16/2019 CLINICAL DATA:  Shortness of breath. EXAM: CT ANGIOGRAPHY CHEST WITH CONTRAST TECHNIQUE: Multidetector CT imaging of the chest was performed using the standard protocol during bolus administration of intravenous contrast. Multiplanar CT image reconstructions and MIPs were obtained to evaluate the vascular anatomy. CONTRAST:  69m OMNIPAQUE IOHEXOL 350 MG/ML SOLN COMPARISON:  March 16, 2019. FINDINGS: Cardiovascular: Unfortunately, there is limited opacification of the pulmonary arteries. There is no evidence of large central pulmonary embolus in the main pulmonary artery or main portions of the right and left pulmonary arteries. However, smaller emboli in the more peripheral branches of the pulmonary arteries cannot be excluded on the basis of this exam. Normal cardiac size. No pericardial effusion. Mediastinum/Nodes: Stable calcified adenopathy is noted consistent with history of sarcoidosis. The esophagus is unremarkable. Thyroid gland is unremarkable. Lungs/Pleura: No pneumothorax is noted. There is interval development of multiple rounded ill-defined opacities throughout both lungs concerning  for multifocal pneumonia or septic emboli. The largest measures 9.7 x 8.6 cm in the right lung apex. Cavitary abnormality measuring 5.0 x 4.3 cm is noted in the left lower lobe posteriorly most consistent with cavitary pneumonia. 1.9 cm cavitary abnormality is also noted in the left lower lobe. Upper Abdomen: No acute abnormality. Musculoskeletal: No chest wall abnormality. No acute or significant osseous findings. Review of the MIP images confirms the above findings. IMPRESSION: 1. Unfortunately, there is limited opacification of the pulmonary arteries. There is no evidence of large central pulmonary embolus in the main pulmonary artery or main portions of the right and left pulmonary arteries. However, smaller emboli in the more peripheral branches of the pulmonary arteries cannot be excluded on the basis of this  exam. 2. Stable calcified adenopathy is noted consistent with history of sarcoidosis. 3. Interval development of multiple rounded ill-defined opacities throughout both lungs concerning for multifocal pneumonia or septic emboli. The largest measures 9.7 x 8.6 cm in the right lung apex. Cavitary abnormality measuring 5.0 x 4.3 cm is noted in the left lower lobe posteriorly most consistent with cavitary pneumonia. 1.9 cm cavitary abnormality is also noted in the left lower lobe. Electronically Signed   By: Marijo Conception M.D.   On: 07/16/2019 13:42   DG Chest Port 1 View  Result Date: 07/16/2019 CLINICAL DATA:  44 year old presenting with a 1 week history of fever and productive cough. Current history of sarcoidosis. EXAM: PORTABLE CHEST 1 VIEW COMPARISON:  Chest x-rays 02/25/2018 and earlier, CT chest 03/16/2019. FINDINGS: Since the most recent prior CT in October, 2020, development of dense consolidation and volume loss in the RIGHT UPPER LOBE. The micronodularity and chronic scarring related to sarcoidosis are less conspicuous on the chest x-ray. No new pulmonary parenchymal abnormalities elsewhere.  Cardiac silhouette normal in size, unchanged. IMPRESSION: Acute RIGHT UPPER LOBE atelectasis and/or pneumonia superimposed upon chronic findings of sarcoidosis. Electronically Signed   By: Evangeline Dakin M.D.   On: 07/16/2019 12:09    Scheduled Meds: . amLODipine  10 mg Oral Daily  . enoxaparin (LOVENOX) injection  70 mg Subcutaneous Q24H  . hydroxychloroquine  400 mg Oral Daily  . metoprolol succinate  25 mg Oral Daily  . pantoprazole  40 mg Oral QAC breakfast  . predniSONE  30 mg Oral Q breakfast    Continuous Infusions: . azithromycin Stopped (07/16/19 1310)  . piperacillin-tazobactam (ZOSYN)  IV 12.5 mL/hr at 07/17/19 0600  . vancomycin Stopped (07/17/19 0333)     LOS: 1 day     Kayleen Memos, MD [facility] Pager [phone]  If 7PM-7AM, please contact night-coverage [email or web address] Password [email or web address] 07/17/2019, 10:00 AM

## 2019-07-18 ENCOUNTER — Encounter (HOSPITAL_COMMUNITY): Payer: Self-pay | Admitting: Family Medicine

## 2019-07-18 DIAGNOSIS — D869 Sarcoidosis, unspecified: Secondary | ICD-10-CM

## 2019-07-18 DIAGNOSIS — J984 Other disorders of lung: Secondary | ICD-10-CM

## 2019-07-18 LAB — COMPREHENSIVE METABOLIC PANEL
ALT: 41 U/L (ref 0–44)
AST: 50 U/L — ABNORMAL HIGH (ref 15–41)
Albumin: 2.4 g/dL — ABNORMAL LOW (ref 3.5–5.0)
Alkaline Phosphatase: 85 U/L (ref 38–126)
Anion gap: 9 (ref 5–15)
BUN: 19 mg/dL (ref 6–20)
CO2: 28 mmol/L (ref 22–32)
Calcium: 8.7 mg/dL — ABNORMAL LOW (ref 8.9–10.3)
Chloride: 98 mmol/L (ref 98–111)
Creatinine, Ser: 1.3 mg/dL — ABNORMAL HIGH (ref 0.61–1.24)
GFR calc Af Amer: >60 mL/min (ref >60)
GFR calc non Af Amer: >60 mL/min (ref >60)
Glucose, Bld: 119 mg/dL — ABNORMAL HIGH (ref 70–99)
Potassium: 4.2 mmol/L (ref 3.5–5.1)
Sodium: 135 mmol/L (ref 135–145)
Total Bilirubin: 0.5 mg/dL (ref 0.3–1.2)
Total Protein: 6.7 g/dL (ref 6.5–8.1)

## 2019-07-18 LAB — CBC
HCT: 39.7 % (ref 39.0–52.0)
Hemoglobin: 12.3 g/dL — ABNORMAL LOW (ref 13.0–17.0)
MCH: 21.6 pg — ABNORMAL LOW (ref 26.0–34.0)
MCHC: 31 g/dL (ref 30.0–36.0)
MCV: 69.6 fL — ABNORMAL LOW (ref 80.0–100.0)
Platelets: ADEQUATE 10*3/uL (ref 150–400)
RBC: 5.7 MIL/uL (ref 4.22–5.81)
RDW: 18.7 % — ABNORMAL HIGH (ref 11.5–15.5)
WBC: 18.5 10*3/uL — ABNORMAL HIGH (ref 4.0–10.5)
nRBC: 0 % (ref 0.0–0.2)

## 2019-07-18 LAB — URINE CULTURE: Culture: NO GROWTH

## 2019-07-18 LAB — CRYPTOCOCCAL ANTIGEN: Crypto Ag: NEGATIVE

## 2019-07-18 LAB — SEDIMENTATION RATE: Sed Rate: 75 mm/hr — ABNORMAL HIGH (ref 0–16)

## 2019-07-18 MED ORDER — TRAMADOL HCL 50 MG PO TABS
50.0000 mg | ORAL_TABLET | Freq: Four times a day (QID) | ORAL | Status: DC | PRN
  Administered 2019-07-18: 50 mg via ORAL
  Filled 2019-07-18: qty 1

## 2019-07-18 MED ORDER — OXYCODONE HCL 5 MG PO TABS
5.0000 mg | ORAL_TABLET | Freq: Four times a day (QID) | ORAL | Status: DC | PRN
  Administered 2019-07-19 – 2019-07-20 (×3): 5 mg via ORAL
  Filled 2019-07-18 (×3): qty 1

## 2019-07-18 MED ORDER — SODIUM CHLORIDE 0.9 % IV SOLN
INTRAVENOUS | Status: DC | PRN
  Administered 2019-07-18: 250 mL via INTRAVENOUS

## 2019-07-18 MED ORDER — SENNOSIDES-DOCUSATE SODIUM 8.6-50 MG PO TABS
2.0000 | ORAL_TABLET | Freq: Two times a day (BID) | ORAL | Status: DC
  Administered 2019-07-21: 2 via ORAL
  Filled 2019-07-18 (×5): qty 2

## 2019-07-18 NOTE — Progress Notes (Signed)
NAME:  Sean Sharp, MRN:  [id number], DOB:  07-Dec-1975, LOS: 2 ADMISSION DATE:  07/16/2019, CONSULTATION DATE: 07/17/2019 REFERRING MD: Dr. 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 intake for about 2 to 3 days progressive shortness of breath, cough, fevers chills diarrhea  Tested negative for Covid  Progression of his symptoms led to presentation to the emergency department   Note at baseline on plaquenil 400 mg daily, pred from 40 to 10 mg daily (very evasive when questioned re exactly what dose he's using) with good control of rash and rhinitis controlled on zyrtec daily (can really tell when he misses a dose)   Past Medical History   Past Medical History:  Diagnosis Date  . Allergic rhinitis   . 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  Hemoglobin electrophoresis normal 2016.  Marland Kitchen Chronic renal insufficiency, stage II (mild)   . Cutaneous sarcoidosis    [place] rheum: as of 02/2019->plaquenil.  Cont plaquenil and slowly tapering prednisone as of 06/2019 rheum f/u  . Hoarseness 2015   [facility] 04/2014-normal laryngoscopy: prednisone taper and bid nexium recommended.  No signif help so pt got 2nd opinion at [facility]   . HTN (hypertension)   . Hydrocele    left  . LPRD (laryngopharyngeal reflux disease)   . Obesity, Class II, BMI 35-39.9   . Olecranon bursitis of right elbow 04/29/2018   persistent/recurrent s/p aspiration-->bursa excision   . Peripheral edema    left leg>right leg  . Prediabetes 02/2017   A1c 6.3% (right when he was starting to take prednisone prn for treatment of his  sarcoidosis).  HbA1c 08/2017=6.3%.  . Pulmonary arterial hypertension (HCC)    Mild (R heart cath 06/27/19), echo 06/2019 normal.  Wt loss and CPAP recommended  . 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 Melvyn Novas suspects it was smoldering since 2011.  Skin bx of cutaneous lesion confirmed dx of sarcoidosis 02/2017.  Pt improving fall 2018 with plaquenil and prednisone.  Pt self d/c'd plaquenil, takes pred prn per his own judgement, pt commonly takes meds incorrectly/not as recommended by MDs.  . Vocal cord polyps 2015   surgery [facility]     Significant Hospital Events     Consults:  CCM 2/7  Procedures:  None  Significant Diagnostic Tests:  CT scan of the chest IMPRESSION: 1. Unfortunately, there is limited opacification of the pulmonary arteries. There is no evidence of large central pulmonary embolus in the main pulmonary artery or main portions of the right and left pulmonary arteries. However, smaller emboli in the more peripheral branches of the pulmonary arteries cannot be excluded on the basis of this exam. 2. Stable calcified adenopathy is noted consistent with history of sarcoidosis. 3. Interval development of multiple rounded ill-defined opacities throughout both lungs concerning for multifocal pneumonia or septic emboli. The largest measures 9.7 x 8.6 cm in the right lung apex. Cavitary abnormality measuring 5.0 x 4.3 cm is noted in the left lower lobe posteriorly most consistent with cavitary pneumonia. 1.9 cm cavitary abnormality is also noted in the left lower lobe.  Echo from 06/22/2019 -  No evidence of pulmonary hypertension/ confirmed on RHC 06/27/19   Anca titers 2/8 >>> ESR  2/8 >>> Micro Data:  Blood culture 2/6>> MRSA PCR 2/6 neg  Urine culture 2/6 neg  Viral resp panel 2/6 neg covid/ influenza PCT  2/6 = 2.61  Urine Strep 2/7 neg  Urine Legionnella Ag 2/7 >>> Quant Gold TB 2/8 >>> Crypto ag 2/8 >>> Blasto ag 2/8 >>> Histo ag 2/8  >>>     Antimicrobials:  Rocephin 2/6 only  Vancomycin 2/6 only  Azithromycin 2/6>> Zosyn 2/6>>   Interim history/subjective:  Says coughing a bunch but nothing productive  - no cp, no sob   Objective   Blood pressure (!) 145/76, pulse 95, temperature (!) 97.3 F (36.3 C), temperature source Oral, resp. rate 15, height _0  (1.88 m), weight (!) 138.3 kg, SpO2 94 %.        Intake/Output Summary (Last 24 hours) at 07/18/2019 1024 Last data filed at 07/18/2019 0830 Gross per 24 hour  Intake 1191.24 ml  Output 500 ml  Net 691.24 ml   Filed Weights   07/16/19 1050  Weight: (!) 138.3 kg    Examination: 2lpm sats 94% Pt alert, approp nad @ sitting in chair No jvd Oropharynx clear,  mucosa nl Neck supple Lungs with a few scattered exp > insp rhonchi bilaterally RRR no s3 or or sign murmur Abd obese with nl  excursion  Extr warm with no edema or clubbing noted Neuro  Sensorium intact,  no apparent motor deficits     Resolved Hospital Problem list     Assessment & Plan:  Multilobar pneumonia superimposed on pulmonary sarcoidosis  - cavitary dz is not likely related to sarcoid > see micro w/u plus send ANCA - ? Septic emboli > await cultures     Sepsis from multilobar pneumonia  - resolved  Sarcoidosis - prev maint on plaquenil 400 and prn pred per rheumatology  The goal with a chronic steroid dependent illness is always arriving at the lowest effective dose that controls the disease/symptoms and not accepting a set "formula" which is based on statistics or guidelines that don't always take into account patient  variability or the natural hx of the dz in every individual patient, which may well vary over time.  For now therefore I recommend the patient maintain  A ceiling of 20 mg and a floor of 10 mg daily   Acute Hypoxemic respiratory failure present on admit  - titrate for sats > 90%  - should be able to titrate off by d/c and f/u as outpt in 2 weeks  Acute  kidney injury Lab Results  Component Value Date   CREATININE 1.30 (H) 07/18/2019   CREATININE 1.23 07/17/2019   CREATININE 1.68 (H) 07/16/2019   CREATININE 1.43 (H) 02/28/2016    Probably due to sepsis/ Ct contrast > avoid further nephrotoxins           Labs   CBC: Recent Labs  Lab 07/16/19 1103 07/16/19 1825 07/17/19 0526 07/18/19 0511  WBC 23.6* 19.2* 19.3* 18.5*  NEUTROABS 20.0*  --   --   --   HGB 14.2 12.6* 13.6 12.3*  HCT 45.3 40.3 44.0 39.7  MCV 69.3* 69.6* 70.1* 69.6*  PLT 227 199 204 PLATELET CLUMPS NOTED ON SMEAR, COUNT APPEARS ADEQUATE    Basic Metabolic Panel: Recent Labs  Lab 07/16/19 1103 07/17/19 0526 07/18/19 0608  NA 134* 135 135  K 4.0 4.8 4.2  CL 94* 99 98  CO2 _0 GLUCOSE 128* 117* 119*  BUN _1 CREATININE 1.68* 1.23 1.30*  CALCIUM 8.9 8.7* 8.7*   GFR: Estimated Creatinine Clearance: 108.4 mL/min (A) (by C-G formula based on SCr of 1.3 mg/dL (H)). Recent Labs  Lab 07/16/19 1055 07/16/19 1103 07/16/19 1316 07/16/19 1555 07/16/19 1825 07/16/19 2109 07/17/19 0526 07/18/19 0511  PROCALCITON  --  2.61  --   --   --   --   --   --   WBC  --  23.6*  --   --  19.2*  --  19.3* 18.5*  LATICACIDVEN   < >  --  2.9* 2.5* 2.6* 2.3*  --   --    < > = values in this interval not displayed.    Liver Function Tests: Recent Labs  Lab 07/16/19 1103 07/17/19 0526 07/18/19 0608  AST 35 49* 50*  ALT 28 29 41  ALKPHOS 109 106 85  BILITOT 0.9 0.9 0.5  PROT 8.1 7.2 6.7  ALBUMIN 3.0* 2.4* 2.4*   No results for input(s): LIPASE, AMYLASE in the last 168 hours. No results for input(s): AMMONIA in the last 168 hours.  ABG    Component Value Date/Time   HCO3 25.9 06/27/2019 0825   TCO2 27 06/27/2019 0825   O2SAT 72.0 06/27/2019 0825     Coagulation Profile: Recent Labs  Lab 07/16/19 1103  INR 1.2    Cardiac Enzymes: No results for input(s): CKTOTAL, CKMB, CKMBINDEX, TROPONINI in the last 168 hours.  HbA1C: Hgb A1c MFr  Bld  Date/Time Value Ref Range Status  04/20/2018 10:40 AM 6.2 4.6 - 6.5 % Final    Comment:    Glycemic Control Guidelines for People with Diabetes:Non Diabetic:  <6%Goal of Therapy: <7%Additional Action Suggested:  >8%   09/04/2017 10:10 AM 6.3 4.6 - 6.5 % Final    Comment:    Glycemic Control Guidelines for People with Diabetes:Non Diabetic:  <6%Goal of Therapy: <7%Additional Action Suggested:  >8%     CBG: No results for input(s): GLUCAP in the last 168 hours.   Would continue IV abx until cultures back given concern with septic emboli.   Christinia Gully, MD Pulmonary and [place] [phone] After 5:30 PM or weekends, use Beeper [phone]

## 2019-07-18 NOTE — Progress Notes (Signed)
OT Cancellation Note  Patient Details Name: Sean Sharp MRN: [id number] DOB: 1976-01-28   Cancelled Treatment:    Reason Eval/Treat Not Completed: Other (comment)  Pt was working with PT when OT checked on pt- will check back on pt later in the day or next day  Kari Baars, [place] [phone] Office- [phone], Edwena Felty D 07/18/2019, 6:13 PM

## 2019-07-18 NOTE — Progress Notes (Signed)
PROGRESS NOTE  Sean Sharp WJX:[id number] DOB: [birth year] DOA: 07/16/2019 PCP: Tammi Sou, MD  HPI/Recap of past 24 hours: Sean Sharp is a 44 y.o. male with PMH HTN, pulmonary sarcoidosis, pulmonary HTN, obesity who presented to ED with fever, cough and SOB and found to have sepsis secondary to CAP/cavitary pneumonia.  Diagnosed with sarcoidosis about 8 years ago and has been on and off steroids since that time. Currently on a steroid taper.   In the ED: Vitals remarkable for elevated BP, tachycardia, tachypnea requiring 3L O2 and febrile. Initial Lactate 2.0. Ferritin 466, CRP 46, Na 134, Cr 1.68, WBC 23.6, Hgb 14.2, Plt 227, D-dimer 3.75, Procal 2.61, LDH 416, Fibrinogen > 800, COVID negative. Patient given Tylenol, Albuterol inhaler, Azithromycin, Rocephin and 2.6 L LR. Repeat Lactate 2.9. CXR: Acute RIGHT UPPER LOBE atelectasis and/or pneumonia superimposed upon chronic findings of sarcoidosis. CTA: Concerning for cavitary PNA, multifocal PNA.  Interval development of multiple rounded ill-defined opacities throughout both lungs concerning for multifocal pneumonia or septic emboli.  EKG: HR 110. Sinus tachycardia. Known RBBB when compared to prior. No STEMI.   07/18/19: Seen and examined.  Cough is persistent but improved.  Pulmonary following.  Ongoing work-up for cavitary lesions on CT chest.   Assessment/Plan: Principal Problem:   Sepsis due to pneumonia [facility]) Active Problems:   Allergic rhinitis   GERD   Cough   Essential hypertension   Sarcoidosis with skin and lung involvement    Morbid obesity due to excess calories ([place])   Cavitary pneumonia  Resolving sepsis secondary to multifocal community-acquired pneumonia and cavitary pneumonia in the setting of pulmonary sarcoidosis, poa Presented with worsening cough, leukocytosis, elevated procalcitonin, elevated lactic acid, tachycardia, tachypnea hypoxia Sepsis physiology is resolving, WBC is trending down, fever  overnight with T-max 100.8. Blood cultures negative to date MRSA screen negative Continue bronchodilators and pulmonary toilet Continue IV antibiotics IV azithromycin and zosyn Ongoing work-up for cavitary lesions on CT chest, will follow results Pulmonary following.  Possible septic emboli, seen on chest CT No IV drug use, no reported dental carries or wounds Blood cultures x2 - thus far.  Continue to follow cultures  Cavitary lesions on CT, r/o other causes Work up in place per pulmonary: quant gold TB, crypto ag, blasto ag, histo ag Continue to closely monitor Denies hx of TB  Pulmonary sarcoidosis with skin involvement On plaquenil and prednisone Follows with rheumatology outpatient Management here per Pulmonary, Dr. Melvyn Novas.  We appreciate assistance.         Had a 2D echo done on 06/22/2019 which showed LVEF 60 to 65% with no              evidence of PAH  Acute hypoxemic respiratory failure likely secondary to above Not on oxygen supplementation at baseline Maintain O2 saturation greater 92% Obtain Home O2 eval  AKI Baseline creatinine appears to be 1.2 with GFR greater than 60 Presented with creatinine of 1.68 with GFR 57 He is about at his baseline renal function Avoid nephrotoxins and hypotension Monitor urine output Daily BMPs  Essential hypertension BP is not at goal Continue home medications Norvasc and metoprolol Spironolactone on hold due to AKI Continue to monitor vital signs if persistently elevated will increase Toprol XL to 37.5 mg daily.  GERD Stable Continue PPI  Code Status: Full Code DVT Prophylaxis:  Subcu Lovenox daily.    Family Communication:  None at bedside.   Disposition Plan:  Patient is from home.  Anticipate discharge  to home in the next 48 to 72 hours once respiratory status is close to baseline.  Barrier to discharge: Treatment for sepsis with ongoing IV antibiotic infusion.  Objective: Vitals:   07/18/19 0759 07/18/19 0802  07/18/19 0944 07/18/19 1330  BP:    (!) 154/78  Pulse:    88  Resp: (!) _0 Temp:   (!) 97.3 F (36.3 C) 98.2 F (36.8 C)  TempSrc:   Oral Oral  SpO2:    93%  Weight:      Height:        Intake/Output Summary (Last 24 hours) at 07/18/2019 1340 Last data filed at 07/18/2019 0830 Gross per 24 hour  Intake 1191.24 ml  Output 500 ml  Net 691.24 ml   Filed Weights   07/16/19 1050  Weight: (!) 138.3 kg    Exam:  . General: 44 y.o. year-old male pleasant well-developed well-nourished no acute distress.  Alert and oriented x3.   . Cardiovascular: Regular rate and rhythm no rubs or gallops.   Marland Kitchen Respiratory: Mild diffuse rales bilaterally.  Poor inspiratory effort.  No wheezing noted.   . Abdomen: Soft nontender normal bowel sounds present.   . Musculoskeletal: Trace lower extremity edema bilaterally.   Marland Kitchen Psychiatry: Mood is appropriate for condition and setting.  Data Reviewed: CBC: Recent Labs  Lab 07/16/19 1103 07/16/19 1825 07/17/19 0526 07/18/19 0511  WBC 23.6* 19.2* 19.3* 18.5*  NEUTROABS 20.0*  --   --   --   HGB 14.2 12.6* 13.6 12.3*  HCT 45.3 40.3 44.0 39.7  MCV 69.3* 69.6* 70.1* 69.6*  PLT 227 199 204 PLATELET CLUMPS NOTED ON SMEAR, COUNT APPEARS ADEQUATE   Basic Metabolic Panel: Recent Labs  Lab 07/16/19 1103 07/17/19 0526 07/18/19 0608  NA 134* 135 135  K 4.0 4.8 4.2  CL 94* 99 98  CO2 _1 GLUCOSE 128* 117* 119*  BUN _2 CREATININE 1.68* 1.23 1.30*  CALCIUM 8.9 8.7* 8.7*   GFR: Estimated Creatinine Clearance: 108.4 mL/min (A) (by C-G formula based on SCr of 1.3 mg/dL (H)). Liver Function Tests: Recent Labs  Lab 07/16/19 1103 07/17/19 0526 07/18/19 0608  AST 35 49* 50*  ALT 28 29 41  ALKPHOS 109 106 85  BILITOT 0.9 0.9 0.5  PROT 8.1 7.2 6.7  ALBUMIN 3.0* 2.4* 2.4*   No results for input(s): LIPASE, AMYLASE in the last 168 hours. No results for input(s): AMMONIA in the last 168 hours. Coagulation Profile: Recent Labs    Lab 07/16/19 1103  INR 1.2   Cardiac Enzymes: No results for input(s): CKTOTAL, CKMB, CKMBINDEX, TROPONINI in the last 168 hours. BNP (last 3 results) No results for input(s): PROBNP in the last 8760 hours. HbA1C: No results for input(s): HGBA1C in the last 72 hours. CBG: No results for input(s): GLUCAP in the last 168 hours. Lipid Profile: Recent Labs    07/16/19 1056  TRIG 258*   Thyroid Function Tests: No results for input(s): TSH, T4TOTAL, FREET4, T3FREE, THYROIDAB in the last 72 hours. Anemia Panel: Recent Labs    07/16/19 1056  FERRITIN 466*   Urine analysis:    Component Value Date/Time   COLORURINE YELLOW 07/16/2019 1551   APPEARANCEUR CLEAR 07/16/2019 1551   LABSPEC 1.026 07/16/2019 1551   PHURINE 5.0 07/16/2019 1551   GLUCOSEU NEGATIVE 07/16/2019 1551   HGBUR NEGATIVE 07/16/2019 1551   BILIRUBINUR NEGATIVE 07/16/2019 [place] 07/16/2019 [place]  100 (A) 07/16/2019 1551   UROBILINOGEN 1.0 11/03/2013 1531   NITRITE NEGATIVE 07/16/2019 1551   LEUKOCYTESUR NEGATIVE 07/16/2019 1551   Sepsis Labs: _0 (procalcitonin:4,lacticidven:4)  ) Recent Results (from the past 240 hour(s))  Novel Coronavirus, NAA (Hosp order, Send-out to Ref Lab; TAT 18-24 hrs     Status: None   Collection Time: 07/13/19  8:34 PM   Specimen: Nasopharyngeal Swab; Respiratory  Result Value Ref Range Status   SARS-CoV-2, NAA NOT DETECTED NOT DETECTED Final    Comment: (NOTE) This nucleic acid amplification test was developed and its performance characteristics determined by [employer] and [employer]. Nucleic acid amplification tests include RT-PCR and TMA. This test has not been FDA cleared or approved. This test has been authorized by FDA under an Emergency Use Authorization (EUA). This test is only authorized for the duration of time the declaration that circumstances exist justifying the authorization of the emergency use of in vitro diagnostic tests for  detection of SARS-CoV-2 virus and/or diagnosis of COVID-19 infection under section 564(b)(1) of the Act, 21 U.S.C. 811BJY-7(W) (1), unless the authorization is terminated or revoked sooner. When diagnostic testing is negative, the possibility of a false negative result should be considered in the context of a patient's recent exposures and the presence of clinical signs and symptoms consistent with COVID-19. An individual without symptoms of COVID- 19 and who is not shedding SARS-CoV-2  virus would expect to have a negative (not detected) result in this assay. Performed At: [facility] [address] [place], [place] [id number] Rush Farmer MD MV:[id number]    [place]  Final    Comment: Performed at [facility], [place] [address] [place], [place] [address]  Blood Culture (routine x 2)     Status: None (Preliminary result)   Collection Time: 07/16/19 11:03 AM   Specimen: BLOOD  Result Value Ref Range Status   Specimen Description BLOOD RIGHT ANTECUBITAL  Final   Special Requests   Final    BAA Blood Culture results may not be optimal due to an excessive volume of blood received in culture bottles Performed at [facility], [place] [address] [place], [place] [address]    Culture NO GROWTH < 24 HOURS  Final   Report Status PENDING  Incomplete  Respiratory Panel by RT PCR (Flu A&B, Covid) - Nasopharyngeal Swab     Status: None   Collection Time: 07/16/19 11:03 AM   Specimen: Nasopharyngeal Swab  Result Value Ref Range Status   SARS Coronavirus 2 by RT PCR NEGATIVE NEGATIVE Final    Comment: (NOTE) SARS-CoV-2 target nucleic acids are NOT DETECTED. The SARS-CoV-2 RNA is generally detectable in upper respiratoy specimens during the acute phase of infection. The lowest concentration of SARS-CoV-2 viral copies this assay can detect is 131 copies/mL. A negative result does not preclude SARS-Cov-2 infection and should not be used  as the sole basis for treatment or other patient management decisions. A negative result may occur with  improper specimen collection/handling, submission of specimen other than nasopharyngeal swab, presence of viral mutation(s) within the areas targeted by this assay, and inadequate number of viral copies (<131 copies/mL). A negative result must be combined with clinical observations, patient history, and epidemiological information. The expected result is Negative. Fact Sheet for Patients:  PinkCheek.be Fact Sheet for Healthcare Providers:  GravelBags.it This test is not yet ap proved or cleared by the [place] FDA and  has been authorized for detection and/or diagnosis of SARS-CoV-2 by FDA  under an Emergency Use Authorization (EUA). This EUA will remain  in effect (meaning this test can be used) for the duration of the COVID-19 declaration under Section 564(b)(1) of the Act, 21 U.S.C. section 360bbb-3(b)(1), unless the authorization is terminated or revoked sooner.    Influenza A by PCR NEGATIVE NEGATIVE Final   Influenza B by PCR NEGATIVE NEGATIVE Final    Comment: (NOTE) The Xpert Xpress SARS-CoV-2/FLU/RSV assay is intended as an aid in  the diagnosis of influenza from Nasopharyngeal swab specimens and  should not be used as a sole basis for treatment. Nasal washings and  aspirates are unacceptable for Xpert Xpress SARS-CoV-2/FLU/RSV  testing. Fact Sheet for Patients: PinkCheek.be Fact Sheet for Healthcare Providers: GravelBags.it This test is not yet approved or cleared by the [place] FDA and  has been authorized for detection and/or diagnosis of SARS-CoV-2 by  FDA under an Emergency Use Authorization (EUA). This EUA will remain  in effect (meaning this test can be used) for the duration of the  Covid-19 declaration under Section 564(b)(1) of the Act,  21  U.S.C. section 360bbb-3(b)(1), unless the authorization is  terminated or revoked. Performed at [facility], [place] [address] [place], [place] [address]   Blood Culture (routine x 2)     Status: None (Preliminary result)   Collection Time: 07/16/19 11:04 AM   Specimen: BLOOD LEFT HAND  Result Value Ref Range Status   Specimen Description BLOOD LEFT HAND  Final   Special Requests   Final    AEB Blood Culture results may not be optimal due to an inadequate volume of blood received in culture bottles Performed at [facility], [place] [address] [place], [place] [address]    Culture NO GROWTH < 24 HOURS  Final   Report Status PENDING  Incomplete  MRSA PCR Screening     Status: None   Collection Time: 07/16/19  3:04 PM   Specimen: Nasal Mucosa; Nasopharyngeal  Result Value Ref Range Status   MRSA by PCR NEGATIVE NEGATIVE Final    Comment:        The GeneXpert MRSA Assay (FDA approved for NASAL specimens only), is one component of a comprehensive MRSA colonization surveillance program. It is not intended to diagnose MRSA infection nor to guide or monitor treatment for MRSA infections. Performed at [facility] [place], [place] [address], [place], [place] [address]   Urine culture     Status: None   Collection Time: 07/16/19  3:51 PM   Specimen: In/Out Cath Urine  Result Value Ref Range Status   Specimen Description IN/OUT CATH URINE  Final   Special Requests   Final    NONE Performed at [place] [address] [place], [place] [address]    Culture NO GROWTH  Final   Report Status 07/18/2019 FINAL  Final      Studies: No results found.  Scheduled Meds: . amLODipine  10 mg Oral Daily  . chlorpheniramine-HYDROcodone  5 mL Oral Q12H  . enoxaparin (LOVENOX) injection  70 mg Subcutaneous Q24H  . hydroxychloroquine  400 mg Oral Daily  . ipratropium  2 puff Inhalation Q6H  . levalbuterol  2 puff  Inhalation Q6H  . metoprolol succinate  25 mg Oral Daily  . pantoprazole  40 mg Oral QAC breakfast  . predniSONE  30 mg Oral Q breakfast  . senna-docusate  2 tablet Oral BID    Continuous Infusions: . azithromycin Stopped (07/17/19 1105)  .  piperacillin-tazobactam (ZOSYN)  IV 3.375 g (07/18/19 0930)     LOS: 2 days     Kayleen Memos, MD [facility] Pager [phone]  If 7PM-7AM, please contact night-coverage [email or web address] Password [email or web address] 07/18/2019, 1:40 PM

## 2019-07-18 NOTE — Evaluation (Signed)
Physical Therapy Evaluation Patient Details Name: Sean Sharp MRN: [id number] DOB: Mar 28, 1976 Today's Date: 07/18/2019   History of Present Illness  44 yo male admitted with Pna, sepsis. Hx of sarcoidosis, obesity  Clinical Impression  On eval, pt was Min guard-Min assist for mobility. He walked ~250 feet around the unit without a device. Mildly unsteady at time. He denied dizziness. O2 87% on RA at rest, 91% on 2L [place] during ambulation. Will continue to follow and progress activity as tolerated.     Follow Up Recommendations No PT follow up    Equipment Recommendations  None recommended by PT    Recommendations for Other Services       Precautions / Restrictions Precautions Precautions: Fall Precaution Comments: monitor O2 Restrictions Weight Bearing Restrictions: No      Mobility  Bed Mobility               General bed mobility comments: oob in recliner  Transfers Overall transfer level: Needs assistance   Transfers: Sit to/from Stand Sit to Stand: Min guard         General transfer comment: for safety. mildly unsteady.  Ambulation/Gait Ambulation/Gait assistance: Min guard;Min assist Gait Distance (Feet): 250 Feet Assistive device: None Gait Pattern/deviations: Step-through pattern;Decreased stride length     General Gait Details: Increased lateral swaying/unsteadiness intermittently. Pt denied dizziness. O2 91% on 2L [place] O2.  Stairs            Wheelchair Mobility    Modified Rankin (Stroke Patients Only)       Balance Overall balance assessment: Mild deficits observed, not formally tested                                           Pertinent Vitals/Pain Pain Assessment: 0-10 Pain Score: 7  Pain Location: back Pain Descriptors / Indicators: Discomfort;Sore;Aching Pain Intervention(s): Monitored during session    Home Living Family/patient expects to be discharged to:: Private residence Living Arrangements:  Spouse/significant other   Type of Home: House Home Access: Stairs to enter   Technical brewer of Steps: 1-2   Home Equipment: None      Prior Function Level of Independence: Independent         Comments: Clinical biochemist by Dietitian        Extremity/Trunk Assessment   Upper Extremity Assessment Upper Extremity Assessment: Overall WFL for tasks assessed    Lower Extremity Assessment Lower Extremity Assessment: Overall WFL for tasks assessed    Cervical / Trunk Assessment Cervical / Trunk Assessment: Normal  Communication   Communication: No difficulties  Cognition Arousal/Alertness: Awake/alert Behavior During Therapy: WFL for tasks assessed/performed Overall Cognitive Status: Within Functional Limits for tasks assessed                                        General Comments      Exercises     Assessment/Plan    PT Assessment Patient needs continued PT services  PT Problem List Decreased activity tolerance;Decreased mobility;Decreased balance       PT Treatment Interventions Gait training;Therapeutic activities;Therapeutic exercise;Patient/family education;Functional mobility training;Balance training    PT Goals (Current goals can be found in the Care Plan section)  Acute Rehab PT Goals Patient Stated Goal: less back pain. regain  PLOF PT Goal Formulation: With patient Time For Goal Achievement: 08/01/19 Potential to Achieve Goals: Good    Frequency Min 3X/week   Barriers to discharge        Co-evaluation               AM-PAC PT "6 Clicks" Mobility  Outcome Measure Help needed turning from your back to your side while in a flat bed without using bedrails?: None Help needed moving from lying on your back to sitting on the side of a flat bed without using bedrails?: None Help needed moving to and from a bed to a chair (including a wheelchair)?: None Help needed standing up from a chair using your arms  (e.g., wheelchair or bedside chair)?: A Little Help needed to walk in hospital room?: A Little Help needed climbing 3-5 steps with a railing? : A Little 6 Click Score: 21    End of Session Equipment Utilized During Treatment: Gait belt;Oxygen Activity Tolerance: Patient tolerated treatment well Patient left: in chair;with call bell/phone within reach   PT Visit Diagnosis: Difficulty in walking, not elsewhere classified (R26.2)    Time: 1188-6773 PT Time Calculation (min) (ACUTE ONLY): 26 min   Charges:   PT Evaluation $PT Eval Moderate Complexity: 1 Mod PT Treatments $Gait Training: 8-22 mins           Doreatha Massed, PT Acute Rehabilitation

## 2019-07-19 LAB — CBC WITH DIFFERENTIAL/PLATELET
Abs Immature Granulocytes: 2.12 10*3/uL — ABNORMAL HIGH (ref 0.00–0.07)
Basophils Absolute: 0 10*3/uL (ref 0.0–0.1)
Basophils Relative: 0 %
Eosinophils Absolute: 0.1 10*3/uL (ref 0.0–0.5)
Eosinophils Relative: 0 %
HCT: 45 % (ref 39.0–52.0)
Hemoglobin: 13.2 g/dL (ref 13.0–17.0)
Immature Granulocytes: 11 %
Lymphocytes Relative: 4 %
Lymphs Abs: 0.7 10*3/uL (ref 0.7–4.0)
MCH: 21.2 pg — ABNORMAL LOW (ref 26.0–34.0)
MCHC: 29.3 g/dL — ABNORMAL LOW (ref 30.0–36.0)
MCV: 72.1 fL — ABNORMAL LOW (ref 80.0–100.0)
Monocytes Absolute: 1 10*3/uL (ref 0.1–1.0)
Monocytes Relative: 6 %
Neutro Abs: 14.9 10*3/uL — ABNORMAL HIGH (ref 1.7–7.7)
Neutrophils Relative %: 79 %
Platelets: 241 10*3/uL (ref 150–400)
RBC: 6.24 MIL/uL — ABNORMAL HIGH (ref 4.22–5.81)
RDW: 19.2 % — ABNORMAL HIGH (ref 11.5–15.5)
WBC: 18.8 10*3/uL — ABNORMAL HIGH (ref 4.0–10.5)
nRBC: 0 % (ref 0.0–0.2)

## 2019-07-19 LAB — LEGIONELLA PNEUMOPHILA SEROGP 1 UR AG
L. pneumophila Serogp 1 Ur Ag: POSITIVE — AB
L. pneumophila Serogp 1 Ur Ag: POSITIVE — AB

## 2019-07-19 LAB — PROCALCITONIN: Procalcitonin: 1.23 ng/mL

## 2019-07-19 LAB — BASIC METABOLIC PANEL WITH GFR
Anion gap: 14 (ref 5–15)
BUN: 19 mg/dL (ref 6–20)
CO2: 23 mmol/L (ref 22–32)
Calcium: 8.8 mg/dL — ABNORMAL LOW (ref 8.9–10.3)
Chloride: 101 mmol/L (ref 98–111)
Creatinine, Ser: 1.38 mg/dL — ABNORMAL HIGH (ref 0.61–1.24)
GFR calc Af Amer: >60 mL/min (ref >60)
GFR calc non Af Amer: >60 mL/min (ref >60)
Glucose, Bld: 93 mg/dL (ref 70–99)
Potassium: 4.4 mmol/L (ref 3.5–5.1)
Sodium: 138 mmol/L (ref 135–145)

## 2019-07-19 MED ORDER — METOPROLOL SUCCINATE ER 25 MG PO TB24
37.5000 mg | ORAL_TABLET | Freq: Every day | ORAL | Status: DC
  Administered 2019-07-20 – 2019-07-21 (×2): 37.5 mg via ORAL
  Filled 2019-07-19 (×2): qty 2

## 2019-07-19 MED ORDER — LEVALBUTEROL TARTRATE 45 MCG/ACT IN AERO
2.0000 | INHALATION_SPRAY | Freq: Three times a day (TID) | RESPIRATORY_TRACT | Status: DC
  Administered 2019-07-19 – 2019-07-21 (×7): 2 via RESPIRATORY_TRACT

## 2019-07-19 MED ORDER — IPRATROPIUM BROMIDE HFA 17 MCG/ACT IN AERS
2.0000 | INHALATION_SPRAY | Freq: Three times a day (TID) | RESPIRATORY_TRACT | Status: DC
  Administered 2019-07-19 – 2019-07-21 (×7): 2 via RESPIRATORY_TRACT

## 2019-07-19 MED ORDER — LEVALBUTEROL TARTRATE 45 MCG/ACT IN AERO
2.0000 | INHALATION_SPRAY | Freq: Four times a day (QID) | RESPIRATORY_TRACT | Status: DC | PRN
  Filled 2019-07-19: qty 15

## 2019-07-19 MED ORDER — LEVOFLOXACIN IN D5W 750 MG/150ML IV SOLN
750.0000 mg | INTRAVENOUS | Status: DC
  Administered 2019-07-20 – 2019-07-21 (×2): 750 mg via INTRAVENOUS
  Filled 2019-07-19 (×2): qty 150

## 2019-07-19 MED ORDER — SIMETHICONE 80 MG PO CHEW
80.0000 mg | CHEWABLE_TABLET | Freq: Four times a day (QID) | ORAL | Status: DC | PRN
  Administered 2019-07-19 (×2): 80 mg via ORAL
  Filled 2019-07-19 (×2): qty 1

## 2019-07-19 MED ORDER — METOPROLOL SUCCINATE ER 25 MG PO TB24
12.5000 mg | ORAL_TABLET | Freq: Once | ORAL | Status: AC
  Administered 2019-07-19: 12.5 mg via ORAL
  Filled 2019-07-19: qty 1

## 2019-07-19 NOTE — Progress Notes (Signed)
Physical Therapy Treatment Patient Details Name: Sean Sharp MRN: [id number] DOB: 24-Apr-1976 Today's Date: 07/19/2019    History of Present Illness 44 yo male admitted with Pna, sepsis. Hx of sarcoidosis, obesity    PT Comments    Pt continues to participate well   Follow Up Recommendations  No PT follow up     Equipment Recommendations  None recommended by PT    Recommendations for Other Services       Precautions / Restrictions Precautions Precaution Comments: monitor O2 Restrictions Weight Bearing Restrictions: No    Mobility  Bed Mobility               General bed mobility comments: oob in recliner  Transfers Overall transfer level: Needs assistance   Transfers: Sit to/from Stand Sit to Stand: Supervision            Ambulation/Gait Ambulation/Gait assistance: Min guard Gait Distance (Feet): 250 Feet Assistive device: None Gait Pattern/deviations: Step-through pattern     General Gait Details: Less unsteadiness today compared to yesterday. Pt denies dizziness. O2 95% on 3L   Stairs             Wheelchair Mobility    Modified Rankin (Stroke Patients Only)       Balance Overall balance assessment: Mild deficits observed, not formally tested                                          Cognition Arousal/Alertness: Awake/alert Behavior During Therapy: WFL for tasks assessed/performed Overall Cognitive Status: Within Functional Limits for tasks assessed                                        Exercises      General Comments        Pertinent Vitals/Pain Pain Assessment: Faces Faces Pain Scale: Hurts little more Pain Location: chest Pain Descriptors / Indicators: Sore;Discomfort Pain Intervention(s): Monitored during session    Home Living                      Prior Function            PT Goals (current goals can now be found in the care plan section) Progress towards PT  goals: Progressing toward goals    Frequency    Min 3X/week      PT Plan Current plan remains appropriate    Co-evaluation              AM-PAC PT "6 Clicks" Mobility   Outcome Measure  Help needed turning from your back to your side while in a flat bed without using bedrails?: None Help needed moving from lying on your back to sitting on the side of a flat bed without using bedrails?: None Help needed moving to and from a bed to a chair (including a wheelchair)?: None Help needed standing up from a chair using your arms (e.g., wheelchair or bedside chair)?: None Help needed to walk in hospital room?: A Little Help needed climbing 3-5 steps with a railing? : A Little 6 Click Score: 22    End of Session Equipment Utilized During Treatment: Gait belt;Oxygen Activity Tolerance: Patient tolerated treatment well Patient left: in chair;with call bell/phone within reach   PT Visit Diagnosis: Difficulty in walking,  not elsewhere classified (R26.2)     Time: 1410-1443 PT Time Calculation (min) (ACUTE ONLY): 33 min  Charges:  $Gait Training: 23-37 mins                         Doreatha Massed, PT Acute Rehabilitation

## 2019-07-19 NOTE — Progress Notes (Signed)
PROGRESS NOTE  Sean Sharp LTJ:[id number] DOB: 1976-02-06 DOA: 07/16/2019 PCP: Tammi Sou, MD  HPI/Recap of past 24 hours: Sean Sharp is a 44 y.o. male with PMH HTN, pulmonary sarcoidosis, pulmonary HTN, obesity who presented to ED with fever, cough and SOB and found to have sepsis secondary to CAP/cavitary pneumonia.  Diagnosed with sarcoidosis about 8 years ago and has been on and off steroids since that time. Currently on a steroid taper.   In the ED: Vitals remarkable for elevated BP, tachycardia, tachypnea requiring 3L O2 and febrile. Initial Lactate 2.0. Ferritin 466, CRP 46, Na 134, Cr 1.68, WBC 23.6, Hgb 14.2, Plt 227, D-dimer 3.75, Procal 2.61, LDH 416, Fibrinogen > 800, COVID negative. Patient given Tylenol, Albuterol inhaler, Azithromycin, Rocephin and 2.6 L LR. Repeat Lactate 2.9. CXR: Acute RIGHT UPPER LOBE atelectasis and/or pneumonia superimposed upon chronic findings of sarcoidosis. CTA: Concerning for cavitary PNA, multifocal PNA.  Interval development of multiple rounded ill-defined opacities throughout both lungs concerning for multifocal pneumonia or septic emboli.  EKG: HR 110. Sinus tachycardia. Known RBBB when compared to prior. No STEMI.   07/19/19: Seen and examined.  Reports he feels better.  However spiked a temperature with T-max 101.6.  Blood cultures negative to date.    Assessment/Plan: Principal Problem:   Sepsis due to pneumonia [facility]) Active Problems:   Allergic rhinitis   GERD   Cough   Essential hypertension   Sarcoidosis with skin and lung involvement    Morbid obesity due to excess calories ([place])   Cavitary pneumonia  Sepsis secondary to multifocal community-acquired pneumonia and cavitary pneumonia in the setting of pulmonary sarcoidosis, poa Presented with worsening cough, leukocytosis, elevated procalcitonin, elevated lactic acid, tachycardia, tachypnea, and acute hypoxia WBC is trending down, fever overnight with T-max  101.6. Blood cultures negative to date MRSA screen negative Continue bronchodilators and pulmonary toilet Continue IV antibiotics IV azithromycin and zosyn day #3. Ongoing work-up for cavitary lesions on CT chest, will follow results Pulmonary following.  Possible septic emboli, seen on chest CT No IV drug use, no reported dental carries or wounds Blood cultures x2 -to date.  Continue to follow cultures  Cavitary lesions on CT, r/o other causes Work up in place per pulmonary: quant gold TB, crypto ag, blasto ag, histo ag Continue to closely monitor Denies hx of TB  Pulmonary sarcoidosis with skin involvement On plaquenil and prednisone Follows with rheumatology outpatient Management here per Pulmonary, Dr. Melvyn Novas.  We appreciate assistance.         Had a 2D echo done on 06/22/2019 which showed LVEF 60 to 65% with no              evidence of PAH  Acute hypoxemic respiratory failure likely secondary to above Not on oxygen supplementation at baseline Maintain O2 saturation greater 92% Obtain Home O2 eval prior to DC  AKI Baseline creatinine appears to be 1.2 with GFR greater than 60 Presented with creatinine of 1.68 with GFR 57 He is about at his baseline renal function Creatinine 1.38 with GFR greater than 60 Continue to avoid nephrotoxins and hypotension Continue to monitor urine output Daily BMPs  Essential hypertension BP is not at goal Continue home medications Norvasc and metoprolol Spironolactone on hold due to AKI Continue to monitor vital signs if persistently elevated will increase Toprol XL to 37.5 mg daily.  GERD Stable Continue PPI  Code Status: Full Code DVT Prophylaxis:  Subcu Lovenox daily.    Family Communication:  None  at bedside.   Disposition Plan:  Patient is from home.  Anticipate discharge to home in the next 48 to 72 hours once respiratory status is close to baseline.  Barrier to discharge: Treatment for sepsis with ongoing IV antibiotic  infusion.  Objective: Vitals:   07/19/19 0945 07/19/19 1009 07/19/19 1420 07/19/19 1445  BP:   (!) 161/81   Pulse:   92   Resp:   18   Temp:   98.3 F (36.8 C)   TempSrc:   Oral   SpO2: (!) 88% 93% 95% (!) 88%  Weight:      Height:        Intake/Output Summary (Last 24 hours) at 07/19/2019 1625 Last data filed at 07/19/2019 1500 Gross per 24 hour  Intake 1172.72 ml  Output --  Net 1172.72 ml   Filed Weights   07/16/19 1050  Weight: (!) 138.3 kg    Exam:  . General: 44 y.o. year-old male well-developed well-nourished no acute distress.  Alert and oriented x3.   . Cardiovascular: Regular rate and rhythm no rubs or gallops.   Marland Kitchen Respiratory: Mild diffuse rales bilaterally.  Good inspiratory effort.   . Abdomen: Obese nontender normal bowel sounds present.   . Musculoskeletal: Trace lower extremity edema bilaterally.   Marland Kitchen Psychiatry: Mood is appropriate for condition and setting.  Data Reviewed: CBC: Recent Labs  Lab 07/16/19 1103 07/16/19 1825 07/17/19 0526 07/18/19 0511 07/19/19 0525  WBC 23.6* 19.2* 19.3* 18.5* 18.8*  NEUTROABS 20.0*  --   --   --  14.9*  HGB 14.2 12.6* 13.6 12.3* 13.2  HCT 45.3 40.3 44.0 39.7 45.0  MCV 69.3* 69.6* 70.1* 69.6* 72.1*  PLT 227 199 204 PLATELET CLUMPS NOTED ON SMEAR, COUNT APPEARS ADEQUATE 169   Basic Metabolic Panel: Recent Labs  Lab 07/16/19 1103 07/17/19 0526 07/18/19 0608 07/19/19 0525  NA 134* 135 135 138  K 4.0 4.8 4.2 4.4  CL 94* 99 98 101  CO2 _0 GLUCOSE 128* 117* 119* 93  BUN _1 CREATININE 1.68* 1.23 1.30* 1.38*  CALCIUM 8.9 8.7* 8.7* 8.8*   GFR: Estimated Creatinine Clearance: 102.1 mL/min (A) (by C-G formula based on SCr of 1.38 mg/dL (H)). Liver Function Tests: Recent Labs  Lab 07/16/19 1103 07/17/19 0526 07/18/19 0608  AST 35 49* 50*  ALT 28 29 41  ALKPHOS 109 106 85  BILITOT 0.9 0.9 0.5  PROT 8.1 7.2 6.7  ALBUMIN 3.0* 2.4* 2.4*   No results for input(s): LIPASE, AMYLASE in the  last 168 hours. No results for input(s): AMMONIA in the last 168 hours. Coagulation Profile: Recent Labs  Lab 07/16/19 1103  INR 1.2   Cardiac Enzymes: No results for input(s): CKTOTAL, CKMB, CKMBINDEX, TROPONINI in the last 168 hours. BNP (last 3 results) No results for input(s): PROBNP in the last 8760 hours. HbA1C: No results for input(s): HGBA1C in the last 72 hours. CBG: No results for input(s): GLUCAP in the last 168 hours. Lipid Profile: No results for input(s): CHOL, HDL, LDLCALC, TRIG, CHOLHDL, LDLDIRECT in the last 72 hours. Thyroid Function Tests: No results for input(s): TSH, T4TOTAL, FREET4, T3FREE, THYROIDAB in the last 72 hours. Anemia Panel: No results for input(s): VITAMINB12, FOLATE, FERRITIN, TIBC, IRON, RETICCTPCT in the last 72 hours. Urine analysis:    Component Value Date/Time   COLORURINE YELLOW 07/16/2019 1551   APPEARANCEUR CLEAR 07/16/2019 1551   LABSPEC 1.026 07/16/2019 1551   PHURINE 5.0 07/16/2019  [place] 07/16/2019 [place] 07/16/2019 [place] 07/16/2019 1551   KETONESUR NEGATIVE 07/16/2019 1551   PROTEINUR 100 (A) 07/16/2019 1551   UROBILINOGEN 1.0 11/03/2013 1531   NITRITE NEGATIVE 07/16/2019 1551   LEUKOCYTESUR NEGATIVE 07/16/2019 1551   Sepsis Labs: _0 (procalcitonin:4,lacticidven:4)  ) Recent Results (from the past 240 hour(s))  Novel Coronavirus, NAA (Hosp order, Send-out to Ref Lab; TAT 18-24 hrs     Status: None   Collection Time: 07/13/19  8:34 PM   Specimen: Nasopharyngeal Swab; Respiratory  Result Value Ref Range Status   SARS-CoV-2, NAA NOT DETECTED NOT DETECTED Final    Comment: (NOTE) This nucleic acid amplification test was developed and its performance characteristics determined by [employer]. Nucleic acid amplification tests include RT-PCR and TMA. This test has not been FDA cleared or approved. This test has been authorized by FDA under an Emergency  Use Authorization (EUA). This test is only authorized for the duration of time the declaration that circumstances exist justifying the authorization of the emergency use of in vitro diagnostic tests for detection of SARS-CoV-2 virus and/or diagnosis of COVID-19 infection under section 564(b)(1) of the Act, 21 U.S.C. 426STM-1(D) (1), unless the authorization is terminated or revoked sooner. When diagnostic testing is negative, the possibility of a false negative result should be considered in the context of a patient's recent exposures and the presence of clinical signs and symptoms consistent with COVID-19. An individual without symptoms of COVID- 19 and who is not shedding SARS-CoV-2  virus would expect to have a negative (not detected) result in this assay. Performed At: [facility] [address] [place], [place] [id number] Rush Farmer MD QJ:[id number]    [place]  Final    Comment: Performed at [facility], [place] [address], [place], [place] [address]  Blood Culture (routine x 2)     Status: None (Preliminary result)   Collection Time: 07/16/19 11:03 AM   Specimen: BLOOD  Result Value Ref Range Status   Specimen Description   Final    BLOOD RIGHT ANTECUBITAL Performed at [place] [address], [place], [place] [address]    Special Requests   Final    BAA Blood Culture results may not be optimal due to an excessive volume of blood received in culture bottles Performed at [place] [address], [place], [place] [address]    Culture   Final    NO GROWTH 3 DAYS Performed at [facility], [place] [address] [place],  [address]    Report Status PENDING  Incomplete  Respiratory Panel by RT PCR (Flu A&B, Covid) - Nasopharyngeal Swab     Status: None   Collection Time: 07/16/19 11:03 AM   Specimen: Nasopharyngeal Swab  Result Value Ref Range Status   SARS Coronavirus 2 by RT PCR  NEGATIVE NEGATIVE Final    Comment: (NOTE) SARS-CoV-2 target nucleic acids are NOT DETECTED. The SARS-CoV-2 RNA is generally detectable in upper respiratoy specimens during the acute phase of infection. The lowest concentration of SARS-CoV-2 viral copies this assay can detect is 131 copies/mL. A negative result does not preclude SARS-Cov-2 infection and should not be used as the sole basis for treatment or other patient management decisions. A negative result may occur with  improper specimen collection/handling, submission of specimen other than nasopharyngeal swab, presence of viral mutation(s) within the areas targeted by this assay, and inadequate number of  viral copies (<131 copies/mL). A negative result must be combined with clinical observations, patient history, and epidemiological information. The expected result is Negative. Fact Sheet for Patients:  PinkCheek.be Fact Sheet for Healthcare Providers:  GravelBags.it This test is not yet ap proved or cleared by the [place] FDA and  has been authorized for detection and/or diagnosis of SARS-CoV-2 by FDA under an Emergency Use Authorization (EUA). This EUA will remain  in effect (meaning this test can be used) for the duration of the COVID-19 declaration under Section 564(b)(1) of the Act, 21 U.S.C. section 360bbb-3(b)(1), unless the authorization is terminated or revoked sooner.    Influenza A by PCR NEGATIVE NEGATIVE Final   Influenza B by PCR NEGATIVE NEGATIVE Final    Comment: (NOTE) The Xpert Xpress SARS-CoV-2/FLU/RSV assay is intended as an aid in  the diagnosis of influenza from Nasopharyngeal swab specimens and  should not be used as a sole basis for treatment. Nasal washings and  aspirates are unacceptable for Xpert Xpress SARS-CoV-2/FLU/RSV  testing. Fact Sheet for Patients: PinkCheek.be Fact Sheet for Healthcare  Providers: GravelBags.it This test is not yet approved or cleared by the [place] FDA and  has been authorized for detection and/or diagnosis of SARS-CoV-2 by  FDA under an Emergency Use Authorization (EUA). This EUA will remain  in effect (meaning this test can be used) for the duration of the  Covid-19 declaration under Section 564(b)(1) of the Act, 21  U.S.C. section 360bbb-3(b)(1), unless the authorization is  terminated or revoked. Performed at [facility], [place] [address], [place], [place] [address]   Blood Culture (routine x 2)     Status: None (Preliminary result)   Collection Time: 07/16/19 11:04 AM   Specimen: BLOOD LEFT HAND  Result Value Ref Range Status   Specimen Description   Final    BLOOD LEFT HAND Performed at [facility] [place] [address], [place], [place] [address]    Special Requests   Final    AEB Blood Culture results may not be optimal due to an inadequate volume of blood received in culture bottles Performed at [place] [address] [place], [place] [address]    Culture   Final    NO GROWTH 3 DAYS Performed at [facility], [place] [address], [place], [place] [address]    Report Status PENDING  Incomplete  MRSA PCR Screening     Status: None   Collection Time: 07/16/19  3:04 PM   Specimen: Nasal Mucosa; Nasopharyngeal  Result Value Ref Range Status   MRSA by PCR NEGATIVE NEGATIVE Final    Comment:        The GeneXpert MRSA Assay (FDA approved for NASAL specimens only), is one component of a comprehensive MRSA colonization surveillance program. It is not intended to diagnose MRSA infection nor to guide or monitor treatment for MRSA infections. Performed at [facility], [place] [address], [place], [place] [address]   Urine culture     Status: None   Collection Time: 07/16/19  3:51 PM   Specimen: In/Out Cath Urine  Result Value Ref  Range Status   Specimen Description IN/OUT CATH URINE  Final   Special Requests   Final    NONE Performed at [facility] [address], [place], [place] [address]    Culture NO GROWTH  Final   Report Status 07/18/2019 FINAL  Final      Studies: No results found.  Scheduled Meds: . amLODipine  10 mg Oral Daily  . enoxaparin (LOVENOX) injection  70 mg Subcutaneous Q24H  . hydroxychloroquine  400 mg Oral Daily  . ipratropium  2 puff Inhalation TID  . levalbuterol  2 puff Inhalation TID  . metoprolol succinate  25 mg Oral Daily  . pantoprazole  40 mg Oral QAC breakfast  . predniSONE  30 mg Oral Q breakfast  . senna-docusate  2 tablet Oral BID    Continuous Infusions: . sodium chloride 250 mL (07/18/19 1413)  . [START ON 07/20/2019] levofloxacin (LEVAQUIN) IV       LOS: 3 days     Kayleen Memos, MD [facility] Pager [phone]  If 7PM-7AM, please contact night-coverage [email or web address] Password [facility] 07/19/2019, 4:25 PM

## 2019-07-19 NOTE — TOC Initial Note (Signed)
Transition of Care [facility]) - Initial/Assessment Note    Patient Details  Name: Sean Sharp MRN: [id number] Date of Birth: August 03, 1975  [facility]) CM/SW Contact:    Dessa Phi, RN Phone Number: 07/19/2019, 1:46 PM  Clinical Narrative:  Patient plans to d/c home. CM referral for CPAP machine-would need otpt sleep study to start-[employer] rep Thedore Mins can provide home sleep study materials.Noted on 02 will monitor.                 Expected Discharge Plan: Home/Self Care Barriers to Discharge: Continued Medical Work up   Patient Goals and CMS Choice Patient states their goals for this hospitalization and ongoing recovery are:: go home      Expected Discharge Plan and Services Expected Discharge Plan: Home/Self Care   Discharge Planning Services: CM Consult   Living arrangements for the past 2 months: Single Family Home                                      Prior Living Arrangements/Services Living arrangements for the past 2 months: Single Family Home Lives with:: Spouse Patient language and need for interpreter reviewed:: Yes Do you feel safe going back to the place where you live?: Yes      Need for Family Participation in Patient Care: No (Comment) Care giver support system in place?: Yes (comment)   Criminal Activity/Legal Involvement Pertinent to Current Situation/Hospitalization: No - Comment as needed  Activities of Daily Living Home Assistive Devices/Equipment: None ADL Screening (condition at time of admission) Patient's cognitive ability adequate to safely complete daily activities?: Yes Is the patient deaf or have difficulty hearing?: No Does the patient have difficulty seeing, even when wearing glasses/contacts?: No Does the patient have difficulty concentrating, remembering, or making decisions?: No Patient able to express need for assistance with ADLs?: Yes Does the patient have difficulty dressing or bathing?: No Independently  performs ADLs?: Yes (appropriate for developmental age) Does the patient have difficulty walking or climbing stairs?: Yes Weakness of Legs: Both Weakness of Arms/Hands: None  Permission Sought/Granted Permission sought to share information with : Case Manager Permission granted to share information with : Yes, Verbal Permission Granted  Share Information with NAME: Case Manager           Emotional Assessment Appearance:: Appears stated age Attitude/Demeanor/Rapport: Gracious Affect (typically observed): Accepting Orientation: : Oriented to Self, Oriented to Place, Oriented to  Time, Oriented to Situation Alcohol / Substance Use: Not Applicable Psych Involvement: No (comment)  Admission diagnosis:  Acute respiratory failure with hypoxia ([employer]) [J96.01] Cavitary pneumonia [J18.9, J98.4] Sepsis due to pneumonia ([place]) [J18.9, A41.9] Community acquired pneumonia of right upper lobe of lung [J18.9] Sepsis with acute hypoxic respiratory failure without septic shock, due to unspecified organism ([place]) [A41.9, R65.20, J96.01] Patient Active Problem List   Diagnosis Date Noted  . Sepsis due to pneumonia ([place]) 07/16/2019  . Cavitary pneumonia 07/16/2019  . Morbid obesity due to excess calories ([place]) 01/07/2017  . Olecranon bursitis of right elbow 01/06/2017  . Sarcoidosis with skin and lung involvement  12/12/2016  . Uncontrolled hypertension 01/26/2015  . Essential hypertension 06/01/2013  . Chronic venous insufficiency 06/01/2013  . Allergic rhinitis 05/17/2010  . GERD 05/17/2010  . Cough 04/12/2010   PCP:  Tammi Sou, MD Pharmacy:   [facility] [place] - [address] AT [address]  [place] [address] Phone: [phone] Fax: [phone]     Social Determinants of Health (SDOH) Interventions    Readmission Risk Interventions No flowsheet data found.

## 2019-07-19 NOTE — Plan of Care (Signed)

## 2019-07-19 NOTE — Progress Notes (Signed)
  OT Cancellation Note  Patient Details Name: Sean Sharp MRN: [id number] DOB: [birth year]   Cancelled Treatment:       OT check on pt and he was In chair sleeping soundly.  OT could not wake pt. Rn aware.  Pt had pain meds earlier. Will check on pt later this day or next day  Kari Baars, [place] [phone] Office- [phone], Thereasa Parkin 07/19/2019, 2:29 PM

## 2019-07-19 NOTE — Progress Notes (Signed)
Patients O2 sat 88-90% on 2L this morning.  Increased to 3L, sat increased to 93%.  MD made aware.

## 2019-07-20 ENCOUNTER — Inpatient Hospital Stay (HOSPITAL_COMMUNITY): Payer: BC Managed Care – PPO

## 2019-07-20 DIAGNOSIS — R0602 Shortness of breath: Secondary | ICD-10-CM | POA: Diagnosis not present

## 2019-07-20 DIAGNOSIS — J984 Other disorders of lung: Secondary | ICD-10-CM | POA: Diagnosis not present

## 2019-07-20 DIAGNOSIS — A419 Sepsis, unspecified organism: Secondary | ICD-10-CM | POA: Diagnosis not present

## 2019-07-20 DIAGNOSIS — R05 Cough: Secondary | ICD-10-CM | POA: Diagnosis not present

## 2019-07-20 DIAGNOSIS — D869 Sarcoidosis, unspecified: Secondary | ICD-10-CM | POA: Diagnosis not present

## 2019-07-20 DIAGNOSIS — J9601 Acute respiratory failure with hypoxia: Secondary | ICD-10-CM | POA: Diagnosis not present

## 2019-07-20 DIAGNOSIS — I10 Essential (primary) hypertension: Secondary | ICD-10-CM | POA: Diagnosis not present

## 2019-07-20 DIAGNOSIS — J309 Allergic rhinitis, unspecified: Secondary | ICD-10-CM

## 2019-07-20 DIAGNOSIS — J189 Pneumonia, unspecified organism: Secondary | ICD-10-CM | POA: Diagnosis not present

## 2019-07-20 LAB — ANCA TITERS
Atypical P-ANCA titer: <1:20 {titer}
C-ANCA: <1:20 {titer}
P-ANCA: <1:20 {titer}

## 2019-07-20 LAB — CBC WITH DIFFERENTIAL/PLATELET
Abs Immature Granulocytes: 1.94 10*3/uL — ABNORMAL HIGH (ref 0.00–0.07)
Basophils Absolute: 0 10*3/uL (ref 0.0–0.1)
Basophils Relative: 0 %
Eosinophils Absolute: 0.1 10*3/uL (ref 0.0–0.5)
Eosinophils Relative: 1 %
HCT: 40.5 % (ref 39.0–52.0)
Hemoglobin: 12.3 g/dL — ABNORMAL LOW (ref 13.0–17.0)
Immature Granulocytes: 11 %
Lymphocytes Relative: 4 %
Lymphs Abs: 0.7 10*3/uL (ref 0.7–4.0)
MCH: 21.5 pg — ABNORMAL LOW (ref 26.0–34.0)
MCHC: 30.4 g/dL (ref 30.0–36.0)
MCV: 70.8 fL — ABNORMAL LOW (ref 80.0–100.0)
Monocytes Absolute: 1 10*3/uL (ref 0.1–1.0)
Monocytes Relative: 5 %
Neutro Abs: 14.7 10*3/uL — ABNORMAL HIGH (ref 1.7–7.7)
Neutrophils Relative %: 79 %
Platelets: 259 10*3/uL (ref 150–400)
RBC: 5.72 MIL/uL (ref 4.22–5.81)
RDW: 18.6 % — ABNORMAL HIGH (ref 11.5–15.5)
WBC: 18.5 10*3/uL — ABNORMAL HIGH (ref 4.0–10.5)
nRBC: 0 % (ref 0.0–0.2)

## 2019-07-20 LAB — BASIC METABOLIC PANEL
Anion gap: 8 (ref 5–15)
BUN: 17 mg/dL (ref 6–20)
CO2: 28 mmol/L (ref 22–32)
Calcium: 8.6 mg/dL — ABNORMAL LOW (ref 8.9–10.3)
Chloride: 102 mmol/L (ref 98–111)
Creatinine, Ser: 1.1 mg/dL (ref 0.61–1.24)
GFR calc Af Amer: >60 mL/min (ref >60)
GFR calc non Af Amer: >60 mL/min (ref >60)
Glucose, Bld: 105 mg/dL — ABNORMAL HIGH (ref 70–99)
Potassium: 4.4 mmol/L (ref 3.5–5.1)
Sodium: 138 mmol/L (ref 135–145)

## 2019-07-20 LAB — QUANTIFERON-TB GOLD PLUS (RQFGPL)
QuantiFERON Mitogen Value: 0.05 IU/mL
QuantiFERON Nil Value: 0.03 IU/mL
QuantiFERON TB1 Ag Value: 0.03 IU/mL
QuantiFERON TB2 Ag Value: 0.03 IU/mL

## 2019-07-20 LAB — QUANTIFERON-TB GOLD PLUS: QuantiFERON-TB Gold Plus: UNDETERMINED — AB

## 2019-07-20 MED ORDER — DM-GUAIFENESIN ER 30-600 MG PO TB12
2.0000 | ORAL_TABLET | Freq: Two times a day (BID) | ORAL | Status: DC
  Administered 2019-07-20 – 2019-07-21 (×3): 2 via ORAL
  Filled 2019-07-20 (×3): qty 2

## 2019-07-20 MED ORDER — PANTOPRAZOLE SODIUM 40 MG PO TBEC
40.0000 mg | DELAYED_RELEASE_TABLET | Freq: Two times a day (BID) | ORAL | Status: DC
  Administered 2019-07-20 – 2019-07-21 (×2): 40 mg via ORAL
  Filled 2019-07-20 (×2): qty 1

## 2019-07-20 NOTE — Evaluation (Signed)
Occupational Therapy Evaluation Patient Details Name: Sean Sharp MRN: [id number] DOB: March 17, 1976 Today's Date: 07/20/2019    History of Present Illness 44 yo male admitted with Pna, sepsis. Hx of sarcoidosis, obesity   Clinical Impression   Pt admitted with PNA. Pt currently with functional limitations due to the deficits listed below (see OT Problem List).  Pt will benefit from skilled OT to increase their safety and independence with ADL and functional mobility for ADL to facilitate discharge to venue listed below.      Follow Up Recommendations  No OT follow up;Supervision - Intermittent    Equipment Recommendations  None recommended by OT    Recommendations for Other Services       Precautions / Restrictions Precautions Precaution Comments: monitor O2 Restrictions Weight Bearing Restrictions: No      Mobility Bed Mobility Overal bed mobility: Modified Independent                Transfers Overall transfer level: Needs assistance Equipment used: None Transfers: Sit to/from Stand;Stand Pivot Transfers Sit to Stand: Min guard Stand pivot transfers: Min guard            Balance Overall balance assessment: Mild deficits observed, not formally tested                                         ADL either performed or assessed with clinical judgement   ADL Overall ADL's : Needs assistance/impaired Eating/Feeding: Independent   Grooming: Oral care;Wash/dry face;Min guard;Standing       Lower Body Bathing: Minimal assistance;Cueing for sequencing;Cueing for compensatory techniques       Lower Body Dressing: Minimal assistance;Sit to/from stand;Cueing for safety;Cueing for compensatory techniques   Toilet Transfer: Min guard;Comfort height toilet;Ambulation   Toileting- Clothing Manipulation and Hygiene: Min guard;Cueing for safety       Functional mobility during ADLs: Min guard       Vision         Perception      Praxis      Pertinent Vitals/Pain Pain Assessment: No/denies pain        Extremity/Trunk Assessment         Cervical / Trunk Assessment Cervical / Trunk Assessment: Normal   Communication Communication Communication: No difficulties   Cognition Arousal/Alertness: Awake/alert Behavior During Therapy: WFL for tasks assessed/performed Overall Cognitive Status: Within Functional Limits for tasks assessed                                                Home Living Family/patient expects to be discharged to:: Private residence Living Arrangements: Spouse/significant other   Type of Home: House Home Access: Stairs to enter Technical brewer of Steps: 1-2   Home Layout: Able to live on main level with bedroom/bathroom     Bathroom Shower/Tub: Teacher, early years/pre: Standard     Home Equipment: None          Prior Functioning/Environment Level of Independence: Independent        Comments: electrician by trade        OT Problem List: Decreased strength;Decreased activity tolerance      OT Treatment/Interventions: Self-care/ADL training;Therapeutic activities;Patient/family education;Energy conservation    OT Goals(Current goals can be found in the care  plan section) Acute Rehab OT Goals Patient Stated Goal: less back pain. regain PLOF OT Goal Formulation: With patient Time For Goal Achievement: 07/27/19 ADL Goals Pt Will Perform Lower Body Dressing: with modified independence;sit to/from stand Pt Will Transfer to Toilet: with modified independence;ambulating Pt Will Perform Toileting - Clothing Manipulation and hygiene: with modified independence;sit to/from stand Pt Will Perform Tub/Shower Transfer: with modified independence  OT Frequency: Min 2X/week    AM-PAC OT "6 Clicks" Daily Activity     Outcome Measure Help from another person eating meals?: None Help from another person taking care of personal grooming?:  None Help from another person toileting, which includes using toliet, bedpan, or urinal?: A Little Help from another person bathing (including washing, rinsing, drying)?: A Little Help from another person to put on and taking off regular upper body clothing?: None Help from another person to put on and taking off regular lower body clothing?: A Little 6 Click Score: 21   End of Session Equipment Utilized During Treatment: Oxygen;Gait belt Nurse Communication: Mobility status  Activity Tolerance: Patient tolerated treatment well Patient left: in chair;with call bell/phone within reach  OT Visit Diagnosis: Unsteadiness on feet (R26.81);Muscle weakness (generalized) (M62.81)                Time: 1050-1105 OT Time Calculation (min): 15 min Charges:  OT General Charges $OT Visit: 1 Visit OT Evaluation $OT Eval Moderate Complexity: 1 Mod  Kari Baars, [place] [phone] Office- [phone], Edwena Felty D 07/20/2019, 4:24 PM

## 2019-07-20 NOTE — Progress Notes (Signed)
PROGRESS NOTE    Sean Sharp  [name] DOB: July 02, 1975 DOA: 07/16/2019 PCP: Tammi Sou, MD    Brief Narrative:  44 y.o.malewith PMH HTN, pulmonary sarcoidosis, pulmonary HTN, obesity who presented to ED with fever, cough and SOB and found to have sepsis secondary to CAP/cavitary pneumonia.  Diagnosed with sarcoidosis about 8 years ago and has been on and off steroids since that time. Currently on a steroid taper.   In the ED: Vitals remarkable for elevated BP, tachycardia, tachypnea requiring 3L O2 and febrile. Initial Lactate 2.0. Ferritin 466, CRP 46, Na 134, Cr 1.68, WBC 23.6, Hgb 14.2, Plt 227, D-dimer 3.75, Procal 2.61, LDH 416, Fibrinogen > 800, COVID negative. Patient given Tylenol, Albuterol inhaler, Azithromycin, Rocephin and 2.6 L LR. Repeat Lactate 2.9. MRA:JHHID RIGHT UPPER LOBE atelectasis and/or pneumonia superimposed upon chronic findings of sarcoidosis. CTA: Concerning for cavitary PNA, multifocal PNA.  Interval development of multiple rounded ill-defined opacities throughout both lungs concerning for multifocal pneumonia or septic emboli.  Assessment & Plan:   Principal Problem:   Sepsis due to pneumonia [facility]) Active Problems:   Allergic rhinitis   GERD   Cough   Essential hypertension   Sarcoidosis with skin and lung involvement    Morbid obesity due to excess calories ([place])   Cavitary pneumonia   Sepsis secondary to multifocal community-acquired pneumonia and cavitary pneumonia in the setting of pulmonary sarcoidosis, poa Presented with worsening cough, leukocytosis, elevated procalcitonin, elevated lactic acid, tachycardia, tachypnea, and acute hypoxia WBC had been trending down, afebrile today Blood cultures negative to date MRSA screen negative Continue bronchodilators and pulmonary toilet Continue IV antibiotics IV azithromycin and zosyn day #4. Ongoing work-up for cavitary lesions on CT chest Pulmonary following. Still O2 dependent  requiring up to [facility] this AM  Possible septic emboli, seen on chest CT No IV drug use, no reported dental carries or wounds Blood cultures x2 -to date. Per above, cont abx as tolerated  Cavitary lesions on CT, r/o other causes Work up in place per pulmonary: quant gold TB, crypto ag, blasto ag, histo ag Denies hx of TB Continued on abx per above  Pulmonary sarcoidosis with skin involvement On plaquenil and prednisone Follows with rheumatology outpatient Management here per Pulmonary, Dr. Melvyn Novas.  We appreciate assistance.         Had a 2D echo done on 06/22/2019 which showed LVEF 60 to 65% with no evidence of PAH Per Pulmonary, cavitary disease likely not related to sarcoid  Acute hypoxemic respiratory failure likely secondary to above Not on oxygen supplementation at baseline Maintain O2 saturation greater 92% Obtain Home O2 eval prior to DC, per Pulm, will likely need O2 on d/c  AKI Baseline creatinine appears to be 1.2 with GFR greater than 60 Presented with creatinine of 1.68 with GFR 57 He is about at his baseline renal function Continue to avoid nephrotoxins and hypotension Continue to monitor urine output Repeat bmet in AM  Essential hypertension BP is not at goal Continue home medications Norvasc and metoprolol Spironolactone remains on hold due to AKI Continue to monitor Now on 37.4m toprol XL  GERD Stable Continue PPI  DVT prophylaxis: Lovenox subq Code Status: Full Family Communication: Pt in room, family not at bedside Disposition Plan: Home when weaned off O2 and when cleared by Pulmonary  Consultants:   Pulmonary  Procedures:     Antimicrobials: Anti-infectives (From admission, onward)   Start     Dose/Rate Route Frequency Ordered Stop   07/20/19 0600  levofloxacin (LEVAQUIN) IVPB 750 mg     750 mg 100 mL/hr over 90 Minutes Intravenous Every 24 hours 07/19/19 1232     07/17/19 0300  vancomycin (VANCOCIN) IVPB 1000 mg/200 mL premix   Status:  Discontinued     1,000 mg 200 mL/hr over 60 Minutes Intravenous Every 12 hours 07/16/19 1501 07/17/19 1001   07/16/19 2200  hydroxychloroquine (PLAQUENIL) tablet 400 mg    Note to Pharmacy: Take 2 daily     400 mg Oral Daily 07/16/19 1757     07/16/19 1830  piperacillin-tazobactam (ZOSYN) IVPB 3.375 g  Status:  Discontinued     3.375 g 12.5 mL/hr over 240 Minutes Intravenous Every 8 hours 07/16/19 1815 07/19/19 1232   07/16/19 1800  piperacillin-tazobactam (ZOSYN) IVPB 4.5 g  Status:  Discontinued     4.5 g 200 mL/hr over 30 Minutes Intravenous Every 6 hours 07/16/19 1757 07/16/19 1815   07/16/19 1515  vancomycin (VANCOREADY) IVPB 2000 mg/400 mL     2,000 mg 200 mL/hr over 120 Minutes Intravenous  Once 07/16/19 1501 07/16/19 1755   07/16/19 1100  cefTRIAXone (ROCEPHIN) 2 g in sodium chloride 0.9 % 100 mL IVPB  Status:  Discontinued     2 g 200 mL/hr over 30 Minutes Intravenous Every 24 hours 07/16/19 1055 07/16/19 1757   07/16/19 1100  azithromycin (ZITHROMAX) 500 mg in sodium chloride 0.9 % 250 mL IVPB  Status:  Discontinued     500 mg 250 mL/hr over 60 Minutes Intravenous Every 24 hours 07/16/19 1055 07/19/19 1232       Subjective: Still coughing. Was SOB on moderate exertion, needing more O2  Objective: Vitals:   07/20/19 1016 07/20/19 1341 07/20/19 1417 07/20/19 1437  BP:  140/87  133/78  Pulse:  78  85  Resp:      Temp:  98.7 F (37.1 C)    TempSrc:  Oral    SpO2: 92% 92% 92% 93%  Weight:      Height:        Intake/Output Summary (Last 24 hours) at 07/20/2019 1627 Last data filed at 07/20/2019 0600 Gross per 24 hour  Intake 33.62 ml  Output --  Net 33.62 ml   Filed Weights   07/16/19 1050  Weight: (!) 138.3 kg    Examination:  General exam: Appears calm and comfortable  Respiratory system: Clear to auscultation. Respiratory effort normal. Cardiovascular system: S1 & S2 heard, Regular Gastrointestinal system: Abdomen is nondistended, soft and  nontender. No organomegaly or masses felt. Normal bowel sounds heard. Central nervous system: Alert and oriented. No focal neurological deficits. Extremities: Symmetric 5 x 5 power. Skin: No rashes, lesions  Psychiatry: Judgement and insight appear normal. Mood & affect appropriate.   Data Reviewed: I have personally reviewed following labs and imaging studies  CBC: Recent Labs  Lab 07/16/19 1103 07/16/19 1103 07/16/19 1825 07/17/19 0526 07/18/19 0511 07/19/19 0525 07/20/19 0345  WBC 23.6*   < > 19.2* 19.3* 18.5* 18.8* 18.5*  NEUTROABS 20.0*  --   --   --   --  14.9* 14.7*  HGB 14.2   < > 12.6* 13.6 12.3* 13.2 12.3*  HCT 45.3   < > 40.3 44.0 39.7 45.0 40.5  MCV 69.3*   < > 69.6* 70.1* 69.6* 72.1* 70.8*  PLT 227   < > 199 204 PLATELET CLUMPS NOTED ON SMEAR, COUNT APPEARS ADEQUATE 241 259   < > = values in this interval not displayed.   Basic Metabolic  Panel: Recent Labs  Lab 07/16/19 1103 07/17/19 0526 07/18/19 0608 07/19/19 0525 07/20/19 0345  NA 134* 135 135 138 138  K 4.0 4.8 4.2 4.4 4.4  CL 94* 99 98 101 102  CO2 _0 GLUCOSE 128* 117* 119* 93 105*  BUN _1 CREATININE 1.68* 1.23 1.30* 1.38* 1.10  CALCIUM 8.9 8.7* 8.7* 8.8* 8.6*   GFR: Estimated Creatinine Clearance: 128.1 mL/min (by C-G formula based on SCr of 1.1 mg/dL). Liver Function Tests: Recent Labs  Lab 07/16/19 1103 07/17/19 0526 07/18/19 0608  AST 35 49* 50*  ALT 28 29 41  ALKPHOS 109 106 85  BILITOT 0.9 0.9 0.5  PROT 8.1 7.2 6.7  ALBUMIN 3.0* 2.4* 2.4*   No results for input(s): LIPASE, AMYLASE in the last 168 hours. No results for input(s): AMMONIA in the last 168 hours. Coagulation Profile: Recent Labs  Lab 07/16/19 1103  INR 1.2   Cardiac Enzymes: No results for input(s): CKTOTAL, CKMB, CKMBINDEX, TROPONINI in the last 168 hours. BNP (last 3 results) No results for input(s): PROBNP in the last 8760 hours. HbA1C: No results for input(s): HGBA1C in the last 72  hours. CBG: No results for input(s): GLUCAP in the last 168 hours. Lipid Profile: No results for input(s): CHOL, HDL, LDLCALC, TRIG, CHOLHDL, LDLDIRECT in the last 72 hours. Thyroid Function Tests: No results for input(s): TSH, T4TOTAL, FREET4, T3FREE, THYROIDAB in the last 72 hours. Anemia Panel: No results for input(s): VITAMINB12, FOLATE, FERRITIN, TIBC, IRON, RETICCTPCT in the last 72 hours. Sepsis Labs: Recent Labs  Lab 07/16/19 1055 07/16/19 1103 07/16/19 1316 07/16/19 1555 07/16/19 1825 07/16/19 2109 07/19/19 0525  PROCALCITON  --  2.61  --   --   --   --  1.23  LATICACIDVEN   < >  --  2.9* 2.5* 2.6* 2.3*  --    < > = values in this interval not displayed.    Recent Results (from the past 240 hour(s))  Novel Coronavirus, NAA (Hosp order, Send-out to Ref Lab; TAT 18-24 hrs     Status: None   Collection Time: 07/13/19  8:34 PM   Specimen: Nasopharyngeal Swab; Respiratory  Result Value Ref Range Status   SARS-CoV-2, NAA NOT DETECTED NOT DETECTED Final    Comment: (NOTE) This nucleic acid amplification test was developed and its performance characteristics determined by [employer]. Nucleic acid amplification tests include RT-PCR and TMA. This test has not been FDA cleared or approved. This test has been authorized by FDA under an Emergency Use Authorization (EUA). This test is only authorized for the duration of time the declaration that circumstances exist justifying the authorization of the emergency use of in vitro diagnostic tests for detection of SARS-CoV-2 virus and/or diagnosis of COVID-19 infection under section 564(b)(1) of the Act, 21 U.S.C. 445HQU-0(Q) (1), unless the authorization is terminated or revoked sooner. When diagnostic testing is negative, the possibility of a false negative result should be considered in the context of a patient's recent exposures and the presence of clinical signs and symptoms consistent with COVID-19. An individual  without symptoms of COVID- 19 and who is not shedding SARS-CoV-2  virus would expect to have a negative (not detected) result in this assay. Performed At: [facility] [address] [place], [place] [id number] Rush Farmer MD NG:[id number]    [place]  Final    Comment: Performed at [facility], [place] [address], [place], [place]  [address]  Blood Culture (routine x 2)     Status: None (Preliminary result)   Collection Time: 07/16/19 11:03 AM   Specimen: BLOOD  Result Value Ref Range Status   Specimen Description   Final    BLOOD RIGHT ANTECUBITAL Performed at [facility] [address] [place], [place] [address]    Special Requests   Final    BAA Blood Culture results may not be optimal due to an excessive volume of blood received in culture bottles Performed at [facility] [address] [place], [place] [address]    Culture   Final    NO GROWTH 4 DAYS Performed at [facility], [place] [address], [place], [place] [address]    Report Status PENDING  Incomplete  Respiratory Panel by RT PCR (Flu A&B, Covid) - Nasopharyngeal Swab     Status: None   Collection Time: 07/16/19 11:03 AM   Specimen: Nasopharyngeal Swab  Result Value Ref Range Status   SARS Coronavirus 2 by RT PCR NEGATIVE NEGATIVE Final    Comment: (NOTE) SARS-CoV-2 target nucleic acids are NOT DETECTED. The SARS-CoV-2 RNA is generally detectable in upper respiratoy specimens during the acute phase of infection. The lowest concentration of SARS-CoV-2 viral copies this assay can detect is 131 copies/mL. A negative result does not preclude SARS-Cov-2 infection and should not be used as the sole basis for treatment or other patient management decisions. A negative result may occur with  improper specimen collection/handling, submission of specimen other than nasopharyngeal swab, presence of viral mutation(s) within the areas  targeted by this assay, and inadequate number of viral copies (<131 copies/mL). A negative result must be combined with clinical observations, patient history, and epidemiological information. The expected result is Negative. Fact Sheet for Patients:  PinkCheek.be Fact Sheet for Healthcare Providers:  GravelBags.it This test is not yet ap proved or cleared by the [place] FDA and  has been authorized for detection and/or diagnosis of SARS-CoV-2 by FDA under an Emergency Use Authorization (EUA). This EUA will remain  in effect (meaning this test can be used) for the duration of the COVID-19 declaration under Section 564(b)(1) of the Act, 21 U.S.C. section 360bbb-3(b)(1), unless the authorization is terminated or revoked sooner.    Influenza A by PCR NEGATIVE NEGATIVE Final   Influenza B by PCR NEGATIVE NEGATIVE Final    Comment: (NOTE) The Xpert Xpress SARS-CoV-2/FLU/RSV assay is intended as an aid in  the diagnosis of influenza from Nasopharyngeal swab specimens and  should not be used as a sole basis for treatment. Nasal washings and  aspirates are unacceptable for Xpert Xpress SARS-CoV-2/FLU/RSV  testing. Fact Sheet for Patients: PinkCheek.be Fact Sheet for Healthcare Providers: GravelBags.it This test is not yet approved or cleared by the [place] FDA and  has been authorized for detection and/or diagnosis of SARS-CoV-2 by  FDA under an Emergency Use Authorization (EUA). This EUA will remain  in effect (meaning this test can be used) for the duration of the  Covid-19 declaration under Section 564(b)(1) of the Act, 21  U.S.C. section 360bbb-3(b)(1), unless the authorization is  terminated or revoked. Performed at [facility], [place] [address] [place], [place] [address]   Blood Culture (routine x 2)     Status: None (Preliminary result)     Collection Time: 07/16/19 11:04 AM   Specimen: BLOOD LEFT HAND  Result Value Ref Range Status   Specimen Description   Final  BLOOD LEFT HAND Performed at [facility] [address] [place], [place] [address]    Special Requests   Final    AEB Blood Culture results may not be optimal due to an inadequate volume of blood received in culture bottles Performed at [facility] [address], [place], [place] [address]    Culture   Final    NO GROWTH 4 DAYS Performed at [facility], [place] [address], [place], [place] [address]    Report Status PENDING  Incomplete  MRSA PCR Screening     Status: None   Collection Time: 07/16/19  3:04 PM   Specimen: Nasal Mucosa; Nasopharyngeal  Result Value Ref Range Status   MRSA by PCR NEGATIVE NEGATIVE Final    Comment:        The GeneXpert MRSA Assay (FDA approved for NASAL specimens only), is one component of a comprehensive MRSA colonization surveillance program. It is not intended to diagnose MRSA infection nor to guide or monitor treatment for MRSA infections. Performed at [facility], [place] [address] [place],  [address]   Urine culture     Status: None   Collection Time: 07/16/19  3:51 PM   Specimen: In/Out Cath Urine  Result Value Ref Range Status   Specimen Description IN/OUT CATH URINE  Final   Special Requests   Final    NONE Performed at [facility] [address] [place],  [address]    Culture NO GROWTH  Final   Report Status 07/18/2019 FINAL  Final     Radiology Studies: DG Chest 2 View  Result Date: 07/20/2019 CLINICAL DATA:  Shortness of breath, follow-up pneumonia EXAM: CHEST - 2 VIEW COMPARISON:  CTA chest dated 07/16/2019 FINDINGS: Multifocal pneumonia, right upper lobe predominant. Interval central lucency in the dominant right upper lobe masslike opacity, compatible with central cavitation. Cavitary left lower  lobe pneumonia on CT corresponds to a left perihilar lesion on chest radiograph. Small right pleural effusion.  No pneumothorax. The heart is normal in size. Visualized osseous structures are within normal limits. IMPRESSION: Multifocal cavitary pneumonia, right upper lobe predominant, with interval right upper lobe cavitation. Electronically Signed   By: Julian Hy M.D.   On: 07/20/2019 09:41    Scheduled Meds: . amLODipine  10 mg Oral Daily  . dextromethorphan-guaiFENesin  2 tablet Oral BID  . enoxaparin (LOVENOX) injection  70 mg Subcutaneous Q24H  . hydroxychloroquine  400 mg Oral Daily  . ipratropium  2 puff Inhalation TID  . levalbuterol  2 puff Inhalation TID  . metoprolol succinate  37.5 mg Oral Daily  . pantoprazole  40 mg Oral BID AC  . predniSONE  30 mg Oral Q breakfast  . senna-docusate  2 tablet Oral BID   Continuous Infusions: . sodium chloride 250 mL (07/18/19 1413)  . levofloxacin (LEVAQUIN) IV 100 mL/hr at 07/20/19 0600     LOS: 4 days   Marylu Lund, MD [facility] Pager On Amion  If 7PM-7AM, please contact night-coverage 07/20/2019, 4:27 PM

## 2019-07-20 NOTE — Progress Notes (Signed)
RN received call from telemetry that patient's heart rate had increased to 140 and was back down below 100.  RN entered room.  Patient sitting in chair and reported a coughing episode.  VS BP 133/78, pulse 85, O2 93% on 4L.  Will continue to monitor.

## 2019-07-20 NOTE — Progress Notes (Signed)
Patient's sats 89% on 3L [place].  Sats increased to 4L [place] with sats 92%.  MD notified.  Will continue to monitor.

## 2019-07-20 NOTE — Progress Notes (Signed)
NAME:  Sean Sharp, MRN:  [id number], DOB:  December 21, 1975, LOS: 4 ADMISSION DATE:  07/16/2019, CONSULTATION DATE: 07/17/2019 REFERRING MD: Dr. 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 intake for about 2 to 3 days progressive shortness of breath, cough, fevers chills diarrhea  Tested negative for Covid  Progression of his symptoms led to presentation to the emergency department   Note at baseline on plaquenil 400 mg daily, pred from 40 to 10 mg daily (very evasive when questioned re exactly what dose he's using) with good control of rash and rhinitis controlled on zyrtec daily (can really tell when he misses a dose)   Past Medical History   Past Medical History:  Diagnosis Date  . Allergic rhinitis   . 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  Hemoglobin electrophoresis normal 2016.  [facility] Chronic renal insufficiency, stage II (mild)   . Cutaneous sarcoidosis    [place] rheum: as of 02/2019->plaquenil.  Cont plaquenil and slowly tapering prednisone as of 06/2019 rheum f/u  . Hoarseness 2015   [facility] 04/2014-normal laryngoscopy: prednisone taper and bid nexium recommended.  No signif help so pt got 2nd opinion at [facility]   . HTN (hypertension)   . Hydrocele    left  . LPRD (laryngopharyngeal reflux disease)   . Obesity, Class II, BMI 35-39.9   . Olecranon bursitis of right elbow 04/29/2018   persistent/recurrent s/p aspiration-->bursa excision   . Peripheral edema    left leg>right leg  . Prediabetes 02/2017   A1c 6.3% (right when he was starting to take prednisone prn for treatment  of his sarcoidosis).  HbA1c 08/2017=6.3%.  . Pulmonary arterial hypertension (HCC)    Mild (R heart cath 06/27/19), echo 06/2019 normal.  Wt loss and CPAP recommended  . 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 Melvyn Novas suspects it was smoldering since 2011.  Skin bx of cutaneous lesion confirmed dx of sarcoidosis 02/2017.  Pt improving fall 2018 with plaquenil and prednisone.  Pt self d/c'd plaquenil, takes pred prn per his own judgement, pt commonly takes meds incorrectly/not as recommended by MDs.  . Vocal cord polyps 2015   surgery [facility]     Significant Hospital Events     Consults:  CCM 2/7  Procedures:  None  Significant Diagnostic Tests:  CT scan of the chest IMPRESSION: 1. Unfortunately, there is limited opacification of the pulmonary arteries. There is no evidence of large central pulmonary embolus in the main pulmonary artery or main portions of the right and left pulmonary arteries. However, smaller emboli in the more peripheral branches of the pulmonary arteries cannot be excluded on the basis of this exam. 2. Stable calcified adenopathy is noted consistent with history of sarcoidosis. 3. Interval development of multiple rounded ill-defined opacities throughout both lungs concerning for multifocal pneumonia or septic emboli. The largest measures 9.7 x 8.6 cm in the right lung apex. Cavitary abnormality measuring 5.0 x 4.3 cm is noted in the left lower lobe posteriorly most consistent with cavitary pneumonia. 1.9 cm cavitary abnormality is also noted in the left lower lobe.  Echo from 06/22/2019 -No evidence of pulmonary hypertension/ confirmed on RHC 06/27/19   Anca titers 2/8 >>> ESR  2/8  = 75  Micro Data:  Blood culture 2/6>> MRSA PCR 2/6 neg  Urine culture 2/6 neg  Viral resp panel 2/6 neg covid/ influenza PCT  2/6 = 2.61  Urine Strep 2/7 neg  Urine Legionnella Ag 2/7=  POSITIVE Quant Gold TB 2/8 >>> Crypto ag 2/8 >>> neg Blasto ag 2/8 >>> Histo ag  2/8 >>> PCT 2/9 = 1/23     Antimicrobials:  Rocephin 2/6 only  Vancomycin 2/6 only  Azithromycin 2/6>>2/9 Zosyn 2/6>>2/9 Levaquin IV  2/9 >>>    Interim history/subjective:  Harsh coughing fits persist but no producing anything   Objective   Blood pressure (!) 147/84, pulse 82, temperature 99.9 F (37.7 C), temperature source Oral, resp. rate 20, height _0  (1.88 m), weight (!) 138.3 kg, SpO2 90 %.        Intake/Output Summary (Last 24 hours) at 07/20/2019 1610 Last data filed at 07/20/2019 0600 Gross per 24 hour  Intake 596.34 ml  Output --  Net 596.34 ml   Filed Weights   07/16/19 1050  Weight: (!) 138.3 kg    Examination: Sitting in Chair sats 90% on 3lplm  No jvd Oropharynx clear,  mucosa nl Neck supple Lungs with minimal  exp < insp rhonchi bilaterally RRR no s3 or or sign murmur Abd obese with nl excursion  Extr warm with no edema or clubbing noted Neuro  Sensorium intact,  no apparent motor deficits    cxr 2/10 with cavity formation RUL     Resolved Hospital Problem list     Assessment & Plan:  Multilobar pneumonia superimposed on pulmonary sarcoidosis  - cavitary dz is not likely related to sarcoid  - ? Septic emboli > cultures neg but urine Legiella Positive and can cause this exact patter so rec 10 days levaquin IV or po    Sepsis from multilobar pneumonia  - resolved  Sarcoidosis - prev maint on plaquenil 400 and prn pred per rheumatology  The goal with a chronic steroid dependent illness is always arriving at the lowest effective dose that controls the disease/symptoms and not accepting a set "formula" which is based on statistics or guidelines that don't always take into account patient  variability or the natural hx of the dz in every individual patient, which may well vary over time.  For now therefore I recommend the patient maintain  A ceiling of 20 mg and a floor of 10 mg daily   Acute Hypoxemic respiratory failure present on admit   - titrate for sats > 90%  - I suspect he'll need 02 short term at d/c   Acute kidney injury Lab Results  Component Value Date   CREATININE 1.10 07/20/2019   CREATININE 1.38 (H) 07/19/2019   CREATININE 1.30 (H) 07/18/2019   CREATININE 1.43 (H) 02/28/2016    Probably due to sepsis/ Ct contrast > resolving nicely           Labs   CBC: Recent Labs  Lab 07/16/19 1103 07/16/19 1103 07/16/19 1825 07/17/19 0526 07/18/19 0511 07/19/19 0525 07/20/19 0345  WBC 23.6*   < > 19.2* 19.3* 18.5* 18.8* 18.5*  NEUTROABS 20.0*  --   --   --   --  14.9* 14.7*  HGB 14.2   < > 12.6* 13.6 12.3* 13.2 12.3*  HCT 45.3   < > 40.3 44.0 39.7 45.0 40.5  MCV 69.3*   < > 69.6* 70.1* 69.6* 72.1* 70.8*  PLT 227   < > 199 204 PLATELET CLUMPS NOTED ON SMEAR, COUNT APPEARS ADEQUATE 241 259   < > = values in this interval not displayed.    Basic Metabolic Panel: Recent Labs  Lab 07/16/19 1103 07/17/19 0526 07/18/19 0608 07/19/19 0525 07/20/19 0345  NA 134* 135 135 138 138  K 4.0 4.8 4.2 4.4 4.4  CL 94* 99 98 101 102  CO2 _0 GLUCOSE 128* 117* 119* 93 105*  BUN _1 CREATININE 1.68* 1.23 1.30* 1.38* 1.10  CALCIUM 8.9 8.7* 8.7* 8.8* 8.6*   GFR: Estimated Creatinine Clearance: 128.1 mL/min (by C-G formula based on SCr of 1.1 mg/dL). Recent Labs  Lab 07/16/19 1055 07/16/19 1103 07/16/19 1103 07/16/19 1316 07/16/19 1555 07/16/19 1825 07/16/19 1825 07/16/19 2109 07/17/19 0526 07/18/19 0511 07/19/19 0525 07/20/19 0345  PROCALCITON  --  2.61  --   --   --   --   --   --   --   --  1.23  --   WBC  --  23.6*   < >  --   --  19.2*   < >  --  19.3* 18.5* 18.8* 18.5*  LATICACIDVEN   < >  --   --  2.9* 2.5* 2.6*  --  2.3*  --   --   --   --    < > = values in this interval not displayed.    Liver Function Tests: Recent Labs  Lab 07/16/19 1103 07/17/19 0526 07/18/19 0608  AST 35 49* 50*  ALT 28 29 41  ALKPHOS 109 106 85  BILITOT 0.9 0.9 0.5  PROT 8.1 7.2 6.7   ALBUMIN 3.0* 2.4* 2.4*   No results for input(s): LIPASE, AMYLASE in the last 168 hours. No results for input(s): AMMONIA in the last 168 hours.  ABG    Component Value Date/Time   HCO3 25.9 06/27/2019 0825   TCO2 27 06/27/2019 0825   O2SAT 72.0 06/27/2019 0825     Coagulation Profile: Recent Labs  Lab 07/16/19 1103  INR 1.2    Cardiac Enzymes: No results for input(s): CKTOTAL, CKMB, CKMBINDEX, TROPONINI in the last 168 hours.  HbA1C: Hgb A1c MFr Bld  Date/Time Value Ref Range Status  04/20/2018 10:40 AM 6.2 4.6 - 6.5 % Final    Comment:    Glycemic Control Guidelines for People with Diabetes:Non Diabetic:  <6%Goal of Therapy: <7%Additional Action Suggested:  >8%   09/04/2017 10:10 AM 6.3 4.6 - 6.5 % Final    Comment:    Glycemic Control Guidelines for People with Diabetes:Non Diabetic:  <6%Goal of Therapy: <7%Additional Action Suggested:  >8%     CBG: No results for input(s): GLUCAP in the last 168 hours.   Will add max rx for gerd/ check sinus ct due to severe coughing fits and chronic sinus complaints plus mucinex dm   Can go home per [facility] but may need 02 titrated to keep sats > 90% and I'll see him end of next week with cxr in my office.    Christinia Gully, MD Pulmonary and [place] [phone] After 5:30 PM or weekends, use [facility] [phone]

## 2019-07-21 DIAGNOSIS — J9601 Acute respiratory failure with hypoxia: Secondary | ICD-10-CM

## 2019-07-21 LAB — CBC WITH DIFFERENTIAL/PLATELET
Abs Immature Granulocytes: 1.78 10*3/uL — ABNORMAL HIGH (ref 0.00–0.07)
Basophils Absolute: 0 10*3/uL (ref 0.0–0.1)
Basophils Relative: 0 %
Eosinophils Absolute: 0.1 10*3/uL (ref 0.0–0.5)
Eosinophils Relative: 0 %
HCT: 41.8 % (ref 39.0–52.0)
Hemoglobin: 12.6 g/dL — ABNORMAL LOW (ref 13.0–17.0)
Immature Granulocytes: 10 %
Lymphocytes Relative: 4 %
Lymphs Abs: 0.8 10*3/uL (ref 0.7–4.0)
MCH: 21.4 pg — ABNORMAL LOW (ref 26.0–34.0)
MCHC: 30.1 g/dL (ref 30.0–36.0)
MCV: 71 fL — ABNORMAL LOW (ref 80.0–100.0)
Monocytes Absolute: 0.8 10*3/uL (ref 0.1–1.0)
Monocytes Relative: 4 %
Neutro Abs: 14.8 10*3/uL — ABNORMAL HIGH (ref 1.7–7.7)
Neutrophils Relative %: 82 %
Platelets: 308 10*3/uL (ref 150–400)
RBC: 5.89 MIL/uL — ABNORMAL HIGH (ref 4.22–5.81)
RDW: 18.7 % — ABNORMAL HIGH (ref 11.5–15.5)
WBC: 18.2 10*3/uL — ABNORMAL HIGH (ref 4.0–10.5)
nRBC: 0 % (ref 0.0–0.2)

## 2019-07-21 LAB — BASIC METABOLIC PANEL
Anion gap: 10 (ref 5–15)
BUN: 17 mg/dL (ref 6–20)
CO2: 26 mmol/L (ref 22–32)
Calcium: 8.8 mg/dL — ABNORMAL LOW (ref 8.9–10.3)
Chloride: 102 mmol/L (ref 98–111)
Creatinine, Ser: 1.11 mg/dL (ref 0.61–1.24)
GFR calc Af Amer: >60 mL/min (ref >60)
GFR calc non Af Amer: >60 mL/min (ref >60)
Glucose, Bld: 112 mg/dL — ABNORMAL HIGH (ref 70–99)
Potassium: 4.4 mmol/L (ref 3.5–5.1)
Sodium: 138 mmol/L (ref 135–145)

## 2019-07-21 LAB — CULTURE, BLOOD (ROUTINE X 2)
Culture: NO GROWTH
Culture: NO GROWTH

## 2019-07-21 MED ORDER — TRAMADOL HCL 50 MG PO TABS: 50.0000 mg | ORAL_TABLET | Freq: Four times a day (QID) | ORAL | 0 refills | Status: DC | PRN

## 2019-07-21 MED ORDER — METOPROLOL SUCCINATE ER 25 MG PO TB24: 37.5000 mg | ORAL_TABLET | Freq: Every day | ORAL | 0 refills | Status: DC

## 2019-07-21 MED ORDER — PREDNISONE 10 MG PO TABS: 20.0000 mg | ORAL_TABLET | Freq: Every day | ORAL | 0 refills | Status: AC

## 2019-07-21 MED ORDER — LEVOFLOXACIN 750 MG PO TABS: 750.0000 mg | ORAL_TABLET | Freq: Every day | ORAL | 0 refills | Status: AC

## 2019-07-21 MED ORDER — DM-GUAIFENESIN ER 30-600 MG PO TB12: 2.0000 | ORAL_TABLET | Freq: Two times a day (BID) | ORAL | 0 refills | Status: AC

## 2019-07-21 NOTE — Discharge Summary (Signed)
Physician Discharge Summary  Alik Mawson XBL:[id number] DOB: 11-Dec-1975 DOA: 07/16/2019  PCP: Tammi Sou, MD  Admit date: 07/16/2019 Discharge date: 07/21/2019  Admitted From: Home Disposition:  Home  Recommendations for Outpatient Follow-up:  1. Follow up with PCP in 1-2 weeks  Discharge Condition:Improved CODE STATUS:Full Diet recommendation: Regular   Brief/Interim Summary: 44 y.o.malewith PMH HTN, pulmonary sarcoidosis, pulmonary HTN, obesity who presented to ED with fever, cough and SOB and found to have sepsis secondary to CAP/cavitary pneumonia. Diagnosed with sarcoidosis about 8 years ago and has been on and off steroids since that time. Currently on a steroid taper.   In the ED: Vitals remarkable for elevated BP, tachycardia, tachypnea requiring 3L O2 and febrile. Initial Lactate 2.0. Ferritin 466, CRP 46, Na 134, Cr 1.68, WBC 23.6, Hgb 14.2, Plt 227, D-dimer 3.75, Procal 2.61, LDH 416, Fibrinogen > 800, COVID negative. Patient given Tylenol, Albuterol inhaler, Azithromycin, Rocephin and 2.6 L LR. Repeat Lactate 2.9. RAQ:TMAUQ RIGHT UPPER LOBE atelectasis and/or pneumonia superimposed upon chronic findings of sarcoidosis. CTA: Concerning for cavitary PNA, multifocal PNA. Interval development of multiple rounded ill-defined opacities throughout both lungs concerning for multifocal pneumonia or septic emboli.  Discharge Diagnoses:  Principal Problem:   Sepsis due to pneumonia [facility]) Active Problems:   Allergic rhinitis   GERD   Cough   Essential hypertension   Sarcoidosis with skin and lung involvement    Morbid obesity due to excess calories ([place])   Cavitary pneumonia   Sepsis secondary to multifocal community-acquired pneumonia and cavitary pneumonia in the setting of pulmonary sarcoidosis, poa Presented with worsening cough, leukocytosis, elevated procalcitonin, elevated lactic acid, tachycardia, tachypnea, and acutehypoxia WBC had been trending down,  afebrile today Blood cultures negative to date MRSA screen negative Continue bronchodilators and pulmonary toilet Initially continued IV azithromycin and zosyn Pulmonary following with recommendation for 10 days of levaquin Flutter valve ordered with good results. By day of d/c, pt was successfully weaned to room air  Possible septic emboli, seen on chest CT No IV drug use, no reported dental carries or wounds Blood cultures x2 -to date. Per above, cont abx as tolerated  Cavitary lesions on CT, r/o other causes Work up in place per pulmonary: quant gold TB, crypto ag, blasto ag, histo ag Denies hx of TB Continued on abx per above  Pulmonary sarcoidosis with skin involvement On plaquenil and prednisone Follows with rheumatology outpatient Management here per Pulmonary, Dr. Melvyn Novas. We appreciate assistance. Had a 2D echo done on 06/22/2019 which showed LVEF 60 to 65% with noevidence of PAH Per Pulmonary, cavitary disease likely not related to sarcoid Recommendation for ceiling dose of prednisone of 19m and floor dose of 130m Acute hypoxemic respiratory failure likely secondary to above Not on oxygen supplementation at baseline Pt had been O2 dependent, later weaned to room air by day of d/c  AKI Baseline creatinine appears to be 1.2 with GFR greater than 60 Presented with creatinine of 1.68 with GFR 57 He is about at his baseline renal function Continue to avoid nephrotoxins and hypotension  Essential hypertension BP is not at goal Continue home medications Norvasc and metoprolol Spironolactone remains on hold due to AKI Continue to monitor Now on 37.9m59moprol XL  GERD Stable Continue PPI   Discharge Instructions   Allergies as of 07/21/2019      Reactions   Bee Venom Swelling      Medication List    STOP taking these medications   spironolactone 25 MG tablet  Commonly known as: ALDACTONE     TAKE these medications   amLODipine 10 MG  tablet Commonly known as: NORVASC TAKE 1 TABLET BY MOUTH EVERY DAY   Clocortolone Pivalate 0.1 % cream Commonly known as: CLODERM Apply 1 application topically 2 (two) times daily as needed (Sarcoidosis/rash).   HYDROcodone-homatropine 5-1.5 MG/5ML syrup Commonly known as: HYCODAN Take 5 mLs by mouth every 6 (six) hours as needed for cough.   hydrocortisone butyrate 0.1 % Crea cream Commonly known as: LUCOID Apply 1 application topically 2 (two) times daily. What changed:   when to take this  reasons to take this   hydroxychloroquine 200 MG tablet Commonly known as: PLAQUENIL Take 2 daily What changed:   how much to take  how to take this  when to take this  additional instructions   levofloxacin 750 MG tablet Commonly known as: Levaquin Take 1 tablet (750 mg total) by mouth daily for 8 days. Start taking on: July 22, 2019   metoprolol succinate 25 MG 24 hr tablet Commonly known as: TOPROL-XL Take 1.5 tablets (37.5 mg total) by mouth daily. Start taking on: July 22, 2019 What changed: how much to take   pantoprazole 40 MG tablet Commonly known as: PROTONIX Take 30- 60 min before your first and last meals of the day What changed:   how much to take  how to take this  when to take this   predniSONE 10 MG tablet Commonly known as: DELTASONE Take 2 tablets (20 mg total) by mouth daily. What changed:   medication strength  when to take this  Another medication with the same name was removed. Continue taking this medication, and follow the directions you see here.   traMADol 50 MG tablet Commonly known as: ULTRAM Take 1 tablet (50 mg total) by mouth every 6 (six) hours as needed for moderate pain.   traZODone 50 MG tablet Commonly known as: DESYREL 1-2 tabs po qhs prn insomnia What changed:   how much to take  how to take this  when to take this  reasons to take this  additional instructions      Follow-up Information     McGowen, Adrian Blackwater, MD. Schedule an appointment as soon as possible for a visit in 2 week(s).   Specialty: Family Medicine Contact information: [address] [place] [address] [phone]          Allergies  Allergen Reactions  . Bee Venom Swelling    Consultations:  Pulmonary  Procedures/Studies: DG Chest 2 View  Result Date: 07/20/2019 CLINICAL DATA:  Shortness of breath, follow-up pneumonia EXAM: CHEST - 2 VIEW COMPARISON:  CTA chest dated 07/16/2019 FINDINGS: Multifocal pneumonia, right upper lobe predominant. Interval central lucency in the dominant right upper lobe masslike opacity, compatible with central cavitation. Cavitary left lower lobe pneumonia on CT corresponds to a left perihilar lesion on chest radiograph. Small right pleural effusion.  No pneumothorax. The heart is normal in size. Visualized osseous structures are within normal limits. IMPRESSION: Multifocal cavitary pneumonia, right upper lobe predominant, with interval right upper lobe cavitation. Electronically Signed   By: Julian Hy M.D.   On: 07/20/2019 09:41   CT Angio Chest PE W/Cm &/Or Wo Cm  Result Date: 07/16/2019 CLINICAL DATA:  Shortness of breath. EXAM: CT ANGIOGRAPHY CHEST WITH CONTRAST TECHNIQUE: Multidetector CT imaging of the chest was performed using the standard protocol during bolus administration of intravenous contrast. Multiplanar CT image reconstructions and MIPs were obtained  to evaluate the vascular anatomy. CONTRAST:  4m OMNIPAQUE IOHEXOL 350 MG/ML SOLN COMPARISON:  March 16, 2019. FINDINGS: Cardiovascular: Unfortunately, there is limited opacification of the pulmonary arteries. There is no evidence of large central pulmonary embolus in the main pulmonary artery or main portions of the right and left pulmonary arteries. However, smaller emboli in the more peripheral branches of the pulmonary arteries cannot be excluded on the basis of this exam. Normal cardiac size. No  pericardial effusion. Mediastinum/Nodes: Stable calcified adenopathy is noted consistent with history of sarcoidosis. The esophagus is unremarkable. Thyroid gland is unremarkable. Lungs/Pleura: No pneumothorax is noted. There is interval development of multiple rounded ill-defined opacities throughout both lungs concerning for multifocal pneumonia or septic emboli. The largest measures 9.7 x 8.6 cm in the right lung apex. Cavitary abnormality measuring 5.0 x 4.3 cm is noted in the left lower lobe posteriorly most consistent with cavitary pneumonia. 1.9 cm cavitary abnormality is also noted in the left lower lobe. Upper Abdomen: No acute abnormality. Musculoskeletal: No chest wall abnormality. No acute or significant osseous findings. Review of the MIP images confirms the above findings. IMPRESSION: 1. Unfortunately, there is limited opacification of the pulmonary arteries. There is no evidence of large central pulmonary embolus in the main pulmonary artery or main portions of the right and left pulmonary arteries. However, smaller emboli in the more peripheral branches of the pulmonary arteries cannot be excluded on the basis of this exam. 2. Stable calcified adenopathy is noted consistent with history of sarcoidosis. 3. Interval development of multiple rounded ill-defined opacities throughout both lungs concerning for multifocal pneumonia or septic emboli. The largest measures 9.7 x 8.6 cm in the right lung apex. Cavitary abnormality measuring 5.0 x 4.3 cm is noted in the left lower lobe posteriorly most consistent with cavitary pneumonia. 1.9 cm cavitary abnormality is also noted in the left lower lobe. Electronically Signed   By: JMarijo ConceptionM.D.   On: 07/16/2019 13:42   CARDIAC CATHETERIZATION  Result Date: 06/27/2019 Findings: RA = 9 RV = 36/12 PA = 38/6 (24) PCW = 11 Fick cardiac output/index = 7.1/2.7 PVR = 1.9 WU FA sat = 98% PA sat = 72%, 75% SVC sat = 75% Assessment: 1. Very mild PAH with normal  PVR Plan/Discussion: Recommend weight loss and CPAP. DGlori Bickers MD 8:31 AM  DG Chest Port 1 View  Result Date: 07/16/2019 CLINICAL DATA:  44year old presenting with a 1 week history of fever and productive cough. Current history of sarcoidosis. EXAM: PORTABLE CHEST 1 VIEW COMPARISON:  Chest x-rays 02/25/2018 and earlier, CT chest 03/16/2019. FINDINGS: Since the most recent prior CT in October, 2020, development of dense consolidation and volume loss in the RIGHT UPPER LOBE. The micronodularity and chronic scarring related to sarcoidosis are less conspicuous on the chest x-ray. No new pulmonary parenchymal abnormalities elsewhere. Cardiac silhouette normal in size, unchanged. IMPRESSION: Acute RIGHT UPPER LOBE atelectasis and/or pneumonia superimposed upon chronic findings of sarcoidosis. Electronically Signed   By: TEvangeline DakinM.D.   On: 07/16/2019 12:09   ECHOCARDIOGRAM COMPLETE  Result Date: 06/22/2019   ECHOCARDIOGRAM REPORT   Patient Name:   CAVAENGLISH Date of Exam: 06/22/2019 Medical Rec #:  [id number]       Height:       74.0 in Accession #:    [id number]      Weight:       290.0 lb Date of Birth:  [birth year]  BSA:          2.55 m Patient Age:    43 years         BP:           130/74 mmHg Patient Gender: M                HR:           77 bpm. Exam Location:  Outpatient Procedure: 2D Echo, Cardiac Doppler, Color Doppler and Strain Analysis Indications:    Sarcoidosis  History:        Patient has no prior history of Echocardiogram examinations.                 Risk Factors:Hypertension and Former Smoker. GERD.  Sonographer:    Paulita Fujita RDCS Referring Phys: [id number] DANIEL R BENSIMHON IMPRESSIONS  1. Left ventricular ejection fraction, by visual estimation, is 60 to 65%. The left ventricle has normal function. There is no left ventricular hypertrophy.  2. The left ventricle has no regional wall motion abnormalities.  3. Global right ventricle has normal systolic function.The  right ventricular size is normal. No increase in right ventricular wall thickness.  4. Left atrial size was normal.  5. Right atrial size was normal.  6. The mitral valve is normal in structure. Trivial mitral valve regurgitation.  7. The tricuspid valve is normal in structure.  8. The aortic valve is normal in structure. Aortic valve regurgitation is trivial.  9. The pulmonic valve was normal in structure. Pulmonic valve regurgitation is trivial. 10. The inferior vena cava is normal in size with greater than 50% respiratory variability, suggesting right atrial pressure of 3 mmHg. 11. No evidence of PAH or RV strain. TR jet inadequate to estimate RVSP. FINDINGS  Left Ventricle: Left ventricular ejection fraction, by visual estimation, is 60 to 65%. The left ventricle has normal function. The left ventricle has no regional wall motion abnormalities. There is no left ventricular hypertrophy. Left ventricular diastolic parameters were normal. Right Ventricle: The right ventricular size is normal. No increase in right ventricular wall thickness. Global RV systolic function is has normal systolic function. Left Atrium: Left atrial size was normal in size. Right Atrium: Right atrial size was normal in size Pericardium: There is no evidence of pericardial effusion. Mitral Valve: The mitral valve is normal in structure. Trivial mitral valve regurgitation. Tricuspid Valve: The tricuspid valve is normal in structure. Tricuspid valve regurgitation is trivial. Aortic Valve: The aortic valve is normal in structure. Aortic valve regurgitation is trivial. Pulmonic Valve: The pulmonic valve was normal in structure. Pulmonic valve regurgitation is trivial. Pulmonic regurgitation is trivial. Aorta: The aortic root and ascending aorta are structurally normal, with no evidence of dilitation. Venous: The inferior vena cava is normal in size with greater than 50% respiratory variability, suggesting right atrial pressure of 3 mmHg.  IAS/Shunts: No atrial level shunt detected by color flow Doppler.  LEFT VENTRICLE PLAX 2D LVIDd:         4.90 cm       Diastology LVIDs:         3.40 cm       LV e' lateral:   11.50 cm/s LV PW:         0.90 cm       LV E/e' lateral: 6.5 LV IVS:        0.90 cm       LV e' medial:    8.05 cm/s LVOT diam:  2.30 cm       LV E/e' medial:  9.4 LV SV:         65 ml LV SV Index:   24.51 LVOT Area:     4.15 cm  LV Volumes (MOD) LV area d, A2C:    44.00 cm LV area d, A4C:    48.80 cm LV area s, A2C:    27.70 cm LV area s, A4C:    28.20 cm LV major d, A2C:   10.10 cm LV major d, A4C:   10.60 cm LV major s, A2C:   8.36 cm LV major s, A4C:   8.98 cm LV vol d, MOD A2C: 164.0 ml LV vol d, MOD A4C: 185.0 ml LV vol s, MOD A2C: 77.9 ml LV vol s, MOD A4C: 72.9 ml LV SV MOD A2C:     86.1 ml LV SV MOD A4C:     185.0 ml LV SV MOD BP:      100.2 ml RIGHT VENTRICLE RV S prime:     14.40 cm/s TAPSE (M-mode): 2.6 cm LEFT ATRIUM             Index       RIGHT ATRIUM           Index LA diam:        3.20 cm 1.26 cm/m  RA Area:     12.00 cm LA Vol (A2C):   45.4 ml 17.84 ml/m RA Volume:   28.80 ml  11.32 ml/m LA Vol (A4C):   33.0 ml 12.97 ml/m LA Biplane Vol: 40.5 ml 15.91 ml/m  AORTIC VALVE LVOT Vmax:   126.00 cm/s LVOT Vmean:  76.900 cm/s LVOT VTI:    0.227 m  AORTA Ao Root diam: 3.30 cm MITRAL VALVE MV Area (PHT): 3.50 cm             SHUNTS MV PHT:        62.93 msec           Systemic VTI:  0.23 m MV Decel Time: 217 msec             Systemic Diam: 2.30 cm MV E velocity: 75.30 cm/s 103 cm/s MV A velocity: 48.40 cm/s 70.3 cm/s MV E/A ratio:  1.56       1.5  Glori Bickers MD Electronically signed by Glori Bickers MD Signature Date/Time: 06/22/2019/11:16:23 AM    Final    CT MAXILLOFACIAL WO CONTRAST  Result Date: 07/20/2019 CLINICAL DATA:  Cough. Chronic sinus complaints. EXAM: CT MAXILLOFACIAL WITHOUT CONTRAST TECHNIQUE: Multidetector CT imaging of the maxillofacial structures was performed. Multiplanar CT image  reconstructions were also generated. COMPARISON:  None. FINDINGS: Osseous: No fracture or destructive osseous process. Orbits: Mild bilateral exophthalmos. No orbital mass or inflammation. Sinuses: Mild posterior right ethmoid air cell mucosal thickening and trace left maxillary sinus mucosal thickening. No sinus fluid. Patent ostiomeatal units. 4 mm rightward nasal septal deviation. Paradoxical rotation of the middle turbinates. Clear mastoid air cells and tympanic cavities. Soft tissues: Unremarkable. Limited intracranial: Unremarkable. IMPRESSION: 1. Minimal paranasal sinus mucosal thickening.  No fluid. 2. Rightward nasal septal deviation. Electronically Signed   By: Logan Bores M.D.   On: 07/20/2019 17:28     Subjective: Eager to go home  Discharge Exam: Vitals:   07/21/19 0743 07/21/19 0830  BP:    Pulse:    Resp:    Temp: 99 F (37.2 C)   SpO2:  93%   Vitals:   07/21/19 0502  07/21/19 0553 07/21/19 0743 07/21/19 0830  BP: 131/80     Pulse: 90     Resp: 20     Temp: 99.1 F (37.3 C)  99 F (37.2 C)   TempSrc: Oral  Oral   SpO2: 94%   93%  Weight:  131 kg    Height:        General: Pt is alert, awake, not in acute distress, ambulating in hallway Cardiovascular: perfused, regular Respiratory: no audible wheezing, normal resp effort Abdominal: Soft, nondistended Extremities: no edema, no cyanosis   The results of significant diagnostics from this hospitalization (including imaging, microbiology, ancillary and laboratory) are listed below for reference.     Microbiology: Recent Results (from the past 240 hour(s))  Novel Coronavirus, NAA (Hosp order, Send-out to Ref Lab; TAT 18-24 hrs     Status: None   Collection Time: 07/13/19  8:34 PM   Specimen: Nasopharyngeal Swab; Respiratory  Result Value Ref Range Status   SARS-CoV-2, NAA NOT DETECTED NOT DETECTED Final    Comment: (NOTE) This nucleic acid amplification test was developed and its performance characteristics  determined by [employer]. Nucleic acid amplification tests include RT-PCR and TMA. This test has not been FDA cleared or approved. This test has been authorized by FDA under an Emergency Use Authorization (EUA). This test is only authorized for the duration of time the declaration that circumstances exist justifying the authorization of the emergency use of in vitro diagnostic tests for detection of SARS-CoV-2 virus and/or diagnosis of COVID-19 infection under section 564(b)(1) of the Act, 21 U.S.C. 678LFY-1(O) (1), unless the authorization is terminated or revoked sooner. When diagnostic testing is negative, the possibility of a false negative result should be considered in the context of a patient's recent exposures and the presence of clinical signs and symptoms consistent with COVID-19. An individual without symptoms of COVID- 19 and who is not shedding SARS-CoV-2  virus would expect to have a negative (not detected) result in this assay. Performed At: [facility] [address] [place], [place] [id number] Rush Farmer MD ID:[id number]    [place]  Final    Comment: Performed at [facility], [place] [address], [place], [place] [address]  Blood Culture (routine x 2)     Status: None   Collection Time: 07/16/19 11:03 AM   Specimen: BLOOD  Result Value Ref Range Status   Specimen Description   Final    BLOOD RIGHT ANTECUBITAL Performed at [facility] [address], [place], [place] [address]    Special Requests   Final    BAA Blood Culture results may not be optimal due to an excessive volume of blood received in culture bottles Performed at [facility] [address], [place], [place] [address]    Culture   Final    NO GROWTH 5 DAYS Performed at [facility], [place] [address], [place], [place] [address]    Report Status 07/21/2019 FINAL  Final  Respiratory Panel by RT PCR (Flu  A&B, Covid) - Nasopharyngeal Swab     Status: None   Collection Time: 07/16/19 11:03 AM   Specimen: Nasopharyngeal Swab  Result Value Ref Range Status   SARS Coronavirus 2 by RT PCR NEGATIVE NEGATIVE Final    Comment: (NOTE) SARS-CoV-2 target nucleic acids are NOT DETECTED. The SARS-CoV-2 RNA is generally detectable in upper respiratoy specimens during the acute phase of infection. The lowest concentration of SARS-CoV-2 viral copies this  assay can detect is 131 copies/mL. A negative result does not preclude SARS-Cov-2 infection and should not be used as the sole basis for treatment or other patient management decisions. A negative result may occur with  improper specimen collection/handling, submission of specimen other than nasopharyngeal swab, presence of viral mutation(s) within the areas targeted by this assay, and inadequate number of viral copies (<131 copies/mL). A negative result must be combined with clinical observations, patient history, and epidemiological information. The expected result is Negative. Fact Sheet for Patients:  PinkCheek.be Fact Sheet for Healthcare Providers:  GravelBags.it This test is not yet ap proved or cleared by the [place] FDA and  has been authorized for detection and/or diagnosis of SARS-CoV-2 by FDA under an Emergency Use Authorization (EUA). This EUA will remain  in effect (meaning this test can be used) for the duration of the COVID-19 declaration under Section 564(b)(1) of the Act, 21 U.S.C. section 360bbb-3(b)(1), unless the authorization is terminated or revoked sooner.    Influenza A by PCR NEGATIVE NEGATIVE Final   Influenza B by PCR NEGATIVE NEGATIVE Final    Comment: (NOTE) The Xpert Xpress SARS-CoV-2/FLU/RSV assay is intended as an aid in  the diagnosis of influenza from Nasopharyngeal swab specimens and  should not be used as a sole basis for treatment. Nasal washings  and  aspirates are unacceptable for Xpert Xpress SARS-CoV-2/FLU/RSV  testing. Fact Sheet for Patients: PinkCheek.be Fact Sheet for Healthcare Providers: GravelBags.it This test is not yet approved or cleared by the [place] FDA and  has been authorized for detection and/or diagnosis of SARS-CoV-2 by  FDA under an Emergency Use Authorization (EUA). This EUA will remain  in effect (meaning this test can be used) for the duration of the  Covid-19 declaration under Section 564(b)(1) of the Act, 21  U.S.C. section 360bbb-3(b)(1), unless the authorization is  terminated or revoked. Performed at [facility], [place] [address] [place], [place] [address]   Blood Culture (routine x 2)     Status: None   Collection Time: 07/16/19 11:04 AM   Specimen: BLOOD LEFT HAND  Result Value Ref Range Status   Specimen Description   Final    BLOOD LEFT HAND Performed at [facility] [address], [place], [place] [address]    Special Requests   Final    AEB Blood Culture results may not be optimal due to an inadequate volume of blood received in culture bottles Performed at [facility] [address] [place], [place] [address]    Culture   Final    NO GROWTH 5 DAYS Performed at [facility], [place] [address] [place], [place] [address]    Report Status 07/21/2019 FINAL  Final  MRSA PCR Screening     Status: None   Collection Time: 07/16/19  3:04 PM   Specimen: Nasal Mucosa; Nasopharyngeal  Result Value Ref Range Status   MRSA by PCR NEGATIVE NEGATIVE Final    Comment:        The GeneXpert MRSA Assay (FDA approved for NASAL specimens only), is one component of a comprehensive MRSA colonization surveillance program. It is not intended to diagnose MRSA infection nor to guide or monitor treatment for MRSA infections. Performed at [facility], [place]  [address], [place], [place] [address]   Urine culture     Status: None   Collection Time: 07/16/19  3:51 PM   Specimen: In/Out Cath Urine  Result Value  Ref Range Status   Specimen Description IN/OUT CATH URINE  Final   Special Requests   Final    NONE Performed at [facility] [address], [place], [place] [address]    Culture NO GROWTH  Final   Report Status 07/18/2019 FINAL  Final     Labs: BNP (last 3 results) No results for input(s): BNP in the last 8760 hours. Basic Metabolic Panel: Recent Labs  Lab 07/17/19 0526 07/18/19 0608 07/19/19 0525 07/20/19 0345 07/21/19 0406  NA 135 135 138 138 138  K 4.8 4.2 4.4 4.4 4.4  CL 99 98 101 102 102  CO2 _0 GLUCOSE 117* 119* 93 105* 112*  BUN _1 CREATININE 1.23 1.30* 1.38* 1.10 1.11  CALCIUM 8.7* 8.7* 8.8* 8.6* 8.8*   Liver Function Tests: Recent Labs  Lab 07/16/19 1103 07/17/19 0526 07/18/19 0608  AST 35 49* 50*  ALT 28 29 41  ALKPHOS 109 106 85  BILITOT 0.9 0.9 0.5  PROT 8.1 7.2 6.7  ALBUMIN 3.0* 2.4* 2.4*   No results for input(s): LIPASE, AMYLASE in the last 168 hours. No results for input(s): AMMONIA in the last 168 hours. CBC: Recent Labs  Lab 07/16/19 1103 07/16/19 1825 07/17/19 0526 07/18/19 0511 07/19/19 0525 07/20/19 0345 07/21/19 0406  WBC 23.6*   < > 19.3* 18.5* 18.8* 18.5* 18.2*  NEUTROABS 20.0*  --   --   --  14.9* 14.7* 14.8*  HGB 14.2   < > 13.6 12.3* 13.2 12.3* 12.6*  HCT 45.3   < > 44.0 39.7 45.0 40.5 41.8  MCV 69.3*   < > 70.1* 69.6* 72.1* 70.8* 71.0*  PLT 227   < > 204 PLATELET CLUMPS NOTED ON SMEAR, COUNT APPEARS ADEQUATE 241 259 308   < > = values in this interval not displayed.   Cardiac Enzymes: No results for input(s): CKTOTAL, CKMB, CKMBINDEX, TROPONINI in the last 168 hours. BNP: Invalid input(s): POCBNP CBG: No results for input(s): GLUCAP in the last 168 hours. D-Dimer No results for input(s): DDIMER in the last 72 hours. Hgb  A1c No results for input(s): HGBA1C in the last 72 hours. Lipid Profile No results for input(s): CHOL, HDL, LDLCALC, TRIG, CHOLHDL, LDLDIRECT in the last 72 hours. Thyroid function studies No results for input(s): TSH, T4TOTAL, T3FREE, THYROIDAB in the last 72 hours.  Invalid input(s): FREET3 Anemia work up No results for input(s): VITAMINB12, FOLATE, FERRITIN, TIBC, IRON, RETICCTPCT in the last 72 hours. Urinalysis    Component Value Date/Time   COLORURINE YELLOW 07/16/2019 1551   APPEARANCEUR CLEAR 07/16/2019 1551   LABSPEC 1.026 07/16/2019 1551   PHURINE 5.0 07/16/2019 1551   GLUCOSEU NEGATIVE 07/16/2019 1551   HGBUR NEGATIVE 07/16/2019 [place] 07/16/2019 1551   KETONESUR NEGATIVE 07/16/2019 1551   PROTEINUR 100 (A) 07/16/2019 1551   UROBILINOGEN 1.0 11/03/2013 1531   NITRITE NEGATIVE 07/16/2019 1551   LEUKOCYTESUR NEGATIVE 07/16/2019 1551   Sepsis Labs Invalid input(s): PROCALCITONIN,  WBC,  LACTICIDVEN Microbiology Recent Results (from the past 240 hour(s))  Novel Coronavirus, NAA (Hosp order, Send-out to Ref Lab; TAT 18-24 hrs     Status: None   Collection Time: 07/13/19  8:34 PM   Specimen: Nasopharyngeal Swab; Respiratory  Result Value Ref Range Status   SARS-CoV-2, NAA NOT DETECTED NOT DETECTED Final    Comment: (NOTE) This nucleic acid amplification test was developed and its performance characteristics determined by  [employer], [employer] and [employer]. Nucleic acid amplification tests include RT-PCR and TMA. This test has not been FDA cleared or approved. This test has been authorized by FDA under an Emergency Use Authorization (EUA). This test is only authorized for the duration of time the declaration that circumstances exist justifying the authorization of the emergency use of in vitro diagnostic tests for detection of SARS-CoV-2 virus and/or diagnosis of COVID-19 infection under section 564(b)(1) of the Act, 21 U.S.C. 503TWS-5(K) (1), unless the  authorization is terminated or revoked sooner. When diagnostic testing is negative, the possibility of a false negative result should be considered in the context of a patient's recent exposures and the presence of clinical signs and symptoms consistent with COVID-19. An individual without symptoms of COVID- 19 and who is not shedding SARS-CoV-2  virus would expect to have a negative (not detected) result in this assay. Performed At: [facility] [address] [place], [place] [id number] Rush Farmer MD FV:[id number]    [place]  Final    Comment: Performed at [facility], [place] [address], [place], [place] [address]  Blood Culture (routine x 2)     Status: None   Collection Time: 07/16/19 11:03 AM   Specimen: BLOOD  Result Value Ref Range Status   Specimen Description   Final    BLOOD RIGHT ANTECUBITAL Performed at [facility] [address] [place], [place] [address]    Special Requests   Final    BAA Blood Culture results may not be optimal due to an excessive volume of blood received in culture bottles Performed at [facility] [address], [place], [place] [address]    Culture   Final    NO GROWTH 5 DAYS Performed at [facility], [place] [address], [place], [place] [address]    Report Status 07/21/2019 FINAL  Final  Respiratory Panel by RT PCR (Flu A&B, Covid) - Nasopharyngeal Swab     Status: None   Collection Time: 07/16/19 11:03 AM   Specimen: Nasopharyngeal Swab  Result Value Ref Range Status   SARS Coronavirus 2 by RT PCR NEGATIVE NEGATIVE Final    Comment: (NOTE) SARS-CoV-2 target nucleic acids are NOT DETECTED. The SARS-CoV-2 RNA is generally detectable in upper respiratoy specimens during the acute phase of infection. The lowest concentration of SARS-CoV-2 viral copies this assay can detect is 131 copies/mL. A negative result does not preclude SARS-Cov-2 infection and  should not be used as the sole basis for treatment or other patient management decisions. A negative result may occur with  improper specimen collection/handling, submission of specimen other than nasopharyngeal swab, presence of viral mutation(s) within the areas targeted by this assay, and inadequate number of viral copies (<131 copies/mL). A negative result must be combined with clinical observations, patient history, and epidemiological information. The expected result is Negative. Fact Sheet for Patients:  PinkCheek.be Fact Sheet for Healthcare Providers:  GravelBags.it This test is not yet ap proved or cleared by the [place] FDA and  has been authorized for detection and/or diagnosis of SARS-CoV-2 by FDA under an Emergency Use Authorization (EUA). This EUA will remain  in effect (meaning this test can be used) for the duration of the COVID-19 declaration under Section 564(b)(1) of the Act, 21 U.S.C. section 360bbb-3(b)(1), unless the authorization is terminated or revoked sooner.    Influenza A by PCR NEGATIVE NEGATIVE Final   Influenza B by PCR NEGATIVE NEGATIVE Final    Comment: (NOTE)  The Xpert Xpress SARS-CoV-2/FLU/RSV assay is intended as an aid in  the diagnosis of influenza from Nasopharyngeal swab specimens and  should not be used as a sole basis for treatment. Nasal washings and  aspirates are unacceptable for Xpert Xpress SARS-CoV-2/FLU/RSV  testing. Fact Sheet for Patients: PinkCheek.be Fact Sheet for Healthcare Providers: GravelBags.it This test is not yet approved or cleared by the [place] FDA and  has been authorized for detection and/or diagnosis of SARS-CoV-2 by  FDA under an Emergency Use Authorization (EUA). This EUA will remain  in effect (meaning this test can be used) for the duration of the  Covid-19 declaration under Section  564(b)(1) of the Act, 21  U.S.C. section 360bbb-3(b)(1), unless the authorization is  terminated or revoked. Performed at [facility], [place] [address], [place], [place] [address]   Blood Culture (routine x 2)     Status: None   Collection Time: 07/16/19 11:04 AM   Specimen: BLOOD LEFT HAND  Result Value Ref Range Status   Specimen Description   Final    BLOOD LEFT HAND Performed at [facility] [address], [place], [place] [address]    Special Requests   Final    AEB Blood Culture results may not be optimal due to an inadequate volume of blood received in culture bottles Performed at [facility] [address], [place], [place] [address]    Culture   Final    NO GROWTH 5 DAYS Performed at [facility], [place] [address] [place], [place] [address]    Report Status 07/21/2019 FINAL  Final  MRSA PCR Screening     Status: None   Collection Time: 07/16/19  3:04 PM   Specimen: Nasal Mucosa; Nasopharyngeal  Result Value Ref Range Status   MRSA by PCR NEGATIVE NEGATIVE Final    Comment:        The GeneXpert MRSA Assay (FDA approved for NASAL specimens only), is one component of a comprehensive MRSA colonization surveillance program. It is not intended to diagnose MRSA infection nor to guide or monitor treatment for MRSA infections. Performed at [facility], [place] [address] [place], [place] [address]   Urine culture     Status: None   Collection Time: 07/16/19  3:51 PM   Specimen: In/Out Cath Urine  Result Value Ref Range Status   Specimen Description IN/OUT CATH URINE  Final   Special Requests   Final    NONE Performed at [facility] [address] [place], [place] [address]    Culture NO GROWTH  Final   Report Status 07/18/2019 FINAL  Final   Time spent: 70mn  SIGNED:   SMarylu Lund MD  [facility] 07/21/2019, 1:38 PM  If 7PM-7AM, please contact  night-coverage

## 2019-07-21 NOTE — Progress Notes (Signed)
SATURATION QUALIFICATIONS: (This note is used to comply with regulatory documentation for home oxygen)  Patient Saturations on Room Air at Rest = 90%  Patient Saturations on Room Air while Ambulating = 91-99%

## 2019-07-21 NOTE — TOC Transition Note (Signed)
Transition of Care [facility]) - CM/SW Discharge Note   Patient Details  Name: Sean Sharp MRN: [id number] Date of Birth: 1975/10/31  Transition of Care [facility] CM/SW Contact:  Shade Flood, LCSW Phone Number: 07/21/2019, 2:23 PM   Clinical Narrative:     Pt stable for dc per MD. Pt does not need Home O2. Spoke with pt to discuss sleep study follow up for CPAP and he states that he has a home sleep study scheduled and he expects that they will help him get the CPAP after that. He did not have any concerns related to the CPAP prior to dc and he verbalized that he did not have any other TOC needs for dc.  Final next level of care: Home/Self Care Barriers to Discharge: Barriers Resolved   Patient Goals and CMS Choice Patient states their goals for this hospitalization and ongoing recovery are:: go home      Discharge Placement                       Discharge Plan and Services   Discharge Planning Services: CM Consult                                 Social Determinants of Health (SDOH) Interventions     Readmission Risk Interventions No flowsheet data found.

## 2019-07-22 ENCOUNTER — Other Ambulatory Visit: Payer: Self-pay

## 2019-07-22 MED ORDER — PANTOPRAZOLE SODIUM 40 MG PO TBEC: DELAYED_RELEASE_TABLET | ORAL | 1 refills | Status: DC

## 2019-07-22 NOTE — Telephone Encounter (Signed)
RF request for Pantoprazole LOV:10/22/18 Next ov: advised to f/u in 6 mo. CPE Last written:09/06/18(180,1) original rx by Dr.Wert  Please advise, thanks. Medication pending

## 2019-07-22 NOTE — Telephone Encounter (Signed)
Patient refill request  pantoprazole (PROTONIX) 40 MG tablet [[id number]  Preferred pharmacy changed to  [place] [facility] [place] Please update.  Patient has scheduled appt for hospital follow - pneumonia - [facility] Discharge date 2/11

## 2019-07-22 NOTE — Telephone Encounter (Signed)
Pt notified.

## 2019-07-27 ENCOUNTER — Telehealth: Payer: Self-pay

## 2019-07-27 LAB — BLASTOMYCES ANTIGEN: Blastomyces Antigen: NOT DETECTED ng/mL

## 2019-07-27 NOTE — Telephone Encounter (Signed)
I needs more specific info before I can make any recommendations. Where on abdomen does he hurt?  Any n/v/d or constipation? Any fever?  Still eating ok? Let me know answers and I'll see if this is something I can make recommendations about.-thx

## 2019-07-27 NOTE — Telephone Encounter (Signed)
Patient is having abdominal pain. He takes Copywriter, advertising which gives him a couple of relief but then it comes back. Is there anything he can do that will help until his appt next week?

## 2019-07-27 NOTE — Telephone Encounter (Signed)
Only thing to try for gas is OTC gas-ex.

## 2019-07-27 NOTE — Telephone Encounter (Signed)
Feels like this is gas related. Pain occurs lower abdomen, no solid stools mainly loose but still able to go. Denied fever, n/v and not much of an appetite. He states when he does eat it's not a lot or eating often.

## 2019-07-27 NOTE — Telephone Encounter (Signed)
Please advise, thanks.

## 2019-07-27 NOTE — Telephone Encounter (Signed)
Patient advised of PCP recommendations. Appt moved up to Monday

## 2019-07-28 ENCOUNTER — Ambulatory Visit: Payer: BC Managed Care – PPO

## 2019-07-28 ENCOUNTER — Encounter (INDEPENDENT_AMBULATORY_CARE_PROVIDER_SITE_OTHER): Payer: BC Managed Care – PPO | Admitting: Cardiology

## 2019-07-28 DIAGNOSIS — G4733 Obstructive sleep apnea (adult) (pediatric): Secondary | ICD-10-CM

## 2019-07-28 HISTORY — DX: Obstructive sleep apnea (adult) (pediatric): G47.33

## 2019-07-28 HISTORY — PX: OTHER SURGICAL HISTORY: SHX169

## 2019-07-28 NOTE — Procedures (Signed)
Sleep Study Report  Patient Information Name: Sean Sharp  ID: [id number] Birth Date: [birth year]  Age: 44  Gender: Male BMI: 39.6 (W=308 lb, H=6' 2'') Study Date:07/28/2019 Referring Physician: Pierre Bali, MD   TEST DESCRIPTION: Home sleep apnea testing was completed using the WatchPat, a Type 1 device, utilizing peripheral arterial tonometry (PAT), chest movement, actigraphy, pulse oximetry, pulse rate, body position and snore. AHI was calculated with apnea and hypopnea using valid sleep time as the denominator. RDI includes apneas, hypopneas, and RERAs. The data acquired and the scoring of sleep and all associated events were performed in accordance with the recommended standards and specifications as outlined in the AASM Manual for the Scoring of Sleep and Associated Events 2.2.0 (2015).  FINDINGS: 1. Severe obstructive Sleep Apnea with an AHi of 84.6/hr. Most events occurred in the supine position. 2. Mild Central Sleep Apnea with a pAHIc of 4.3/hr. 3. Nocturnal Hypoxemia with a minimum O2 sat of 83% and time spent with O2 sats < 88% was 9.8 min. 4. Moderate to severe snoring. 5. Short sleep onset latency at 9 minutes. 6. Prolonged REM sleep onset at 177 minutes. 7. Number of awakenings was 11.  DIAGNOSIS: Severe Obstructive Sleep Apnea (G47.33)  RECOMMENDATIONS: 1. Clinical correlation of these findings is necessary. The decision to treat obstructive sleep apnea (OSA) is usually based on the presence of apnea symptoms or the presence of associated medical conditions such as Hypertension, Congestive Heart Failure, Atrial Fibrillation or Obesity. The most common symptoms of OSA are snoring, gasping for breath while sleeping, daytime sleepiness and fatigue.  2. Initiating apnea therapy is recommended given the presence of symptoms and/or associated conditions.  Recommend proceeding with one of the following:  a. Auto-CPAP therapy with a pressure range of 5-20cm  H2O.   b. An oral appliance (OA) that can be obtained from certain dentists with expertise in sleep medicine. These are primarily of use in non-obese patients with mild and moderate disease.   c. An ENT consultation which may be useful to look for specific causes of obstruction and possible treatment Options.   d. If patient is intolerant to PAP therapy, consider referral to ENT for evaluation for hypoglossal nerve stimulator.  3. Close follow-up is necessary to ensure success with CPAP or oral appliance therapy for maximum benefit .  4. A follow-up oximetry study on CPAP is recommended to assess the adequacy of therapy and determine the need for supplemental oxygen or the potential need for Bi-level therapy. An arterial blood gas to determine the adequacy of baseline ventilation and oxygenation should also be considered.  5. Healthy sleep recommendations include: adequate nightly sleep (normal 7-9 hrs/night), avoidance of caffeine after noon and alcohol near bedtime, and maintaining a sleep environment that is cool, dark and quiet.  6. Weight loss for overweight patients is recommended. Even modest amounts of weight loss can significantly improve the severity of sleep apnea.  7. Snoring recommendations include: weight loss where appropriate, side sleeping, and avoidance of alcohol before Bed.  8. Operation of motor vehicle or dangerous equipment must be avoided when feeling drowsy, excessively sleepy, or mentally fatigued. Report prepared by: Chauncey Cruel

## 2019-07-29 ENCOUNTER — Other Ambulatory Visit: Payer: Self-pay

## 2019-07-29 ENCOUNTER — Telehealth: Payer: Self-pay | Admitting: *Deleted

## 2019-07-29 DIAGNOSIS — G4733 Obstructive sleep apnea (adult) (pediatric): Secondary | ICD-10-CM

## 2019-07-29 NOTE — Telephone Encounter (Signed)
-----  Message from Sueanne Margarita, MD sent at 07/28/2019  6:12 PM EST ----- Please let patient know that they have sleep apnea and recommend CPAP titration. Please set up titration in the sleep lab.

## 2019-07-29 NOTE — Telephone Encounter (Signed)
Informed patient of sleep study results and patient understanding was verbalized. Patient understands his sleep study showed they have sleep apnea and recommend CPAP titration. Pt is aware and agreeable to his results. Titration sent to sleep pool.

## 2019-08-01 ENCOUNTER — Ambulatory Visit (INDEPENDENT_AMBULATORY_CARE_PROVIDER_SITE_OTHER): Payer: BC Managed Care – PPO | Admitting: Family Medicine

## 2019-08-01 ENCOUNTER — Telehealth: Payer: Self-pay | Admitting: Family Medicine

## 2019-08-01 ENCOUNTER — Other Ambulatory Visit: Payer: Self-pay

## 2019-08-01 ENCOUNTER — Encounter: Payer: Self-pay | Admitting: Family Medicine

## 2019-08-01 VITALS — BP 160/97 | HR 96 | Temp 98.5°F | Resp 16 | Ht 74.0 in | Wt 283.2 lb

## 2019-08-01 DIAGNOSIS — N179 Acute kidney failure, unspecified: Secondary | ICD-10-CM | POA: Diagnosis not present

## 2019-08-01 DIAGNOSIS — J189 Pneumonia, unspecified organism: Secondary | ICD-10-CM | POA: Diagnosis not present

## 2019-08-01 DIAGNOSIS — R7303 Prediabetes: Secondary | ICD-10-CM | POA: Diagnosis not present

## 2019-08-01 DIAGNOSIS — Z23 Encounter for immunization: Secondary | ICD-10-CM | POA: Diagnosis not present

## 2019-08-01 DIAGNOSIS — D860 Sarcoidosis of lung: Secondary | ICD-10-CM

## 2019-08-01 DIAGNOSIS — Z7952 Long term (current) use of systemic steroids: Secondary | ICD-10-CM

## 2019-08-01 LAB — CBC WITH DIFFERENTIAL/PLATELET
Basophils Absolute: 0.1 10*3/uL (ref 0.0–0.1)
Basophils Relative: 0.8 % (ref 0.0–3.0)
Eosinophils Absolute: 0.1 10*3/uL (ref 0.0–0.7)
Eosinophils Relative: 0.8 % (ref 0.0–5.0)
HCT: 39.3 % (ref 39.0–52.0)
Hemoglobin: 12.3 g/dL — ABNORMAL LOW (ref 13.0–17.0)
Lymphocytes Relative: 5.5 % — ABNORMAL LOW (ref 12.0–46.0)
Lymphs Abs: 0.5 10*3/uL — ABNORMAL LOW (ref 0.7–4.0)
MCHC: 31.4 g/dL (ref 30.0–36.0)
MCV: 69.7 fl — ABNORMAL LOW (ref 78.0–100.0)
Monocytes Absolute: 0.9 10*3/uL (ref 0.1–1.0)
Monocytes Relative: 9.5 % (ref 3.0–12.0)
Neutro Abs: 7.7 10*3/uL (ref 1.4–7.7)
Neutrophils Relative %: 83.4 % — ABNORMAL HIGH (ref 43.0–77.0)
Platelets: 319 10*3/uL (ref 150.0–400.0)
RBC: 5.64 Mil/uL (ref 4.22–5.81)
RDW: 18.6 % — ABNORMAL HIGH (ref 11.5–15.5)
WBC: 9.2 10*3/uL (ref 4.0–10.5)

## 2019-08-01 MED ORDER — PANTOPRAZOLE SODIUM 40 MG PO TBEC: DELAYED_RELEASE_TABLET | ORAL | 1 refills | Status: DC

## 2019-08-01 NOTE — Progress Notes (Signed)
08/01/2019  CC:  Chief Complaint  Patient presents with  . Hospitalization Follow-up    Patient is a 44 y.o. African-American male who presents for  hospital follow up. Dates hospitalized: 2/6-2/11, 2021. Days since d/c from hospital: 11 Patient was discharged from hospital to home. Reason for admission to hospital: cough and SOB, found to have sepsis secondary to ACP/cavitary pneumonia in the context of his active pulmonary sarcoidosis.  I have reviewed patient's discharge summary plus pertinent specific notes, labs, and imaging from the hospitalization.   D/c'd home OFF aldactone due to AKI in hosp but sCr was near baseline upon d/c. Went home to finish 8 more days of levaquin.  Continued on chronic steroids for sarcoidosis; 55m qd upon d/c.  SARS-CoV-2, NAA NEG, flu a/b neg, blood clx neg x 2.  Urine normal. Legionella urine antigen POS.   Improving gradually, still with some SOB and easy fatiquing, some productive coughing. Sometimes swallows his mucous.  Lots of lower abd bloating still.  No n/v/d or constipation. No fevers.  Appetite still pretty low.    Medication reconciliation was done today and patient is taking meds as recommended by discharging hospitalist/specialist.    PMH:  Past Medical History:  Diagnosis Date  . Allergic rhinitis   . 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  Hemoglobin electrophoresis normal 2016.  .Marland KitchenChronic renal insufficiency, stage II (mild)   . Cutaneous sarcoidosis    [place] rheum: as of 02/2019->plaquenil.  Cont plaquenil and slowly tapering prednisone as of 06/2019 rheum f/u  . Hoarseness 2015   [facility]normal l[facility]oscopy: prednisone taper and bid nexium recommended.  No signif help [facility]   . HTN (hypertension)   . Hydrocele    left  . LPRD (laryngopharyngeal reflux disease)   . Obesity, Class II, BMI 35-39.9   . Olecranon bursitis of right elbow 04/29/2018   persistent/recurrent s/p aspiration-->bursa  excision   . Peripheral edema    left leg>right leg  . Prediabetes 02/2017   A1c 6.3% (right when he was starting to take prednisone prn for treatment of his sarcoidosis).  HbA1c 08/2017=6.3%.  . Pulmonary arterial hypertension (HCC)    Mild (R heart cath 06/27/19), echo 06/2019 normal.  Wt loss and CPAP recommended  . 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 WMelvyn Novassuspects it was smoldering since 2011.  Skin bx of cutaneous lesion confirmed dx of sarcoidosis 02/2017.  Pt improving fall 2018 with plaquenil and prednisone.  Pt self d/c'd plaquenil, takes pred prn per his own judgement, pt commonly takes meds incorrectly/not as recommended by MDs.  . Vocal cord polyps 2015   surgery [facility]     PSH:  Past Surgical History:  Procedure Laterality Date  . OLECRANON BURSECTOMY Right 2019  . RIGHT HEART CATH N/A 06/27/2019   Mild PAH. Procedure: RIGHT HEART CATH;  Surgeon: BJolaine Artist MD;  [facility];  Service: Cardiovascular;  Laterality: N/A;  . 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  . SKIN GRAFT  2010   on left index finger  . TRANSTHORACIC ECHOCARDIOGRAM  06/27/2019   NORMAL  . [facility]  . WISDOM TOOTH EXTRACTION  2005    MEDS:  Outpatient Medications Prior to Visit  Medication Sig Dispense Refill  . amLODipine (NORVASC) 10 MG tablet TAKE 1 TABLET BY MOUTH EVERY DAY (Patient  taking differently: Take 10 mg by mouth daily. ) 90 tablet 1  . Clocortolone Pivalate (CLODERM) 0.1 % cream Apply 1 application topically 2 (two) times daily as needed (Sarcoidosis/rash).     Marland Kitchen HYDROcodone-homatropine (HYCODAN) 5-1.5 MG/5ML syrup Take 5 mLs by mouth every 6 (six) hours as needed for cough. 90 mL 0  . hydrocortisone butyrate (LUCOID) 0.1 % CREA cream Apply 1 application topically 2 (two) times daily. (Patient taking differently: Apply 1 application topically 2 (two) times daily as needed (Sarcoidosis). ) 60 g 6   . hydroxychloroquine (PLAQUENIL) 200 MG tablet Take 2 daily (Patient taking differently: Take 400 mg by mouth daily. )  0  . metoprolol succinate (TOPROL-XL) 25 MG 24 hr tablet Take 1.5 tablets (37.5 mg total) by mouth daily. 45 tablet 0  . pantoprazole (PROTONIX) 40 MG tablet Take 30- 60 min before your first and last meals of the day 180 tablet 1  . predniSONE (DELTASONE) 10 MG tablet Take 2 tablets (20 mg total) by mouth daily. 60 tablet 0  . dextromethorphan-guaiFENesin (MUCINEX DM) 30-600 MG 12hr tablet Take 2 tablets by mouth 2 (two) times daily. (Patient not taking: Reported on 08/01/2019) 120 tablet 0  . traMADol (ULTRAM) 50 MG tablet Take 1 tablet (50 mg total) by mouth every 6 (six) hours as needed for moderate pain. (Patient not taking: Reported on 08/01/2019) 20 tablet 0  . traZODone (DESYREL) 50 MG tablet 1-2 tabs po qhs prn insomnia (Patient not taking: Reported on 08/01/2019) 30 tablet 1   No facility-administered medications prior to visit.   EXAM: BP (!) 160/97 (BP Location: Left Arm, Patient Position: Sitting, Cuff Size: Large)   Pulse 96   Temp 98.5 F (36.9 C) (Temporal)   Resp 16   Ht _0  (1.88 m)   Wt 283 lb 3.2 oz (128.5 kg)   SpO2 94%   BMI 36.36 kg/m   Gen: Alert, well appearing.  Patient is oriented to person, place, time, and situation. AFFECT: pleasant, lucid thought and speech. ENT: clear rhinorrhea with some PND noted. Throat w/out erythema or swelling or focal lesion. CV: RRR, no m/r/g.   LUNGS: CTA bilat, nonlabored resps, symmetric BS, normal aeration diffusely. EXT: no clubbing or cyanosis.  no edema.    Pertinent labs/imaging Lab Results  Component Value Date   TSH 2.75 04/20/2018   Lab Results  Component Value Date   WBC 18.2 (H) 07/21/2019   HGB 12.6 (L) 07/21/2019   HCT 41.8 07/21/2019   MCV 71.0 (L) 07/21/2019   PLT 308 07/21/2019   Lab Results  Component Value Date   CREATININE 1.11 07/21/2019   BUN 17 07/21/2019   NA 138  07/21/2019   K 4.4 07/21/2019   CL 102 07/21/2019   CO2 26 07/21/2019   Lab Results  Component Value Date   ALT 41 07/18/2019   AST 50 (H) 07/18/2019   ALKPHOS 85 07/18/2019   BILITOT 0.5 07/18/2019   Lab Results  Component Value Date   CHOL 191 04/20/2018   Lab Results  Component Value Date   HDL 40.70 04/20/2018   Lab Results  Component Value Date   LDLCALC 131 (H) 04/20/2018   Lab Results  Component Value Date   TRIG 258 (H) 07/16/2019   Lab Results  Component Value Date   CHOLHDL 5 04/20/2018   Lab Results  Component Value Date   HGBA1C 6.2 04/20/2018   Lab Results  Component Value Date   ESRSEDRATE 75 (H)  07/18/2019    ASSESSMENT/PLAN:  1) CAP in the setting of active pulmonary sarcoidosis. Gradually improving, finished abx. No new imaging or meds today. Check CBC and BMET today. Pneumovax 23 today. Short term disability paperwork return to work date 08/22/19.  2) HTN: has not taken bp med today. No med changes but plan restart aldactone if BMET stable today.  3) Chronic rhinitis with PND. He'll restart zyrtec for PND.  Says dymista did not really help.  4) GERD/LPR as well as abd bloating. Continue PPI bid, gas-ex prn.  5) AKI on CRI II: monitor bmet. If sCr stable and K normal then will restart his aldactone 12.55m qd.  6) Prediabetes: has been on chronic systemic steroids. Will monitor HbA1c today.  Medical decision making of moderate complexity was utilized today.  FOLLOW UP:  2 wks  Signed:  PCrissie Sickles MD           08/01/2019

## 2019-08-01 NOTE — Telephone Encounter (Signed)
Placed on PCP desk to review and sign, if appropriate.

## 2019-08-01 NOTE — Telephone Encounter (Signed)
Received Short Tem Disability ppw from pt. Printed and given to [place].

## 2019-08-02 ENCOUNTER — Encounter: Payer: Self-pay | Admitting: Family Medicine

## 2019-08-02 ENCOUNTER — Inpatient Hospital Stay: Payer: BC Managed Care – PPO | Admitting: Family Medicine

## 2019-08-02 LAB — COMPREHENSIVE METABOLIC PANEL
ALT: 25 U/L (ref 0–53)
AST: 20 U/L (ref 0–37)
Albumin: 3.3 g/dL — ABNORMAL LOW (ref 3.5–5.2)
Alkaline Phosphatase: 80 U/L (ref 39–117)
BUN: 14 mg/dL (ref 6–23)
CO2: 28 mEq/L (ref 19–32)
Calcium: 9.3 mg/dL (ref 8.4–10.5)
Chloride: 102 mEq/L (ref 96–112)
Creatinine, Ser: 1.06 mg/dL (ref 0.40–1.50)
GFR: 91.84 mL/min (ref >60.00)
Glucose, Bld: 85 mg/dL (ref 70–99)
Potassium: 4.5 mEq/L (ref 3.5–5.1)
Sodium: 138 mEq/L (ref 135–145)
Total Bilirubin: 0.5 mg/dL (ref 0.2–1.2)
Total Protein: 6.7 g/dL (ref 6.0–8.3)

## 2019-08-04 ENCOUNTER — Encounter: Payer: Self-pay | Admitting: Family Medicine

## 2019-08-08 ENCOUNTER — Telehealth: Payer: Self-pay | Admitting: *Deleted

## 2019-08-08 NOTE — Telephone Encounter (Signed)
Staff message sent to Gae Bon per [employer] portal no PA is required. Ok to schedule sleep study.

## 2019-08-11 DIAGNOSIS — D869 Sarcoidosis, unspecified: Secondary | ICD-10-CM | POA: Diagnosis not present

## 2019-08-11 DIAGNOSIS — R0602 Shortness of breath: Secondary | ICD-10-CM | POA: Diagnosis not present

## 2019-08-11 DIAGNOSIS — I2721 Secondary pulmonary arterial hypertension: Secondary | ICD-10-CM | POA: Diagnosis not present

## 2019-08-11 DIAGNOSIS — J310 Chronic rhinitis: Secondary | ICD-10-CM | POA: Diagnosis not present

## 2019-08-15 ENCOUNTER — Other Ambulatory Visit: Payer: Self-pay

## 2019-08-15 ENCOUNTER — Telehealth: Payer: Self-pay | Admitting: *Deleted

## 2019-08-15 ENCOUNTER — Encounter: Payer: Self-pay | Admitting: Family Medicine

## 2019-08-15 ENCOUNTER — Ambulatory Visit: Payer: BC Managed Care – PPO | Admitting: Family Medicine

## 2019-08-15 VITALS — BP 161/94 | HR 74 | Temp 98.1°F | Resp 16 | Ht 74.0 in | Wt 281.8 lb

## 2019-08-15 DIAGNOSIS — J189 Pneumonia, unspecified organism: Secondary | ICD-10-CM | POA: Diagnosis not present

## 2019-08-15 DIAGNOSIS — R7303 Prediabetes: Secondary | ICD-10-CM

## 2019-08-15 DIAGNOSIS — N179 Acute kidney failure, unspecified: Secondary | ICD-10-CM

## 2019-08-15 DIAGNOSIS — N182 Chronic kidney disease, stage 2 (mild): Secondary | ICD-10-CM

## 2019-08-15 DIAGNOSIS — D869 Sarcoidosis, unspecified: Secondary | ICD-10-CM

## 2019-08-15 DIAGNOSIS — I10 Essential (primary) hypertension: Secondary | ICD-10-CM

## 2019-08-15 LAB — POCT GLYCOSYLATED HEMOGLOBIN (HGB A1C)
HbA1c POC (<> result, manual entry): 5.8 % (ref 4.0–5.6)
HbA1c, POC (controlled diabetic range): 5.8 % (ref 0.0–7.0)
HbA1c, POC (prediabetic range): 5.8 % (ref 5.7–6.4)
Hemoglobin A1C: 5.8 % — AB (ref 4.0–5.6)

## 2019-08-15 MED ORDER — SPIRONOLACTONE 25 MG PO TABS: 25.0000 mg | ORAL_TABLET | Freq: Every day | ORAL | 1 refills | Status: DC

## 2019-08-15 NOTE — Telephone Encounter (Signed)
Patient is scheduled for CPAP Titration on 09/01/19. pt is scheduled for COVID screening on 08/30/19 11:35 prior to titration.  Patient understands his titration study will be done at [facility] sleep lab. Patient understands he will receive a letter in a week or so detailing appointment, date, time, and location. Patient understands to call if he does not receive the letter  in a timely manner. Patient agrees with treatment and thanked me for call.

## 2019-08-15 NOTE — Patient Instructions (Signed)
Check your blood pressure 3-4 times per week and write the numbers down to review with me in 2 wks.

## 2019-08-15 NOTE — Telephone Encounter (Signed)
-----  Message from Lauralee Evener, [place] sent at 08/08/2019  3:48 PM EST ----- Regarding: RE: precert Per BCBS web portal no PA is required ok to schedule. ----- Message ----- From: Freada Bergeron, CMA Sent: 07/29/2019   4:15 PM EST To: Cv Div Sleep Studies Subject: precert                                        recommend CPAP titration.

## 2019-08-15 NOTE — Telephone Encounter (Signed)
RE: precert Lauralee Evener, CMA  Freada Bergeron, CMA  Per BCBS web portal no PA is required ok to schedule.

## 2019-08-15 NOTE — Progress Notes (Signed)
OFFICE VISIT  08/15/2019   CC:  Chief Complaint  Patient presents with  . Follow-up    hypertension,    HPI:    Patient is a 44 y.o. 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  A/P as of last visit: ") CAP in the setting of active pulmonary sarcoidosis. Gradually improving, finished abx. No new imaging or meds today. Check CBC and BMET today. Pneumovax 23 today. Short term disability paperwork return to work date 08/22/19.  2) HTN: has not taken bp med today. No med changes but plan restart aldactone if BMET stable today.  3) Chronic rhinitis with PND. He'll restart zyrtec for PND.  Says dymista did not really help.  4) GERD/LPR as well as abd bloating. Continue PPI bid, gas-ex prn.  5) AKI on CRI II: monitor bmet. If sCr stable and K normal then will restart his aldactone 12.35m qd.  6) Prediabetes: has been on chronic systemic steroids. Will monitor HbA1c at next f/u visit.  Interim hx: Feeling fine.  Since last visit has had minimal coughing w/out significant mucous production.  No DOE with normal activity, energy level is fine. Says ? ED when on spironolactone and this resolved with d/c aldactone.  He admits there were other potential factors contributing.  Prediab: he had been focusing on making some lower carb dietary changes and use of treadmill up until the time he got sick this year.  His derm is getting him in with a new pulm provider at some point in the near future but he has no specifics at this time. He has not checked his bp anywhere since last visit here.  ROS: no fevers, no CP, no SOB at rest, no wheezing, no dizziness, no HAs, no melena/hematochezia.  No polyuria or polydipsia.  No myalgias or arthralgias.  No n/v.  No LE swelling.  NO thrush.  Past Medical History:  Diagnosis Date  . Allergic rhinitis   . 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  Hemoglobin  electrophoresis normal 2016.  .Marland KitchenChronic renal insufficiency, stage II (mild)   . Cutaneous sarcoidosis    [place] rheum: as of 02/2019->plaquenil.  Cont plaquenil and slowly tapering prednisone as of 06/2019 rheum f/u  . Hoarseness 2015   [facility]: prednisone taper and bid nexium recommended.  [facility] sign[facility]   . HTN (hypertension)   . Hydrocele    left  . LPRD (laryngopharyngeal reflux disease)   . Obesity, Class II, BMI 35-39.9   . Olecranon bursitis of right elbow 04/29/2018   persistent/recurrent s/p aspiration-->bursa excision   . OSA (obstructive sleep apnea) 07/28/2019   SEVERE OSA, mild central sleep apnea, CPAP recommended (Dr. TFransico Him  . Peripheral edema    left leg>right leg  . Prediabetes 02/2017   A1c 6.3% (right when he was starting to take prednisone prn for treatment of his sarcoidosis).  HbA1c 08/2017=6.3%.  . Pulmonary arterial hypertension (HCC)    Mild (R heart cath 06/27/19), echo 06/2019 normal.  Wt loss and CPAP recommended  . S[place]oidosis of lung ([place]   2018, but Dr. WMelvyn Novassuspects it was smoldering since 2011.  Skin bx of cutaneous lesion confirmed dx of sarcoidosis 02/2017.  Pt improving fall 2018 with plaquenil and prednisone.  Pt self d/c'd plaquenil.  . Vocal cord polyps 2015   surgery [facility]  Past Surgical History:  Procedure Laterality Date  . OLECRANON BURSECTOMY Right 2019  . Polysomnogram  07/28/2019   Severe OSA w/hypoxia, mild central sleep apnea, CPAP recommended (Dr. Fransico Him)  . RIGHT HEART CATH N/A 06/27/2019   Mild PAH. Procedure: RIGHT HEART CATH;  Surgeon: Jolaine Artist, MD;  Location: [place] [facility];  Service: Cardiovascular;  Laterality: N/A;  . 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  . SKIN GRAFT  2010   on left index finger  . TRANSTHORACIC ECHOCARDIOGRAM  06/27/2019   NORMAL  . Vocal cord surgery  2016   [facility]  . [place] EXTRACTION  2005    Outpatient Medications Prior to Visit  Medication Sig Dispense Refill  . amLODipine (NORVASC) 10 MG tablet TAKE 1 TABLET BY MOUTH EVERY DAY (Patient taking differently: Take 10 mg by mouth daily. ) 90 tablet 1  . Cetirizine HCl (ZYRTEC PO) Take 10 mg by mouth daily.    . Clocortolone Pivalate (CLODERM) 0.1 % cream Apply 1 application topically 2 (two) times daily as needed (Sarcoidosis/rash).     . hydroxychloroquine (PLAQUENIL) 200 MG tablet Take 2 daily (Patient taking differently: Take 400 mg by mouth daily. )  0  . metoprolol succinate (TOPROL-XL) 25 MG 24 hr tablet Take 1.5 tablets (37.5 mg total) by mouth daily. 45 tablet 0  . pantoprazole (PROTONIX) 40 MG tablet 1 tab po bid 180 tablet 1  . predniSONE (DELTASONE) 10 MG tablet Take 2 tablets (20 mg total) by mouth daily. 60 tablet 0  . dextromethorphan-guaiFENesin (MUCINEX DM) 30-600 MG 12hr tablet Take 2 tablets by mouth 2 (two) times daily. (Patient not taking: Reported on 08/15/2019) 120 tablet 0  . HYDROcodone-homatropine (HYCODAN) 5-1.5 MG/5ML syrup Take 5 mLs by mouth every 6 (six) hours as needed for cough. (Patient not taking: Reported on 08/15/2019) 90 mL 0  . hydrocortisone butyrate (LUCOID) 0.1 % CREA cream Apply 1 application topically 2 (two) times daily. (Patient not taking: Reported on 08/15/2019) 60 g 6  . traMADol (ULTRAM) 50 MG tablet Take 1 tablet (50 mg total) by mouth every 6 (six) hours as needed for moderate pain. (Patient not taking: Reported on 08/01/2019) 20 tablet 0  . traZODone (DESYREL) 50 MG tablet 1-2 tabs po qhs prn insomnia (Patient not taking: Reported on 08/01/2019) 30 tablet 1   No facility-administered medications prior to visit.    Allergies  Allergen Reactions  . Bee Venom Swelling    ROS As per HPI  PE: bp repeat 132/90 manually at end of visit Blood pressure (!) 161/94, pulse 74, temperature 98.1 F (36.7 C), temperature source Temporal, resp. rate 16, height _0  (1.88  m), weight 281 lb 12.8 oz (127.8 kg), SpO2 91 %. Body mass index is 36.18 kg/m.  Gen: Alert, well appearing.  Patient is oriented to person, place, time, and situation. AFFECT: pleasant, lucid thought and speech. RXY:VOPF: no injection, icteris, swelling, or exudate.  EOMI, PERRLA. Mouth: lips without lesion/swelling.  Oral mucosa pink and moist. Oropharynx without erythema, exudate, or swelling.  CV: RRR, no m/r/g.   LUNGS: CTA bilat, nonlabored resps, good aeration in all lung fields. EXT: no clubbing or cyanosis.  no edema.    LABS:   Lab Results  Component Value Date   ESRSEDRATE 75 (H) 07/18/2019   Lab Results  Component Value Date   CRP 46.1 (H) 07/16/2019    Lab Results  Component Value Date  TSH 2.75 04/20/2018   Lab Results  Component Value Date   WBC 9.2 08/01/2019   HGB 12.3 (L) 08/01/2019   HCT 39.3 08/01/2019   MCV 69.7 Repeated and verified X2. (L) 08/01/2019   PLT 319.0 08/01/2019   Lab Results  Component Value Date   CREATININE 1.06 08/01/2019   BUN 14 08/01/2019   NA 138 08/01/2019   K 4.5 08/01/2019   CL 102 08/01/2019   CO2 28 08/01/2019   Lab Results  Component Value Date   ALT 25 08/01/2019   AST 20 08/01/2019   ALKPHOS 80 08/01/2019   BILITOT 0.5 08/01/2019   Lab Results  Component Value Date   CHOL 191 04/20/2018   Lab Results  Component Value Date   HDL 40.70 04/20/2018   Lab Results  Component Value Date   LDLCALC 131 (H) 04/20/2018   Lab Results  Component Value Date   TRIG 258 (H) 07/16/2019   Lab Results  Component Value Date   CHOLHDL 5 04/20/2018   Lab Results  Component Value Date   PSA 0.79 08/25/2014   Lab Results  Component Value Date   HGBA1C 5.8 (A) 08/15/2019   HGBA1C 5.8 08/15/2019   HGBA1C 5.8 08/15/2019   HGBA1C 5.8 08/15/2019   POC A1c today 5.8%  IMPRESSION AND PLAN:  1)  CAP in the setting of active pulmonary sarcoidosis. Resolving appropriately, essentially back to baseline state of  health. Continue on plaquenil and prednisone, med mgmt for this via his derm, rheum, and pulm providers. He's utd on pneumonvax 23.  2) HTN: recent AKI on CRI 2 while ill recently: BMET good last visit. Restart spironolactone at 30m qd dose.  3) Prediabetes: A1c improved to 5.8% today. TLC habits emphasized.  An After Visit Summary was printed and given to the patient.  FOLLOW UP: Return in about 4 weeks (around 09/12/2019) for f/u HTN.  Signed:  PCrissie Sickles MD           08/15/2019

## 2019-08-22 ENCOUNTER — Ambulatory Visit (HOSPITAL_COMMUNITY)
Admission: RE | Admit: 2019-08-22 | Discharge: 2019-08-22 | Disposition: A | Discharge status: Home / Self Care | Payer: BC Managed Care – PPO | Source: Ambulatory Visit | Attending: Internal Medicine | Admitting: Internal Medicine

## 2019-08-22 ENCOUNTER — Encounter (HOSPITAL_COMMUNITY): Payer: Self-pay | Admitting: Internal Medicine

## 2019-08-22 ENCOUNTER — Other Ambulatory Visit: Payer: Self-pay

## 2019-08-22 VITALS — BP 143/100 | HR 85 | Wt 286.4 lb

## 2019-08-22 DIAGNOSIS — I10 Essential (primary) hypertension: Secondary | ICD-10-CM | POA: Diagnosis not present

## 2019-08-22 DIAGNOSIS — N182 Chronic kidney disease, stage 2 (mild): Secondary | ICD-10-CM | POA: Insufficient documentation

## 2019-08-22 DIAGNOSIS — E1122 Type 2 diabetes mellitus with diabetic chronic kidney disease: Secondary | ICD-10-CM | POA: Insufficient documentation

## 2019-08-22 DIAGNOSIS — Z79899 Other long term (current) drug therapy: Secondary | ICD-10-CM | POA: Diagnosis not present

## 2019-08-22 DIAGNOSIS — K219 Gastro-esophageal reflux disease without esophagitis: Secondary | ICD-10-CM | POA: Insufficient documentation

## 2019-08-22 DIAGNOSIS — Z87891 Personal history of nicotine dependence: Secondary | ICD-10-CM | POA: Insufficient documentation

## 2019-08-22 DIAGNOSIS — I129 Hypertensive chronic kidney disease with stage 1 through stage 4 chronic kidney disease, or unspecified chronic kidney disease: Secondary | ICD-10-CM | POA: Diagnosis not present

## 2019-08-22 DIAGNOSIS — Z7952 Long term (current) use of systemic steroids: Secondary | ICD-10-CM | POA: Diagnosis not present

## 2019-08-22 DIAGNOSIS — G4733 Obstructive sleep apnea (adult) (pediatric): Secondary | ICD-10-CM | POA: Insufficient documentation

## 2019-08-22 DIAGNOSIS — I4510 Unspecified right bundle-branch block: Secondary | ICD-10-CM | POA: Insufficient documentation

## 2019-08-22 DIAGNOSIS — D869 Sarcoidosis, unspecified: Secondary | ICD-10-CM | POA: Insufficient documentation

## 2019-08-22 DIAGNOSIS — Z9119 Patient's noncompliance with other medical treatment and regimen: Secondary | ICD-10-CM | POA: Diagnosis not present

## 2019-08-22 DIAGNOSIS — I272 Pulmonary hypertension, unspecified: Secondary | ICD-10-CM | POA: Insufficient documentation

## 2019-08-22 NOTE — Progress Notes (Signed)
ADVANCED HF CLINIC NOTE  Referring Physician: Marella Chimes PA [facility]) Primary Care: [facility] Primary Cardiologist: New  HPI:  44 y/o morbidly obese male with HTN, sarcoidosis, borderline DM2, OSA (noncompliant with CPAP) referred by Marella Chimes, PA for further evaluation of [place] in the setting of sarcoidosis.   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 Melvyn Novas. . Now on prednisone with plaquenil daily with Rheumatology. Recently had chest CT which showed dilated PA.  Has OSA and has CPAP but non compliant with it because he doesn't feel like it works. Does not have a sleep apnea doc.  Graduated HS at 185 pounds. Smoked minimally quit 1997. No h/o DVT/PE.  I first saw him in 1/21 for dilated pulmonary artery and suspicion of PAH.  RHC showed just mildly elevated PAR pressures with normal PCWP and PVR. PFTs ordered but deferred due to [place]. Sleep study severe OSA AHI was 84.6 (!). Pending titration study next week.  Was walking on TM for about 1 mile at a time but then got hospitalized with PNA. Discharged 07/21/19. Now feeling better getting strength back. Breathing better. No swelling or dizziness. Lost 30 pounds.   RHC 1/21 RA = 9 RV = 36/12 PA = 38/6 (24) PCW = 11 Fick cardiac output/index = 7.1/2.7 PVR = 1.9 WU FA sat = 98% PA sat = 72%, 75% SVC sat = 75%  Echo 1/21 EF 60-65% RV ok not enough TR to estimate RVSP   CT 10/20 1. Mediastinal, hilar and pulmonary parenchymal changes of sarcoid, the latter of which appear mildly progressive from 01/05/2017. 2. Enlarged pulmonic trunk, indicative of pulmonary arterial hypertension.   Past Medical History:  Diagnosis Date  . Allergic rhinitis   . 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  Hemoglobin electrophoresis normal 2016.  Marland Kitchen Chronic renal insufficiency, stage II (mild)   . Cutaneous sarcoidosis    [place] rheum: as of 02/2019->plaquenil.  Cont plaquenil and slowly tapering prednisone  as of 06/2019 rheum f/u  . Hoarseness 2015   [place] [facility] 04/2014-normal laryngoscopy: prednisone taper and bid nexium recommended.  No signif help so pt got 2nd opinion at [facility]   . HTN (hypertension)   . Hydrocele    left  . LPRD (laryngopharyngeal reflux disease)   . Obesity, Class II, BMI 35-39.9   . Olecranon bursitis of right elbow 04/29/2018   persistent/recurrent s/p aspiration-->bursa excision   . OSA (obstructive sleep apnea) 07/28/2019   SEVERE OSA, mild central sleep apnea, CPAP recommended (Dr. Fransico Him)  . Peripheral edema    left leg>right leg  . Prediabetes 02/2017   A1c 6.3% (right when he was starting to take prednisone prn for treatment of his sarcoidosis).  HbA1c 08/2017=6.3%.  . Pulmonary arterial hypertension (HCC)    Mild (R heart cath 06/27/19), echo 06/2019 normal.  Wt loss and CPAP recommended  . Sarcoidosis of lung ([place]) 12/2016   2018, but Dr. Melvyn Novas suspects it was smoldering since 2011.  Skin bx of cutaneous lesion confirmed dx of sarcoidosis 02/2017.  Pt improving fall 2018 with plaquenil and prednisone.  Pt self d/c'd plaquenil.  . Vocal cord polyps 2015   surgery [facility]     Current Outpatient Medications  Medication Sig Dispense Refill  . amLODipine (NORVASC) 10 MG tablet Take 10 mg by mouth daily.    . Cetirizine HCl (ZYRTEC PO) Take 10 mg by mouth daily.    . Clocortolone Pivalate (CLODERM) 0.1 %  cream Apply 1 application topically 2 (two) times daily as needed (Sarcoidosis/rash).     . hydrocortisone butyrate (LUCOID) 0.1 % CREA cream Apply 1 application topically 2 (two) times daily. 60 g 6  . hydroxychloroquine (PLAQUENIL) 200 MG tablet Take 400 mg by mouth daily.    . metoprolol succinate (TOPROL-XL) 25 MG 24 hr tablet Take 1.5 tablets (37.5 mg total) by mouth daily. 45 tablet 0  . pantoprazole (PROTONIX) 40 MG tablet 1 tab po bid 180 tablet 1  . predniSONE (DELTASONE) 5 MG tablet Take 15 mg by mouth daily.    Marland Kitchen spironolactone (ALDACTONE) 25 MG tablet  Take 1 tablet (25 mg total) by mouth daily. 30 tablet 1  . traMADol (ULTRAM) 50 MG tablet Take 1 tablet (50 mg total) by mouth every 6 (six) hours as needed for moderate pain. 20 tablet 0  . traZODone (DESYREL) 50 MG tablet 1-2 tabs po qhs prn insomnia 30 tablet 1   No current facility-administered medications for this encounter.    Allergies  Allergen Reactions  . Bee Venom Swelling      Social History   Socioeconomic History  . Marital status: Married    Spouse name: Not on file  . Number of children: 2  . Years of education: Not on file  . Highest education level: Not on file  Occupational History  . Occupation: Clinical biochemist  Tobacco Use  . Smoking status: Former Smoker    Packs/day: 0.30    Years: 1.50    Pack years: 0.45    Types: Cigarettes    Quit date: 06/10/1995    Years since quitting: 24.2  . Smokeless tobacco: Never Used  Substance and Sexual Activity  . Alcohol use: Yes    Comment: occasional  . Drug use: No  . Sexual activity: Not on file  Other Topics Concern  . Not on file  Social History Narrative   Married, 2 children (32 and 19 y/o).   Occupation: Clinical biochemist Editor, commissioning).   Orig from [employer].   No Tobacco.  Rare alcohol.  No drugs.   Enjoys fishing, Environmental health practitioner, wood working, Research officer, trade union.      Social Determinants of Health   Financial Resource Strain:   . Difficulty of Paying Living Expenses:   Food Insecurity:   . Worried About Charity fundraiser in the Last Year:   . Arboriculturist in the Last Year:   Transportation Needs:   . Film/video editor (Medical):   Marland Kitchen Lack of Transportation (Non-Medical):   Physical Activity:   . Days of Exercise per Week:   . Minutes of Exercise per Session:   Stress:   . Feeling of Stress :   Social Connections:   . Frequency of Communication with Friends and Family:   . Frequency of Social Gatherings with Friends and Family:   . Attends Religious Services:   . Active Member of  Clubs or Organizations:   . Attends Archivist Meetings:   Marland Kitchen Marital Status:   Intimate Partner Violence:   . Fear of Current or Ex-Partner:   . Emotionally Abused:   Marland Kitchen Physically Abused:   . Sexually Abused:       Family History  Problem Relation Age of Onset  . Sarcoidosis Father        ?  Marland Kitchen Lactose intolerance Father   . Other Mother        colon issues-portion of colon removed  . Lactose intolerance  Mother   . Cancer Paternal Grandfather        ?    Vitals:   08/22/19 1451  BP: (!) 143/100  Pulse: 85  SpO2: 97%  Weight: 129.9 kg (286 lb 6.4 oz)    PHYSICAL EXAM: General:  Well appearing. No resp difficulty HEENT: normal Neck: supple. no JVD. Carotids 2+ bilat; no bruits. No lymphadenopathy or thryomegaly appreciated. Cor: PMI nondisplaced. Regular rate & rhythm. No rubs, gallops or murmurs. Lungs: clear Abdomen: obese soft, nontender, nondistended. No hepatosplenomegaly. No bruits or masses. Good bowel sounds. Extremities: no cyanosis, clubbing, rash, edema Neuro: alert & orientedx3, cranial nerves grossly intact. moves all 4 extremities w/o difficulty. Affect pleasant   ECG: NSR 82 RBBB No ST-T wave abnormalities.     ASSESSMENT & PLAN:  1. Enlarged pulmonary artery in setting of sarcoid and OSA - echo reviewed personally. No clear evidence of PAH or RV strain - RHC with very mild PAH with normal PVR - PSG with very severe OSA (AHI 84). Suspect this is main cause of PAH.  Pending CPAP titration next week.  - Plan: CPAP, continue weight loss - Check PFTs  2. OSA - PSG 1/21 with very severe OSA (AHI 84). Suspect this is main cause of PAH.  Pending CPAP titration next week.   3. HTN - elevated here but hasn't had meds yet today - followed by Dr. Melford Aase - recommended home BP monitoring with goal < 140/90   4. Morbid obesity - discussed [employer] - weight down 30 pounds.  - keep up the good work   Glori Bickers, MD  3:12  PM

## 2019-08-22 NOTE — Addendum Note (Signed)
Encounter addended by: Scarlette Calico, RN on: 08/22/2019 3:30 PM  Actions taken: Clinical Note Signed

## 2019-08-22 NOTE — Patient Instructions (Signed)
Check your BP daily  Continue weight loss  Please call our office in September to schedule your follow up appointment  If you have any questions or concerns before your next appointment please send [place] a message through [place] or call our office at [phone].  At the [facility], you and your health needs are our priority. As part of our continuing mission to provide you with exceptional heart care, we have created designated Provider Care Teams. These Care Teams include your primary Cardiologist (physician) and Advanced Practice Providers (APPs- Physician Assistants and Nurse Practitioners) who all work together to provide you with the care you need, when you need it.   You may see any of the following providers on your designated Care Team at your next follow up: Marland Kitchen Dr Glori Bickers . Dr Loralie Champagne . Darrick Grinder, NP . Lyda Jester, PA . Audry Riles, PharmD   Please be sure to bring in all your medications bottles to every appointment.

## 2019-08-30 ENCOUNTER — Other Ambulatory Visit (HOSPITAL_COMMUNITY)
Admission: RE | Admit: 2019-08-30 | Discharge: 2019-08-30 | Disposition: A | Discharge status: Home / Self Care | Payer: BC Managed Care – PPO | Source: Ambulatory Visit | Attending: Cardiology | Admitting: Cardiology

## 2019-08-30 DIAGNOSIS — Z01812 Encounter for preprocedural laboratory examination: Secondary | ICD-10-CM | POA: Diagnosis not present

## 2019-08-30 DIAGNOSIS — Z20822 Contact with and (suspected) exposure to covid-19: Secondary | ICD-10-CM | POA: Insufficient documentation

## 2019-08-30 LAB — SARS CORONAVIRUS 2 (TAT 6-24 HRS): SARS Coronavirus 2: NEGATIVE

## 2019-09-01 ENCOUNTER — Ambulatory Visit (HOSPITAL_BASED_OUTPATIENT_CLINIC_OR_DEPARTMENT_OTHER): Payer: BC Managed Care – PPO | Attending: Cardiology | Admitting: Cardiology

## 2019-09-01 ENCOUNTER — Other Ambulatory Visit: Payer: Self-pay

## 2019-09-01 DIAGNOSIS — G4733 Obstructive sleep apnea (adult) (pediatric): Secondary | ICD-10-CM | POA: Diagnosis not present

## 2019-09-02 ENCOUNTER — Telehealth: Payer: Self-pay | Admitting: *Deleted

## 2019-09-02 NOTE — Telephone Encounter (Signed)
Informed patient of sleep study results and patient understanding was verbalized. Patient understands his sleep study showed they had a successful PAP titration and let DME know that orders are in EPIC. Please set up 8 week OV with me.   Upon patient request DME selection is  Patient understands he will be contacted by [facility] to set up his cpap. Patient understands to call if [facility] does not contact him with new setup in a timely manner. Patient understands they will be called once confirmation has been received from [facility] that they have received their new machine to schedule 10 week follow up appointment.  [facility] notified of new cpap order  Please add to airview Patient was grateful for the call and thanked me.

## 2019-09-02 NOTE — Procedures (Signed)
    Patient Name: Sean Sharp, Sean Sharp Date: 09/01/2019 Gender: Male D.O.B: 06-Jun-1976 Age (years): 46 Referring Provider: Fransico Him MD, ABSM Height (inches): 74 Interpreting Physician: Fransico Him MD, ABSM Weight (lbs): 281 RPSGT: Zadie Rhine BMI: 36 MRN: [id number] Neck Size: 18.00  CLINICAL INFORMATION The patient is referred for a CPAP titration to treat sleep apnea.  SLEEP STUDY TECHNIQUE As per the AASM Manual for the Scoring of Sleep and Associated Events v2.3 (April 2016) with a hypopnea requiring 4% desaturations.  The channels recorded and monitored were frontal, central and occipital EEG, electrooculogram (EOG), submentalis EMG (chin), nasal and oral airflow, thoracic and abdominal wall motion, anterior tibialis EMG, snore microphone, electrocardiogram, and pulse oximetry. Bilevel positive airway pressure (BPAP) was initiated at the beginning of the study and titrated to treat sleep-disordered breathing.  MEDICATIONS Medications self-administered by patient taken the night of the study : N/A  RESPIRATORY PARAMETERS Optimal CPAP Pressure (cm):18  AHI at Optimal Pressure (/hr) 0.0 Overall Minimal O2 (%):80.0  Minimal O2 at Optimal Pressure (%): 93.0  SLEEP ARCHITECTURE Start Time:9:41:45 PM  Stop Time:3:53:27 AM  Total Time (min):371.7  otal Sleep Time (min): 363.4 Sleep Latency (min):0.0  Sleep Efficiency (%):97.8%  EM Latency (min):60.9  WASO (min):8.3 Stage N1 (%):1.4%  Stage N2 (%): 66.8%  Stage N3 (%): 0.0%  Stage R (%): 31.8 Supine (%):45.10  Arousal Index (/hr):6.8   CARDIAC DATA The 2 lead EKG demonstrated sinus rhythm. The mean heart rate was 68.2 beats per minute. Other EKG findings include: None.  LEG MOVEMENT DATA The total Periodic Limb Movements of Sleep (PLMS) were 0. The PLMS index was 0.0. A PLMS index of <15 is considered normal in adults.  IMPRESSIONS - An optimal PAP pressure was selected for this patient ( 18 cm of water) -  Central sleep apnea was not noted during this titration (CAI = 0.2/h). - Severe oxygen desaturations were observed during this titration (min O2 = 80.0%). - No snoring was audible during this study. - No cardiac abnormalities were observed during this study. - Clinically significant periodic limb movements were not noted during this study. Arousals associated with PLMs were rare.  DIAGNOSIS - Obstructive Sleep Apnea (327.23 [G47.33 ICD-10])   RECOMMENDATIONS - Trial of CPAP therapy on 18 cm H2O with a Medium size Resmed Full Face Mask AirFit F20 mask and heated humidification. - Avoid alcohol, sedatives and other CNS depressants that may worsen sleep apnea and disrupt normal sleep architecture. - Sleep hygiene should be reviewed to assess factors that may improve sleep quality. - Weight management and regular exercise should be initiated or continued. - Return to Sleep Center for re-evaluation after 8 weeks of therapy  [Electronically signed] 09/02/2019 02:23 PM  Fransico Him MD, ABSM Diplomate, American Board of Sleep Medicine

## 2019-09-02 NOTE — Telephone Encounter (Signed)
-----  Message from Sueanne Margarita, MD sent at 09/02/2019  2:26 PM EDT ----- Please let patient know that they had a successful PAP titration and let DME know that orders are in EPIC.  Please set up 8 week OV with me.

## 2019-09-06 NOTE — Telephone Encounter (Signed)
Patient has not been using his machine at all per air view since receiving it. He complains of the setting not being right for him. I have contacted [facility] to try to fix his problem. He does not want a new machine just want settings changed. Patient states he will call [employer].

## 2019-09-15 ENCOUNTER — Other Ambulatory Visit: Payer: Self-pay

## 2019-09-15 NOTE — Telephone Encounter (Signed)
Tiffany at [facility] called stating patient called in to say he does not need any supplies or anything else at this time.

## 2019-09-16 ENCOUNTER — Ambulatory Visit: Payer: BC Managed Care – PPO | Admitting: Family Medicine

## 2019-09-16 ENCOUNTER — Encounter: Payer: Self-pay | Admitting: Family Medicine

## 2019-09-16 VITALS — BP 133/83 | HR 79 | Temp 98.1°F | Resp 16 | Ht 74.0 in | Wt 288.0 lb

## 2019-09-16 DIAGNOSIS — Z9989 Dependence on other enabling machines and devices: Secondary | ICD-10-CM | POA: Diagnosis not present

## 2019-09-16 DIAGNOSIS — D869 Sarcoidosis, unspecified: Secondary | ICD-10-CM | POA: Diagnosis not present

## 2019-09-16 DIAGNOSIS — I10 Essential (primary) hypertension: Secondary | ICD-10-CM | POA: Diagnosis not present

## 2019-09-16 DIAGNOSIS — G4733 Obstructive sleep apnea (adult) (pediatric): Secondary | ICD-10-CM | POA: Diagnosis not present

## 2019-09-16 LAB — BASIC METABOLIC PANEL
BUN: 15 mg/dL (ref 6–23)
CO2: 27 mEq/L (ref 19–32)
Calcium: 9.1 mg/dL (ref 8.4–10.5)
Chloride: 103 mEq/L (ref 96–112)
Creatinine, Ser: 1.17 mg/dL (ref 0.40–1.50)
GFR: 81.91 mL/min (ref >60.00)
Glucose, Bld: 95 mg/dL (ref 70–99)
Potassium: 4.2 mEq/L (ref 3.5–5.1)
Sodium: 139 mEq/L (ref 135–145)

## 2019-09-16 NOTE — Progress Notes (Signed)
OFFICE VISIT  09/16/2019   CC:  Chief Complaint  Patient presents with  . Follow-up    HTN, pt is fasting   HPI:    Patient is a 44 y.o. African-American male who presents for 1 mo f/u HTN. 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 Resolving appropriately, essentially back to baseline state of health. Continue on plaquenil and prednisone, med mgmt for this via his derm, rheum, and pulm providers. He's utd on pneumonvax 23.  2) HTN: recent AKI on CRI 2 while ill recently: BMET good last visit. Restart spironolactone at 76m qd dose.  3) Prediabetes: A1c improved to 5.8% today. TLC habits emphasized."  Interim hx:  No home monitoring of bp. Feeling fine. Just got started on CPAP. Stamina with going up and down stairs is stable, not too bad. Still on prednisone taper.  Job requires him to be walking or standing all the time, says knees are feeling pain/strain constantly.   Past Medical History:  Diagnosis Date  . Allergic rhinitis   . 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  Hemoglobin electrophoresis normal 2016.  .Marland KitchenChronic renal insufficiency, stage II (mild)   . Cutaneous sarcoidosis    [place] rheum: as of 02/2019->plaquenil.  Cont plaquenil and slowly tapering prednisone as of 06/2019 rheum f/u  . Hoarseness 2015   [facility]: prednisone taper and bid nexium recommended.  No signif help [facility]   . HTN (hypertension)   . Hydrocele    left  . LPRD (laryngopharyngeal reflux disease)   . Obesity, Class II, BMI 35-39.9   . Olecranon bursitis of right elbow 04/29/2018   persistent/recurrent s/p aspiration-->bursa excision   . OSA (obstructive sleep apnea) 07/28/2019   SEVERE OSA, mild central sleep apnea, CPAP recommended (Dr. TFransico Him  . Peripheral edema    left leg>right leg  . Prediabetes 02/2017   A1c 6.3% (right when he was starting to take prednisone prn for treatment of  his sarcoidosis).  HbA1c 08/2017=6.3%.  . Pulmonary arterial hypertension (HCC)    Mild (R heart cath 06/27/19), echo 06/2019 normal.  Wt loss and CPAP recommended.  . Sarcoidosis of lung ([facility] [place]   2018, but Dr. WMelvyn Novassuspects it was smoldering since 2011.  Skin bx of cutaneous lesion confirmed dx of sarcoidosis 02/2017.  Pt improving fall 2018 with plaquenil and prednisone.  Pt self d/c'd plaquenil.  . Vocal cord polyps 2015   surgery [facility]     Past Surgical History:  Procedure Laterality Date  . OLECRANON BURSECTOMY Right 2019  . Polysomnogram  07/28/2019   Severe OSA w/hypoxia, mild central sleep apnea, CPAP recommended (Dr. TFransico Him  . RIGHT HEART CATH N/A 06/27/2019   Mild PAH. Procedure: RIGHT HEART CATH;  Surgeon: BJolaine Artist MD;  [facility];  Service: Cardiovascular;  Laterality: N/A;  . 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  . SKIN GRAFT  2010   on left index finger  . TRANSTHORACIC ECHOCARDIOGRAM  06/27/2019   NORMAL  . [facility]  . [facility]  [facility]    Outpatient Medications Prior to Visit  Medication Sig Dispense Refill  . amLODipine (NORVASC) 10 MG tablet Take 10 mg by mouth daily.    . Cetirizine HCl (ZYRTEC PO) Take 10 mg by mouth daily.    .Marland Kitchen  hydroxychloroquine (PLAQUENIL) 200 MG tablet Take 400 mg by mouth daily.    . metoprolol succinate (TOPROL-XL) 25 MG 24 hr tablet Take 1.5 tablets (37.5 mg total) by mouth daily. 45 tablet 0  . pantoprazole (PROTONIX) 40 MG tablet 1 tab po bid 180 tablet 1  . predniSONE (DELTASONE) 5 MG tablet Take 15 mg by mouth daily.    Marland Kitchen spironolactone (ALDACTONE) 25 MG tablet Take 1 tablet (25 mg total) by mouth daily. 30 tablet 1  . Clocortolone Pivalate (CLODERM) 0.1 % cream Apply 1 application topically 2 (two) times daily as needed (Sarcoidosis/rash).     . hydrocortisone butyrate (LUCOID) 0.1 % CREA cream Apply 1 application topically  2 (two) times daily. (Patient not taking: Reported on 09/16/2019) 60 g 6  . traMADol (ULTRAM) 50 MG tablet Take 1 tablet (50 mg total) by mouth every 6 (six) hours as needed for moderate pain. (Patient not taking: Reported on 09/16/2019) 20 tablet 0  . traZODone (DESYREL) 50 MG tablet 1-2 tabs po qhs prn insomnia (Patient not taking: Reported on 09/16/2019) 30 tablet 1   No facility-administered medications prior to visit.    Allergies  Allergen Reactions  . Bee Venom Swelling    ROS As per HPI  PE: Blood pressure 133/83, pulse 79, temperature 98.1 F (36.7 C), temperature source Temporal, resp. rate 16, height _0  (1.88 m), weight 288 lb (130.6 kg), SpO2 97 %. Body mass index is 36.98 kg/m.  Gen: Alert, well appearing.  Patient is oriented to person, place, time, and situation. AFFECT: pleasant, lucid thought and speech. CV: RRR, no m/r/g.  Split S2 at pulmonic valve area, heard best on inspiration but subtly present and exp as well. LUNGS: CTA bilat, nonlabored resps, good aeration in all lung fields. EXT: no clubbing or cyanosis.  2+ bilat LL pitting edema.    LABS:    Chemistry      Component Value Date/Time   NA 138 08/01/2019 0945   K 4.5 08/01/2019 0945   CL 102 08/01/2019 0945   CO2 28 08/01/2019 0945   BUN 14 08/01/2019 0945   CREATININE 1.06 08/01/2019 0945   CREATININE 1.43 (H) 02/28/2016 0720      Component Value Date/Time   CALCIUM 9.3 08/01/2019 0945   ALKPHOS 80 08/01/2019 0945   AST 20 08/01/2019 0945   ALT 25 08/01/2019 0945   BILITOT 0.5 08/01/2019 0945     Lab Results  Component Value Date   WBC 9.2 08/01/2019   HGB 12.3 (L) 08/01/2019   HCT 39.3 08/01/2019   MCV 69.7 Repeated and verified X2. (L) 08/01/2019   PLT 319.0 08/01/2019   Lab Results  Component Value Date   HGBA1C 5.8 (A) 08/15/2019   HGBA1C 5.8 08/15/2019   HGBA1C 5.8 08/15/2019   HGBA1C 5.8 08/15/2019   IMPRESSION AND PLAN:  1) HTN: good today.  Needs to keep any eye on this  at home.  He plans on getting new cuff soon. 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 BMET today since he got on spironolactone since our last visit.  2) OSA: he is on CPAP now and hopefully this will limit any progression of pulm htn.  3) Sarcoidosis: stable, continuing gradual prednisone ween a this time. Pulm following.  An After Visit Summary was printed and given to the patient.  FOLLOW UP: Return for 4-6 mo CPE.  Signed:  Crissie Sickles, MD  09/16/2019

## 2019-09-21 ENCOUNTER — Telehealth: Payer: Self-pay | Admitting: Internal Medicine

## 2019-09-21 NOTE — Telephone Encounter (Signed)
Spoke with Caryl Pina, I advised her that we did not refer pt to their clinic and that she would need a release of medical information before I can send her the notes. Caryl Pina understood and nothing further is needed.

## 2019-10-12 DIAGNOSIS — R0602 Shortness of breath: Secondary | ICD-10-CM | POA: Diagnosis not present

## 2019-10-12 DIAGNOSIS — J310 Chronic rhinitis: Secondary | ICD-10-CM | POA: Diagnosis not present

## 2019-10-12 DIAGNOSIS — D869 Sarcoidosis, unspecified: Secondary | ICD-10-CM | POA: Diagnosis not present

## 2019-10-12 DIAGNOSIS — I2721 Secondary pulmonary arterial hypertension: Secondary | ICD-10-CM | POA: Diagnosis not present

## 2019-10-14 LAB — BASIC METABOLIC PANEL
BUN: 14 (ref 4–21)
CO2: 21 (ref 13–22)
Chloride: 103 (ref 99–108)
Creatinine: 1.1 (ref 0.6–1.3)
Glucose: 103
Potassium: 4.6 (ref 3.4–5.3)
Sodium: 139 (ref 137–147)

## 2019-10-14 LAB — HEPATIC FUNCTION PANEL
ALT: 19 (ref 10–40)
AST: 21 (ref 14–40)
Alkaline Phosphatase: 64 (ref 25–125)
Bilirubin, Total: 0.4

## 2019-10-14 LAB — CBC AND DIFFERENTIAL
HCT: 47 (ref 41–53)
Hemoglobin: 14.6 (ref 13.5–17.5)
Neutrophils Absolute: 7
Platelets: 252 (ref 150–399)
WBC: 8.4

## 2019-10-14 LAB — COMPREHENSIVE METABOLIC PANEL
Albumin: 4.2 (ref 3.5–5.0)
Calcium: 9.7 (ref 8.7–10.7)
GFR calc Af Amer: 91
GFR calc non Af Amer: 79
Globulin: 3

## 2019-10-14 LAB — CBC: RBC: 6.68 — AB (ref 3.87–5.11)

## 2019-10-17 ENCOUNTER — Other Ambulatory Visit: Payer: Self-pay

## 2019-10-17 MED ORDER — SPIRONOLACTONE 25 MG PO TABS: 25.0000 mg | ORAL_TABLET | Freq: Every day | ORAL | 0 refills | Status: DC

## 2019-10-20 ENCOUNTER — Encounter: Payer: Self-pay | Admitting: Family Medicine

## 2019-10-25 ENCOUNTER — Encounter: Payer: Self-pay | Admitting: Family Medicine

## 2020-01-15 ENCOUNTER — Other Ambulatory Visit: Payer: Self-pay | Admitting: Family Medicine

## 2020-01-15 DIAGNOSIS — N179 Acute kidney failure, unspecified: Secondary | ICD-10-CM

## 2020-01-16 NOTE — Telephone Encounter (Signed)
Rx filled and VM left for pt notification.

## 2020-02-03 ENCOUNTER — Other Ambulatory Visit: Payer: Self-pay

## 2020-02-03 ENCOUNTER — Encounter: Payer: Self-pay | Admitting: Family Medicine

## 2020-02-03 ENCOUNTER — Ambulatory Visit (INDEPENDENT_AMBULATORY_CARE_PROVIDER_SITE_OTHER): Payer: BC Managed Care – PPO | Admitting: Family Medicine

## 2020-02-03 VITALS — BP 146/90 | HR 79 | Temp 98.1°F | Resp 16 | Ht 73.0 in | Wt 300.0 lb

## 2020-02-03 DIAGNOSIS — I10 Essential (primary) hypertension: Secondary | ICD-10-CM | POA: Diagnosis not present

## 2020-02-03 DIAGNOSIS — D869 Sarcoidosis, unspecified: Secondary | ICD-10-CM | POA: Diagnosis not present

## 2020-02-03 DIAGNOSIS — Z0000 Encounter for general adult medical examination without abnormal findings: Secondary | ICD-10-CM

## 2020-02-03 DIAGNOSIS — R7303 Prediabetes: Secondary | ICD-10-CM | POA: Diagnosis not present

## 2020-02-03 LAB — TSH: TSH: 1.49 u[IU]/mL (ref 0.35–4.50)

## 2020-02-03 LAB — COMPREHENSIVE METABOLIC PANEL
ALT: 17 U/L (ref 0–53)
AST: 19 U/L (ref 0–37)
Albumin: 4.2 g/dL (ref 3.5–5.2)
Alkaline Phosphatase: 55 U/L (ref 39–117)
BUN: 17 mg/dL (ref 6–23)
CO2: 24 mEq/L (ref 19–32)
Calcium: 9.5 mg/dL (ref 8.4–10.5)
Chloride: 101 mEq/L (ref 96–112)
Creatinine, Ser: 1.3 mg/dL (ref 0.40–1.50)
GFR: 72.4 mL/min (ref >60.00)
Glucose, Bld: 96 mg/dL (ref 70–99)
Potassium: 4.6 mEq/L (ref 3.5–5.1)
Sodium: 136 mEq/L (ref 135–145)
Total Bilirubin: 0.8 mg/dL (ref 0.2–1.2)
Total Protein: 7.4 g/dL (ref 6.0–8.3)

## 2020-02-03 LAB — CBC WITH DIFFERENTIAL/PLATELET
Basophils Absolute: 0 10*3/uL (ref 0.0–0.1)
Basophils Relative: 0.6 % (ref 0.0–3.0)
Eosinophils Absolute: 0.1 10*3/uL (ref 0.0–0.7)
Eosinophils Relative: 1.3 % (ref 0.0–5.0)
HCT: 46.5 % (ref 39.0–52.0)
Hemoglobin: 14.7 g/dL (ref 13.0–17.0)
Lymphocytes Relative: 5.3 % — ABNORMAL LOW (ref 12.0–46.0)
Lymphs Abs: 0.4 10*3/uL — ABNORMAL LOW (ref 0.7–4.0)
MCHC: 31.5 g/dL (ref 30.0–36.0)
MCV: 70.3 fl — ABNORMAL LOW (ref 78.0–100.0)
Monocytes Absolute: 0.6 10*3/uL (ref 0.1–1.0)
Monocytes Relative: 8.9 % (ref 3.0–12.0)
Neutro Abs: 5.7 10*3/uL (ref 1.4–7.7)
Neutrophils Relative %: 83.9 % — ABNORMAL HIGH (ref 43.0–77.0)
Platelets: 215 10*3/uL (ref 150.0–400.0)
RBC: 6.62 Mil/uL — ABNORMAL HIGH (ref 4.22–5.81)
RDW: 17.8 % — ABNORMAL HIGH (ref 11.5–15.5)
WBC: 6.8 10*3/uL (ref 4.0–10.5)

## 2020-02-03 LAB — LIPID PANEL
Cholesterol: 186 mg/dL (ref 0–200)
HDL: 35.1 mg/dL — ABNORMAL LOW (ref >39.00)
LDL Cholesterol: 136 mg/dL — ABNORMAL HIGH (ref 0–99)
NonHDL: 151.32
Total CHOL/HDL Ratio: 5
Triglycerides: 79 mg/dL (ref 0.0–149.0)
VLDL: 15.8 mg/dL (ref 0.0–40.0)

## 2020-02-03 LAB — HEMOGLOBIN A1C: Hgb A1c MFr Bld: 6.4 % (ref 4.6–6.5)

## 2020-02-03 MED ORDER — METOPROLOL SUCCINATE ER 25 MG PO TB24: 37.5000 mg | ORAL_TABLET | Freq: Every day | ORAL | 3 refills | Status: DC

## 2020-02-03 MED ORDER — AMLODIPINE BESYLATE 10 MG PO TABS
10.0000 mg | ORAL_TABLET | Freq: Every day | ORAL | 3 refills | Status: DC
Start: 2020-02-03 — End: 2021-01-10

## 2020-02-03 NOTE — Patient Instructions (Signed)
Health Maintenance, Male Adopting a healthy lifestyle and getting preventive care are important in promoting health and wellness. Ask your health care provider about:  The right schedule for you to have regular tests and exams.  Things you can do on your own to prevent diseases and keep yourself healthy. What should I know about diet, weight, and exercise? Eat a healthy diet   Eat a diet that includes plenty of vegetables, fruits, low-fat dairy products, and lean protein.  Do not eat a lot of foods that are high in solid fats, added sugars, or sodium. Maintain a healthy weight Body mass index (BMI) is a measurement that can be used to identify possible weight problems. It estimates body fat based on height and weight. Your health care provider can help determine your BMI and help you achieve or maintain a healthy weight. Get regular exercise Get regular exercise. This is one of the most important things you can do for your health. Most adults should:  Exercise for at least 150 minutes each week. The exercise should increase your heart rate and make you sweat (moderate-intensity exercise).  Do strengthening exercises at least twice a week. This is in addition to the moderate-intensity exercise.  Spend less time sitting. Even light physical activity can be beneficial. Watch cholesterol and blood lipids Have your blood tested for lipids and cholesterol at 44 years of age, then have this test every 5 years. You may need to have your cholesterol levels checked more often if:  Your lipid or cholesterol levels are high.  You are older than 44 years of age.  You are at high risk for heart disease. What should I know about cancer screening? Many types of cancers can be detected early and may often be prevented. Depending on your health history and family history, you may need to have cancer screening at various ages. This may include screening for:  Colorectal cancer.  Prostate  cancer.  Skin cancer.  Lung cancer. What should I know about heart disease, diabetes, and high blood pressure? Blood pressure and heart disease  High blood pressure causes heart disease and increases the risk of stroke. This is more likely to develop in people who have high blood pressure readings, are of African descent, or are overweight.  Talk with your health care provider about your target blood pressure readings.  Have your blood pressure checked: ? Every 3-5 years if you are 8-22 years of age. ? Every year if you are 42 years old or older.  If you are between the ages of 10 and 18 and are a current or former smoker, ask your health care provider if you should have a one-time screening for abdominal aortic aneurysm (AAA). Diabetes Have regular diabetes screenings. This checks your fasting blood sugar level. Have the screening done:  Once every three years after age 21 if you are at a normal weight and have a low risk for diabetes.  More often and at a younger age if you are overweight or have a high risk for diabetes. What should I know about preventing infection? Hepatitis B If you have a higher risk for hepatitis B, you should be screened for this virus. Talk with your health care provider to find out if you are at risk for hepatitis B infection. Hepatitis C Blood testing is recommended for:  Everyone born from [birth year] through 1965.  Anyone with known risk factors for hepatitis C. Sexually transmitted infections (STIs)  You should be screened each year  for STIs, including gonorrhea and chlamydia, if: ? You are sexually active and are younger than 44 years of age. ? You are older than 44 years of age and your health care provider tells you that you are at risk for this type of infection. ? Your sexual activity has changed since you were last screened, and you are at increased risk for chlamydia or gonorrhea. Ask your health care provider if you are at risk.  Ask your  health care provider about whether you are at high risk for HIV. Your health care provider may recommend a prescription medicine to help prevent HIV infection. If you choose to take medicine to prevent HIV, you should first get tested for HIV. You should then be tested every 3 months for as long as you are taking the medicine. Follow these instructions at home: Lifestyle  Do not use any products that contain nicotine or tobacco, such as cigarettes, e-cigarettes, and chewing tobacco. If you need help quitting, ask your health care provider.  Do not use street drugs.  Do not share needles.  Ask your health care provider for help if you need support or information about quitting drugs. Alcohol use  Do not drink alcohol if your health care provider tells you not to drink.  If you drink alcohol: ? Limit how much you have to 0-2 drinks a day. ? Be aware of how much alcohol is in your drink. In the [place] one drink equals one 12 oz bottle of beer (355 mL), one 5 oz glass of wine (148 mL), or one 1 oz glass of hard liquor (44 mL). General instructions  Schedule regular health, dental, and eye exams.  Stay current with your vaccines.  Tell your health care provider if: ? You often feel depressed. ? You have ever been abused or do not feel safe at home. Summary  Adopting a healthy lifestyle and getting preventive care are important in promoting health and wellness.  Follow your health care provider's instructions about healthy diet, exercising, and getting tested or screened for diseases.  Follow your health care provider's instructions on monitoring your cholesterol and blood pressure. This information is not intended to replace advice given to you by your health care provider. Make sure you discuss any questions you have with your health care provider. Document Revised: 05/19/2018 Document Reviewed: 05/19/2018 Elsevier Patient Education  2020 [employer].

## 2020-02-03 NOTE — Progress Notes (Signed)
Office Note 02/03/2020  CC:  Chief Complaint  Patient presents with  . Annual Exam    pt is fasting    HPI:  Sean Sharp is a 44 y.o. Black male who is here for annual health maintenance exam and f/u HTN and prediabetes. He has cutaneous and pulm sarcoidosis and is followed by pulm and derm.  A/P as of last visit: "1) HTN: good today.  Needs to keep any eye on this at home.  He plans on getting new cuff soon. 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 BMET today since he got on spironolactone since our last visit.  2) OSA: he is on CPAP now and hopefully this will limit any progression of pulm htn.  3) Sarcoidosis: stable, continuing gradual prednisone ween a this time. Pulm following."  INTERIM HX: Doing fine. Taking spironolactone but ran out of metop last week.  Has amlodipine but not taking it. No home bp monitoring.  OSA: probs with being compliant with this, settings issues. He will be making a call to work on this issue.  Seeing rheum, still weening down on prednisone. His energy level is getting better gradually, breathing feeling stable, chronic cough fairly responsive to otc zyrtec qd.  Admits to not eating right except the last 2 wks.   Past Medical History:  Diagnosis Date  . Allergic rhinitis   . 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  Hemoglobin electrophoresis normal 2016.  Marland Kitchen Chronic renal insufficiency, stage II (mild)   . Cutaneous sarcoidosis    [place] rheum: as of 02/2019->plaquenil.  Cont plaquenil and slowly tapering prednisone as of 06/2019 rheum f/u  . Hoarseness 2015   [facility] 04/2014-normal laryngoscopy: prednisone taper and bid nexium recommended.  No signif help so pt got 2nd opinion at [facility]   . HTN (hypertension)   . Hydrocele    left  . LPRD (laryngopharyngeal reflux disease)   . Obesity, Class II, BMI 35-39.9   . Olecranon bursitis of right elbow 04/29/2018   persistent/recurrent s/p aspiration-->bursa  excision   . OSA (obstructive sleep apnea) 07/28/2019   SEVERE OSA, mild central sleep apnea, CPAP trial 08/2019 (Dr. Fransico Him)  . Peripheral edema    left leg>right leg  . Prediabetes 02/2017   A1c 6.3% (right when he was starting to take prednisone prn for treatment of his sarcoidosis).  HbA1c 08/2017=6.3%.  . Pulmonary arterial hypertension (HCC)    Mild (R heart cath 06/27/19), echo 06/2019 normal.  Wt loss and CPAP recommended.  . Sarcoidosis of lung ([place]) 12/2016   2018, but Dr. Melvyn Novas suspects it was smoldering since 2011.  Skin bx of cutaneous lesion confirmed dx of sarcoidosis 02/2017.  Pt improving fall 2018 with plaquenil and prednisone.  Pt self d/c'd plaquenil.  . Vocal cord polyps 2015   surgery [facility]     Past Surgical History:  Procedure Laterality Date  . OLECRANON BURSECTOMY Right 2019  . Polysomnogram  07/28/2019   Severe OSA w/hypoxia, mild central sleep apnea, CPAP recommended (Dr. Fransico Him)  . RIGHT HEART CATH N/A 06/27/2019   Mild PAH. Procedure: RIGHT HEART CATH;  Surgeon: Jolaine Artist, MD;  Location: [facility];  Service: Cardiovascular;  Laterality: N/A;  . 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  . SKIN GRAFT  2010   on left index finger  . TRANSTHORACIC ECHOCARDIOGRAM  06/27/2019   NORMAL  . Vocal cord  surgery  2016   [facility]  . WISDOM TOOTH EXTRACTION  2005    Family History  Problem Relation Age of Onset  . Sarcoidosis Father        ?  Marland Kitchen Lactose intolerance Father   . Other Mother        colon issues-portion of colon removed  . Lactose intolerance Mother   . Cancer Paternal Grandfather        ?    Social History   Socioeconomic History  . Marital status: Married    Spouse name: Not on file  . Number of children: 2  . Years of education: Not on file  . Highest education level: Not on file  Occupational History  . Occupation: Clinical biochemist  Tobacco Use  . Smoking status: Former Smoker     Packs/day: 0.30    Years: 1.50    Pack years: 0.45    Types: Cigarettes    Quit date: 06/10/1995    Years since quitting: 24.6  . Smokeless tobacco: Never Used  Substance and Sexual Activity  . Alcohol use: Yes    Comment: occasional  . Drug use: No  . Sexual activity: Not on file  Other Topics Concern  . Not on file  Social History Narrative   Married, 2 children (15 and 10 y/o).   Occupation: Clinical biochemist Editor, commissioning).   Orig from [facility].   No Tobacco.  Rare alcohol.  No drugs.   Enjoys fishing, Environmental health practitioner, wood working, Research officer, trade union.      Social Determinants of Health   Financial Resource Strain:   . Difficulty of Paying Living Expenses: Not on file  Food Insecurity:   . Worried About Charity fundraiser in the Last Year: Not on file  . Ran Out of Food in the Last Year: Not on file  Transportation Needs:   . Lack of Transportation (Medical): Not on file  . Lack of Transportation (Non-Medical): Not on file  Physical Activity:   . Days of Exercise per Week: Not on file  . Minutes of Exercise per Session: Not on file  Stress:   . Feeling of Stress : Not on file  Social Connections:   . Frequency of Communication with Friends and Family: Not on file  . Frequency of Social Gatherings with Friends and Family: Not on file  . Attends Religious Services: Not on file  . Active Member of Clubs or Organizations: Not on file  . Attends Archivist Meetings: Not on file  . Marital Status: Not on file  Intimate Partner Violence:   . Fear of Current or Ex-Partner: Not on file  . Emotionally Abused: Not on file  . Physically Abused: Not on file  . Sexually Abused: Not on file    Outpatient Medications Prior to Visit  Medication Sig Dispense Refill  . Cetirizine HCl (ZYRTEC PO) Take 10 mg by mouth daily.    . hydroxychloroquine (PLAQUENIL) 200 MG tablet Take 400 mg by mouth daily.    . pantoprazole (PROTONIX) 40 MG tablet TAKE 1 TABLET BY MOUTH 30 TO  60 MINUTES BEFORE YOUR FIRST AND LAST MEALS OF THE DAY 180 tablet 1  . predniSONE (DELTASONE) 5 MG tablet Take 10 mg by mouth daily.     Marland Kitchen spironolactone (ALDACTONE) 25 MG tablet TAKE 1 TABLET(25 MG) BY MOUTH DAILY 90 tablet 0  . metoprolol succinate (TOPROL-XL) 25 MG 24 hr tablet Take 1.5 tablets (37.5 mg total) by mouth daily. [place]  tablet 0  . Clocortolone Pivalate (CLODERM) 0.1 % cream Apply 1 application topically 2 (two) times daily as needed (Sarcoidosis/rash).  (Patient not taking: Reported on 02/03/2020)    . hydrocortisone butyrate (LUCOID) 0.1 % CREA cream Apply 1 application topically 2 (two) times daily. (Patient not taking: Reported on 09/16/2019) 60 g 6  . traZODone (DESYREL) 50 MG tablet 1-2 tabs po qhs prn insomnia (Patient not taking: Reported on 09/16/2019) 30 tablet 1  . amLODipine (NORVASC) 10 MG tablet Take 10 mg by mouth daily. (Patient not taking: Reported on 02/03/2020)    . traMADol (ULTRAM) 50 MG tablet Take 1 tablet (50 mg total) by mouth every 6 (six) hours as needed for moderate pain. (Patient not taking: Reported on 09/16/2019) 20 tablet 0   No facility-administered medications prior to visit.    Allergies  Allergen Reactions  . Wasp Venom Swelling    ROS Review of Systems  Constitutional: Negative for appetite change, chills, fatigue and fever.  HENT: Positive for postnasal drip (chronic) and sinus pressure (chronic). Negative for congestion (chro nas cong), dental problem, ear pain and sore throat.   Eyes: Negative for discharge, redness and visual disturbance.  Respiratory: Positive for cough. Negative for chest tightness, shortness of breath and wheezing.   Cardiovascular: Negative for chest pain, palpitations and leg swelling.  Gastrointestinal: Negative for abdominal pain, blood in stool, diarrhea, nausea and vomiting.  Genitourinary: Negative for difficulty urinating, dysuria, flank pain, frequency, hematuria and urgency.  Musculoskeletal: Negative for  arthralgias, back pain, joint swelling, myalgias and neck stiffness.  Skin: Negative for pallor and rash.  Neurological: Negative for dizziness, speech difficulty, weakness and headaches.  Hematological: Negative for adenopathy. Does not bruise/bleed easily.  Psychiatric/Behavioral: Negative for confusion and sleep disturbance. The patient is not nervous/anxious.     PE; Vitals with BMI 02/03/2020 09/16/2019 09/01/2019  Height _0  _1  _2   Weight 300 lbs 288 lbs 281 lbs  BMI 39.59 29.47 65.46  Systolic 503 546 -  Diastolic 90 83 -  Pulse 79 79 -  O2 sat 97% on RA today  Gen: Alert, well appearing.  Patient is oriented to person, place, time, and situation. AFFECT: pleasant, lucid thought and speech. ENT: Ears: EACs clear, normal epithelium.  TMs with good light reflex and landmarks bilaterally.  Eyes: no injection, icteris, swelling, or exudate.  EOMI, PERRLA. Nose: no drainage or turbinate edema/swelling.  No injection or focal lesion.  Mouth: lips without lesion/swelling.  Oral mucosa pink and moist.  Dentition intact and without obvious caries or gingival swelling.  Oropharynx without erythema, exudate, or swelling.  Neck: supple/nontender.  No LAD, mass, or TM.  Carotid pulses 2+ bilaterally, without bruits. CV: RRR, no m/r/g.   LUNGS: CTA bilat, nonlabored resps, good aeration in all lung fields. ABD: soft, NT, ND, BS normal.  No hepatospenomegaly or mass.  No bruits. EXT: no clubbing, cyanosis, or edema.  Musculoskeletal: no joint swelling, erythema, warmth, or tenderness.  ROM of all joints intact. Skin - no sores or suspicious lesions or rashes or color changes   Pertinent labs:  Lab Results  Component Value Date   TSH 2.75 04/20/2018   Lab Results  Component Value Date   WBC 8.4 10/14/2019   HGB 14.6 10/14/2019   HCT 47 10/14/2019   MCV 69.7 Repeated and verified X2. (L) 08/01/2019   PLT 252 10/14/2019   Lab Results  Component Value Date   CREATININE 1.1  10/14/2019   BUN 14  10/14/2019   NA 139 10/14/2019   K 4.6 10/14/2019   CL 103 10/14/2019   CO2 21 10/14/2019   Lab Results  Component Value Date   ALT 19 10/14/2019   AST 21 10/14/2019   ALKPHOS 64 10/14/2019   BILITOT 0.5 08/01/2019   Lab Results  Component Value Date   CHOL 191 04/20/2018   Lab Results  Component Value Date   HDL 40.70 04/20/2018   Lab Results  Component Value Date   LDLCALC 131 (H) 04/20/2018   Lab Results  Component Value Date   TRIG 258 (H) 07/16/2019   Lab Results  Component Value Date   CHOLHDL 5 04/20/2018   Lab Results  Component Value Date   PSA 0.79 08/25/2014   Lab Results  Component Value Date   HGBA1C 5.8 (A) 08/15/2019   HGBA1C 5.8 08/15/2019   HGBA1C 5.8 08/15/2019   HGBA1C 5.8 08/15/2019   ASSESSMENT AND PLAN:   1) HTN: partial med noncompliance. Get back on amlodipine 61m qd, restart toprol xl 37.564mqd that he ran out of recently.  Continue aldactone 2546md. Lytes/cr today.  2) Prediabetes: unfortunately his diet is usually not good, not doing any formal exercise, and he is still on low dose prednisone chronically.  BMET and Hba1c today.  3) Health maintenance exam: Reviewed age and gender appropriate health maintenance issues (prudent diet, regular exercise, health risks of tobacco and excessive alcohol, use of seatbelts, fire alarms in home, use of sunscreen).  Also reviewed age and gender appropriate health screening as well as vaccine recommendations. Vaccines: Tdap and pneumovax UTD.  Covid 19 -->UTD.   Labs: fasting HP + A1c ordered. Prostate ca screening: average risk patient= as per latest guidelines, start screening at 50 87s of age. Colon ca screening: average risk patient= as per latest guidelines, start screening at 45 60s of age.  An After Visit Summary was printed and given to the patient.  FOLLOW UP:  Return in about 6 months (around 08/05/2020) for routine chronic illness f/u.  Signed:  PhiCrissie SicklesMD           02/03/2020

## 2020-02-05 ENCOUNTER — Encounter: Payer: Self-pay | Admitting: Family Medicine

## 2020-02-07 ENCOUNTER — Other Ambulatory Visit: Payer: Self-pay

## 2020-02-07 MED ORDER — METFORMIN HCL ER 500 MG PO TB24: 500.0000 mg | ORAL_TABLET | Freq: Every day | ORAL | 6 refills | Status: DC

## 2020-02-10 ENCOUNTER — Telehealth: Payer: Self-pay | Admitting: Internal Medicine

## 2020-02-10 NOTE — Telephone Encounter (Signed)
I attempted to call [facility] yesterday to speak to referral coordinator, office closed it was at [place], Attempted to call today - office closes at 1pm on Friday. I will try again Monday then contact pt -pr

## 2020-02-14 NOTE — Telephone Encounter (Signed)
Sure - me or [place] - first avail 30 min slot  Thanks  MR

## 2020-02-14 NOTE — Telephone Encounter (Signed)
Fine with me

## 2020-02-14 NOTE — Telephone Encounter (Signed)
Called and spoke with pt to get him scheduled for an appt. Pt has been scheduled for an appt with MR 9/14.nothing further needed.

## 2020-02-14 NOTE — Telephone Encounter (Signed)
Dr. Melvyn Novas, Dr. Vaughan Browner and Dr. Chase Caller we got a call from [facility] and they would like patient to either see Dr. Vaughan Browner or Dr. Chase Caller.  Dr. Melvyn Novas you ok with this switch? Dr. Vaughan Browner or Dr. Chase Caller are either of you willing to see this patient?

## 2020-02-14 NOTE — Telephone Encounter (Signed)
Called and spoke to referral coordinator at [place], Painter is wanting patient to be seen by Dr. Vaughan Browner or Dr. Chase Caller for possible ILD workup - advised will request the md change per our office protocol. I will call patient to advise -pr

## 2020-02-21 ENCOUNTER — Ambulatory Visit: Payer: BC Managed Care – PPO | Admitting: Internal Medicine

## 2020-02-21 ENCOUNTER — Encounter: Payer: Self-pay | Admitting: Internal Medicine

## 2020-02-21 ENCOUNTER — Other Ambulatory Visit: Payer: Self-pay

## 2020-02-21 VITALS — BP 140/96 | HR 87 | Temp 98.0°F | Ht 74.0 in | Wt 300.8 lb

## 2020-02-21 DIAGNOSIS — A481 Legionnaires' disease: Secondary | ICD-10-CM | POA: Diagnosis not present

## 2020-02-21 DIAGNOSIS — G4733 Obstructive sleep apnea (adult) (pediatric): Secondary | ICD-10-CM | POA: Diagnosis not present

## 2020-02-21 DIAGNOSIS — Z23 Encounter for immunization: Secondary | ICD-10-CM

## 2020-02-21 DIAGNOSIS — D869 Sarcoidosis, unspecified: Secondary | ICD-10-CM | POA: Diagnosis not present

## 2020-02-21 NOTE — Patient Instructions (Signed)
Sarcoidosis with skin and lung involvement   - glad you are better with prednisone daily since winter/spring 2021 but we need to restage  Plan  - for now continue prednisone 49m per day  - do HRCT supine and prone - next few to several weeks  - do full PFT - next few to several weeks  Flu vaccine need  - get flu shot 02/21/2020   OSA (obstructive sleep apnea)  - some of your fatigue and excess sleepiness could be tied to sleep apnea  Plan  - refer Dr TFransico Him Followup    - next few to several weeks (can be oct too) but after completing above - see Dr RBrantley Persons - 30 min slot preferred but if not 15 min ok

## 2020-02-21 NOTE — Progress Notes (Signed)
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 smoking   1997 s sequelae 2006  With exp to foam bad cough resolved w/in 3 days of avoidance and did fine until 2011 really bad cold > chronic cough eval by Dr Joya Gaskins 04/2010 with suspicion for sarcoid (pos in father) but CT with only nonspecific adenopathy  waxes and wanes since then with extensive w/u / rx at [facility] (reviewed in care everywhere)  And much  worse x 09/2016 assoc with hb/ worse with certain foods and some better dymista self referred to pulmonary clinic 12/11/2016    History of Present Illness  12/11/2016 1st [facility] office visit/ [facility]   Chief Complaint  [name], and has "passed out" once before due to cough.    cough x 6 years  Citric drinks make it worse  Allergy eval [place] [facility]edar allergy  nexium as walking the door     Kouffman Reflux v Neurogenic Cough Differentiator Reflux Comments  Do you awaken from a sound sleep coughing violently?                            With trouble breathing? No   Do you have choking episodes when you cannot  Get enough air, gasping for air ?              Yes   Do you usually cough when you lie down into  The bed, or when you just lie down to rest ?                          Sometimes depending on what eaten   Do you usually cough after meals or eating?         Yes   Do you cough when (or after) you bend over?    Yes if full   GERD SCORE     Kouffman Reflux v Neurogenic Cough Differentiator Neurogenic   Do you more-or-less cough all day long? Sporadically    Does change of temperature make you cough? Not much   Does laughing or chuckling cause you to cough? yes   Do fumes (perfume, automobile fumes, burned  Toast, etc.,)  cause you to cough ?      Can, esp cooking on grill   Does speaking, singing, or talking on the phone cause you to cough   ?               No    Neurogenic/Airway score      rec Protonix (pantoprazole) 40 mg  Take 30- 60 min before your first and last meals of the day  Continue dymista one twice daily - point toward ear on same side For drainage / throat tickle as need  >>>   take CHLORPHENIRAMINE  4 mg - take one every 4 hours as needed -  For cough >>>   tessilon 200 mg three to four times daily  GERD (REFLUX)   Please remember to go to the x-ray department downstairs in the basement  for your tests - we will call you with the results when they  are available. If not better at 6 weeks you need to return to [facility] - if better then ok to refill meds thru your PCP or return here at 3 months to regroup re need for longterm treatment options - late add:  ? pna in rll/ ? Sarcoid changes also rec:  Return for esr/ cmet/ angiotensin, cbc with diff and rx Augmentin 875 mg take one pill twice daily  X 10 daysAnd  Prednisone 10 mg take  4 each am x 2 days,   2 each am x 2 days,  1 each am x 2 days and stop  And f/u with cxr in 2 weeks =  No change 12/29/16  - Angiotensin  12/18/16   138 with esr 44  - CT 01/05/2017   Extensive partially calcified mediastinal and bilateral hilar lymphadenopathy with extensive perilymphatic nodularity throughout the lungs bilaterally (right greater than left). This spectrum of imaging findings is compatible with the clinically suspected sarcoidosis.       01/06/2017  f/u ov/[facility]ert re: probable sarcoid / ?uacs on gerd rx  Chief Complaint  Patient presents with  . Follow-up    Pt hwew to discuss CT scan, He still has occ. sob,he is still coughing which causes him some dizziness,   rash has evolved over several years worse area R neck attributed to reaction to otc cream but noted over other parts of face where no cream was used  Cough is better but still requires  rewwq tessilon All symptoms a lot better (x for the rash) while on short courses of prednisone New problem is painful swelling R elbow s trauma hx  rec Continue protonix and dymista as you are If cough or breathing worse, take prednisone 10 mg x 2 until better then 1 daily x one week then stop  We will call you with appt to see dermatology and orthopedics    - Skin bx 01/09/17 :   Pos granulomatous dermatitis > rec 01/21/2017 start plaquenil 200 mg daily    03/10/2017  f/u ov/Wert re:  Plaquenil x 01/21/17 @ 200 mg daily  Chief Complaint  Patient presents with  . Follow-up    Pt states he occ has a "strange sensation" in the right side of his chest. He states he feels tired most of the time, not much energy.    cough x onset 2011 then worse 09/2016 > resolved p prednisone and stayed gone p starting plaquenil as above Skin changes x 2016 also improving on plaquenil and steroid cream per derm  No need for prednisone since starting plaquenil  rec No change plaquenil dose for now  Please schedule a follow up office visit in 6 weeks, call sooner if needed with cxr on return  - consider taper off gerd rx if cough stays gone at next ov      02/25/2018  Pulmonary consultation: [facility] re: sarcoidosis on "prn prednisone/ did not maintain on plaquenil / needs clearance for R olecranon bursitis Chief Complaint  Patient presents with  . Advice Only    Needing pulm clearance for right elbow surgery. He has occ cough- non prod.     Not limited by breathing from desired activities   Min daytime dry cough attributes to pnds / some better on dymista when can afford it, does not recall resp to 1st gen H1 blockers per guidelines   No ocular complaints  Sleeps flat ok  rec If rash worse > return to dermatology  If nasal symptoms  worse > ent  See your eye doctor yearly    televist 09/06/18 History of Present Illness: 09/06/2018  f/u ov/Wert re: sarcoidosis / skin involvement/rheumatology  rec  plaqueninl 200 x 2 rec prednisone 5 mg  Per Ursula Alert x 20 mg x 4 days   Dyspnea:  Not limited by breathing from desired activities  / work out fine Cough: none on dymista /pantoprazole Sleeping: sleep flat  SABA use: none 02: none  rec Since your cough is better on dysmista and protonix and does not flare when you have perceive the sarcoid is getting worse it is unlikely related to your sarcoidosis so continue the dysmista/protonix  Ok to use protonix Take 30-60 min before first meal of the day but at onset of any cough you need to remember to Take 30- 60 min before your first and last meals of the day until cough is gone again I refilled your protonix today for the next 6 months but after that you will need to return here or see your PCP for this (as it's not really a pulmonary issue)  Take prednisone and plaquenil at your rheumatologists direction - the goal for plaquenil is to eventually wean you off of all prednisone.  Once the carona virus restrictions are listed we need to see you back here for PFT's/ ov so let's set this up for 6 months from now     12/08/2018  f/u ov/Wert re:  Sarcoidosis on plaquenil 200 x 2, no pred x months f/u by rheum Chief Complaint  Patient presents with  . Follow-up    No co's today   Dyspnea:  Good ex tol Cough: betterp dymista  Sleeping: ok SABA use: none  02: none    No obvious day to day or daytime variability or assoc excess/ purulent sputum or mucus plugs or hemoptysis or cp or chest tightness, subjective wheeze or overt sinus or hb symptoms.   Sleeping  without nocturnal  or early am exacerbation  of respiratory  c/o's or need for noct saba. Also denies any obvious fluctuation of symptoms with weather or environmental changes or other aggravating or alleviating factors except as outlined above   No unusual exposure hx or h/o childhood pna/ asthma or knowledge of premature birth.            OV 02/21/2020 -transfer of care from Dr. Christinia Gully to Dr. Chase Caller.  History is provided by the patient and review of the chart.  Subjective:  Patient ID: [name], male , DOB: 1976/02/11 , age 44 y.o. , MRN: [id number] , ADDRESS: [address] [place] [address]   02/21/2020 -   Chief Complaint  Patient presents with  . 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  Initially seen by Dr. Asencion Noble and then subsequently by Dr. Christinia Gully.  He says early on he was only treated with as needed prednisone with each course lasting approximately 1 month.  He does not remember being on chronic prednisone.  He has never been on immunomodulators.  He is always had a chronic cough which is a piece together was deemed as secondary to sinus issues also irritable larynx syndrome per review of the chart.  It appears a few years ago a chest x-ray was done and it showed significant pulmonary sarcoidosis.  At this point in time he had a skin biopsy of chronic lesions on his face and this was in 2018 or so which showed features consistent with sarcoid.  After that somewhere along the way he got established with rheumatology.  He has been recommended to continue to see pulmonary.  Then in February 2021 he was admitted for septic emboli and cavitary pneumonia.  According to the discharge summary no specific diagnosis for the pneumonia is indicated but review of the labs indicate Legionella urine antigen positivity. Visualization of the CT scan shows significant worsening in consolidation particularly in the right upper lobe compared to October 2020.  After this somewhere along the way he went on chronic prednisone.  After going on chronic prednisone multiple symptoms such as excessive daytime somnolence and fatigue have improved although not fully resolved.  He is known to have sleep apnea and  the results of below although he is not seeing a sleep specialist and is not using his CPAP even though has been advised to use 1.  In the past has had cough syncope and also excessive daytime somnolence and fatigue.  At this point in time his overall health status is somewhat better but he still has some residual dyspnea.  He denies any features of collagen vascular disease  Recent evaluation shows mild pulmonary venous hypertension, sleep apnea, significantly abnormal CT scan and elevated ACE level  In his February 2021 admission he was indeterminate for QuantiFERON gold.  His Covid was negative HIV negative cryptococcal antigen titers negative.  However he was Legionella positive    He reports he wants to get to the bottom of his problems    No flowsheet data found.   Ref Range & Units 7 mo ago  QuantiFERON Incubation  Incubation performed.   QuantiFERON-TB Gold Plus Negative Indeterminate   Results for YARON, GRASSE (MRN [id number]) as of 02/21/2020 16:21  Ref. Range 07/17/2019 16:56  Strep Pneumo Urinary Antigen Latest Ref Range: NEGATIVE  NEGATIVE  L. pneumophila Serogp 1 Ur Ag Latest Ref Range: Negative  Positive (A)  Results for YGNACIO, FECTEAU (MRN [id number]) as of 02/21/2020 16:21  Ref. Range 07/16/2019 17:57 07/17/2019 16:56  Strep Pneumo Urinary Antigen Latest Ref Range: NEGATIVE  NEGATIVE NEGATIVE  L. pneumophila Serogp 1 Ur Ag Latest Ref Range: Negative  Positive (A) Positive (A)   ROS - per HPI  Right heart catheterization January 2021 by Dr. Haroldine Laws  Findings:  RA = 9 RV = 36/12 PA = 38/6 (24) PCW = 11 Fick cardiac output/index = 7.1/2.7 PVR = 1.9 WU FA sat = 98% PA sat = 72%, 75% SVC sat = 75%  Assessment:  1. Very mild PAH with normal PVR  Plan/Discussion:  Recommend weight loss and CPAP.   Glori Bickers, MD  8:31 AM   Sleep study March 2021 interpreted by Dr. Golden Hurter -was a CPAP titration study  - An optimal PAP pressure was  selected for this patient ( 18 cm of water) - Central sleep apnea was not noted during this titration (CAI = 0.2/h). - Severe oxygen desaturations were observed during this titration (min O2 = 80.0%). - No snoring was audible during this study. - No cardiac abnormalities were observed during this study. - Clinically significant periodic limb movements were not noted during this study. Arousals associated with PLMs were rare.  Trial of CPAP therapy on 18 cm H2O with a Medium size Resmed Full Face Mask AirFit F20 mask and heated humidification -please not using  it   IMPRESSION: CT CHEST FEB 2021 - personally visualized and when compared to October 2020 it is worse -this admission February 2021 labeled as septic emboli and community-acquired pneumonia with cavitary pneumonia. 1. Unfortunately, there is limited opacification of the pulmonary arteries. There is no evidence of large central pulmonary embolus in the main pulmonary artery or main portions of the right and left pulmonary arteries. However, smaller emboli in the more peripheral branches of the pulmonary arteries cannot be excluded on the basis of this exam. 2. Stable calcified adenopathy is noted consistent with history of sarcoidosis. 3. Interval development of multiple rounded ill-defined opacities throughout both lungs concerning for multifocal pneumonia or septic emboli. The largest measures 9.7 x 8.6 cm in the right lung apex. Cavitary abnormality measuring 5.0 x 4.3 cm is noted in the left lower lobe posteriorly most consistent with cavitary pneumonia. 1.9 cm cavitary abnormality is also noted in the left lower lobe.   Electronically Signed   By: Marijo Conception M.D.   On: 07/16/2019 13:42   Results for SHANDON, BURLINGAME (MRN [id number]) as of 02/21/2020 12:09  Ref. Range 06/21/2010 20:05 12/18/2016 09:35 07/18/2019 11:09  IgE (Immunoglobulin E), Serum Latest Ref Range: 0.0 - 180.0 intl units/mL 27.6      Angiotensin-Converting Enzyme Latest Ref Range: 9 - 67 U/L  138 (H)   Cytoplasmic (C-ANCA) Latest Ref Range: Neg:<1:20 titer   <1:20  P-ANCA Latest Ref Range: Neg:<1:20 titer   <1:20  Atypical P-ANCA titer Latest Ref Range: Neg:<1:20 titer   <1:20   Results for GEROD, CALIGIURI (MRN [id number]) as of 02/21/2020 16:21  Ref. Range 10/14/2019 00:00 02/03/2020 09:27  Creatinine Latest Ref Range: 0.40 - 1.50 mg/dL 1.1 1.30  Results for ANTAWN, SISON (MRN [id number]) as of 02/21/2020 16:21  Ref. Range 07/20/2019 03:45 07/21/2019 04:06 08/01/2019 09:45 10/14/2019 00:00 02/03/2020 09:27  Hemoglobin Latest Ref Range: 13.0 - 17.0 g/dL 12.3 (L) 12.6 (L) 12.3 (L) 14.6 14.7    has a past medical history of Beta thalassemia minor, Chronic renal insufficiency, stage II (mild), Chronic rhinitis, Cutaneous sarcoidosis, Hoarseness (2015), HTN (hypertension), Hydrocele, LPRD (laryngopharyngeal reflux disease), Obesity, Class II, BMI 35-39.9, OSA (obstructive sleep apnea) (07/28/2019), Peripheral edema, Prediabetes (02/2017), Pulmonary arterial hypertension ([place]), Sarcoidosis of lung ([place]) (12/2016), and Vocal cord polyps (2015).   reports that he quit smoking about 24 years ago. His smoking use included cigarettes. He has a 0.45 pack-year smoking history. He has never used smokeless tobacco.  Past Surgical History:  Procedure Laterality Date  . OLECRANON BURSECTOMY Right 2019  . Polysomnogram  07/28/2019   Severe OSA w/hypoxia, mild central sleep apnea, CPAP recommended (Dr. Fransico Him)  . RIGHT HEART CATH N/A 06/27/2019   Mild PAH. Procedure: RIGHT HEART CATH;  Surgeon: Jolaine Artist, MD;  Location: [facility];  Service: Cardiovascular;  Laterality: N/A;  . 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  . SKIN GRAFT  2010   on left index finger  . TRANSTHORACIC ECHOCARDIOGRAM  06/27/2019   NORMAL  . Vocal cord surgery  2016   [facility]  . WISDOM TOOTH EXTRACTION  2005     Allergies  Allergen Reactions  . Wasp Venom Swelling    Immunization History  Administered Date(s) Administered  . Influenza,inj,Quad PF,6+ Mos 02/21/2020  . Influenza-Unspecified 04/08/2019  . PFIZER SARS-COV-2 Vaccination 08/20/2019, 09/10/2019  . Pneumococcal Polysaccharide-23 08/01/2019  . Tdap 04/20/2018    Family History  Problem Relation  Age of Onset  . Sarcoidosis Father        ?  Marland Kitchen Lactose intolerance Father   . Other Mother        colon issues-portion of colon removed  . Lactose intolerance Mother   . Cancer Paternal Grandfather        ?     Current Outpatient Medications:  .  amLODipine (NORVASC) 10 MG tablet, Take 1 tablet (10 mg total) by mouth daily., Disp: 90 tablet, Rfl: 3 .  Cetirizine HCl (ZYRTEC PO), Take 10 mg by mouth daily., Disp: , Rfl:  .  hydroxychloroquine (PLAQUENIL) 200 MG tablet, Take 400 mg by mouth daily., Disp: , Rfl:  .  metFORMIN (GLUCOPHAGE-XR) 500 MG 24 hr tablet, Take 1 tablet (500 mg total) by mouth daily with breakfast., Disp: 30 tablet, Rfl: 6 .  metoprolol succinate (TOPROL-XL) 25 MG 24 hr tablet, Take 1.5 tablets (37.5 mg total) by mouth daily., Disp: 45 tablet, Rfl: 3 .  pantoprazole (PROTONIX) 40 MG tablet, TAKE 1 TABLET BY MOUTH 30 TO 60 MINUTES BEFORE YOUR FIRST AND LAST MEALS OF THE DAY, Disp: 180 tablet, Rfl: 1 .  predniSONE (DELTASONE) 5 MG tablet, Take 10 mg by mouth daily. , Disp: , Rfl:  .  spironolactone (ALDACTONE) 25 MG tablet, TAKE 1 TABLET(25 MG) BY MOUTH DAILY, Disp: 90 tablet, Rfl: 0 .  Clocortolone Pivalate (CLODERM) 0.1 % cream, Apply 1 application topically 2 (two) times daily as needed (Sarcoidosis/rash).  (Patient not taking: Reported on 02/03/2020), Disp: , Rfl:  .  hydrocortisone butyrate (LUCOID) 0.1 % CREA cream, Apply 1 application topically 2 (two) times daily. (Patient not taking: Reported on 09/16/2019), Disp: 60 g, Rfl: 6 .  traZODone (DESYREL) 50 MG tablet, 1-2 tabs po qhs prn insomnia (Patient not  taking: Reported on 09/16/2019), Disp: 30 tablet, Rfl: 1      Objective:   Vitals:   02/21/20 1201  BP: (!) 140/96  Pulse: 87  Temp: 98 F (36.7 C)  TempSrc: Temporal  SpO2: 97%  Weight: (!) 300 lb 12.8 oz (136.4 kg)  Height: _0  (1.88 m)    Estimated body mass index is 38.62 kg/m as calculated from the following:   Height as of this encounter: _1  (1.88 m).   Weight as of this encounter: 300 lb 12.8 oz (136.4 kg).  _2 @  [employer]   02/21/20 1201  Weight: (!) 300 lb 12.8 oz (136.4 kg)     Physical Exam  General Appearance:    Alert, cooperative, no distress, appears stated age - yes , Deconditioned looking - no , OBESE  - yes, Sitting on Wheelchair -  no  Head:    Normocephalic, without obvious abnormality, atraumatic  Eyes:    PERRL, conjunctiva/corneas clear,  Ears:    Normal TM's and external ear canals, both ears  Nose:   Nares normal, septum midline, mucosa normal, no drainage    or sinus tenderness. OXYGEN ON  - no . Patient is @ ra   Throat:   Lips, mucosa, and tongue normal; teeth and gums normal. Cyanosis on lips - no  Neck:   Supple, symmetrical, trachea midline, no adenopathy;    thyroid:  no enlargement/tenderness/nodules; no carotid   bruit or JVD  Back:     Symmetric, no curvature, ROM normal, no CVA tenderness  Lungs:     Distress - no , Wheeze no, Barrell Chest - no, Purse lip breathing - no, Crackles - o  Chest Wall:    No tenderness or deformity.    Heart:    Regular rate and rhythm, S1 and S2 normal, no rub   or gallop, Murmur - no  Breast Exam:    NOT DONE  Abdomen:     Soft, non-tender, bowel sounds active all four quadrants,    no masses, no organomegaly. Visceral obesity - yes  Genitalia:   NOT DONE  Rectal:   NOT DONE  Extremities:   Extremities - normal, Has Cane - no, Clubbing - no, Edema - no  Pulses:   2+ and symmetric all extremities  Skin:   Stigmata of Connective Tissue Disease - no. LUPUS PERNIO +  Lymph nodes:    Cervical, supraclavicular, and axillary nodes normal  Psychiatric:  Neurologic:   Pleasant - yes, Anxious - no, Flat affect - no  CAm-ICU - neg, Alert and Oriented x 3 - yes, Moves all 4s - yes, Speech - normal, Cognition - intact           Assessment:       ICD-10-CM   1. Sarcoidosis with skin and lung involvement   D86.9 CT Chest High Resolution  2. Flu vaccine need  Z23   3. OSA (obstructive sleep apnea)  G47.33   4. Need for immunization against influenza  Z23 Flu Vaccine QUAD 36+ mos IM  5. 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  This includes underlying pulmonary sarcoidosis.  However in February 2021 it appears that on chart review that he has had Legionella pneumonia.  He seems better from this.  He is on chronic prednisone according to his history and this is since spring 2021.  It seems to be helping him subjectively although we need to fully understand its current role.  Also need to ensure that his pulmonary infiltrates from Legionella have cleared so we can see how much residual infiltrates he is from sarcoid.  Overall for his wellbeing allowed to concentrate on the sleep apnea.  For this we will referred to Dr. Golden Hurter    Plan:     Patient Instructions  Sarcoidosis with skin and lung involvement   - glad you are better with prednisone daily since winter/spring 2021 but we need to restage  Plan  - for now continue prednisone 67m per day  - do HRCT supine and prone - next few to several weeks  - do full PFT - next few to several weeks  Flu vaccine need  - get flu shot 02/21/2020   OSA (obstructive sleep apnea)  - some of your fatigue and excess sleepiness could be tied to sleep apnea  Plan  - refer Dr TFransico Him Followup    - next few to several weeks (can be oct too) but after completing above - see Dr RBrantley Persons - 30 min slot preferred but if not 15 min ok   ( Level 05 visit: Estb 40-54 min   in  visit type:  on-site physical face to visit  in total care time and counseling or/and coordination of care by this undersigned MD - Dr MBrand Males This includes one or more of the following on this same day 02/21/2020: pre-charting, chart review, note writing, documentation discussion of test results, diagnostic or treatment recommendations, prognosis, risks and benefits of management options, instructions, education, compliance or risk-factor reduction. It excludes time spent by the [facility] office staff in the care of the patient. Actual  time 50 min)   SIGNATURE    Dr. Brand Males, M.D., F.C.C.P,  Pulmonary and Critical Care Medicine Staff Physician, [place] Director - Interstitial Lung Disease  Program  Pulmonary [place] [facility] [place], [place], [address]  Pager: [phone], If no answer or between  15:00h - 7:00h: call [phone] Telephone: [phone]  12:48 PM 02/21/2020

## 2020-02-28 ENCOUNTER — Ambulatory Visit (INDEPENDENT_AMBULATORY_CARE_PROVIDER_SITE_OTHER)
Admission: RE | Admit: 2020-02-28 | Discharge: 2020-02-28 | Disposition: A | Discharge status: Home / Self Care | Payer: BC Managed Care – PPO | Source: Ambulatory Visit | Attending: Internal Medicine | Admitting: Internal Medicine

## 2020-02-28 ENCOUNTER — Other Ambulatory Visit: Payer: Self-pay

## 2020-02-28 DIAGNOSIS — D869 Sarcoidosis, unspecified: Secondary | ICD-10-CM

## 2020-03-07 ENCOUNTER — Other Ambulatory Visit: Payer: BC Managed Care – PPO

## 2020-03-07 DIAGNOSIS — Z20822 Contact with and (suspected) exposure to covid-19: Secondary | ICD-10-CM

## 2020-03-08 LAB — SARS-COV-2, NAA 2 DAY TAT

## 2020-03-08 LAB — NOVEL CORONAVIRUS, NAA: SARS-CoV-2, NAA: NOT DETECTED

## 2020-03-19 ENCOUNTER — Ambulatory Visit (HOSPITAL_COMMUNITY)
Admission: RE | Admit: 2020-03-19 | Discharge: 2020-03-19 | Disposition: A | Discharge status: Home / Self Care | Payer: BC Managed Care – PPO | Source: Ambulatory Visit | Attending: Internal Medicine | Admitting: Internal Medicine

## 2020-03-19 ENCOUNTER — Other Ambulatory Visit: Payer: Self-pay

## 2020-03-19 VITALS — BP 162/98 | HR 83 | Wt 295.5 lb

## 2020-03-19 DIAGNOSIS — Z7952 Long term (current) use of systemic steroids: Secondary | ICD-10-CM | POA: Diagnosis not present

## 2020-03-19 DIAGNOSIS — N182 Chronic kidney disease, stage 2 (mild): Secondary | ICD-10-CM | POA: Diagnosis not present

## 2020-03-19 DIAGNOSIS — I2510 Atherosclerotic heart disease of native coronary artery without angina pectoris: Secondary | ICD-10-CM | POA: Insufficient documentation

## 2020-03-19 DIAGNOSIS — I129 Hypertensive chronic kidney disease with stage 1 through stage 4 chronic kidney disease, or unspecified chronic kidney disease: Secondary | ICD-10-CM | POA: Diagnosis not present

## 2020-03-19 DIAGNOSIS — I2721 Secondary pulmonary arterial hypertension: Secondary | ICD-10-CM | POA: Insufficient documentation

## 2020-03-19 DIAGNOSIS — D860 Sarcoidosis of lung: Secondary | ICD-10-CM | POA: Insufficient documentation

## 2020-03-19 DIAGNOSIS — G4730 Sleep apnea, unspecified: Secondary | ICD-10-CM

## 2020-03-19 DIAGNOSIS — Z9119 Patient's noncompliance with other medical treatment and regimen: Secondary | ICD-10-CM | POA: Insufficient documentation

## 2020-03-19 DIAGNOSIS — G4733 Obstructive sleep apnea (adult) (pediatric): Secondary | ICD-10-CM | POA: Insufficient documentation

## 2020-03-19 DIAGNOSIS — R7303 Prediabetes: Secondary | ICD-10-CM | POA: Diagnosis not present

## 2020-03-19 DIAGNOSIS — I272 Pulmonary hypertension, unspecified: Secondary | ICD-10-CM

## 2020-03-19 DIAGNOSIS — Z79899 Other long term (current) drug therapy: Secondary | ICD-10-CM | POA: Insufficient documentation

## 2020-03-19 DIAGNOSIS — K219 Gastro-esophageal reflux disease without esophagitis: Secondary | ICD-10-CM | POA: Insufficient documentation

## 2020-03-19 DIAGNOSIS — Z87891 Personal history of nicotine dependence: Secondary | ICD-10-CM | POA: Insufficient documentation

## 2020-03-19 DIAGNOSIS — Z7984 Long term (current) use of oral hypoglycemic drugs: Secondary | ICD-10-CM | POA: Insufficient documentation

## 2020-03-19 DIAGNOSIS — D869 Sarcoidosis, unspecified: Secondary | ICD-10-CM

## 2020-03-19 DIAGNOSIS — I10 Essential (primary) hypertension: Secondary | ICD-10-CM

## 2020-03-19 DIAGNOSIS — Z6837 Body mass index (BMI) 37.0-37.9, adult: Secondary | ICD-10-CM | POA: Diagnosis not present

## 2020-03-19 MED ORDER — LOSARTAN POTASSIUM 50 MG PO TABS: 50.0000 mg | ORAL_TABLET | Freq: Every day | ORAL | 3 refills | Status: DC

## 2020-03-19 NOTE — Addendum Note (Signed)
Encounter addended by: Jolaine Artist, MD on: 03/19/2020 3:59 PM  Actions taken: Level of Service modified, Visit diagnoses modified

## 2020-03-19 NOTE — Progress Notes (Signed)
ADVANCED HF CLINIC NOTE  Referring Physician: Marella Chimes PA [facility]) Primary Care: [facility] Primary Cardiologist: New  HPI:  44 y/o morbidly obese male with HTN, sarcoidosis, borderline DM2, OSA (noncompliant with CPAP) referred by Marella Chimes, PA for further evaluation of [place] in the setting of sarcoidosis.   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 Melvyn Novas. . Now on prednisone with plaquenil daily with Rheumatology. Recently had chest CT which showed dilated PA.  Has OSA and has CPAP but non compliant with it because he doesn't feel like it works. Does not have a sleep apnea doc.  Graduated HS at 185 pounds. Smoked minimally quit 1997. No h/o DVT/PE.  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   I first saw him in 1/21 for dilated pulmonary artery and suspicion of PAH.  RHC showed just mildly elevated PAR pressures with normal PCWP and PVR. PFTs ordered but deferred due to COVID (scheduled for next week). Sleep study 3/21 severe OSA AHI was 84.6 (!). Has not seen Dr. Radford Pax yet.  Here for f/u. Says he feels great. Now seeing Dr. Chase Caller. Stays pretty active. Says he can walk 2 miles without problem. No edema, orthopnea or PND.    Hires CT 9/21 1. Resolved bilateral multi lobar pneumonia. Residual postinfectious pneumatocele in the right upper lobe. 2. Extensive pulmonary parenchymal findings of sarcoidosis are stable in the interval since 07/16/2019 as detailed. 3. Stable calcified mediastinal and bilateral hilar adenopathy, compatible with sarcoidosis. 4. One vessel coronary atherosclerosis. 5. Symmetric mild bilateral gynecomastia, worsened.   RHC 1/21 RA = 9 RV = 36/12 PA = 38/6 (24) PCW = 11 Fick cardiac output/index = 7.1/2.7 PVR = 1.9 WU FA sat = 98% PA sat = 72%, 75% SVC sat = 75%  Echo 1/21 EF 60-65% RV ok not enough TR to estimate RVSP   CT 10/20 1. Mediastinal, hilar and pulmonary parenchymal changes of sarcoid, the  latter of which appear mildly progressive from 01/05/2017. 2. Enlarged pulmonic trunk, indicative of pulmonary arterial hypertension.   Past Medical History:  Diagnosis Date  . 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  Hemoglobin electrophoresis normal 2016.  Marland Kitchen Chronic renal insufficiency, stage II (mild)   . Chronic rhinitis   . Cutaneous sarcoidosis    [place] rheum: as of 02/2019->plaquenil.  Cont plaquenil and slowly tapering prednisone as of 06/2019 rheum f/u  . Hoarseness 2015   [facility] 04/2014-normal laryngoscopy: prednisone taper and bid nexium recommended.  No signif help so pt got 2nd opinion at [facility]   . HTN (hypertension)   . Hydrocele    left  . LPRD (laryngopharyngeal reflux disease)   . Obesity, Class II, BMI 35-39.9   . OSA (obstructive sleep apnea) 07/28/2019   SEVERE OSA, mild central sleep apnea, CPAP trial 08/2019 (Dr. Fransico Him)  . Peripheral edema    left leg>right leg  . Prediabetes 02/2017   A1c 6.3% (right when he was starting to take prednisone prn for treatment of his sarcoidosis).  HbA1c 08/2017=6.3%. A1c 01/2020 6.4%.  . Pulmonary arterial hypertension (HCC)    Mild (R heart cath 06/27/19), echo 06/2019 normal.  Wt loss and CPAP recommended.  . 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 Melvyn Novas suspects it was smoldering since 2011.  Skin bx of cutaneous lesion confirmed dx of sarcoidosis 02/2017.  Pt improving fall 2018 with plaquenil and prednisone.  Pt self d/c'd plaquenil.  . Vocal cord  polyps 2015   surgery [facility]     Current Outpatient Medications  Medication Sig Dispense Refill  . amLODipine (NORVASC) 10 MG tablet Take 1 tablet (10 mg total) by mouth daily. 90 tablet 3  . Cetirizine HCl (ZYRTEC PO) Take 10 mg by mouth daily.    . Clocortolone Pivalate (CLODERM) 0.1 % cream Apply 1 application topically 2 (two) times daily as needed (Sarcoidosis/rash).     . hydrocortisone butyrate (LUCOID) 0.1 % CREA cream Apply 1 application topically 2 (two) times  daily. 60 g 6  . hydroxychloroquine (PLAQUENIL) 200 MG tablet Take 400 mg by mouth daily.    . metFORMIN (GLUCOPHAGE-XR) 500 MG 24 hr tablet Take 1 tablet (500 mg total) by mouth daily with breakfast. 30 tablet 6  . metoprolol succinate (TOPROL-XL) 25 MG 24 hr tablet Take 1.5 tablets (37.5 mg total) by mouth daily. 45 tablet 3  . pantoprazole (PROTONIX) 40 MG tablet TAKE 1 TABLET BY MOUTH 30 TO 60 MINUTES BEFORE YOUR FIRST AND LAST MEALS OF THE DAY 180 tablet 1  . predniSONE (DELTASONE) 5 MG tablet Take 5 mg by mouth daily with breakfast.    . spironolactone (ALDACTONE) 25 MG tablet TAKE 1 TABLET(25 MG) BY MOUTH DAILY 90 tablet 0   No current facility-administered medications for this encounter.    Allergies  Allergen Reactions  . Wasp Venom Swelling      Social History   Socioeconomic History  . Marital status: Married    Spouse name: Not on file  . Number of children: 2  . Years of education: Not on file  . Highest education level: Not on file  Occupational History  . Occupation: Clinical biochemist  Tobacco Use  . Smoking status: Former Smoker    Packs/day: 0.30    Years: 1.50    Pack years: 0.45    Types: Cigarettes    Quit date: 06/10/1995    Years since quitting: 24.7  . Smokeless tobacco: Never Used  Substance and Sexual Activity  . Alcohol use: Yes    Comment: occasional  . Drug use: No  . Sexual activity: Not on file  Other Topics Concern  . Not on file  Social History Narrative   Married, 2 children (83 and 11 y/o).   Occupation: Clinical biochemist Editor, commissioning).   Orig from [employer].   No Tobacco.  Rare alcohol.  No drugs.   Enjoys fishing, Environmental health practitioner, wood working, Research officer, trade union.      Social Determinants of Health   Financial Resource Strain:   . Difficulty of Paying Living Expenses: Not on file  Food Insecurity:   . Worried About Charity fundraiser in the Last Year: Not on file  . Ran Out of Food in the Last Year: Not on file  Transportation  Needs:   . Lack of Transportation (Medical): Not on file  . Lack of Transportation (Non-Medical): Not on file  Physical Activity:   . Days of Exercise per Week: Not on file  . Minutes of Exercise per Session: Not on file  Stress:   . Feeling of Stress : Not on file  Social Connections:   . Frequency of Communication with Friends and Family: Not on file  . Frequency of Social Gatherings with Friends and Family: Not on file  . Attends Religious Services: Not on file  . Active Member of Clubs or Organizations: Not on file  . Attends Archivist Meetings: Not on file  . Marital Status: Not on  file  Intimate Partner Violence:   . Fear of Current or Ex-Partner: Not on file  . Emotionally Abused: Not on file  . Physically Abused: Not on file  . Sexually Abused: Not on file      Family History  Problem Relation Age of Onset  . Sarcoidosis Father        ?  Marland Kitchen Lactose intolerance Father   . Other Mother        colon issues-portion of colon removed  . Lactose intolerance Mother   . Cancer Paternal Grandfather        ?    Vitals:   03/19/20 1517  BP: (!) 162/98  Pulse: 83  SpO2: 96%  Weight: 134 kg (295 lb 8 oz)   Wt Readings from Last 3 Encounters:  03/19/20 134 kg (295 lb 8 oz)  02/21/20 (!) 136.4 kg (300 lb 12.8 oz)  02/03/20 136.1 kg (300 lb)    PHYSICAL EXAM: General:  Well appearing. No resp difficulty HEENT: normal Neck: supple. no JVD. Carotids 2+ bilat; no bruits. No lymphadenopathy or thryomegaly appreciated. Cor: PMI nondisplaced. Regular rate & rhythm. No rubs, gallops or murmurs. Lungs: clear Abdomen: obese soft, nontender, nondistended. No hepatosplenomegaly. No bruits or masses. Good bowel sounds. Extremities: no cyanosis, clubbing, rash, edema Neuro: alert & orientedx3, cranial nerves grossly intact. moves all 4 extremities w/o difficulty. Affect pleasant   ASSESSMENT & PLAN:  1. Enlarged pulmonary artery in setting of severe pulmonary sarcoid  and OSA - [facility] 1/21 EF 60%. RV ok. No evidence of PAH or RV strain - RHC with very mild PAH with normal PVR - PSG with very severe OSA (AHI 84). Suspect this is major contributor to [facility] - Hi-res CT 9/21 severe pulmonary sarcoid - Await PFTs - repeat echo in 6 months to follow  2. OSA - PSG 1/21 with very severe OSA (AHI 84).  - Will arrange f/u with Dr. Radford Pax - Discussed need for weight loss  3. HTN - BP up - add losartan 50 daily - Needs OSA treated  4. Morbid obesity - Previously discussed [employer]  5. Severe pulmonary sarcoid - following with Dr. Chase Caller  - continue current therapy  Glori Bickers, MD  3:31 PM

## 2020-03-19 NOTE — Patient Instructions (Signed)
No labs done today.   START Losartan 18m(1 tablet) by mouth daily.  No other medication changes were made. Please continue all other medications as prescribed.  You have been referred to Dr. TFransico Himfor Sleep Apnea([facility] office-[address]).Her office will contact you to schedule an appointment.  Your physician wants you to follow-up in: 6 months You will receive a reminder letter in the mail two months in advance. If you don't receive a letter, please call our office to schedule the follow-up appointment.  If you have any questions or concerns before your next appointment please send [place] message through [place] call our office at [phone]    TO LEAVE A MESSAGE FOR THE NURSE SELECT OPTION 2, PLEASE LEAVE A MESSAGE INCLUDING: . YOUR NAME . DATE OF BIRTH . CALL BACK NUMBER . REASON FOR CALL**this is important as we prioritize the call backs  [place] LONG AS YOU CALL BEFORE 4:00 PM   At [facility] you and your health needs are our priority. As part of our continuing mission to provide you with exceptional heart care, we have created designated Provider Care Teams. These Care Teams include your primary Cardiologist (physician) and Advanced Practice Providers (APPs- Physician Assistants and Nurse Practitioners) who all work together to provide you with the care you need, when you need it.   You may see any of the following providers on your designated Care Team at your next follow up: .Marland KitchenDr DGlori Bickers. Dr DLoralie Champagne. ADarrick Grinder NP . BLyda Jester PA . LAudry Riles PharmD   Please be sure to bring in all your medications bottles to every appointment.

## 2020-04-05 ENCOUNTER — Other Ambulatory Visit: Payer: Self-pay

## 2020-04-05 ENCOUNTER — Ambulatory Visit (INDEPENDENT_AMBULATORY_CARE_PROVIDER_SITE_OTHER): Payer: BC Managed Care – PPO | Admitting: Internal Medicine

## 2020-04-05 ENCOUNTER — Ambulatory Visit: Payer: BC Managed Care – PPO | Admitting: Internal Medicine

## 2020-04-05 ENCOUNTER — Encounter: Payer: Self-pay | Admitting: Internal Medicine

## 2020-04-05 VITALS — BP 130/80 | HR 78 | Temp 98.1°F | Ht 74.0 in | Wt 299.0 lb

## 2020-04-05 DIAGNOSIS — D869 Sarcoidosis, unspecified: Secondary | ICD-10-CM | POA: Diagnosis not present

## 2020-04-05 DIAGNOSIS — I272 Pulmonary hypertension, unspecified: Secondary | ICD-10-CM | POA: Diagnosis not present

## 2020-04-05 LAB — PULMONARY FUNCTION TEST
DL/VA % pred: 121 %
DL/VA: 5.46 ml/min/mmHg/L
DLCO cor % pred: 73 %
DLCO cor: 25.13 ml/min/mmHg
DLCO unc % pred: 73 %
DLCO unc: 25.13 ml/min/mmHg
FEF 25-75 Post: 2.31 L/sec
FEF 25-75 Pre: 1.98 L/sec
FEF2575-%Change-Post: 16 %
FEF2575-%Pred-Post: 57 %
FEF2575-%Pred-Pre: 49 %
FEV1-%Change-Post: 4 %
FEV1-%Pred-Post: 65 %
FEV1-%Pred-Pre: 62 %
FEV1-Post: 2.6 L
FEV1-Pre: 2.48 L
FEV1FVC-%Change-Post: 2 %
FEV1FVC-%Pred-Pre: 92 %
FEV6-%Change-Post: 1 %
FEV6-%Pred-Post: 69 %
FEV6-%Pred-Pre: 68 %
FEV6-Post: 3.35 L
FEV6-Pre: 3.3 L
FEV6FVC-%Change-Post: 0 %
FEV6FVC-%Pred-Post: 102 %
FEV6FVC-%Pred-Pre: 102 %
FVC-%Change-Post: 2 %
FVC-%Pred-Post: 68 %
FVC-%Pred-Pre: 66 %
FVC-Post: 3.38 L
FVC-Pre: 3.3 L
Post FEV1/FVC ratio: 77 %
Post FEV6/FVC ratio: 100 %
Pre FEV1/FVC ratio: 75 %
Pre FEV6/FVC Ratio: 100 %
RV % pred: 45 %
RV: 0.97 L
TLC % pred: 62 %
TLC: 4.87 L

## 2020-04-05 NOTE — Patient Instructions (Addendum)
Sarcoidosis with skin and lung involvement   - glad you are better with prednisone daily since winter/spring 2021 but we need to restage - currently still on prednisome 22m per day -CT scan of the chest definitely shows improvement in clearance of the Legionella pneumonia but what I do not know is whether your sarcoidosis is currently fibrotic versus inflammatory versus improved over time since 2018  Plan  - for now continue prednisone 537mper day  -I have written to the radiologist to get a radiologic opinion on your CT scan -based on this we might do observation follow-up or just continue prednisone or add methotrexate to try to see if he can improve your lung health -I do worry the persistent inflammation or scar tissue in your lungs because of sarcoid without any clearance and over decades can affect your heart   OSA (obstructive sleep apnea)  - some of your fatigue and excess sleepiness could be tied to sleep apnea  Plan  -Keep appointment with Dr. TrGolden Hurter Followup    -Await our call` with radiology opinion.  If he decided to methotrexate we will set up a telephone visit or a pharmacy consult to discuss methotrexate [you might need some lab test such as QuantiFERON gold before we do methotrexate]

## 2020-04-05 NOTE — Progress Notes (Signed)
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   1997 s sequelae 2006  With exp to foam bad cough resolved w/in 3 days of avoidance and did fine until 2011 really bad cold > chronic cough eval by Dr Joya Gaskins 04/2010 with suspicion for sarcoid (pos in father) but CT with only nonspecific adenopathy  waxes and wanes since then with extensive w/u / rx at [facility] voice center (reviewed in care everywhere)  And much  worse x 09/2016 assoc with hb/ worse with certain foods and some better dymista self referred to pulmonary clinic 12/11/2016    History of Present Illness  12/11/2016 1st [facility] office visit/ [facility]   Chief Complaint  [name], and has "passed out" once before due to cough.    cough x 6 years  Citric drinks make it worse  Allergy eval [place] allergy  nexium as walking the door     Kouffman Reflux v Neurogenic Cough Differentiator Reflux Comments  Do you awaken from a sound sleep coughing violently?                            With trouble breathing? No   Do you have choking episodes when you cannot  Get enough air, gasping for air ?              Yes   Do you usually cough when you lie down into  The bed, or when you just lie down to rest ?                          Sometimes depending on what eaten   Do you usually cough after meals or eating?         Yes   Do you cough when (or after) you bend over?    Yes if full   GERD SCORE     Kouffman Reflux v Neurogenic Cough Differentiator Neurogenic   Do you more-or-less cough all day long? Sporadically    Does change of temperature make you cough? Not much   Does laughing or chuckling cause you to cough? yes   Do fumes (perfume, automobile fumes, burned  Toast, etc.,) cause  you to cough ?      Can, esp cooking on grill   Does speaking, singing, or talking on the phone cause you to cough   ?               No    Neurogenic/Airway score      rec Protonix (pantoprazole) 40 mg  Take 30- 60 min before your first and last meals of the day  Continue dymista one twice daily - point toward ear on same side For drainage / throat tickle as need  >>>   take CHLORPHENIRAMINE  4 mg - take one every 4 hours as needed -  For cough >>>   tessilon 200 mg three to four times daily  GERD (REFLUX)   Please remember to go to the x-ray department downstairs in the basement  for your tests - we will call you with the results when they are available. If  not better at 6 weeks you need to return to [facility] - if better then ok to refill meds thru your PCP or return here at 3 months to regroup re need for longterm treatment options - late add:  ? pna in rll/ ? Sarcoid changes also rec:  Return for esr/ cmet/ angiotensin, cbc with diff and rx Augmentin 875 mg take one pill twice daily  X 10 daysAnd  Prednisone 10 mg take  4 each am x 2 days,   2 each am x 2 days,  1 each am x 2 days and stop  And f/u with cxr in 2 weeks =  No change 12/29/16  - Angiotensin  12/18/16   138 with esr 44  - CT 01/05/2017   Extensive partially calcified mediastinal and bilateral hilar lymphadenopathy with extensive perilymphatic nodularity throughout the lungs bilaterally (right greater than left). This spectrum of imaging findings is compatible with the clinically suspected sarcoidosis.       01/06/2017  f/u ov/[facility]ert re: probable sarcoid / ?uacs on gerd rx  Chief Complaint  Patient presents with  . Follow-up    Pt hwew to discuss CT scan, He still has occ. sob,he is still coughing which causes him some dizziness,   rash has evolved over several years worse area R neck attributed to reaction to otc cream but noted over other parts of face where no cream was used  Cough is better but still requires rewwq  tessilon All symptoms a lot better (x for the rash) while on short courses of prednisone New problem is painful swelling R elbow s trauma hx  rec Continue protonix and dymista as you are If cough or breathing worse, take prednisone 10 mg x 2 until better then 1 daily x one week then stop  We will call you with appt to see dermatology and orthopedics    - Skin bx 01/09/17 :   Pos granulomatous dermatitis > rec 01/21/2017 start plaquenil 200 mg daily    03/10/2017  f/u ov/Wert re:  Plaquenil x 01/21/17 @ 200 mg daily  Chief Complaint  Patient presents with  . Follow-up    Pt states he occ has a "strange sensation" in the right side of his chest. He states he feels tired most of the time, not much energy.    cough x onset 2011 then worse 09/2016 > resolved p prednisone and stayed gone p starting plaquenil as above Skin changes x 2016 also improving on plaquenil and steroid cream per derm  No need for prednisone since starting plaquenil  rec No change plaquenil dose for now  Please schedule a follow up office visit in 6 weeks, call sooner if needed with cxr on return  - consider taper off gerd rx if cough stays gone at next ov      02/25/2018  Pulmonary consultation: [facility] re: sarcoidosis on "prn prednisone/ did not maintain on plaquenil / needs clearance for R olecranon bursitis Chief Complaint  Patient presents with  . Advice Only    Needing pulm clearance for right elbow surgery. He has occ cough- non prod.     Not limited by breathing from desired activities   Min daytime dry cough attributes to pnds / some better on dymista when can afford it, does not recall resp to 1st gen H1 blockers per guidelines   No ocular complaints  Sleeps flat ok  rec If rash worse > return to dermatology  If nasal symptoms worse > ent  See your eye doctor yearly    televist 09/06/18 History of Present Illness: 09/06/2018  f/u ov/[facility]ert re: sarcoidosis / skin involvement/rheumatology  rec plaqueninl 200  x 2 rec prednisone 5 mg  Per Ursula Alert x 20 mg x 4 days   Dyspnea:  Not limited by breathing from desired activities  / work out fine Cough: none on dymista /pantoprazole Sleeping: sleep flat  SABA use: none 02: none  rec Since your cough is better on dysmista and protonix and does not flare when you have perceive the sarcoid is getting worse it is unlikely related to your sarcoidosis so continue the dysmista/protonix  Ok to use protonix Take 30-60 min before first meal of the day but at onset of any cough you need to remember to Take 30- 60 min before your first and last meals of the day until cough is gone again I refilled your protonix today for the next 6 months but after that you will need to return here or see your PCP for this (as it's not really a pulmonary issue)  Take prednisone and plaquenil at your rheumatologists direction - the goal for plaquenil is to eventually wean you off of all prednisone.  Once the carona virus restrictions are listed we need to see you back here for PFT's/ ov so let's set this up for 6 months from now     12/08/2018  f/u ov/[facility]ert re:  Sarcoidosis on plaquenil 200 x 2, no pred x months f/u by rheum Chief Complaint  Patient presents with  . Follow-up    No co's today   Dyspnea:  Good ex tol Cough: betterp dymista  Sleeping: ok SABA use: none  02: none    No obvious day to day or daytime variability or assoc excess/ purulent sputum or mucus plugs or hemoptysis or cp or chest tightness, subjective wheeze or overt sinus or hb symptoms.   Sleeping  without nocturnal  or early am exacerbation  of respiratory  c/o's or need for noct saba. Also denies any obvious fluctuation of symptoms with weather or environmental changes or other aggravating or alleviating factors except as outlined above   No unusual exposure hx or h/o childhood pna/ asthma or knowledge of premature birth.            OV 02/21/2020 -transfer of care from Dr. Christinia Gully to Dr.  Chase Caller.  History is provided by the patient and review of the chart.  Subjective:  Patient ID: Sean Sharp, male , DOB: July 12, 1975 , age 7 y.o. , MRN: [id number] , ADDRESS: [address] [place] [address]   02/21/2020 -   Chief Complaint  Patient presents with  . 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  Initially seen by Dr. Asencion Noble and then subsequently by Dr. Christinia Gully.  He says early on he was only treated with as needed prednisone with each course lasting approximately 1 month.  He does not remember being on chronic prednisone.  He has never been on immunomodulators.  He is always had a chronic cough which is a piece together was deemed as secondary to sinus issues also irritable larynx syndrome per review of the chart.  It appears a few years ago a chest x-ray was done and it showed significant pulmonary sarcoidosis.  At this point  in time he had a skin biopsy of chronic lesions on his face and this was in 2018 or so which showed features consistent with sarcoid.  After that somewhere along the way he got established with rheumatology.  He has been recommended to continue to see pulmonary.  Then in February 2021 he was admitted for septic emboli and cavitary pneumonia.  According to the discharge summary no specific diagnosis for the pneumonia is indicated but review of the labs indicate Legionella urine antigen positivity. Visualization of the CT scan shows significant worsening in consolidation particularly in the right upper lobe compared to October 2020.  After this somewhere along the way he went on chronic prednisone.  After going on chronic prednisone multiple symptoms such as excessive daytime somnolence and fatigue have improved although not fully resolved.  He is known to have sleep apnea and the results  of below although he is not seeing a sleep specialist and is not using his CPAP even though has been advised to use 1.  In the past has had cough syncope and also excessive daytime somnolence and fatigue.  At this point in time his overall health status is somewhat better but he still has some residual dyspnea.  He denies any features of collagen vascular disease  Recent evaluation shows mild pulmonary venous hypertension, sleep apnea, significantly abnormal CT scan and elevated ACE level  In his February 2021 admission he was indeterminate for QuantiFERON gold.  His Covid was negative HIV negative cryptococcal antigen titers negative.  However he was Legionella positive    He reports he wants to get to the bottom of his problems    No flowsheet data found.   Ref Range & Units 7 mo ago  QuantiFERON Incubation  Incubation performed.   QuantiFERON-TB Gold Plus Negative Indeterminate   Results for DAWN, KIPER (MRN [id number]) as of 02/21/2020 16:21  Ref. Range 07/17/2019 16:56  Strep Pneumo Urinary Antigen Latest Ref Range: NEGATIVE  NEGATIVE  L. pneumophila Serogp 1 Ur Ag Latest Ref Range: Negative  Positive (A)  Results for LATROY, GAYMON (MRN [id number]) as of 02/21/2020 16:21  - per HPI  Right heart catheterization January 2021 by Dr. Haroldine Laws  Findings:  RA = 9 RV = 36/12 PA = 38/6 (24) PCW = 11 Fick cardiac output/index = 7.1/2.7 PVR = 1.9 WU FA sat = 98% PA sat = 72%, 75% SVC sat = 75%  Assessment:  1. Very mild PAH with normal PVR  Plan/Discussion:  Recommend weight loss and CPAP.   Glori Bickers, MD  8:31 AM   Sleep study March 2021 interpreted by Dr. Golden Hurter -was a CPAP titration study  - An optimal PAP pressure was selected for this patient ( 18 cm of water) - Central sleep apnea was not noted during this titration (CAI = 0.2/h). - Severe oxygen desaturations were observed during this titration (min O2 = 80.0%). - No snoring was  audible during this study. - No cardiac abnormalities were observed during this study. - Clinically significant periodic limb movements were not noted during this study. Arousals associated with PLMs were rare.  Trial of CPAP therapy on 18 cm H2O with a Medium size Resmed Full Face Mask AirFit F20 mask and heated humidification -please not using it   IMPRESSION: CT CHEST FEB 2021 - personally visualized and when compared to October 2020 it is worse -this admission February 2021 labeled as septic emboli and community-acquired pneumonia with cavitary pneumonia. 1. Unfortunately, there is limited opacification  of the pulmonary arteries. There is no evidence of large central pulmonary embolus in the main pulmonary artery or main portions of the right and left pulmonary arteries. However, smaller emboli in the more peripheral branches of the pulmonary arteries cannot be excluded on the basis of this exam. 2. Stable calcified adenopathy is noted consistent with history of sarcoidosis. 3. Interval development of multiple rounded ill-defined opacities throughout both lungs concerning for multifocal pneumonia or septic emboli. The largest measures 9.7 x 8.6 cm in the right lung apex. Cavitary abnormality measuring 5.0 x 4.3 cm is noted in the left lower lobe posteriorly most consistent with cavitary pneumonia. 1.9 cm cavitary abnormality is also noted in the left lower lobe.   Electronically Signed   By: Marijo Conception M.D.   On: 07/16/2019 13:42   Results for LORON, WEIMER (MRN [id number]) as of 02/21/2020 12:09  Ref. Range 06/21/2010 20:05 12/18/2016 09:35 07/18/2019 11:09  IgE (Immunoglobulin E), Serum Latest Ref Range: 0.0 - 180.0 intl units/mL 27.6    Angiotensin-Converting Enzyme Latest Ref Range: 9 - 67 U/L  138 (H)   Cytoplasmic (C-ANCA) Latest Ref Range: Neg:<1:20 titer   <1:20  P-ANCA Latest Ref Range: Neg:<1:20 titer   <1:20  Atypical P-ANCA titer Latest Ref Range: Neg:<1:20  titer   <1:20   Results for ANVAY, TENNIS (MRN [id number]) as of 02/21/2020 16:21  Ref. Range 10/14/2019 00:00 02/03/2020 09:27  Creatinine Latest Ref Range: 0.40 - 1.50 mg/dL 1.1 1.30  Results for BROLY, HATFIELD (MRN [id number]) as of 02/21/2020 16:21  Ref. Range 07/20/2019 03:45 07/21/2019 04:06 08/01/2019 09:45 10/14/2019 00:00 02/03/2020 09:27  Hemoglobin Latest Ref Range: 13.0 - 17.0 g/dL 12.3 (L) 12.6 (L) 12.3 (L) 14.6 14.7      OV 04/05/2020  Subjective:  Patient ID: Sean Sharp, male , DOB: 1976-05-28 , age 73 y.o. , MRN: [id number] , ADDRESS: [address] [place] [address] PCP McGowen, Adrian Blackwater, MD Patient Care Team: Tammi Sou, MD as PCP - General (Family Medicine) Ruby Cola, MD as Consulting Physician (Otolaryngology) Chyrel Masson, DO as Consulting Physician (Otolaryngology) Harold Hedge, Darrick Grinder, MD as Consulting Physician (Allergy and Immunology) Tanda Rockers, MD as Consulting Physician (Pulmonary Disease) Marchia Bond, MD as Consulting Physician (Orthopedic Surgery) Rosita Kea, PA-C as Consulting Physician (Rheumatology) Bensimhon, Shaune Pascal, MD as Consulting Physician (Cardiology)  This Provider for this visit: Treatment Team:  Attending Provider: Brand Males, MD    04/05/2020 -   Chief Complaint  Patient presents with  . Follow-up    PFT performed today. Pt states he has been doing well since last visit and denies any complaints.     HPI Chinedum Vanhouten 44 y.o. - returns for follow-up.  At this point in time he is minimally symptomatic.  He still continues on his prednisone 7 spring of this year.  He says he has had his Covid vaccine but is in need of a booster.  He is yet to see his sleep specialist.  He had pulmonary function test that shows mild restriction to moderate restriction.  But he is essentially asymptomatic from a respiratory standpoint.  He had follow-up CT scan of the chest that shows improvement and resolution  of the Legionella pneumonia that he suffered in February 2021.  However in comparing to old CT scans of the chest - he did NOT have sarcoidosis in 2011 but was present in 2018 and oct 2020. In both seemed inflammatory but now Oct 2021 seems  fibrotic      PFT Results Latest Ref Rng & Units 04/05/2020  FVC-Pre L 3.30  FVC-Predicted Pre % 66  FVC-Post L 3.38  FVC-Predicted Post % 68  Pre FEV1/FVC % % 75  Post FEV1/FCV % % 77  FEV1-Pre L 2.48  FEV1-Predicted Pre % 62  FEV1-Post L 2.60  DLCO uncorrected ml/min/mmHg 25.13  DLCO UNC% % 73  DLCO corrected ml/min/mmHg 25.13  DLCO COR %Predicted % 73  DLVA Predicted % 121  TLC L 4.87  TLC % Predicted % 62  RV % Predicted % 45     CT chest high resolution Sept 2021  IMPRESSION: 1. Resolved bilateral multi lobar pneumonia. Residual postinfectious pneumatocele in the right upper lobe. 2. Extensive pulmonary parenchymal findings of sarcoidosis are stable in the interval since 07/16/2019 as detailed. 3. Stable calcified mediastinal and bilateral hilar adenopathy, compatible with sarcoidosis. 4. One vessel coronary atherosclerosis. 5. Symmetric mild bilateral gynecomastia, worsened.   Electronically Signed   By: Ilona Sorrel M.D.   On: 02/28/2020 16:32   ROS - per HPI     has a past medical history of Beta thalassemia minor, Chronic renal insufficiency, stage II (mild), Chronic rhinitis, Cutaneous sarcoidosis, Hoarseness (2015), HTN (hypertension), Hydrocele, LPRD (laryngopharyngeal reflux disease), Obesity, Class II, BMI 35-39.9, OSA (obstructive sleep apnea) (07/28/2019), Peripheral edema, Prediabetes (02/2017), Pulmonary arterial hypertension ([place]), Sarcoidosis of lung ([place]) (12/2016), and Vocal cord polyps (2015).   reports that he quit smoking about 24 years ago. His smoking use included cigarettes. He has a 0.45 pack-year smoking history. He has never used smokeless tobacco.  Past Surgical History:  Procedure  Laterality Date  . OLECRANON BURSECTOMY Right 2019  . Polysomnogram  07/28/2019   Severe OSA w/hypoxia, mild central sleep apnea, CPAP recommended (Dr. Fransico Him)  . RIGHT HEART CATH N/A 06/27/2019   Mild PAH. Procedure: RIGHT HEART CATH;  Surgeon: Jolaine Artist, MD;  Location: [facility];  Service: Cardiovascular;  Laterality: N/A;  . 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  . SKIN GRAFT  2010   on left index finger  . TRANSTHORACIC ECHOCARDIOGRAM  06/27/2019   NORMAL  . Vocal cord surgery  2016   [facility]  . WISDOM TOOTH EXTRACTION  2005    Allergies  Allergen Reactions  . Wasp Venom Swelling    Immunization History  Administered Date(s) Administered  . Influenza,inj,Quad PF,6+ Mos 02/21/2020  . Influenza-Unspecified 04/08/2019  . PFIZER SARS-COV-2 Vaccination 08/20/2019, 09/10/2019  . Pneumococcal Polysaccharide-23 08/01/2019  . Tdap 04/20/2018    Family History  Problem Relation Age of Onset  . Sarcoidosis Father        ?  Marland Kitchen Lactose intolerance Father   . Other Mother        colon issues-portion of colon removed  . Lactose intolerance Mother   . Cancer Paternal Grandfather        ?     Current Outpatient Medications:  .  amLODipine (NORVASC) 10 MG tablet, Take 1 tablet (10 mg total) by mouth daily., Disp: 90 tablet, Rfl: 3 .  Cetirizine HCl (ZYRTEC PO), Take 10 mg by mouth daily., Disp: , Rfl:  .  Clocortolone Pivalate (CLODERM) 0.1 % cream, Apply 1 application topically 2 (two) times daily as needed (Sarcoidosis/rash). , Disp: , Rfl:  .  hydrocortisone butyrate (LUCOID) 0.1 % CREA cream, Apply 1 application topically 2 (two) times daily., Disp: 60 g, Rfl: 6 .  hydroxychloroquine (  PLAQUENIL) 200 MG tablet, Take 400 mg by mouth daily., Disp: , Rfl:  .  losartan (COZAAR) 50 MG tablet, Take 1 tablet (50 mg total) by mouth daily., Disp: 90 tablet, Rfl: 3 .  metFORMIN (GLUCOPHAGE-XR) 500 MG 24 hr tablet, Take 1 tablet (500  mg total) by mouth daily with breakfast., Disp: 30 tablet, Rfl: 6 .  metoprolol succinate (TOPROL-XL) 25 MG 24 hr tablet, Take 1.5 tablets (37.5 mg total) by mouth daily., Disp: 45 tablet, Rfl: 3 .  pantoprazole (PROTONIX) 40 MG tablet, TAKE 1 TABLET BY MOUTH 30 TO 60 MINUTES BEFORE YOUR FIRST AND LAST MEALS OF THE DAY, Disp: 180 tablet, Rfl: 1 .  predniSONE (DELTASONE) 5 MG tablet, Take 5 mg by mouth daily with breakfast., Disp: , Rfl:  .  spironolactone (ALDACTONE) 25 MG tablet, TAKE 1 TABLET(25 MG) BY MOUTH DAILY, Disp: 90 tablet, Rfl: 0      Objective:   Vitals:   04/05/20 1618  BP: 130/80  Pulse: 78  Temp: 98.1 F (36.7 C)  TempSrc: Other (Comment)  SpO2: 97%  Weight: 299 lb (135.6 kg)  Height: _0  (1.88 m)    Estimated body mass index is 38.39 kg/m as calculated from the following:   Height as of this encounter: _1  (1.88 m).   Weight as of this encounter: 299 lb (135.6 kg).  _2 @  Filed Weights   04/05/20 1618  Weight: 299 lb (135.6 kg)     Physical Exam General: No distress. oibese Neuro: Alert and Oriented x 3. GCS 15. Speech normal Psych: Pleasant   Rest discussion only visit      Assessment:       ICD-10-CM   1. Sarcoidosis with skin and lung involvement   D86.9        Plan:     Patient Instructions  Sarcoidosis with skin and lung involvement   - glad you are better with prednisone daily since winter/spring 2021 but we need to restage - currently still on prednisome 51m per day -CT scan of the chest definitely shows improvement in clearance of the Legionella pneumonia but what I do not know is whether your sarcoidosis is currently fibrotic versus inflammatory versus improved over time since 2018  Plan  - for now continue prednisone 521mper day  -I have written to the radiologist to get a radiologic opinion on your CT scan -based on this we might do observation follow-up or just continue prednisone or add methotrexate to try to see  if he can improve your lung health -I do worry the persistent inflammation or scar tissue in your lungs because of sarcoid without any clearance and over decades can affect your heart   OSA (obstructive sleep apnea)  - some of your fatigue and excess sleepiness could be tied to sleep apnea  Plan  -Keep appointment with Dr. TrGolden Hurter Followup    -Await our call` with radiology opinion.  If he decided to methotrexate we will set up a telephone visit or a pharmacy consult to discuss methotrexate [you might need some lab test such as QuantiFERON gold before we do methotrexate]   (Level 04: Estb 30-39 min   visit type: on-site physical face to visit visit spent in total care time and counseling or/and coordination of care by this undersigned MD - Dr MuBrand MalesThis includes one or more of the following on this same day 04/05/2020: pre-charting, chart review, note writing, documentation discussion of test results, diagnostic or  treatment recommendations, prognosis, risks and benefits of management options, instructions, education, compliance or risk-factor reduction. It excludes time spent by the [facility] or office staff in the care of the patient . Actual time is 30 min)   SIGNATURE    Dr. Brand Males, M.D., F.C.C.P,  Pulmonary and Critical Care Medicine Staff Physician, [facility] Director - Interstitial Lung Disease  Program  Pulmonary [place] [facility] [place], [place], [address]  Pager: [phone], If no answer or between  15:00h - 7:00h: call [phone] Telephone: [phone]  5:06 PM 04/05/2020

## 2020-04-05 NOTE — Progress Notes (Signed)
Full PFT performed today.

## 2020-04-19 ENCOUNTER — Other Ambulatory Visit: Payer: Self-pay

## 2020-04-19 ENCOUNTER — Other Ambulatory Visit: Payer: Self-pay | Admitting: Physician Assistant

## 2020-04-19 DIAGNOSIS — M7989 Other specified soft tissue disorders: Secondary | ICD-10-CM

## 2020-04-26 NOTE — Progress Notes (Signed)
Virtual Visit via Telephone Note   This visit type was conducted due to national recommendations for restrictions regarding the COVID-19 Pandemic (e.g. social distancing) in an effort to limit this patient's exposure and mitigate transmission in our community.  Due to his co-morbid illnesses, this patient is at least at moderate risk for complications without adequate follow up.  This format is felt to be most appropriate for this patient at this time.  The patient did not have access to video technology/had technical difficulties with video requiring transitioning to audio format only (telephone).  All issues noted in this document were discussed and addressed.  No physical exam could be performed with this format.  Please refer to the patient's chart for his  consent to telehealth for [facility].    Date:  04/27/2020   ID:  Sean Sharp, DOB Dec 31, 1975, MRN [id number] The patient was identified using 2 identifiers.  Patient Location: Home Provider Location: Home Office  PCP:  Sean Sou, MD  Cardiologist:  Sean Bickers, MD Electrophysiologist:  None   Evaluation Performed:  Follow-Up Visit  Chief Complaint:  OSA  History of Present Illness:    Sean Sharp is a 44 y.o. male with a hx of CKD stage 2, HTN, Pulmonary HTN and Sarcoidosis who was referred for sleep study by Sean Sharp.  He tells me that he had a sleep study 3-4 years ago and he used a CPAP device but he could never get it adjusted and so he did not use it.  He underwent [place] sleep study showing severe OSA with an AHI of 84.6/hr and nocturnal hypoxemia with O2 sats as low as 83%.  9.8 min were spent with O2 sats < 88% c/w nocturnal hypoxemia.  Moderate to severe snoring was also noted.  He underwent CPAP titration to 18cm H2O.  Unfortunately his device never got ordered.   When he initially had a CPAP several years ago and says that something was not working.  The first night he used it he felt  great the next day and now he does not notice a difference in how he feels.   He tolerated the full face mask but felt the pressure is not adequate.  He now is here to try to get his device that he never got in March.   The patient does not have symptoms concerning for COVID-19 infection (fever, chills, cough, or new shortness of breath).    Past Medical History:  Diagnosis Date  . 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  Hemoglobin electrophoresis normal 2016.  Marland Kitchen Chronic renal insufficiency, stage II (mild)   . Chronic rhinitis   . Cutaneous sarcoidosis    Sean Sharp: as of 02/2019->plaquenil.  Cont plaquenil and slowly tapering prednisone as of 06/2019 Sharp f/u  . Hoarseness 2015   Sean Sharp 04/2014-normal laryngoscopy: prednisone taper and bid nexium recommended.  No signif help so pt got 2nd opinion at [facility]   . HTN (hypertension)   . Hydrocele    left  . LPRD (laryngopharyngeal reflux disease)   . Obesity, Class II, BMI 35-39.9   . OSA (obstructive sleep apnea) 07/28/2019   SEVERE OSA, mild central sleep apnea, CPAP trial 08/2019 (Sean Sharp)  . Peripheral edema    left leg>right leg  . Prediabetes 02/2017   A1c 6.3% (right when he was starting to take prednisone prn for treatment of his sarcoidosis).  HbA1c 08/2017=6.3%. A1c 01/2020 6.4%.  . Pulmonary arterial hypertension ([place])  Mild (R heart cath 06/27/19), echo 06/2019 normal.  Wt loss and CPAP recommended.  . 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 Melvyn Novas suspects it was smoldering since 2011.  Skin bx of cutaneous lesion confirmed dx of sarcoidosis 02/2017.  Pt improving fall 2018 with plaquenil and prednisone.  Pt self d/c'd plaquenil.  . Vocal cord polyps 2015   surgery Sean Sharp    Past Surgical History:  Procedure Laterality Date  . OLECRANON BURSECTOMY Right 2019  . Polysomnogram  07/28/2019   Severe OSA w/hypoxia, mild central sleep apnea, CPAP recommended (Sean Sharp)  . RIGHT HEART CATH N/A 06/27/2019    Mild PAH. Procedure: RIGHT HEART CATH;  Surgeon: Sean Artist, MD;  Location: [facility];  Service: Cardiovascular;  Laterality: N/A;  . 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  . SKIN GRAFT  2010   on left index finger  . TRANSTHORACIC ECHOCARDIOGRAM  06/27/2019   NORMAL  . Vocal cord surgery  2016   Sean Sharp  . WISDOM TOOTH EXTRACTION  2005     Current Meds  Medication Sig  . amLODipine (NORVASC) 10 MG tablet Take 1 tablet (10 mg total) by mouth daily.  . Cetirizine HCl (ZYRTEC PO) Take 10 mg by mouth daily.  . Clocortolone Pivalate (CLODERM) 0.1 % cream Apply 1 application topically 2 (two) times daily as needed (Sarcoidosis/rash).   . hydrocortisone butyrate (LUCOID) 0.1 % CREA cream Apply 1 application topically 2 (two) times daily.  . hydroxychloroquine (PLAQUENIL) 200 MG tablet Take 400 mg by mouth daily.  Marland Kitchen losartan (COZAAR) 50 MG tablet Take 1 tablet (50 mg total) by mouth daily.  . metFORMIN (GLUCOPHAGE-XR) 500 MG 24 hr tablet Take 1 tablet (500 mg total) by mouth daily with breakfast.  . metoprolol succinate (TOPROL-XL) 25 MG 24 hr tablet Take 1.5 tablets (37.5 mg total) by mouth daily.  . pantoprazole (PROTONIX) 40 MG tablet TAKE 1 TABLET BY MOUTH 30 TO 60 MINUTES BEFORE YOUR FIRST AND LAST MEALS OF THE DAY  . predniSONE (DELTASONE) 5 MG tablet Take 5 mg by mouth daily with breakfast.  . spironolactone (ALDACTONE) 25 MG tablet TAKE 1 TABLET(25 MG) BY MOUTH DAILY     Allergies:   Wasp venom   Social History   Tobacco Use  . Smoking status: Former Smoker    Packs/day: 0.30    Years: 1.50    Pack years: 0.45    Types: Cigarettes    Quit date: 06/10/1995    Years since quitting: 24.8  . Smokeless tobacco: Never Used  Substance Use Topics  . Alcohol use: Yes    Comment: occasional  . Drug use: No     Family Hx: The patient's family history includes Cancer in his paternal grandfather; Lactose intolerance in his father and  mother; Other in his mother; Sarcoidosis in his father.  ROS:   Please see the history of present illness.     All other systems reviewed and are negative.   Prior CV studies:   The following studies were reviewed today:  Home sleep study, CPAP titration, PAP compliance download  Labs/Other Tests and Data Reviewed:    EKG:  No ECG reviewed.  Recent Labs: 02/03/2020: ALT 17; BUN 17; Creatinine, Ser 1.30; Hemoglobin 14.7; Platelets 215.0; Potassium 4.6; Sodium 136; TSH 1.49   Recent Lipid Panel Sharp Results  Component Value Date/Time   CHOL 186 02/03/2020 09:27 AM  TRIG 79.0 02/03/2020 09:27 AM   HDL 35.10 (L) 02/03/2020 09:27 AM   CHOLHDL 5 02/03/2020 09:27 AM   LDLCALC 136 (H) 02/03/2020 09:27 AM    Wt Readings from Last 3 Encounters:  04/27/20 290 lb (131.5 kg)  04/05/20 299 lb (135.6 kg)  03/19/20 295 lb 8 oz (134 kg)     Risk Assessment/Calculations:      Objective:    Vital Signs:  Ht _0  (1.88 m)   Wt 290 lb (131.5 kg)   BMI 37.23 kg/m     ASSESSMENT & PLAN:    1.  OSA  -He has used CPAP 4 years ago and still has his device but says that he has not used it in over 2 years.  He tells me that he felt he was not benefiting from it like he had initially -there currently is a back order on CPAP devices so I have told Sharp that I will set up an appt with [facility] to adjust his pressure settings.  He titrated to 18cm on his CPAP titration in March 2021 but he may do better on auto settings so I will set Sharp at auto CPAP from 6-18cm H2O.  I will get a download in 3 weeks. -I will also place an order for a new PAP device since his is over 44 years old but it will likely take a few months to get.   -I will see Sharp back 8 weeks after he gets his new device  2.  HTN -BP controlled -continue amlodipine 27m daily, Losaratn 585mdaily, spiro 2560maily and Toprol 37.5mg10mily   3.  Morbid Obesity -I have encouraged Sharp to get into a routine exercise program and cut  back on carbs and portions.    Shared Decision Making/Informed Consent        COVID-19 Education: The signs and symptoms of COVID-19 were discussed with the patient and how to seek care for testing (follow up with PCP or arrange E-visit).  The importance of social distancing was discussed today.  Time:   Today, I have spent 20 minutes with the patient with telehealth technology discussing the above problems.     Medication Adjustments/Labs and Tests Ordered: Current medicines are reviewed at length with the patient today.  Concerns regarding medicines are outlined above.   Tests Ordered: No orders of the defined types were placed in this encounter.   Medication Changes: No orders of the defined types were placed in this encounter.   Follow Up:   in 1 year(s)  Signed, TracFransico Sharp  04/27/2020 8:31 AM    [facility]

## 2020-04-27 ENCOUNTER — Telehealth: Payer: Self-pay | Admitting: *Deleted

## 2020-04-27 ENCOUNTER — Other Ambulatory Visit: Payer: Self-pay

## 2020-04-27 ENCOUNTER — Encounter: Payer: Self-pay | Admitting: Cardiology

## 2020-04-27 ENCOUNTER — Telehealth (INDEPENDENT_AMBULATORY_CARE_PROVIDER_SITE_OTHER): Payer: BC Managed Care – PPO | Admitting: Cardiology

## 2020-04-27 VITALS — Ht 74.0 in | Wt 290.0 lb

## 2020-04-27 DIAGNOSIS — I10 Essential (primary) hypertension: Secondary | ICD-10-CM

## 2020-04-27 DIAGNOSIS — G4733 Obstructive sleep apnea (adult) (pediatric): Secondary | ICD-10-CM

## 2020-04-27 NOTE — Telephone Encounter (Signed)
-----  Message from Sueanne Margarita, MD sent at 04/27/2020  8:31 AM EST ----- Please have patient take his device to [facility] and change settings to auto CPAP from 6-20cm H2O for now and get a download in 3 weeks.   Also order a new ResMed CPAP on auto from 6-20cm H2O with heated humidity and mask of choice and followup with me 8 weeks after he gets his new device

## 2020-04-27 NOTE — Telephone Encounter (Addendum)
Orders placed to [facility] via community message. Patients old machine is in collections with [facility] and they state that machine is inactive and they will not reactivate it.  change settings to auto CPAP from 6-20cm H2O for now and get a download in 3 weeks.   Patient says he has not used his cpap in 2 years and to forget trying to change the settings and just order the new unit.

## 2020-04-27 NOTE — Addendum Note (Signed)
Addended by: Freada Bergeron on: 04/27/2020 10:55 AM   Modules accepted: Orders

## 2020-05-21 ENCOUNTER — Telehealth: Payer: Self-pay | Admitting: Internal Medicine

## 2020-05-21 DIAGNOSIS — D869 Sarcoidosis, unspecified: Secondary | ICD-10-CM

## 2020-05-21 NOTE — Telephone Encounter (Signed)
Dr. Chase Caller can you address patient questions regarding his CT scan?

## 2020-05-22 NOTE — Telephone Encounter (Signed)
Please apologize to the patient on my behalf.  The issue is that the radiologist did not get back to me and reviewing the emails that I sent to radiology was on my list of things to do but had not gotten around to it as yet.  Plan -Please let patient know that have sent another reminder email.  If I do not hear from the radiologist soon then I will try to get hold of another radiologist.  -Please send this message back to me so I can track

## 2020-05-22 NOTE — Telephone Encounter (Signed)
ATC patient to let him know what Dr. Chase Caller said. Per DPR left detailed message. Advised patient to call with any questions or concerns. Will route back to Dr. Chase Caller for follow up.

## 2020-05-25 NOTE — Telephone Encounter (Signed)
  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 Brand Males, M.D., F.C.C.P,  Pulmonary and Critical Care Medicine Staff Physician, [place] Director - Interstitial Lung Disease  Program  P[facility] [place] [facility] [place], [place], [address]  Pager: [phone], If no answer  OR between  19:00-7:00h: page [phone] Telephone (clinical office): [phone] Telephone (research): [phone]  5:10 PM 05/25/2020       Hi Mikalah Skyles,  I believe the extensive pulmonary parenchymal findings of sarcoidosis in this patient are stable since 07/16/2019 chest CT as I described. Compared to 03/16/19 chest CT, however, there is mild worsening of the right perihilar fibrosis with mildly increased right perihilar bullous change and mildly increased right perihilar traction bronchiectasis. Otherwise stable exam since 03/16/19. Hope that helps. Best,  Corene Cornea

## 2020-05-28 NOTE — Telephone Encounter (Signed)
Called and spoke with pt letting him know the info stated by MR and also stated that he wants [place] to get him scheduled Feb/March 2022 to have PFT and OV after to decide if methotrexate needs to be started or not. Pt verbalized understanding. appts have been scheduled for pt. Nothing further needed.

## 2020-06-13 ENCOUNTER — Encounter: Payer: Self-pay | Admitting: *Deleted

## 2020-06-13 NOTE — Telephone Encounter (Signed)
  This encounter was created in error - please disregard.

## 2020-07-13 ENCOUNTER — Encounter: Payer: Self-pay | Admitting: Family Medicine

## 2020-07-20 ENCOUNTER — Other Ambulatory Visit (HOSPITAL_COMMUNITY): Payer: Self-pay

## 2020-07-20 MED ORDER — SPIRONOLACTONE 25 MG PO TABS: 25.0000 mg | ORAL_TABLET | Freq: Once | ORAL | 0 refills | Status: DC

## 2020-07-24 ENCOUNTER — Ambulatory Visit: Payer: BC Managed Care – PPO

## 2020-07-24 ENCOUNTER — Other Ambulatory Visit: Payer: Self-pay

## 2020-07-24 ENCOUNTER — Ambulatory Visit: Payer: BC Managed Care – PPO | Admitting: Internal Medicine

## 2020-07-31 ENCOUNTER — Other Ambulatory Visit: Payer: Self-pay

## 2020-08-03 ENCOUNTER — Other Ambulatory Visit: Payer: Self-pay

## 2020-08-03 ENCOUNTER — Ambulatory Visit: Payer: Self-pay | Admitting: Family Medicine

## 2020-08-03 DIAGNOSIS — Z0289 Encounter for other administrative examinations: Secondary | ICD-10-CM

## 2020-08-03 NOTE — Progress Notes (Deleted)
OFFICE VISIT  08/03/2020  CC: No chief complaint on file.  HPI:    Patient is a 45 y.o. African-American male who presents for 6 mo f/u HTN and prediabetes. A/P as of last visit: "1) HTN: partial med noncompliance. Get back on amlodipine 74m qd, restart toprol xl 37.593mqd that he ran out of recently.  Continue aldactone 2552md. Lytes/cr today.  2) Prediabetes: unfortunately his diet is usually not good, not doing any formal exercise, and he is still on low dose prednisone chronically.  BMET and Hba1c today.  3) Health maintenance exam: Reviewed age and gender appropriate health maintenance issues (prudent diet, regular exercise, health risks of tobacco and excessive alcohol, use of seatbelts, fire alarms in home, use of sunscreen).  Also reviewed age and gender appropriate health screening as well as vaccine recommendations. Vaccines: Tdap and pneumovax UTD.  Covid 19 -->UTD.   Labs: fasting HP + A1c ordered. Prostate ca screening: average risk patient= as per latest guidelines, start screening at 50 2s of age. Colon ca screening: average risk patient= as per latest guidelines, start screening at 45 29s of age."  INTERIM HX: ***  HTN: Dr. BenHaroldine Lawsded losartan 10m68m to his regimen when he saw him for f/u of his pulm art htn 4 mo ago.   Past Medical History:  Diagnosis Date  . 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  Hemoglobin electrophoresis normal 2016.  . ChMarland Kitchenonic renal insufficiency, stage II (mild)   . Chronic rhinitis   . Cutaneous sarcoidosis    [place] rheum: as of 02/2019->plaquenil.  Cont plaquenil and slowly tapering prednisone as of 06/2019 rheum f/u  . Hoarseness 2015   [place] 04/2014-normal laryngoscopy: prednisone taper and bid nexium recommended.  No signif help so [facility]   . HTN (hypertension)   . Hydrocele    left  . LPRD (laryngopharyngeal reflux disease)   . Obesity, Class II, BMI 35-39.9   . OSA (obstructive sleep apnea)  07/28/2019   SEVERE OSA, mild central sleep apnea, CPAP trial 08/2019 (Dr. TracFransico Him Peripheral edema    left leg>right leg  . Prediabetes 02/2017   A1c 6.3% (right when he was starting to take prednisone prn for treatment of his sarcoidosis).  HbA1c 08/2017=6.3%. A1c 01/2020 6.4%.  . Pulmonary arterial hypertension ([facility])    Mild (R heart cath 06/27/19), echo 06/2019 normal.  Wt loss and CPAP recommended.  . Sarcoidosis of lung ([facility])[place]ine/2018   2018, but Dr. WertMelvyn Novaspects it was smoldering since 2011.  Skin bx of cutaneous lesion confirmed dx of sarcoidosis 02/2017.  Pt improving fall 2018 with plaquenil and prednisone.  Pt self d/c'd plaquenil.  . Vocal cord polyps 2015   surgery [facility]     Past Surgical History:  Procedure Laterality Date  . OLECRANON BURSECTOMY Right 2019  . Polysomnogram  07/28/2019   Severe OSA w/hypoxia, mild central sleep apnea, CPAP recommended (Dr. TracFransico Him RIGHT HEART CATH N/A 06/27/2019   Mild PAH. Procedure: RIGHT HEART CATH;  Surgeon: BensJolaine Artist;  [facility];  Service: Cardiovascular;  Laterality: N/A;  . 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  . SKIN GRAFT  2010   on left index finger  . TRANSTHORACIC ECHOCARDIOGRAM  06/22/2019   NORMAL  . Vocal cord surgery  2016   [facility]  . [place]  2005  Outpatient Medications Prior to Visit  Medication Sig Dispense Refill  . amLODipine (NORVASC) 10 MG tablet Take 1 tablet (10 mg total) by mouth daily. 90 tablet 3  . Cetirizine HCl (ZYRTEC PO) Take 10 mg by mouth daily.    . Clocortolone Pivalate (CLODERM) 0.1 % cream Apply 1 application topically 2 (two) times daily as needed (Sarcoidosis/rash).     . hydrocortisone butyrate (LUCOID) 0.1 % CREA cream Apply 1 application topically 2 (two) times daily. 60 g 6  . hydroxychloroquine (PLAQUENIL) 200 MG tablet Take 400 mg by mouth daily.    Marland Kitchen losartan (COZAAR) 50 MG tablet Take 1  tablet (50 mg total) by mouth daily. 90 tablet 3  . metFORMIN (GLUCOPHAGE-XR) 500 MG 24 hr tablet Take 1 tablet (500 mg total) by mouth daily with breakfast. 30 tablet 6  . metoprolol succinate (TOPROL-XL) 25 MG 24 hr tablet Take 1.5 tablets (37.5 mg total) by mouth daily. 45 tablet 3  . pantoprazole (PROTONIX) 40 MG tablet TAKE 1 TABLET BY MOUTH 30 TO 60 MINUTES BEFORE YOUR FIRST AND LAST MEALS OF THE DAY 180 tablet 1  . predniSONE (DELTASONE) 5 MG tablet Take 5 mg by mouth daily with breakfast.    . spironolactone (ALDACTONE) 25 MG tablet Take 1 tablet (25 mg total) by mouth once for 1 dose. 90 tablet 0   No facility-administered medications prior to visit.    Allergies  Allergen Reactions  . Wasp Venom Swelling    ROS As per HPI  PE: Vitals with BMI 04/27/2020 04/05/2020 03/19/2020  Height _0  _1  -  Weight 290 lbs 299 lbs 295 lbs 8 oz  BMI 12.45 80.99 -  Systolic - 833 825  Diastolic - 80 98  Pulse - 78 83     ***  LABS:  Lab Results  Component Value Date   TSH 1.49 02/03/2020   Lab Results  Component Value Date   WBC 6.8 02/03/2020   HGB 14.7 02/03/2020   HCT 46.5 02/03/2020   MCV 70.3 (L) 02/03/2020   PLT 215.0 02/03/2020   Lab Results  Component Value Date   IRON 58 11/03/2014   TIBC 270 11/03/2014   FERRITIN 466 (H) 07/16/2019   Lab Results  Component Value Date   CREATININE 1.30 02/03/2020   BUN 17 02/03/2020   NA 136 02/03/2020   K 4.6 02/03/2020   CL 101 02/03/2020   CO2 24 02/03/2020   Lab Results  Component Value Date   ALT 17 02/03/2020   AST 19 02/03/2020   ALKPHOS 55 02/03/2020   BILITOT 0.8 02/03/2020   Lab Results  Component Value Date   CHOL 186 02/03/2020   Lab Results  Component Value Date   HDL 35.10 (L) 02/03/2020   Lab Results  Component Value Date   LDLCALC 136 (H) 02/03/2020   Lab Results  Component Value Date   TRIG 79.0 02/03/2020   Lab Results  Component Value Date   CHOLHDL 5 02/03/2020   Lab  Results  Component Value Date   PSA 0.79 08/25/2014   Lab Results  Component Value Date   HGBA1C 6.4 02/03/2020   IMPRESSION AND PLAN:  No problem-specific Assessment & Plan notes found for this encounter.  Cmet,flp,a1c  An After Visit Summary was printed and given to the patient.  FOLLOW UP: No follow-ups on file. Next cpe 6 mo  Signed:  Crissie Sickles, MD           08/03/2020

## 2020-08-20 ENCOUNTER — Other Ambulatory Visit: Payer: Self-pay

## 2020-08-21 ENCOUNTER — Encounter: Payer: Self-pay | Admitting: Family Medicine

## 2020-08-21 ENCOUNTER — Ambulatory Visit: Payer: BC Managed Care – PPO | Admitting: Family Medicine

## 2020-08-21 VITALS — BP 130/78 | HR 77 | Temp 97.9°F | Resp 16 | Ht 74.0 in | Wt 300.6 lb

## 2020-08-21 DIAGNOSIS — I10 Essential (primary) hypertension: Secondary | ICD-10-CM | POA: Diagnosis not present

## 2020-08-21 DIAGNOSIS — R7303 Prediabetes: Secondary | ICD-10-CM

## 2020-08-21 DIAGNOSIS — Z7952 Long term (current) use of systemic steroids: Secondary | ICD-10-CM | POA: Diagnosis not present

## 2020-08-21 LAB — HEMOGLOBIN A1C: Hgb A1c MFr Bld: 6 % (ref 4.6–6.5)

## 2020-08-21 LAB — COMPREHENSIVE METABOLIC PANEL
ALT: 17 U/L (ref 0–53)
AST: 17 U/L (ref 0–37)
Albumin: 3.9 g/dL (ref 3.5–5.2)
Alkaline Phosphatase: 56 U/L (ref 39–117)
BUN: 16 mg/dL (ref 6–23)
CO2: 29 mEq/L (ref 19–32)
Calcium: 9.6 mg/dL (ref 8.4–10.5)
Chloride: 101 mEq/L (ref 96–112)
Creatinine, Ser: 1.25 mg/dL (ref 0.40–1.50)
GFR: 69.78 mL/min (ref >60.00)
Glucose, Bld: 105 mg/dL — ABNORMAL HIGH (ref 70–99)
Potassium: 4 mEq/L (ref 3.5–5.1)
Sodium: 136 mEq/L (ref 135–145)
Total Bilirubin: 0.5 mg/dL (ref 0.2–1.2)
Total Protein: 7.2 g/dL (ref 6.0–8.3)

## 2020-08-21 LAB — LIPID PANEL
Cholesterol: 157 mg/dL (ref 0–200)
HDL: 31.5 mg/dL — ABNORMAL LOW (ref >39.00)
LDL Cholesterol: 105 mg/dL — ABNORMAL HIGH (ref 0–99)
NonHDL: 125.19
Total CHOL/HDL Ratio: 5
Triglycerides: 100 mg/dL (ref 0.0–149.0)
VLDL: 20 mg/dL (ref 0.0–40.0)

## 2020-08-21 MED ORDER — METFORMIN HCL ER 500 MG PO TB24
500.0000 mg | ORAL_TABLET | Freq: Every day | ORAL | 6 refills | Status: DC
Start: 2020-08-21 — End: 2021-06-13

## 2020-08-21 NOTE — Telephone Encounter (Signed)
Patient has a 10 week follow up appointment scheduled for 09/13/20  Patient understands he needs to keep this appointment for insurance compliance. Patient was grateful for the call and thanked me.

## 2020-08-21 NOTE — Progress Notes (Signed)
OFFICE VISIT  08/21/2020  CC:  Chief Complaint  Sean Sharp presents with  . Follow-up    [name], 6 mo. Pt is not fasting.     HPI:    Sean Sharp is a 45 y.o. African-American male who presents for 6 mo f/u HTN and prediabetes.  He has pulm and cutaneous sarcoidosis and is on chronic prednisone treatment for this.  Also has OSA and secondary pulm HTN. He is followed by pulm, derm, and cardiology.  A/P as of last visit: "1) HTN: partial med noncompliance. Get back on amlodipine 48m qd, restart toprol xl 37.516mqd that he ran out of recently.  Continue aldactone 2554md. Lytes/cr today.  2) Prediabetes: unfortunately his diet is usually not good, not doing any formal exercise, and he is still on low dose prednisone chronically.  BMET and Hba1c today.  3) Health maintenance exam: Reviewed age and gender appropriate health maintenance issues (prudent diet, regular exercise, health risks of tobacco and excessive alcohol, use of seatbelts, fire alarms in home, use of sunscreen).  Also reviewed age and gender appropriate health screening as well as vaccine recommendations. Vaccines: Tdap and pneumovax UTD.  Covid 19 -->UTD.   Labs: fasting HP + A1c ordered. Prostate ca screening: average risk Sean Sharp= as per latest guidelines, start screening at 50 48s of age. Colon ca screening: average risk Sean Sharp= as per latest guidelines, start screening at 45 43s of age."  INTERIM HX: Feeling well lately.   Last visit his a1c was up to 6.4% and I started him on metformin ER 500 mg qd. Took it 1 mo and did not request RF.  Tolerated it fine, though.  HTN: taking amlod 74m64m, losartan 50mg54m toprol xl 37.5mg q73mand aldactone 25mg q41mo recent home bp monitoring.  Says he doesn't take the prednisone consistently----uses it prn when pulm sx's flare. Says use of CBD oil has helped his breathing a lot: no longer signif sob after climbing stairs.  He is still gradually working harder on some  TLCs.  ROS as above, plus--> no fevers, no CP, no SOB, no wheezing, no cough, no dizziness, no HAs, no rashes, no melena/hematochezia.  No polyuria or polydipsia.  No myalgias or arthralgias.  No focal weakness, paresthesias, or tremors.  No acute vision or hearing abnormalities.  No dysuria or unusual/new urinary urgency or frequency.  No recent changes in lower legs. No n/v/d or abd pain.  No palpitations.     Past Medical History:  Diagnosis Date  . 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  Hemoglobin electrophoresis normal 2016.  . ChronMarland Kitchenc renal insufficiency, stage II (mild)   . Chronic rhinitis   . Cutaneous sarcoidosis    [place] rheum: as of 02/2019->plaquenil.  Cont plaquenil and slowly tapering prednisone as of 06/2019 rheum f/u  . Hoarseness 2015   [place] [facility]: prednisone taper and bid nexium recommended.  No signif help so [facility]. HTN (hypertension)   . Hydrocele    left  . LPRD (laryngopharyngeal reflux disease)   . Obesity, Class II, BMI 35-39.9   . OSA (obstructive sleep apnea) 07/28/2019   SEVERE OSA, mild central sleep apnea, CPAP trial 08/2019 (Dr. Traci TFransico Himripheral edema    left leg>right leg  . Prediabetes 02/2017   A1c 6.3% (right when he was starting to take prednisone prn for treatment of his sarcoidosis).  HbA1c 08/2017=6.3%. A1c 01/2020 6.4%.  . Pulmonary  arterial hypertension (HCC)    Mild (R heart cath 06/27/19), echo 06/2019 normal.  Wt loss and CPAP recommended.  . 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 Melvyn Novas suspects it was smoldering since 2011.  Skin bx of cutaneous lesion confirmed dx of sarcoidosis 02/2017.  Pt improving fall 2018 with plaquenil and prednisone.  Pt self d/c'd plaquenil.  . Vocal cord polyps 2015   surgery [facility]     Past Surgical History:  Procedure Laterality Date  . OLECRANON BURSECTOMY Right 2019  . Polysomnogram  07/28/2019   Severe OSA w/hypoxia, mild central sleep  apnea, CPAP recommended (Dr. Fransico Him)  . RIGHT HEART CATH N/A 06/27/2019   Mild PAH. Procedure: RIGHT HEART CATH;  Surgeon: Jolaine Artist, MD;  Location: [facility];  Service: Cardiovascular;  Laterality: N/A;  . 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  . SKIN GRAFT  2010   on left index finger  . TRANSTHORACIC ECHOCARDIOGRAM  06/22/2019   NORMAL  . Vocal cord surgery  2016   [facility]  . [place] EXTRACTION  2005    Outpatient Medications Prior to Visit  Medication Sig Dispense Refill  . amLODipine (NORVASC) 10 MG tablet Take 1 tablet (10 mg total) by mouth daily. 90 tablet 3  . Clocortolone Pivalate (CLODERM) 0.1 % cream Apply 1 application topically 2 (two) times daily as needed (Sarcoidosis/rash).     . folic acid (FOLVITE) 1 MG tablet Take 1 mg by mouth daily.    . hydroxychloroquine (PLAQUENIL) 200 MG tablet Take 400 mg by mouth daily.    Marland Kitchen levocetirizine (XYZAL) 5 MG tablet SMARTSIG:1 Tablet(s) By Mouth Every Evening    . losartan (COZAAR) 50 MG tablet Take 1 tablet (50 mg total) by mouth daily. 90 tablet 3  . methotrexate (RHEUMATREX) 2.5 MG tablet Take 15 mg by mouth once a week.    . metoprolol succinate (TOPROL-XL) 25 MG 24 hr tablet Take 1.5 tablets (37.5 mg total) by mouth daily. 45 tablet 3  . pantoprazole (PROTONIX) 40 MG tablet TAKE 1 TABLET BY MOUTH 30 TO 60 MINUTES BEFORE YOUR FIRST AND LAST MEALS OF THE DAY 180 tablet 1  . spironolactone (ALDACTONE) 25 MG tablet Take 1 tablet (25 mg total) by mouth once for 1 dose. 90 tablet 0  . metFORMIN (GLUCOPHAGE-XR) 500 MG 24 hr tablet Take 1 tablet (500 mg total) by mouth daily with breakfast. 30 tablet 6  . hydrocortisone butyrate (LUCOID) 0.1 % CREA cream Apply 1 application topically 2 (two) times daily. (Sean Sharp not taking: Reported on 08/21/2020) 60 g 6  . predniSONE (DELTASONE) 5 MG tablet Take 5 mg by mouth daily with breakfast. (Sean Sharp not taking: Reported on 08/21/2020)     . Cetirizine HCl (ZYRTEC PO) Take 10 mg by mouth daily.     No facility-administered medications prior to visit.    Allergies  Allergen Reactions  . Wasp Venom Swelling    ROS As per HPI  PE: Vitals with BMI 08/21/2020 04/27/2020 04/05/2020  Height _0  _1  _2   Weight 300 lbs 10 oz 290 lbs 299 lbs  BMI 38.58 34.28 76.81  Systolic 157 - 262  Diastolic 78 - 80  Pulse 77 - 78     Gen: Alert, well appearing.  Sean Sharp is oriented to person, place, time, and situation. AFFECT: pleasant, lucid thought and speech. CV: RRR, no m/r/g.   LUNGS: CTA bilat, nonlabored  resps, good aeration in all lung fields. EXT: no clubbing or cyanosis.  2+ R LL pitting and 1+ L LL pitting edema.    LABS:  Lab Results  Component Value Date   TSH 1.49 02/03/2020   Lab Results  Component Value Date   WBC 6.8 02/03/2020   HGB 14.7 02/03/2020   HCT 46.5 02/03/2020   MCV 70.3 (L) 02/03/2020   PLT 215.0 02/03/2020   Lab Results  Component Value Date   CREATININE 1.30 02/03/2020   BUN 17 02/03/2020   NA 136 02/03/2020   K 4.6 02/03/2020   CL 101 02/03/2020   CO2 24 02/03/2020   Lab Results  Component Value Date   ALT 17 02/03/2020   AST 19 02/03/2020   ALKPHOS 55 02/03/2020   BILITOT 0.8 02/03/2020   Lab Results  Component Value Date   CHOL 186 02/03/2020   Lab Results  Component Value Date   HDL 35.10 (L) 02/03/2020   Lab Results  Component Value Date   LDLCALC 136 (H) 02/03/2020   Lab Results  Component Value Date   TRIG 79.0 02/03/2020   Lab Results  Component Value Date   CHOLHDL 5 02/03/2020   Lab Results  Component Value Date   PSA 0.79 08/25/2014   Lab Results  Component Value Date   HGBA1C 6.4 02/03/2020   IMPRESSION AND PLAN:  1) Prediabetes: took metformin only 1 mo->?pharmacy error on RFs?? Still working on [employer]. Plan restart metformin ER 500 mg qd, recheck Hba1c today as well as check flp and cmet.  2) HTN: well controlled on current  regimen (see HPI)--continue. Lytes/cr today.  3) Sarcoidosis: pulm aspect seems quiescent at this time. He is on methotrexate q week and also takes prednisone on more of a prn basis in bursts. He'll be following with derm as usual.  Spent 30 min with pt today reviewing HPI, reviewing relevant past history, doing exam, reviewing and discussing lab and imaging data, and formulating plans.  An After Visit Summary was printed and given to the Sean Sharp.  FOLLOW UP: Return in about 3 months (around 11/21/2020) for routine chronic illness f/u.  Signed:  Crissie Sickles, MD           08/21/2020

## 2020-09-03 ENCOUNTER — Other Ambulatory Visit (HOSPITAL_COMMUNITY)
Admission: RE | Admit: 2020-09-03 | Discharge: 2020-09-03 | Disposition: A | Discharge status: Home / Self Care | Payer: BC Managed Care – PPO | Source: Ambulatory Visit | Attending: Internal Medicine | Admitting: Internal Medicine

## 2020-09-03 DIAGNOSIS — Z01812 Encounter for preprocedural laboratory examination: Secondary | ICD-10-CM | POA: Insufficient documentation

## 2020-09-03 DIAGNOSIS — Z20822 Contact with and (suspected) exposure to covid-19: Secondary | ICD-10-CM | POA: Diagnosis not present

## 2020-09-03 LAB — SARS CORONAVIRUS 2 (TAT 6-24 HRS): SARS Coronavirus 2: NEGATIVE

## 2020-09-06 ENCOUNTER — Ambulatory Visit: Payer: BC Managed Care – PPO | Admitting: Internal Medicine

## 2020-09-06 ENCOUNTER — Ambulatory Visit (INDEPENDENT_AMBULATORY_CARE_PROVIDER_SITE_OTHER): Payer: BC Managed Care – PPO | Admitting: Internal Medicine

## 2020-09-06 ENCOUNTER — Other Ambulatory Visit: Payer: Self-pay

## 2020-09-06 ENCOUNTER — Other Ambulatory Visit: Payer: Self-pay | Admitting: Internal Medicine

## 2020-09-06 ENCOUNTER — Encounter: Payer: Self-pay | Admitting: Internal Medicine

## 2020-09-06 VITALS — BP 128/80 | HR 100 | Temp 97.9°F | Ht 76.0 in | Wt 299.8 lb

## 2020-09-06 DIAGNOSIS — D869 Sarcoidosis, unspecified: Secondary | ICD-10-CM | POA: Diagnosis not present

## 2020-09-06 LAB — PULMONARY FUNCTION TEST
DL/VA % pred: 140 %
DL/VA % pred: 140 %
DL/VA: 6.25 ml/min/mmHg/L
DL/VA: 6.25 ml/min/mmHg/L
DLCO cor % pred: 72 %
DLCO cor % pred: 72 %
DLCO cor: 24.55 ml/min/mmHg
DLCO cor: 24.55 ml/min/mmHg
DLCO unc % pred: 72 %
DLCO unc % pred: 72 %
DLCO unc: 24.55 ml/min/mmHg
DLCO unc: 24.55 ml/min/mmHg
FEF 25-75 Post: 2.37 L/sec
FEF 25-75 Post: 2.37 L/sec
FEF 25-75 Pre: 1.19 L/sec
FEF 25-75 Pre: 1.19 L/sec
FEF2575-%Change-Post: 98 %
FEF2575-%Change-Post: 98 %
FEF2575-%Pred-Post: 60 %
FEF2575-%Pred-Post: 60 %
FEF2575-%Pred-Pre: 30 %
FEF2575-%Pred-Pre: 30 %
FEV1-%Change-Post: 16 %
FEV1-%Change-Post: 16 %
FEV1-%Pred-Post: 62 %
FEV1-%Pred-Post: 62 %
FEV1-%Pred-Pre: 53 %
FEV1-%Pred-Pre: 53 %
FEV1-Post: 2.48 L
FEV1-Post: 2.48 L
FEV1-Pre: 2.13 L
FEV1-Pre: 2.13 L
FEV1FVC-%Change-Post: 7 %
FEV1FVC-%Change-Post: 7 %
FEV1FVC-%Pred-Pre: 86 %
FEV1FVC-%Pred-Pre: 86 %
FEV6-%Change-Post: 7 %
FEV6-%Change-Post: 7 %
FEV6-%Pred-Post: 67 %
FEV6-%Pred-Post: 67 %
FEV6-%Pred-Pre: 62 %
FEV6-%Pred-Pre: 62 %
FEV6-Post: 3.24 L
FEV6-Post: 3.24 L
FEV6-Pre: 3.02 L
FEV6-Pre: 3.02 L
FEV6FVC-%Change-Post: 0 %
FEV6FVC-%Change-Post: 0 %
FEV6FVC-%Pred-Post: 101 %
FEV6FVC-%Pred-Post: 101 %
FEV6FVC-%Pred-Pre: 101 %
FEV6FVC-%Pred-Pre: 101 %
FVC-%Change-Post: 8 %
FVC-%Change-Post: 8 %
FVC-%Pred-Post: 66 %
FVC-%Pred-Post: 66 %
FVC-%Pred-Pre: 61 %
FVC-%Pred-Pre: 61 %
FVC-Post: 3.29 L
FVC-Post: 3.29 L
FVC-Pre: 3.04 L
FVC-Pre: 3.04 L
Post FEV1/FVC ratio: 75 %
Post FEV1/FVC ratio: 75 %
Post FEV6/FVC ratio: 99 %
Post FEV6/FVC ratio: 99 %
Pre FEV1/FVC ratio: 70 %
Pre FEV1/FVC ratio: 70 %
Pre FEV6/FVC Ratio: 100 %
Pre FEV6/FVC Ratio: 100 %
RV % pred: 73 %
RV % pred: 73 %
RV: 1.58 L
RV: 1.58 L
TLC % pred: 60 %
TLC % pred: 60 %
TLC: 4.72 L
TLC: 4.72 L

## 2020-09-06 NOTE — Progress Notes (Signed)
PFT done today. 

## 2020-09-06 NOTE — Progress Notes (Signed)
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   1997 s sequelae 2006  With exp to foam bad cough resolved w/in 3 days of avoidance and did fine until 2011 really bad cold > chronic cough eval by Dr Joya Gaskins 04/2010 with suspicion for sarcoid (pos in father) but CT with only nonspecific adenopathy  waxes and wanes since then with extensive w/u / rx at [facility] voice center (reviewed in care everywhere)  And much  worse x 09/2016 assoc with hb/ worse with certain foods and some better dymista self referred to pulmonary clinic 12/11/2016    History of Present Illness  12/11/2016 1st [facility] office visit/ [facility]   Chief Complaint  [name], and has "passed out" once before due to cough.    cough x 6 years  Citric drinks make it worse  Allergy eval [place] cedar allergy  nexium as walking the door     Kouffman Reflux v Neurogenic Cough Differentiator Reflux Comments  Do you awaken from a sound sleep coughing violently?                            With trouble breathing? No   Do you have choking episodes when you cannot  Get enough air, gasping for air ?              Yes   Do you usually cough when you lie down into  The bed, or when you just lie down to rest ?                          Sometimes depending on what eaten   Do you usually cough after meals or eating?         Yes   Do you cough when (or after) you bend over?    Yes if full   GERD SCORE     Kouffman Reflux v Neurogenic Cough Differentiator Neurogenic   Do you more-or-less cough all day long? Sporadically    Does change of temperature make you cough? Not much   Does laughing or chuckling cause you to cough? yes   Do fumes (perfume, automobile fumes, burned  Toast, etc.,) cause  you to cough ?      Can, esp cooking on grill   Does speaking, singing, or talking on the phone cause you to cough   ?               No    Neurogenic/Airway score      rec Protonix (pantoprazole) 40 mg  Take 30- 60 min before your first and last meals of the day  Continue dymista one twice daily - point toward ear on same side For drainage / throat tickle as need  >>>   take CHLORPHENIRAMINE  4 mg - take one every 4 hours as needed -  For cough >>>   tessilon 200 mg three to four times daily  GERD (REFLUX)   Please remember to go to the x-ray department downstairs in the basement  for your tests - we will call you with the results when they are available. If  not better at 6 weeks you need to return to [facility] - if better then ok to refill meds thru your PCP or return here at 3 months to regroup re need for longterm treatment options - late add:  ? pna in rll/ ? Sarcoid changes also rec:  Return for esr/ cmet/ angiotensin, cbc with diff and rx Augmentin 875 mg take one pill twice daily  X 10 daysAnd  Prednisone 10 mg take  4 each am x 2 days,   2 each am x 2 days,  1 each am x 2 days and stop  And f/u with cxr in 2 weeks =  No change 12/29/16  - Angiotensin  12/18/16   138 with esr 44  - CT 01/05/2017   Extensive partially calcified mediastinal and bilateral hilar lymphadenopathy with extensive perilymphatic nodularity throughout the lungs bilaterally (right greater than left). This spectrum of imaging findings is compatible with the clinically suspected sarcoidosis.       01/06/2017  f/u ov/Wert re: probable sarcoid / ?uacs on gerd rx  Chief Complaint  Patient presents with  . Follow-up    Pt hwew to discuss CT scan, He still has occ. sob,he is still coughing which causes him some dizziness,   rash has evolved over several years worse area R neck attributed to reaction to otc cream but noted over other parts of face where no cream was used  Cough is better but still requires rewwq  tessilon All symptoms a lot better (x for the rash) while on short courses of prednisone New problem is painful swelling R elbow s trauma hx  rec Continue protonix and dymista as you are If cough or breathing worse, take prednisone 10 mg x 2 until better then 1 daily x one week then stop  We will call you with appt to see dermatology and orthopedics    - Skin bx 01/09/17 :   Pos granulomatous dermatitis > rec 01/21/2017 start plaquenil 200 mg daily    03/10/2017  f/u ov/Wert re:  Plaquenil x 01/21/17 @ 200 mg daily  Chief Complaint  Patient presents with  . Follow-up    Pt states he occ has a "strange sensation" in the right side of his chest. He states he feels tired most of the time, not much energy.    cough x onset 2011 then worse 09/2016 > resolved p prednisone and stayed gone p starting plaquenil as above Skin changes x 2016 also improving on plaquenil and steroid cream per derm  No need for prednisone since starting plaquenil  rec No change plaquenil dose for now  Please schedule a follow up office visit in 6 weeks, call sooner if needed with cxr on return  - consider taper off gerd rx if cough stays gone at next ov      02/25/2018  Pulmonary consultation: [facility] re: sarcoidosis on "prn prednisone/ did not maintain on plaquenil / needs clearance for R olecranon bursitis Chief Complaint  Patient presents with  . Advice Only    Needing pulm clearance for right elbow surgery. He has occ cough- non prod.     Not limited by breathing from desired activities   Min daytime dry cough attributes to pnds / some better on dymista when can afford it, does not recall resp to 1st gen H1 blockers per guidelines   No ocular complaints  Sleeps flat ok  rec If rash worse > return to dermatology  If nasal symptoms worse > ent  See your eye doctor yearly    televist 09/06/18 History of Present Illness: 09/06/2018  f/u ov/Wert re: sarcoidosis / skin involvement/rheumatology  rec plaqueninl 200  x 2 rec prednisone 5 mg  Per Ursula Alert x 20 mg x 4 days   Dyspnea:  Not limited by breathing from desired activities  / work out fine Cough: none on dymista /pantoprazole Sleeping: sleep flat  SABA use: none 02: none  rec Since your cough is better on dysmista and protonix and does not flare when you have perceive the sarcoid is getting worse it is unlikely related to your sarcoidosis so continue the dysmista/protonix  Ok to use protonix Take 30-60 min before first meal of the day but at onset of any cough you need to remember to Take 30- 60 min before your first and last meals of the day until cough is gone again I refilled your protonix today for the next 6 months but after that you will need to return here or see your PCP for this (as it's not really a pulmonary issue)  Take prednisone and plaquenil at your rheumatologists direction - the goal for plaquenil is to eventually wean you off of all prednisone.  Once the carona virus restrictions are listed we need to see you back here for PFT's/ ov so let's set this up for 6 months from now     12/08/2018  f/u ov/Wert re:  Sarcoidosis on plaquenil 200 x 2, no pred x months f/u by rheum Chief Complaint  Patient presents with  . Follow-up    No co's today   Dyspnea:  Good ex tol Cough: betterp dymista  Sleeping: ok SABA use: none  02: none    No obvious day to day or daytime variability or assoc excess/ purulent sputum or mucus plugs or hemoptysis or cp or chest tightness, subjective wheeze or overt sinus or hb symptoms.   Sleeping  without nocturnal  or early am exacerbation  of respiratory  c/o's or need for noct saba. Also denies any obvious fluctuation of symptoms with weather or environmental changes or other aggravating or alleviating factors except as outlined above   No unusual exposure hx or h/o childhood pna/ asthma or knowledge of premature birth.            OV 02/21/2020 -transfer of care from Dr. Christinia Gully to Dr.  Chase Caller.  History is provided by the patient and review of the chart.  Subjective:  Patient ID: Sean Sharp, male , DOB: July 12, 1975 , age 7 y.o. , MRN: [id number] , ADDRESS: [address] [place] [address]   02/21/2020 -   Chief Complaint  Patient presents with  . 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  Initially seen by Dr. Asencion Noble and then subsequently by Dr. Christinia Gully.  He says early on he was only treated with as needed prednisone with each course lasting approximately 1 month.  He does not remember being on chronic prednisone.  He has never been on immunomodulators.  He is always had a chronic cough which is a piece together was deemed as secondary to sinus issues also irritable larynx syndrome per review of the chart.  It appears a few years ago a chest x-ray was done and it showed significant pulmonary sarcoidosis.  At this point  in time he had a skin biopsy of chronic lesions on his face and this was in 2018 or so which showed features consistent with sarcoid.  After that somewhere along the way he got established with rheumatology.  He has been recommended to continue to see pulmonary.  Then in February 2021 he was admitted for septic emboli and cavitary pneumonia.  According to the discharge summary no specific diagnosis for the pneumonia is indicated but review of the labs indicate Legionella urine antigen positivity. Visualization of the CT scan shows significant worsening in consolidation particularly in the right upper lobe compared to October 2020.  After this somewhere along the way he went on chronic prednisone.  After going on chronic prednisone multiple symptoms such as excessive daytime somnolence and fatigue have improved although not fully resolved.  He is known to have sleep apnea and the results  of below although he is not seeing a sleep specialist and is not using his CPAP even though has been advised to use 1.  In the past has had cough syncope and also excessive daytime somnolence and fatigue.  At this point in time his overall health status is somewhat better but he still has some residual dyspnea.  He denies any features of collagen vascular disease  Recent evaluation shows mild pulmonary venous hypertension, sleep apnea, significantly abnormal CT scan and elevated ACE level  In his February 2021 admission he was indeterminate for QuantiFERON gold.  His Covid was negative HIV negative cryptococcal antigen titers negative.  However he was Legionella positive    He reports he wants to get to the bottom of his problems    No flowsheet data found.   Ref Range & Units 7 mo ago  QuantiFERON Incubation  Incubation performed.   QuantiFERON-TB Gold Plus Negative Indeterminate   Results for DAWN, KIPER (MRN [id number]) as of 02/21/2020 16:21  Ref. Range 07/17/2019 16:56  Strep Pneumo Urinary Antigen Latest Ref Range: NEGATIVE  NEGATIVE  L. pneumophila Serogp 1 Ur Ag Latest Ref Range: Negative  Positive (A)  Results for LATROY, GAYMON (MRN [id number]) as of 02/21/2020 16:21  - per HPI  Right heart catheterization January 2021 by Dr. Haroldine Laws  Findings:  RA = 9 RV = 36/12 PA = 38/6 (24) PCW = 11 Fick cardiac output/index = 7.1/2.7 PVR = 1.9 WU FA sat = 98% PA sat = 72%, 75% SVC sat = 75%  Assessment:  1. Very mild PAH with normal PVR  Plan/Discussion:  Recommend weight loss and CPAP.   Glori Bickers, MD  8:31 AM   Sleep study March 2021 interpreted by Dr. Golden Hurter -was a CPAP titration study  - An optimal PAP pressure was selected for this patient ( 18 cm of water) - Central sleep apnea was not noted during this titration (CAI = 0.2/h). - Severe oxygen desaturations were observed during this titration (min O2 = 80.0%). - No snoring was  audible during this study. - No cardiac abnormalities were observed during this study. - Clinically significant periodic limb movements were not noted during this study. Arousals associated with PLMs were rare.  Trial of CPAP therapy on 18 cm H2O with a Medium size Resmed Full Face Mask AirFit F20 mask and heated humidification -please not using it   IMPRESSION: CT CHEST FEB 2021 - personally visualized and when compared to October 2020 it is worse -this admission February 2021 labeled as septic emboli and community-acquired pneumonia with cavitary pneumonia. 1. Unfortunately, there is limited opacification  of the pulmonary arteries. There is no evidence of large central pulmonary embolus in the main pulmonary artery or main portions of the right and left pulmonary arteries. However, smaller emboli in the more peripheral branches of the pulmonary arteries cannot be excluded on the basis of this exam. 2. Stable calcified adenopathy is noted consistent with history of sarcoidosis. 3. Interval development of multiple rounded ill-defined opacities throughout both lungs concerning for multifocal pneumonia or septic emboli. The largest measures 9.7 x 8.6 cm in the right lung apex. Cavitary abnormality measuring 5.0 x 4.3 cm is noted in the left lower lobe posteriorly most consistent with cavitary pneumonia. 1.9 cm cavitary abnormality is also noted in the left lower lobe.   Electronically Signed   By: Marijo Conception M.D.   On: 07/16/2019 13:42   Results for LORON, WEIMER (MRN [id number]) as of 02/21/2020 12:09  Ref. Range 06/21/2010 20:05 12/18/2016 09:35 07/18/2019 11:09  IgE (Immunoglobulin E), Serum Latest Ref Range: 0.0 - 180.0 intl units/mL 27.6    Angiotensin-Converting Enzyme Latest Ref Range: 9 - 67 U/L  138 (H)   Cytoplasmic (C-ANCA) Latest Ref Range: Neg:<1:20 titer   <1:20  P-ANCA Latest Ref Range: Neg:<1:20 titer   <1:20  Atypical P-ANCA titer Latest Ref Range: Neg:<1:20  titer   <1:20   Results for ANVAY, TENNIS (MRN [id number]) as of 02/21/2020 16:21  Ref. Range 10/14/2019 00:00 02/03/2020 09:27  Creatinine Latest Ref Range: 0.40 - 1.50 mg/dL 1.1 1.30  Results for BROLY, HATFIELD (MRN [id number]) as of 02/21/2020 16:21  Ref. Range 07/20/2019 03:45 07/21/2019 04:06 08/01/2019 09:45 10/14/2019 00:00 02/03/2020 09:27  Hemoglobin Latest Ref Range: 13.0 - 17.0 g/dL 12.3 (L) 12.6 (L) 12.3 (L) 14.6 14.7      OV 04/05/2020  Subjective:  Patient ID: Sean Sharp, male , DOB: 1976-05-28 , age 73 y.o. , MRN: [id number] , ADDRESS: [address] [place] [address] PCP McGowen, Adrian Blackwater, MD Patient Care Team: Tammi Sou, MD as PCP - General (Family Medicine) Ruby Cola, MD as Consulting Physician (Otolaryngology) Chyrel Masson, DO as Consulting Physician (Otolaryngology) Harold Hedge, Darrick Grinder, MD as Consulting Physician (Allergy and Immunology) Tanda Rockers, MD as Consulting Physician (Pulmonary Disease) Marchia Bond, MD as Consulting Physician (Orthopedic Surgery) Rosita Kea, PA-C as Consulting Physician (Rheumatology) Bensimhon, Shaune Pascal, MD as Consulting Physician (Cardiology)  This Provider for this visit: Treatment Team:  Attending Provider: Brand Males, MD    04/05/2020 -   Chief Complaint  Patient presents with  . Follow-up    PFT performed today. Pt states he has been doing well since last visit and denies any complaints.     HPI Chinedum Vanhouten 45 y.o. - returns for follow-up.  At this point in time he is minimally symptomatic.  He still continues on his prednisone 7 spring of this year.  He says he has had his Covid vaccine but is in need of a booster.  He is yet to see his sleep specialist.  He had pulmonary function test that shows mild restriction to moderate restriction.  But he is essentially asymptomatic from a respiratory standpoint.  He had follow-up CT scan of the chest that shows improvement and resolution  of the Legionella pneumonia that he suffered in February 2021.  However in comparing to old CT scans of the chest - he did NOT have sarcoidosis in 2011 but was present in 2018 and oct 2020. In both seemed inflammatory but now Oct 2021 seems  fibrotic      PFT Results Latest Ref Rng & Units 04/05/2020  FVC-Pre L 3.30  FVC-Predicted Pre % 66  FVC-Post L 3.38  FVC-Predicted Post % 68  Pre FEV1/FVC % % 75  Post FEV1/FCV % % 77  FEV1-Pre L 2.48  FEV1-Predicted Pre % 62  FEV1-Post L 2.60  DLCO uncorrected ml/min/mmHg 25.13  DLCO UNC% % 73  DLCO corrected ml/min/mmHg 25.13  DLCO COR %Predicted % 73  DLVA Predicted % 121  TLC L 4.87  TLC % Predicted % 62  RV % Predicted % 45     CT chest high resolution Sept 2021  IMPRESSION: 1. Resolved bilateral multi lobar pneumonia. Residual postinfectious pneumatocele in the right upper lobe. 2. Extensive pulmonary parenchymal findings of sarcoidosis are stable in the interval since 07/16/2019 as detailed. 3. Stable calcified mediastinal and bilateral hilar adenopathy, compatible with sarcoidosis. 4. One vessel coronary atherosclerosis. 5. Symmetric mild bilateral gynecomastia, worsened.   Electronically Signed   By: Ilona Sorrel M.D.   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   OV 09/06/2020  Subjective:  Patient ID: Sean Sharp, male , DOB: 1976-04-09 , age 83 y.o. , MRN: [id number] , ADDRESS: [address] [place] [address] PCP McGowen, Adrian Blackwater, MD Patient Care  Team: Tammi Sou, MD as PCP - General (Family Medicine) Sueanne Margarita, MD as PCP - Sleep Medicine (Cardiology) Ruby Cola, MD as Consulting Physician (Otolaryngology) Chyrel Masson, DO as Consulting Physician (Otolaryngology) Harold Hedge, Darrick Grinder, MD as Consulting Physician (Allergy and Immunology) Tanda Rockers, MD as Consulting Physician (Pulmonary Disease) Marchia Bond, MD as Consulting Physician (Orthopedic Surgery) Rosita Kea, PA-C as Consulting Physician (Rheumatology) Bensimhon, Shaune Pascal, MD as Consulting Physician (Cardiology)  This Provider for this visit: Treatment Team:  Attending Provider: Brand Males, MD    09/06/2020 -   Chief Complaint  Patient presents with  . Follow-up    Doing ok, felt CBD oil helped with SOB  #SARCOID - he did NOT have sarcoidosis in 2011 but was present in 2018 and oct 2020. In both seemed inflammatory but now se/pOct 2021 seems fibrotic (per radiology: oct 2020 -> Sept 2021 there is very mild worsening of scar tissue esp in the right upper side and this could just represent the scar from the lgionella pneumoa in feb t)  - on monitoring approach  # Legionella pneumonia that he suffered in February 2021.  ->residual pneumatociele Sept/Oct 2021 CT   HPI Sean Sharp 45 y.o. -returns for follow-up.  Last visit was in the fall 2021.  At that time there is some concern about whether he was having progressive burned-out fibrotic lung disease from his sarcoid.  Radiology given opinion that he might be having that.  So we decided to get a pulmonary function test.  Today's pulmonary function test shows slight decline.  But the range is extremely small.  Meanwhile he tells me this is lupus is active the last few to several weeks his lupus pernio on the face and forearms have gotten worse.  Also worse in the shin possibly.  He saw  rheumatology Carma Leaven.  Some 3 weeks ago was placed on methotrexate 15 mg once a week.  Today he also  increased his prednisone 5 5 mg to 40 mg because of his arthropathy and skin lesions.  He has upcoming appointment with dermatology in the middle of April 2022.  But from a breathing standpoint he is better/stable.  He said he took CBD oil and this helped him.  He plans to go back to it.    CT Chest data  No results found.    PFT  PFT Results Latest Ref Rng & Units 07/24/2020 04/05/2020  FVC-Pre L 3.04 3.30  FVC-Predicted Pre % 61 66  FVC-Post L 3.29 3.38  FVC-Predicted Post % 66 68  Pre FEV1/FVC % % 70 75  Post FEV1/FCV % % 75 77  FEV1-Pre L 2.13 2.48  FEV1-Predicted Pre % 53 62  FEV1-Post L 2.48 2.60  DLCO uncorrected ml/min/mmHg 24.55 25.13  DLCO UNC% % 72 73  DLCO corrected ml/min/mmHg 24.55 25.13  DLCO COR %Predicted % 72 73  DLVA Predicted % 140 121  TLC L 4.72 4.87  TLC % Predicted % 60 62  RV % Predicted % 73 45       has a past medical history of Beta thalassemia minor, Chronic renal insufficiency, stage II (mild), Chronic rhinitis, Cutaneous sarcoidosis, Hoarseness (2015), HTN (hypertension), Hydrocele, LPRD (laryngopharyngeal reflux disease), Obesity, Class II, BMI 35-39.9, OSA (obstructive sleep apnea) (07/28/2019), Peripheral edema, Prediabetes (02/2017), Pulmonary arterial hypertension ([place]), 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   reports that he quit smoking about 25 years ago. His smoking use included cigarettes. He has a 0.45 pack-year smoking history. He has never used smokeless tobacco.  Past Surgical History:  Procedure Laterality Date  . OLECRANON BURSECTOMY Right 2019  . Polysomnogram  07/28/2019   Severe OSA w/hypoxia, mild central sleep apnea, CPAP recommended (Dr. Fransico Him)  . RIGHT HEART CATH N/A 06/27/2019   Mild PAH. Procedure: RIGHT HEART CATH;  Surgeon: Jolaine Artist, MD;  Location: [facility];  Service: Cardiovascular;  Laterality: N/A;  . SKIN BIOPSY  2018   Sarcoidosis of skin (also has pulm  involvement).  . SKIN GRAFT  2010   on left index finger  . TRANSTHORACIC ECHOCARDIOGRAM  06/22/2019   NORMAL  . Vocal cord surgery  2016   [facility]  . WISDOM TOOTH EXTRACTION  2005    Allergies  Allergen Reactions  . Wasp Venom Swelling    Immunization History  Administered Date(s) Administered  . Influenza,inj,Quad PF,6+ Mos 02/21/2020  . Influenza-Unspecified 04/08/2019  . PFIZER(Purple Top)SARS-COV-2 Vaccination 08/20/2019, 09/10/2019  . Pneumococcal Polysaccharide-23 08/01/2019  . Tdap 04/20/2018    Family History  Problem Relation Age of Onset  . Sarcoidosis Father        ?  Marland Kitchen Lactose intolerance Father   . Other Mother        colon issues-portion of colon removed  . Lactose intolerance Mother   . Cancer Paternal Grandfather        ?     Current Outpatient Medications:  .  amLODipine (NORVASC) 10 MG tablet, Take 1 tablet (10 mg total) by mouth daily., Disp: 90 tablet, Rfl: 3 .  Clocortolone Pivalate (CLODERM) 0.1 % cream, Apply 1 application topically 2 (two) times daily as needed (Sarcoidosis/rash). , Disp: , Rfl:  .  folic acid (FOLVITE) 1 MG tablet, Take 1 mg by mouth daily., Disp: , Rfl:  .  hydrocortisone butyrate (LUCOID) 0.1 % CREA cream, Apply 1 application topically 2 (two) times daily., Disp: 60 g, Rfl: 6 .  hydroxychloroquine (PLAQUENIL) 200 MG tablet, Take 400 mg by mouth daily., Disp: , Rfl:  .  levocetirizine (XYZAL) 5 MG tablet, SMARTSIG:1 Tablet(s) By Mouth Every Evening, Disp: , Rfl:  .  losartan (COZAAR) 50 MG tablet, Take 1 tablet (50 mg total) by mouth daily., Disp: 90 tablet, Rfl: 3 .  metFORMIN (GLUCOPHAGE-XR) 500 MG 24 hr tablet, Take 1 tablet (500 mg total) by mouth daily with breakfast., Disp: 30 tablet, Rfl: 6 .  methotrexate (RHEUMATREX) 2.5 MG tablet, Take 15 mg by mouth once a week., Disp: , Rfl:  .  metoprolol succinate (TOPROL-XL) 25 MG 24 hr tablet, Take 1.5 tablets (37.5 mg total) by mouth daily., Disp: 45 tablet, Rfl: 3 .   pantoprazole (PROTONIX) 40 MG tablet, TAKE 1 TABLET BY MOUTH 30 TO 60 MINUTES BEFORE YOUR FIRST AND LAST MEALS OF THE DAY, Disp: 180 tablet, Rfl: 1 .  predniSONE (DELTASONE) 5 MG tablet, Take 5 mg by mouth daily with breakfast., Disp: , Rfl:  .  spironolactone (ALDACTONE) 25 MG tablet, Take 1 tablet (25 mg total) by mouth once for 1 dose., Disp: 90 tablet, Rfl: 0      Objective:   Vitals:   09/06/20 1420  BP: 128/80  Pulse: 100  Temp: 97.9 F (36.6 C)  TempSrc: Oral  SpO2: 98%  Weight: 299 lb 12.8 oz (136 kg)  Height: 6' 4" (1.93 m)    Estimated body mass index is 36.49 kg/m as calculated from the following:   Height as of this encounter: 6' 4" (1.93 m).   Weight as of this encounter: 299 lb 12.8 oz (136 kg).  _0 @  Filed Weights   09/06/20 1420  Weight: 299 lb 12.8 oz (136 kg)     Physical Exam  General: No distress. Obese. LUPUS P{ERNIO + Neuro: Alert and Oriented x 3. GCS 15. Speech normal Psych: Pleasant Resp:  Barrel Chest - no.  Wheeze - no, Crackles - no, No overt respiratory distress CVS: Normal heart sounds. Murmurs - no Ext: Stigmata of Connective Tissue Disease - no HEENT: Normal upper airway. PEERL +. No post nasal drip        Assessment:     No diagnosis found.     Plan:     Patient Instructions  Sarcoidosis with skin and lung involvement   -It appears the skin involvement with sarcoid [lupus pernio] is worse -In addition joint involvement is worse -[facility] rheumatology started on methotrexate 15 mg once a week with folic acid approximately 3 weeks ago -Noticed that you increase your prednisone because of the symptoms to 40 mg once daily starting today  Plan -Continue prednisone 40 mg once daily for 1 week and then go down to 30 mg once daily x1 week and then 20 mg daily x1 week and then 10 mg once daily x1 week and at that point can continue or go down to 5 mg once daily baseline dose  -Possible that dermatology can modify this  regimen and if so follow their advice   -Methotrexate per rheumatology  -Do high-resolution CT chest supine and prone 1 year follow-up in September 2022 -Do full pulmonary function test in September 2022  Follow-up -Return to see Dr. Chase Caller in September 2022 but after CT and PFT     SIGNATURE    Dr. Brand Males, M.D., F.C.C.P,  Pulmonary and Critical Care  Medicine Staff Physician, [place] Director - Interstitial Lung Disease  Program  Pulmonary [place] [facility] [place], [place], [address]  Pager: [phone], If no answer or between  15:00h - 7:00h: call [phone] Telephone: [phone]  2:52 PM 09/06/2020

## 2020-09-06 NOTE — Addendum Note (Signed)
Addended byCoralie Keens on: 09/06/2020 02:55 PM   Modules accepted: Orders

## 2020-09-06 NOTE — Patient Instructions (Addendum)
Sarcoidosis with skin and lung involvement   -It appears the skin involvement with sarcoid [lupus pernio] is worse -In addition joint involvement is worse -[facility] rheumatology started on methotrexate 15 mg once a week with folic acid approximately 3 weeks ago -Noticed that you increase your prednisone because of the symptoms to 40 mg once daily starting today  Plan -Continue prednisone 40 mg once daily for 1 week and then go down to 30 mg once daily x1 week and then 20 mg daily x1 week and then 10 mg once daily x1 week and at that point can continue or go down to 5 mg once daily baseline dose  -Possible that dermatology can modify this regimen and if so follow their advice   -Methotrexate per rheumatology  -Do high-resolution CT chest supine and prone 1 year follow-up in September 2022 -Do full pulmonary function test in September 2022  - recommend 3rd shot covid vaccine  Follow-up -Return to see Dr. Chase Caller in September 2022 but after CT and PFT

## 2020-09-10 DIAGNOSIS — R0981 Nasal congestion: Secondary | ICD-10-CM | POA: Insufficient documentation

## 2020-09-13 ENCOUNTER — Other Ambulatory Visit: Payer: Self-pay

## 2020-09-13 ENCOUNTER — Encounter: Payer: Self-pay | Admitting: Cardiology

## 2020-09-13 ENCOUNTER — Telehealth (INDEPENDENT_AMBULATORY_CARE_PROVIDER_SITE_OTHER): Payer: BC Managed Care – PPO | Admitting: Cardiology

## 2020-09-13 VITALS — Ht 74.0 in | Wt 290.0 lb

## 2020-09-13 DIAGNOSIS — I10 Essential (primary) hypertension: Secondary | ICD-10-CM

## 2020-09-13 DIAGNOSIS — G4733 Obstructive sleep apnea (adult) (pediatric): Secondary | ICD-10-CM

## 2020-09-13 DIAGNOSIS — D869 Sarcoidosis, unspecified: Secondary | ICD-10-CM

## 2020-09-13 NOTE — Patient Instructions (Signed)
Medication Instructions:  Your physician has recommended you make the following change in your medication: None   Labwork: None ordered.  Testing/Procedures: Urgent referral sent to Dr. Redmond Baseman  Follow-Up: Your physician recommends that you schedule a follow-up appointment in:     Any Other Special Instructions Will Be Listed Below (If Applicable).     If you need a refill on your cardiac medications before your next appointment, please call your pharmacy.

## 2020-09-13 NOTE — Progress Notes (Signed)
Virtual Visit via Video Note   This visit type was conducted due to national recommendations for restrictions regarding the COVID-19 Pandemic (e.g. social distancing) in an effort to limit this patient's exposure and mitigate transmission in our community.  Due to his co-morbid illnesses, this patient is at least at moderate risk for complications without adequate follow up.  This format is felt to be most appropriate for this patient at this time.  The patient did not have access to video technology/had technical difficulties with video requiring transitioning to audio format only (telephone).  All issues noted in this document were discussed and addressed.  No physical exam could be performed with this format.  Please refer to the patient's chart for his  consent to telehealth for [facility].    Date:  09/13/2020   ID:  Sean Sharp, DOB August 10, 1975, MRN [id number] The patient was identified using 2 identifiers.  Patient Location: Home Provider Location: Home Office  PCP:  Sean Sharp  Cardiologist:  Sean Sharp Electrophysiologist:  None   Evaluation Performed:  Follow-Up Visit  Chief Complaint:  OSA  History of Present Illness:    Sean Sharp is a 45 y.o. male with a hx of CKD stage 2, HTN, Pulmonary HTN and Sarcoidosis who was referred for sleep study by Sean Sharp.  He had a sleep study 3-4 years ago and he used a CPAP device but he could never get it adjusted and so he did not use it.  He underwent [place] sleep study showing severe OSA with an AHI of 84.6/hr and nocturnal hypoxemia with O2 sats as low as 83%.  9.8 min were spent with O2 sats < 88% c/w nocturnal hypoxemia.  Moderate to severe snoring was also noted.  He underwent CPAP titration to 18cm H2O.  Unfortunately his device never got ordered.   When he initially had a CPAP several years ago and says that something was not working.  The first night he used it he felt great the next day and now  he does not notice a difference in how he feels.   He tolerated the full face mask but felt the pressure is not adequate.  He finally got his new CPAP device.  He tells me that he still has issues   The patient does not have symptoms concerning for COVID-19 infection (fever, chills, cough, or new shortness of breath).   Past Medical History:  Diagnosis Date  . 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  Hemoglobin electrophoresis normal 2016.  Marland Kitchen Chronic renal insufficiency, stage II (mild)   . Chronic rhinitis   . Cutaneous sarcoidosis    [place] rheum: as of 02/2019->plaquenil.  Cont plaquenil and slowly tapering prednisone as of 06/2019 rheum f/u  . Hoarseness 2015   [place] [facility] 04/2014-normal laryngoscopy: prednisone taper and bid nexium recommended.  No signif help so pt got 2nd opinion at [facility]   . HTN (hypertension)   . Hydrocele    left  . LPRD (laryngopharyngeal reflux disease)   . Obesity, Class II, BMI 35-39.9   . OSA (obstructive sleep apnea) 07/28/2019   SEVERE OSA, mild central sleep apnea, CPAP trial 08/2019 (Dr. Fransico Sharp)  . Peripheral edema    left leg>right leg  . Prediabetes 02/2017   A1c 6.3% (right when he was starting to take prednisone prn for treatment of his sarcoidosis).  HbA1c 08/2017=6.3%. A1c 01/2020 6.4%.  . Pulmonary arterial hypertension (HCC)    Mild (R heart  cath 06/27/19), echo 06/2019 normal.  Wt loss and CPAP recommended.  . Sarcoidosis of lung ([place]) 12/2016   2018, but Dr. Melvyn Novas suspects it was smoldering since 2011.  Skin bx of cutaneous lesion confirmed dx of sarcoidosis 02/2017.  Pt improving fall 2018 with plaquenil and prednisone.  Pt self d/c'd plaquenil.  . Vocal cord polyps 2015   surgery [facility]    Past Surgical History:  Procedure Laterality Date  . OLECRANON BURSECTOMY Right 2019  . Polysomnogram  07/28/2019   Severe OSA w/hypoxia, mild central sleep apnea, CPAP recommended (Dr. Fransico Sharp)  . RIGHT HEART CATH N/A 06/27/2019   Mild PAH. Procedure:  RIGHT HEART CATH;  Surgeon: Sean Sharp;  Location: [place] [facility];  Service: Cardiovascular;  Laterality: N/A;  . 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  . SKIN GRAFT  2010   on left index finger  . TRANSTHORACIC ECHOCARDIOGRAM  06/22/2019   NORMAL  . Vocal cord surgery  2016   [facility]  . WISDOM TOOTH EXTRACTION  2005     Current Meds  Medication Sig  . amLODipine (NORVASC) 10 MG tablet Take 1 tablet (10 mg total) by mouth daily.  . Azelastine-Fluticasone 137-50 MCG/ACT SUSP Place 2 sprays into both nostrils daily.  . Clocortolone Pivalate (CLODERM) 0.1 % cream Apply 1 application topically 2 (two) times daily as needed (Sarcoidosis/rash).   . folic acid (FOLVITE) 1 MG tablet Take 1 mg by mouth once a week.  . hydrocortisone butyrate (LUCOID) 0.1 % CREA cream Apply 1 application topically 2 (two) times daily.  . hydroxychloroquine (PLAQUENIL) 200 MG tablet Take 400 mg by mouth daily.  Marland Kitchen levocetirizine (XYZAL) 5 MG tablet SMARTSIG:1 Tablet(s) By Mouth Every Evening  . losartan (COZAAR) 50 MG tablet Take 1 tablet (50 mg total) by mouth daily.  . metFORMIN (GLUCOPHAGE-XR) 500 MG 24 hr tablet Take 1 tablet (500 mg total) by mouth daily with breakfast.  . methotrexate (RHEUMATREX) 2.5 MG tablet Take 15 mg by mouth once a week.  . metoprolol succinate (TOPROL-XL) 25 MG 24 hr tablet Take 1.5 tablets (37.5 mg total) by mouth daily. (Patient taking differently: Take 25 mg by mouth daily.)  . pantoprazole (PROTONIX) 40 MG tablet TAKE 1 TABLET BY MOUTH 30 TO 60 MINUTES BEFORE YOUR FIRST AND LAST MEALS OF THE DAY  . predniSONE (DELTASONE) 5 MG tablet Take 5 mg by mouth daily with breakfast.  . spironolactone (ALDACTONE) 25 MG tablet Take 1 tablet (25 mg total) by mouth once for 1 dose.     Allergies:   Wasp venom   Social History   Tobacco Use  . Smoking status: Former Smoker    Packs/day: 0.30    Years: 1.50    Pack years: 0.45    Types:  Cigarettes    Quit date: 06/10/1995    Years since quitting: 25.2  . Smokeless tobacco: Never Used  Substance Use Topics  . Alcohol use: Yes    Comment: occasional  . Drug use: No     Family Hx: The patient's family history includes Cancer in his paternal grandfather; Lactose intolerance in his father and mother; Other in his mother; Sarcoidosis in his father.  ROS:   Please see the history of present illness.     All other systems reviewed and are negative.   Prior CV studies:   The following studies were reviewed today:  PAP compliance download  Labs/Other Tests and  Data Reviewed:    EKG:  No ECG reviewed.  Recent Labs: 02/03/2020: Hemoglobin 14.7; Platelets 215.0; TSH 1.49 08/21/2020: ALT 17; BUN 16; Creatinine, Ser 1.25; Potassium 4.0; Sodium 136   Recent Lipid Panel Lab Results  Component Value Date/Time   CHOL 157 08/21/2020 08:54 AM   TRIG 100.0 08/21/2020 08:54 AM   HDL 31.50 (L) 08/21/2020 08:54 AM   CHOLHDL 5 08/21/2020 08:54 AM   LDLCALC 105 (H) 08/21/2020 08:54 AM    Wt Readings from Last 3 Encounters:  09/13/20 290 lb (131.5 kg)  09/06/20 299 lb 12.8 oz (136 kg)  08/21/20 (!) 300 lb 9.6 oz (136.4 kg)     Risk Assessment/Calculations:      Objective:    Vital Signs:  Ht _0  (1.88 m)   Wt 290 lb (131.5 kg)   BMI 37.23 kg/m    Well nourished, well developed male in no acute distress. Well appearing, alert and conversant, regular work of breathing,  good skin color  Eyes- anicteric mouth- oral mucosa is pink  neuro- grossly intact skin- no apparent rash or lesions or cyanosis   ASSESSMENT & PLAN:    1.  OSA  -he has a new PAP device but has not been able to use it because he has had severe issues with nasal congestion and has been seeing an ENT with [facility] and they think he may nasal sarcoid -He has been placed on nasal steroids and will see if that improves his symptoms to the point that he can use his PAP device -in the interim I  will refer Sharp back to ENT to see if he would be a candidate for the Inspire Hypoglossal N stimulator  2.  HTN -continue amlodipine 49m daily, Losaratn 549mdaily, spiro 2583maily and Toprol 76m38mily   3.  Morbid Obesity -I have encouraged Sharp to get into a routine exercise program and cut back on carbs and portions.    Medication Adjustments/Labs and Tests Ordered: Current medicines are reviewed at length with the patient today.  Concerns regarding medicines are outlined above.   Tests Ordered: Orders Placed This Encounter  Procedures  . Ambulatory referral to ENT    Medication Changes: No orders of the defined types were placed in this encounter.   Follow Up:   in 1 year(s)  Signed, TracFransico Sharp  09/13/2020 7:56 PM    [facility]

## 2020-10-02 ENCOUNTER — Telehealth: Payer: Self-pay

## 2020-10-02 NOTE — Telephone Encounter (Signed)
Error

## 2020-10-22 ENCOUNTER — Other Ambulatory Visit (HOSPITAL_COMMUNITY): Payer: Self-pay

## 2020-10-22 ENCOUNTER — Other Ambulatory Visit: Payer: Self-pay

## 2020-10-22 MED ORDER — SPIRONOLACTONE 25 MG PO TABS: 25.0000 mg | ORAL_TABLET | Freq: Every day | ORAL | 0 refills | Status: DC

## 2020-10-22 MED ORDER — SPIRONOLACTONE 25 MG PO TABS
25.0000 mg | ORAL_TABLET | Freq: Every day | ORAL | 3 refills | Status: DC
Start: 2020-10-22 — End: 2021-12-27

## 2020-11-19 ENCOUNTER — Telehealth: Payer: Self-pay | Admitting: Internal Medicine

## 2020-11-19 ENCOUNTER — Other Ambulatory Visit: Payer: Self-pay

## 2020-11-19 MED ORDER — AZITHROMYCIN 250 MG PO TABS: 250.0000 mg | ORAL_TABLET | ORAL | 0 refills | Status: DC

## 2020-11-19 MED ORDER — PREDNISONE 10 MG PO TABS: ORAL_TABLET | ORAL | 0 refills | Status: DC

## 2020-11-19 NOTE — Telephone Encounter (Signed)
Called the pt, after several rings phone picked up but no one would speak  Will call back later

## 2020-11-19 NOTE — Telephone Encounter (Signed)
Spoke with the pt and notified of response per MR  Pt verbalized understanding  Will take home test and PCR if neg, will let us know   Rxs sent to pharm for pred and zpack

## 2020-11-19 NOTE — Telephone Encounter (Signed)
He should definitely get a COVID antigen test.  If this is positive we will call in antiviral.  If it is negative he should go get a COVID PCR test   He has sarcoidosis  STart Z pak  Take prednisone 40 mg daily x 2 days, then 81m daily x 2 days, then 182mdaily x 2 days, then 52m12maily x 2 days and stop        Current Outpatient Medications:    amLODipine (NORVASC) 10 MG tablet, Take 1 tablet (10 mg total) by mouth daily., Disp: 90 tablet, Rfl: 3   Azelastine-Fluticasone 137-50 MCG/ACT SUSP, Place 2 sprays into both nostrils daily., Disp: , Rfl:    clobetasol ointment (TEMOVATE) 0.05 %, Apply topically., Disp: , Rfl:    Clocortolone Pivalate (CLODERM) 0.1 % cream, Apply 1 application topically 2 (two) times daily as needed (Sarcoidosis/rash). , Disp: , Rfl:    folic acid (FOLVITE) 1 MG tablet, Take 1 mg by mouth once a week., Disp: , Rfl:    hydrocortisone butyrate (LUCOID) 0.1 % CREA cream, Apply 1 application topically 2 (two) times daily., Disp: 60 g, Rfl: 6   hydroxychloroquine (PLAQUENIL) 200 MG tablet, Take 400 mg by mouth daily., Disp: , Rfl:    hydrOXYzine (ATARAX/VISTARIL) 10 MG tablet, TAKE 1 TO 3 TABLETS BY MOUTH EVERY 4 TO 6 HOURS AS NEEDED FOR ITCHING, Disp: , Rfl:    levocetirizine (XYZAL) 5 MG tablet, SMARTSIG:1 Tablet(s) By Mouth Every Evening, Disp: , Rfl:    losartan (COZAAR) 50 MG tablet, Take 1 tablet (50 mg total) by mouth daily., Disp: 90 tablet, Rfl: 3   metFORMIN (GLUCOPHAGE-XR) 500 MG 24 hr tablet, Take 1 tablet (500 mg total) by mouth daily with breakfast., Disp: 30 tablet, Rfl: 6   methotrexate (RHEUMATREX) 2.5 MG tablet, Take 15 mg by mouth once a week., Disp: , Rfl:    metoprolol succinate (TOPROL-XL) 25 MG 24 hr tablet, Take 1.5 tablets (37.5 mg total) by mouth daily. (Patient taking differently: Take 25 mg by mouth daily.), Disp: 45 tablet, Rfl: 3   pantoprazole (PROTONIX) 40 MG tablet, TAKE 1 TABLET BY MOUTH 30 TO 60 MINUTES BEFORE YOUR FIRST AND LAST  MEALS OF THE DAY, Disp: 180 tablet, Rfl: 1   predniSONE (DELTASONE) 5 MG tablet, Take 5 mg by mouth daily with breakfast., Disp: , Rfl:    spironolactone (ALDACTONE) 25 MG tablet, Take 1 tablet (25 mg total) by mouth daily., Disp: 90 tablet, Rfl: 3     has a past medical history of Beta thalassemia minor, Chronic renal insufficiency, stage II (mild), Chronic rhinitis, Cutaneous sarcoidosis, Hoarseness (2015), HTN (hypertension), Hydrocele, LPRD (laryngopharyngeal reflux disease), Obesity, Class II, BMI 35-39.9, OSA (obstructive sleep apnea) (07/28/2019), Peripheral edema, Prediabetes (02/2017), Pulmonary arterial hypertension ([place]), and Vocal cord polyps (2015).

## 2020-11-19 NOTE — Telephone Encounter (Signed)
Spoke with the pt  He c/o chest congestion for the past 1-2 days  He states he can feel the mucus in his chest esp when he exerts himself  He is not having any increased SOB, but noticed some wheezing last night  He is not coughing much, and when he does it's non prod He denies any fatigue, f/c/s, aches  He states that he has been off of pred for almost a month, but still taking plaquenil 400 mg daily  Please advise, thanks!  Allergies  Allergen Reactions   Wasp Venom Swelling

## 2020-11-20 NOTE — Progress Notes (Deleted)
OFFICE VISIT  11/20/2020  CC: No chief complaint on file.   HPI:    Patient is a 45 y.o. African-American male who presents for 3 mo f/u HTN and prediabetes.  He has pulm and cutaneous sarcoidosis and is on chronic prednisone as well as methotrexate treatment for this.  Also has OSA and secondary pulm HTN. He is followed by pulm, derm, and cardiology. A/P as of last visit: "1) Prediabetes: took metformin only 1 mo->?pharmacy error on RFs?? Still working on [employer]. Plan restart metformin ER 500 mg qd, recheck Hba1c today as well as check flp and cmet.   2) HTN: well controlled on current regimen (see HPI)--continue. Lytes/cr today.   3) Sarcoidosis: pulm aspect seems quiescent at this time. He is on methotrexate q week and also takes prednisone on more of a prn basis in bursts. He'll be following with derm as usual.  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  Hemoglobin electrophoresis normal 2016.   Chronic renal insufficiency, stage II (mild)    Chronic rhinitis    Cutaneous sarcoidosis    [place] rheum: as of 02/2019->plaquenil.  Cont plaquenil and slowly tapering prednisone as of 06/2019 rheum f/u   Hoarseness 2015   [facility] 04/2014-normal laryngoscopy: prednisone taper and bid nexium recommended.  No signif help so pt got 2nd opinion at [facility]    HTN (hypertension)    Hydrocele    left   LPRD (laryngopharyngeal reflux disease)    Obesity, Class II, BMI 35-39.9    OSA (obstructive sleep apnea) 07/28/2019   SEVERE OSA, mild central sleep apnea, CPAP trial 08/2019 (Dr. Fransico Him)   Peripheral edema    left leg>right leg   Prediabetes 02/2017   A1c 6.3% (right when he was starting to take prednisone prn for treatment of his sarcoidosis).  HbA1c 08/2017=6.3%. A1c 01/2020 6.4%.   Pulmonary arterial hypertension (HCC)    Mild (R heart  cath 06/27/19), echo 06/2019 normal.  Wt loss and CPAP recommended.   Sarcoidosis of lung ([place]) 12/2016   2018, but Dr. Melvyn Novas suspects it was smoldering since 2011.  Skin bx of cutaneous lesion confirmed dx of sarcoidosis 02/2017.  Pt improving fall 2018 with plaquenil and prednisone.  Pt self d/c'd plaquenil.   Vocal cord polyps 2015   surgery [facility]     Past Surgical History:  Procedure Laterality Date   OLECRANON BURSECTOMY Right 2019   Polysomnogram  07/28/2019   Severe OSA w/hypoxia, mild central sleep apnea, CPAP recommended (Dr. Fransico Him)   RIGHT HEART CATH N/A 06/27/2019   Mild PAH. Procedure: RIGHT HEART CATH;  Surgeon: Jolaine Artist, MD;  Location: [place] [facility];  Service: Cardiovascular;  Laterality: N/A;   SKIN BIOPSY  2018   Sarcoidosis of skin (also has pulm involvement).   SKIN GRAFT  2010   on left index finger   TRANSTHORACIC ECHOCARDIOGRAM  06/22/2019   NORMAL   Vocal cord surgery  2016   [facility]   WISDOM TOOTH EXTRACTION  2005    Outpatient Medications Prior to Visit  Medication Sig Dispense Refill   amLODipine (NORVASC) 10 MG tablet Take 1 tablet (10 mg total) by mouth daily. 90 tablet 3   Azelastine-Fluticasone 137-50 MCG/ACT SUSP Place 2 sprays into both nostrils daily.     azithromycin (  ZITHROMAX) 250 MG tablet Take 1 tablet (250 mg total) by mouth as directed. 6 tablet 0   clobetasol ointment (TEMOVATE) 0.05 % Apply topically.     Clocortolone Pivalate (CLODERM) 0.1 % cream Apply 1 application topically 2 (two) times daily as needed (Sarcoidosis/rash).      folic acid (FOLVITE) 1 MG tablet Take 1 mg by mouth once a week.     hydrocortisone butyrate (LUCOID) 0.1 % CREA cream Apply 1 application topically 2 (two) times daily. 60 g 6   hydroxychloroquine (PLAQUENIL) 200 MG tablet Take 400 mg by mouth daily.     hydrOXYzine (ATARAX/VISTARIL) 10 MG tablet TAKE 1 TO 3 TABLETS BY MOUTH EVERY 4 TO 6 HOURS AS NEEDED FOR ITCHING     levocetirizine (XYZAL) 5  MG tablet SMARTSIG:1 Tablet(s) By Mouth Every Evening     losartan (COZAAR) 50 MG tablet Take 1 tablet (50 mg total) by mouth daily. 90 tablet 3   metFORMIN (GLUCOPHAGE-XR) 500 MG 24 hr tablet Take 1 tablet (500 mg total) by mouth daily with breakfast. 30 tablet 6   methotrexate (RHEUMATREX) 2.5 MG tablet Take 15 mg by mouth once a week.     metoprolol succinate (TOPROL-XL) 25 MG 24 hr tablet Take 1.5 tablets (37.5 mg total) by mouth daily. (Patient taking differently: Take 25 mg by mouth daily.) 45 tablet 3   pantoprazole (PROTONIX) 40 MG tablet TAKE 1 TABLET BY MOUTH 30 TO 60 MINUTES BEFORE YOUR FIRST AND LAST MEALS OF THE DAY 180 tablet 1   predniSONE (DELTASONE) 10 MG tablet 4 x 2 days, 2 x 2 days, 1 x 2 days, 1/2 x 2 days and stop 15 tablet 0   predniSONE (DELTASONE) 5 MG tablet Take 5 mg by mouth daily with breakfast.     spironolactone (ALDACTONE) 25 MG tablet Take 1 tablet (25 mg total) by mouth daily. 90 tablet 3   No facility-administered medications prior to visit.    Allergies  Allergen Reactions   Wasp Venom Swelling    ROS As per HPI  PE: Vitals with BMI 09/13/2020 09/06/2020 08/21/2020  Height _0  _1  _2   Weight 290 lbs 299 lbs 13 oz 300 lbs 10 oz  BMI 37.22 37.48 27.07  Systolic - 867 544  Diastolic - 80 78  Pulse - 100 77      LABS:  Lab Results  Component Value Date   TSH 1.49 02/03/2020   Lab Results  Component Value Date   WBC 6.8 02/03/2020   HGB 14.7 02/03/2020   HCT 46.5 02/03/2020   MCV 70.3 (L) 02/03/2020   PLT 215.0 02/03/2020   Lab Results  Component Value Date   IRON 58 11/03/2014   TIBC 270 11/03/2014   FERRITIN 466 (H) 07/16/2019   Lab Results  Component Value Date   CREATININE 1.25 08/21/2020   BUN 16 08/21/2020   NA 136 08/21/2020   K 4.0 08/21/2020   CL 101 08/21/2020   CO2 29 08/21/2020   Lab Results  Component Value Date   ALT 17 08/21/2020   AST 17 08/21/2020   ALKPHOS 56 08/21/2020   BILITOT 0.5 08/21/2020    Lab Results  Component Value Date   CHOL 157 08/21/2020   Lab Results  Component Value Date   HDL 31.50 (L) 08/21/2020   Lab Results  Component Value Date   LDLCALC 105 (H) 08/21/2020   Lab Results  Component Value Date   TRIG 100.0 08/21/2020  Lab Results  Component Value Date   CHOLHDL 5 08/21/2020   Lab Results  Component Value Date   PSA 0.79 08/25/2014   Lab Results  Component Value Date   HGBA1C 6.0 08/21/2020    IMPRESSION AND PLAN:  No problem-specific Assessment & Plan notes found for this encounter.  Cbc,cmet, hba1c  An After Visit Summary was printed and given to the patient.  FOLLOW UP: No follow-ups on file.  Signed:  Crissie Sickles, MD           11/20/2020

## 2020-11-21 ENCOUNTER — Ambulatory Visit: Payer: BC Managed Care – PPO | Admitting: Family Medicine

## 2020-11-21 DIAGNOSIS — Z0289 Encounter for other administrative examinations: Secondary | ICD-10-CM

## 2020-12-13 ENCOUNTER — Ambulatory Visit: Payer: BC Managed Care – PPO | Admitting: Cardiology

## 2020-12-28 ENCOUNTER — Encounter: Payer: Self-pay | Admitting: Family Medicine

## 2021-01-11 ENCOUNTER — Ambulatory Visit: Payer: BC Managed Care – PPO | Admitting: Family Medicine

## 2021-01-11 ENCOUNTER — Encounter: Payer: Self-pay | Admitting: Family Medicine

## 2021-01-11 ENCOUNTER — Other Ambulatory Visit: Payer: Self-pay

## 2021-01-11 VITALS — BP 125/77 | HR 85 | Temp 97.9°F | Resp 16 | Ht 76.0 in | Wt 288.4 lb

## 2021-01-11 DIAGNOSIS — K219 Gastro-esophageal reflux disease without esophagitis: Secondary | ICD-10-CM

## 2021-01-11 DIAGNOSIS — R7303 Prediabetes: Secondary | ICD-10-CM | POA: Diagnosis not present

## 2021-01-11 DIAGNOSIS — J3489 Other specified disorders of nose and nasal sinuses: Secondary | ICD-10-CM

## 2021-01-11 DIAGNOSIS — I10 Essential (primary) hypertension: Secondary | ICD-10-CM

## 2021-01-11 DIAGNOSIS — E7800 Pure hypercholesterolemia, unspecified: Secondary | ICD-10-CM | POA: Diagnosis not present

## 2021-01-11 DIAGNOSIS — Z79899 Other long term (current) drug therapy: Secondary | ICD-10-CM | POA: Diagnosis not present

## 2021-01-11 DIAGNOSIS — N179 Acute kidney failure, unspecified: Secondary | ICD-10-CM

## 2021-01-11 LAB — COMPREHENSIVE METABOLIC PANEL
ALT: 16 U/L (ref 0–53)
AST: 18 U/L (ref 0–37)
Albumin: 4.1 g/dL (ref 3.5–5.2)
Alkaline Phosphatase: 58 U/L (ref 39–117)
BUN: 14 mg/dL (ref 6–23)
CO2: 24 mEq/L (ref 19–32)
Calcium: 9.4 mg/dL (ref 8.4–10.5)
Chloride: 102 mEq/L (ref 96–112)
Creatinine, Ser: 1.15 mg/dL (ref 0.40–1.50)
GFR: 76.91 mL/min (ref >60.00)
Glucose, Bld: 96 mg/dL (ref 70–99)
Potassium: 4.5 mEq/L (ref 3.5–5.1)
Sodium: 136 mEq/L (ref 135–145)
Total Bilirubin: 0.6 mg/dL (ref 0.2–1.2)
Total Protein: 7.4 g/dL (ref 6.0–8.3)

## 2021-01-11 LAB — CBC WITH DIFFERENTIAL/PLATELET
Basophils Absolute: 0 10*3/uL (ref 0.0–0.1)
Basophils Relative: 0.6 % (ref 0.0–3.0)
Eosinophils Absolute: 0.1 10*3/uL (ref 0.0–0.7)
Eosinophils Relative: 2 % (ref 0.0–5.0)
HCT: 44.9 % (ref 39.0–52.0)
Hemoglobin: 14 g/dL (ref 13.0–17.0)
Lymphocytes Relative: 4.4 % — ABNORMAL LOW (ref 12.0–46.0)
Lymphs Abs: 0.3 10*3/uL — ABNORMAL LOW (ref 0.7–4.0)
MCHC: 31.3 g/dL (ref 30.0–36.0)
MCV: 69.9 fl — ABNORMAL LOW (ref 78.0–100.0)
Monocytes Absolute: 0.6 10*3/uL (ref 0.1–1.0)
Monocytes Relative: 7.7 % (ref 3.0–12.0)
Neutro Abs: 6.5 10*3/uL (ref 1.4–7.7)
Neutrophils Relative %: 85.3 % — ABNORMAL HIGH (ref 43.0–77.0)
Platelets: 246 10*3/uL (ref 150.0–400.0)
RBC: 6.42 Mil/uL — ABNORMAL HIGH (ref 4.22–5.81)
RDW: 17.1 % — ABNORMAL HIGH (ref 11.5–15.5)
WBC: 7.6 10*3/uL (ref 4.0–10.5)

## 2021-01-11 LAB — HEMOGLOBIN A1C: Hgb A1c MFr Bld: 6.1 % (ref 4.6–6.5)

## 2021-01-11 MED ORDER — PANTOPRAZOLE SODIUM 40 MG PO TBEC: DELAYED_RELEASE_TABLET | ORAL | 3 refills | Status: AC

## 2021-01-11 MED ORDER — METOPROLOL SUCCINATE ER 25 MG PO TB24: 37.5000 mg | ORAL_TABLET | Freq: Every day | ORAL | 3 refills | Status: DC

## 2021-01-11 MED ORDER — IPRATROPIUM BROMIDE 0.03 % NA SOLN: 2.0000 | Freq: Two times a day (BID) | NASAL | 12 refills | Status: DC

## 2021-01-11 MED ORDER — AMLODIPINE BESYLATE 10 MG PO TABS
10.0000 mg | ORAL_TABLET | Freq: Every day | ORAL | 3 refills | Status: DC
Start: 2021-01-11 — End: 2022-02-12

## 2021-01-11 NOTE — Progress Notes (Signed)
OFFICE VISIT  01/11/2021  CC:  Chief Complaint  Patient presents with   Follow-up    [name], pt is fasting   HPI:    Patient is a 45 y.o. African-American male who presents for 4 mo f/u prediabetes, HTN, high risk med use (methotrexate, hydroxychloroquine, predisone fairly regularly) for pulm and cutaneous sarcoidosis. A/P as of last visit: "1) Prediabetes: took metformin only 1 mo->?pharmacy error on RFs?? Still working on [employer]. Plan restart metformin ER 500 mg qd, recheck Hba1c today as well as check flp and cmet.   2) HTN: well controlled on current regimen (see HPI)--continue. Lytes/cr today.   3) Sarcoidosis: pulm aspect seems quiescent at this time. He is on methotrexate q week and also takes prednisone on more of a prn basis in bursts. He'll be following with derm as usual"  INTERIM HX: Labs good last visit, a1c down to 6%. Says doing ok.  Recently all meds lost when he took a trip to [place].  He has not been taking his metformin.  Wants to work on wt loss. Says he is eating healthier--more of a keto diet, trying to exercise more.  Has lost 11 lbs purposefully recently.  No home bp monitoring.  Recent profuse clear/thin nasal drainage, PND, cough.  No clear trigger.  Happens randomly and not veru frequent but when occurs lasts several days.  Most recent episode he started prednisone in response and says it has helped--still tapering now.  TAkes dymista and zyrtec qd.    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  Hemoglobin electrophoresis normal 2016.   Chronic renal insufficiency, stage II (mild)    Chronic rhinitis    Cutaneous sarcoidosis    [place] rheum: as of 02/2019->plaquenil.  Cont plaquenil and slowly tapering prednisone as of 06/2019 rheum f/u   Hoarseness 2015   [facility] 04/2014-normal laryngoscopy: prednisone taper and bid nexium recommended.  No signif help so pt got 2nd opinion at [facility]    HTN (hypertension)     Hydrocele    left   LPRD (laryngopharyngeal reflux disease)    Obesity, Class II, BMI 35-39.9    OSA (obstructive sleep apnea) 07/28/2019   SEVERE OSA, mild central sleep apnea, CPAP trial 08/2019 (Dr. Fransico Him)   Peripheral edema    left leg>right leg   Prediabetes 02/2017   A1c 6.3% (right when he was starting to take prednisone prn for treatment of his sarcoidosis).  HbA1c 08/2017=6.3%. A1c 01/2020 6.4%.   Pulmonary arterial hypertension (HCC)    Mild (R heart cath 06/27/19), echo 06/2019 normal.  Wt loss and CPAP recommended.   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 Melvyn Novas suspects it was smoldering since 2011.  Skin bx of cutaneous lesion confirmed dx of sarcoidosis 02/2017.  Pt improving fall 2018 with plaquenil and prednisone.  Pt self d/c'd plaquenil.   Vocal cord polyps 2015   surgery [facility]     Past Surgical History:  Procedure Laterality Date   OLECRANON BURSECTOMY Right 2019   Polysomnogram  07/28/2019   Severe OSA w/hypoxia, mild central sleep apnea, CPAP recommended (Dr. Fransico Him)   RIGHT HEART CATH N/A 06/27/2019   Mild PAH. Procedure: RIGHT HEART CATH;  Surgeon: Jolaine Artist, MD;  Location: [facility];  Service: Cardiovascular;  Laterality: N/A;   SKIN BIOPSY  2018   Sarcoidosis of skin (also has pulm involvement).   SKIN GRAFT  2010  on left index finger   TRANSTHORACIC ECHOCARDIOGRAM  06/22/2019   NORMAL   Vocal cord surgery  2016   [facility]   WISDOM TOOTH EXTRACTION  2005    Outpatient Medications Prior to Visit  Medication Sig Dispense Refill   Azelastine-Fluticasone 137-50 MCG/ACT SUSP Place 2 sprays into both nostrils daily.     clobetasol ointment (TEMOVATE) 0.05 % Apply topically.     Clocortolone Pivalate (CLODERM) 0.1 % cream Apply 1 application topically 2 (two) times daily as needed (Sarcoidosis/rash).      folic acid (FOLVITE) 1 MG tablet Take 1 mg by mouth once a week.     hydrocortisone butyrate (LUCOID) 0.1 % CREA cream  Apply 1 application topically 2 (two) times daily. 60 g 6   hydroxychloroquine (PLAQUENIL) 200 MG tablet Take 400 mg by mouth daily.     hydrOXYzine (ATARAX/VISTARIL) 10 MG tablet TAKE 1 TO 3 TABLETS BY MOUTH EVERY 4 TO 6 HOURS AS NEEDED FOR ITCHING     levocetirizine (XYZAL) 5 MG tablet SMARTSIG:1 Tablet(s) By Mouth Every Evening     losartan (COZAAR) 50 MG tablet Take 1 tablet (50 mg total) by mouth daily. 90 tablet 3   metFORMIN (GLUCOPHAGE-XR) 500 MG 24 hr tablet Take 1 tablet (500 mg total) by mouth daily with breakfast. 30 tablet 6   methotrexate (RHEUMATREX) 2.5 MG tablet Take 15 mg by mouth once a week.     predniSONE (DELTASONE) 5 MG tablet Take 5 mg by mouth daily with breakfast.     spironolactone (ALDACTONE) 25 MG tablet Take 1 tablet (25 mg total) by mouth daily. 90 tablet 3   amLODipine (NORVASC) 10 MG tablet Take 1 tablet (10 mg total) by mouth daily. 90 tablet 3   azithromycin (ZITHROMAX) 250 MG tablet Take 1 tablet (250 mg total) by mouth as directed. (Patient not taking: Reported on 01/11/2021) 6 tablet 0   metoprolol succinate (TOPROL-XL) 25 MG 24 hr tablet Take 1.5 tablets (37.5 mg total) by mouth daily. (Patient taking differently: Take 25 mg by mouth daily.) 45 tablet 3   pantoprazole (PROTONIX) 40 MG tablet TAKE 1 TABLET BY MOUTH 30 TO 60 MINUTES BEFORE YOUR FIRST AND LAST MEALS OF THE DAY 180 tablet 1   predniSONE (DELTASONE) 10 MG tablet 4 x 2 days, 2 x 2 days, 1 x 2 days, 1/2 x 2 days and stop (Patient not taking: Reported on 01/11/2021) 15 tablet 0   No facility-administered medications prior to visit.    Allergies  Allergen Reactions   Wasp Venom Swelling    ROS As per HPI  PE: Vitals with BMI 01/11/2021 09/13/2020 09/06/2020  Height 6' 4" 6' 2" 6' 4"  Weight 288 lbs 6 oz 290 lbs 299 lbs 13 oz  BMI 35.12 11.91 47.82  Systolic 956 - 213  Diastolic 77 - 80  Pulse 85 - 100   Gen: Alert, well appearing.  Patient is oriented to person, place, time, and  situation. AFFECT: pleasant, lucid thought and speech. Nose: signif turbinate edema and injection bilat, no facial swelling or tenderness CV: RRR, no m/r/g.   LUNGS: CTA bilat, nonlabored resps, good aeration in all lung fields.   LABS:  Lab Results  Component Value Date   TSH 1.49 02/03/2020   Lab Results  Component Value Date   WBC 6.8 02/03/2020   HGB 14.7 02/03/2020   HCT 46.5 02/03/2020   MCV 70.3 (L) 02/03/2020   PLT 215.0 02/03/2020   Lab Results  Component Value Date   CREATININE 1.25 08/21/2020   BUN 16 08/21/2020   NA 136 08/21/2020   K 4.0 08/21/2020   CL 101 08/21/2020   CO2 29 08/21/2020   Lab Results  Component Value Date   ALT 17 08/21/2020   AST 17 08/21/2020   ALKPHOS 56 08/21/2020   BILITOT 0.5 08/21/2020   Lab Results  Component Value Date   CHOL 157 08/21/2020   Lab Results  Component Value Date   HDL 31.50 (L) 08/21/2020   Lab Results  Component Value Date   LDLCALC 105 (H) 08/21/2020   Lab Results  Component Value Date   TRIG 100.0 08/21/2020   Lab Results  Component Value Date   CHOLHDL 5 08/21/2020   Lab Results  Component Value Date   PSA 0.79 08/25/2014   Lab Results  Component Value Date   HGBA1C 6.0 08/21/2020    IMPRESSION AND PLAN:  1) HTN, well controlled. Cont metoprolol, adactone, losartan, and amlodipine. Lytes/cr today.  2) Prediabetes: better diet, has lost some wt. He has not been taking metformin after all---per his choice. Hba1c today.  3) Chronic rhinitis, with episodic marked increase in clear/watery nasal secretions causing PND. Will try adding ipratropium nasal spray bid prn when he gets these episodes. Cont daily dymista and zyrtec in the meantime  4) Pulm and cutaneous sarcoidosis: quiescent on methotrexate and hydroxychloroquine. Takes prednisone prn. CBC and cmet today.  An After Visit Summary was printed and given to the patient.  FOLLOW UP: Return in about 4 months (around 05/13/2021)  for annual CPE (fasting).  Signed:  Crissie Sickles, MD           01/11/2021

## 2021-01-21 ENCOUNTER — Telehealth: Payer: Self-pay | Admitting: Internal Medicine

## 2021-01-21 NOTE — Telephone Encounter (Signed)
Definitely get covid tsted. If antigen neg, should get PCR. IF positive, should call us for Rx advice  Otherwise is at some risk for sarcoid flare  Plan Take doxycycline 141m po twice daily x 5 days; take after meals and avoid sunlight  .Please take prednisone 40 mg x1 day, then 30 mg x1 day, then 20 mg x1 day, then 10 mg x1 day, and then 5 mg x1 day and stop   If worse go to ER      has a past medical history of Beta thalassemia minor, Chronic renal insufficiency, stage II (mild), Chronic rhinitis, Cutaneous sarcoidosis, Hoarseness (2015), HTN (hypertension), Hydrocele, LPRD (laryngopharyngeal reflux disease), Obesity, Class II, BMI 35-39.9, OSA (obstructive sleep apnea) (07/28/2019), Peripheral edema, Prediabetes (02/2017), [place]monary arterial hypertension ([place], [place]osis of lung [place] (12/2016), and Vocal cord polyps (2015).   reports that he quit smoking about 25 years ago. His smoking use included cigarettes. He has a 0.45 pack-year smoking history. He has never used smokeless tobacco.  Past Surgical History:  Procedure Laterality Date   OLECRANON BURSECTOMY Right 2019   Polysomnogram  07/28/2019   Severe OSA w/hypoxia, mild central sleep apnea, CPAP recommended (Dr. TFransico Him   RIGHT HEART CATH N/A 06/27/2019   Mild PAH. Procedure: RIGHT HEART CATH;  Surgeon: BJolaine Artist MD;  [facility];  Service: Cardiovascular;  Laterality: N/A;   SKIN BIOPSY  2018   Sarcoidosis of skin (also has pulm involvement).   SKIN GRAFT  2010   on left index finger   TRANSTHORACIC ECHOCARDIOGRAM  06/22/2019   NORMAL   [facility]   WISDOM TOOTH EXTRACTION  2005    Allergies  Allergen Reactions   Wasp Venom Swelling    Immunization History  Administered Date(s) Administered   Influenza,inj,Quad PF,6+ Mos 02/21/2020   Influenza-Unspecified 04/08/2019   PFIZER(Purple Top)SARS-COV-2 Vaccination 08/20/2019, 09/10/2019   Pneumococcal  Polysaccharide-23 08/01/2019   Tdap 04/20/2018    Family History  Problem Relation Age of Onset   Sarcoidosis Father        ?   Lactose intolerance Father    Other Mother        colon issues-portion of colon removed   Lactose intolerance Mother    Cancer Paternal Grandfather        ?     Current Outpatient Medications:    amLODipine (NORVASC) 10 MG tablet, Take 1 tablet (10 mg total) by mouth daily., Disp: 90 tablet, Rfl: 3   Azelastine-Fluticasone 137-50 MCG/ACT SUSP, Place 2 sprays into both nostrils daily., Disp: , Rfl:    clobetasol ointment (TEMOVATE) 0.05 %, Apply topically., Disp: , Rfl:    Clocortolone Pivalate (CLODERM) 0.1 % cream, Apply 1 application topically 2 (two) times daily as needed (Sarcoidosis/rash). , Disp: , Rfl:    folic acid (FOLVITE) 1 MG tablet, Take 1 mg by mouth once a week., Disp: , Rfl:    hydrocortisone butyrate (LUCOID) 0.1 % CREA cream, Apply 1 application topically 2 (two) times daily., Disp: 60 g, Rfl: 6   hydroxychloroquine (PLAQUENIL) 200 MG tablet, Take 400 mg by mouth daily., Disp: , Rfl:    hydrOXYzine (ATARAX/VISTARIL) 10 MG tablet, TAKE 1 TO 3 TABLETS BY MOUTH EVERY 4 TO 6 HOURS AS NEEDED FOR ITCHING, Disp: , Rfl:    ipratropium (ATROVENT) 0.03 % nasal spray, Place 2 sprays into both nostrils every 12 (twelve) hours., Disp: 30 mL, Rfl: 12   levocetirizine (XYZAL)  5 MG tablet, SMARTSIG:1 Tablet(s) By Mouth Every Evening, Disp: , Rfl:    losartan (COZAAR) 50 MG tablet, Take 1 tablet (50 mg total) by mouth daily., Disp: 90 tablet, Rfl: 3   metFORMIN (GLUCOPHAGE-XR) 500 MG 24 hr tablet, Take 1 tablet (500 mg total) by mouth daily with breakfast., Disp: 30 tablet, Rfl: 6   methotrexate (RHEUMATREX) 2.5 MG tablet, Take 15 mg by mouth once a week., Disp: , Rfl:    metoprolol succinate (TOPROL-XL) 25 MG 24 hr tablet, Take 1.5 tablets (37.5 mg total) by mouth daily., Disp: 45 tablet, Rfl: 3   pantoprazole (PROTONIX) 40 MG tablet, TAKE 1 TABLET BY MOUTH  30 TO 60 MINUTES BEFORE YOUR FIRST AND LAST MEALS OF THE DAY, Disp: 180 tablet, Rfl: 3   predniSONE (DELTASONE) 5 MG tablet, Take 5 mg by mouth daily with breakfast., Disp: , Rfl:    spironolactone (ALDACTONE) 25 MG tablet, Take 1 tablet (25 mg total) by mouth daily., Disp: 90 tablet, Rfl: 3

## 2021-01-21 NOTE — Telephone Encounter (Signed)
Pt stated that he is having diarrhea, more SOB, chest tightness, extreme fatigue, sometimes he has drainage states that it is not thick yet, productive cough (but its not thick with the mucus). Pt states that he is having stomach cramps states it feels like he has pneumonia again. States that he has an occasional fever and headaches  Pharmacy; [place], [place] - [address] AT [address]  [place], [place] [id number]   Pls regard; [phone]

## 2021-01-21 NOTE — Telephone Encounter (Signed)
I have called the pt again and LM on VM.  Advised the pt in the message to call the office in the morning or if he seems to get worse over night to seek emergency care.

## 2021-01-21 NOTE — Telephone Encounter (Signed)
Called spoke with patient He states he has been having chest tightness and drainage for about 2-3 weeks, last week had increased shortness of breath and increased fatigue. And 2-3 days ago started having headaches, occasional fevers and diarrhea.  Asked patient if he tested for covid he said no. I advised him to get tested while he was waiting on results. He states this is how he felt the last two times he had pneumonia and was going to wait for recommendations.   Sending to Dr. Chase Caller for recommendations.

## 2021-01-21 NOTE — Telephone Encounter (Signed)
ATC x1, LVM to return call on 01/22/21 for recommendations per Dr. Chase Caller as our phones are now off.

## 2021-01-22 MED ORDER — PREDNISONE 10 MG PO TABS: ORAL_TABLET | ORAL | 0 refills | Status: AC

## 2021-01-22 MED ORDER — DOXYCYCLINE HYCLATE 100 MG PO TABS: 100.0000 mg | ORAL_TABLET | Freq: Two times a day (BID) | ORAL | 0 refills | Status: DC

## 2021-01-22 NOTE — Telephone Encounter (Signed)
Called spoke with patient Let him know [name] recommendations. Patient is going to go to [facility] to get tested Patient verified pharmacy  Orders placed Nothing further needed at this time.

## 2021-02-22 ENCOUNTER — Other Ambulatory Visit: Payer: Self-pay

## 2021-02-22 ENCOUNTER — Ambulatory Visit (INDEPENDENT_AMBULATORY_CARE_PROVIDER_SITE_OTHER)
Admission: RE | Admit: 2021-02-22 | Discharge: 2021-02-22 | Disposition: A | Discharge status: Home / Self Care | Payer: BC Managed Care – PPO | Source: Ambulatory Visit | Attending: Internal Medicine | Admitting: Internal Medicine

## 2021-02-22 DIAGNOSIS — D869 Sarcoidosis, unspecified: Secondary | ICD-10-CM

## 2021-03-05 ENCOUNTER — Other Ambulatory Visit: Payer: Self-pay

## 2021-03-05 ENCOUNTER — Ambulatory Visit (INDEPENDENT_AMBULATORY_CARE_PROVIDER_SITE_OTHER): Payer: BC Managed Care – PPO | Admitting: Adult Health

## 2021-03-05 ENCOUNTER — Encounter: Payer: Self-pay | Admitting: Adult Health

## 2021-03-05 VITALS — BP 132/84 | HR 88 | Temp 98.2°F | Ht 74.0 in | Wt 285.8 lb

## 2021-03-05 DIAGNOSIS — D869 Sarcoidosis, unspecified: Secondary | ICD-10-CM | POA: Diagnosis not present

## 2021-03-05 DIAGNOSIS — G4733 Obstructive sleep apnea (adult) (pediatric): Secondary | ICD-10-CM

## 2021-03-05 DIAGNOSIS — R0981 Nasal congestion: Secondary | ICD-10-CM

## 2021-03-05 DIAGNOSIS — K219 Gastro-esophageal reflux disease without esophagitis: Secondary | ICD-10-CM

## 2021-03-05 DIAGNOSIS — J209 Acute bronchitis, unspecified: Secondary | ICD-10-CM | POA: Diagnosis not present

## 2021-03-05 MED ORDER — FOLIC ACID 1 MG PO TABS: 1.0000 mg | ORAL_TABLET | Freq: Every day | ORAL | 5 refills | Status: DC

## 2021-03-05 MED ORDER — PREDNISONE 10 MG PO TABS: ORAL_TABLET | ORAL | 1 refills | Status: DC

## 2021-03-05 MED ORDER — DOXYCYCLINE HYCLATE 100 MG PO TABS: 100.0000 mg | ORAL_TABLET | Freq: Two times a day (BID) | ORAL | 0 refills | Status: DC

## 2021-03-05 NOTE — Assessment & Plan Note (Signed)
Continue on GERD diet and PPI.

## 2021-03-05 NOTE — Assessment & Plan Note (Signed)
Acute bronchitis.  Patient with increased cough congestion and notable increased consolidation on CT scan.  We will treat with empiric antibiotics. Add Mucinex DM twice daily. On return visit consider adding in bronchodilator.  Plan  Patient Instructions  Restart Prednisone 30m daily for 2 weeks, 157mdaily -hold on this dose.  Begin Doxycycline 10037mwice daily  for 1 week.  Mucinex DM Twice daily  As needed  cough/congestion .  Restart Methotrexate and Folic Acid  Referral for follow up with Rheumatology .  Restart CPAP At bedtime  -wear all night .  Dymista 2 puffs daily  Nasal Saline gel At bedtime   Yearly eye exams  Follow up with Dr. RamChase Callerweeks and As needed   Please contact office for sooner follow up if symptoms do not improve or worsen or seek emergency care

## 2021-03-05 NOTE — Assessment & Plan Note (Signed)
Continue on Dymista.  Add in saline nasal gel at bedtime. Continue with daily nasal rinses

## 2021-03-05 NOTE — Patient Instructions (Addendum)
Restart Prednisone 7m daily for 2 weeks, 119mdaily -hold on this dose.  Begin Doxycycline 10063mwice daily  for 1 week.  Mucinex DM Twice daily  As needed  cough/congestion .  Restart Methotrexate and Folic Acid  Referral for follow up with Rheumatology .  Restart CPAP At bedtime  -wear all night .  Dymista 2 puffs daily  Nasal Saline gel At bedtime   Yearly eye exams  Follow up with Dr. RamChase Callerweeks and As needed   Please contact office for sooner follow up if symptoms do not improve or worsen or seek emergency care

## 2021-03-05 NOTE — Progress Notes (Signed)
_0  ID: Sean Sharp, male    DOB: 07/11/1975, 45 y.o.   MRN: [id number]  Chief Complaint  Patient presents with   Follow-up    Referring provider: Tammi Sou, MD  HPI: 45 year old male followed for sarcoidosis with skin and lung involvement Legionella pneumonia February 2021.  With residual pneumatocele right upper lobe.   TEST/EVENTS :  High-resolution CT chest February 22, 2021 very extensive fine nodularity throughout the lungs many of which are confluent in the consolidations along the pleural spaces in the fissures with an upper lobe predominance.  Architectural distortion of the right upper lobe.  Thin-walled pneumatocele in the right apex, cavitary masslike consolidation in the right perihilar lung.  Increased subpleural consolidation left lower lobe measuring 2.9 cm.  Increased consolidation in the left upper lobe measuring 3.5 cm.  Increased consolidation in the right lower lobe measuring 3.4 cm.  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  Enlarged coarsely calcified lymph nodes throughout the mediastinum and hilum consistent with nodal sarcoidosis.  PFT 08/2020 FEV1 62%, ratio 75, FVC 66%, postbronchodilator response , DLCO 72%   03/05/2021 Follow up : Sarcoidosis Patient returns for 6 months follow up.  Complains of increased cough, congestion ,thick yellow mucus , worse for last 2 month.  Feels his breathing has been getting worse.  Called in antibiotic 1 month ago for increased cough, but he did not pick up . Owns company, [employer]. Work Biochemist, clinical. Gets tired easily .  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  An extensive calcified lymphadenopathy.  Patient also has been complaining some increased GERD symptoms. Follows with rheumatology and dermatology.  He is supposed to be on on Plaquenil, Prednisone 5 mg daily,  methotrexate.Stopped prednisone  2 months ago, rx ran out . Has not been taking methotrexate , ran of folic acid .  We had a long discussion about med compliance and chronic disease management.  Patient also has not been to rheumatology recently he says that his normal provider that he follows with has no longer there.  We discussed new referral today to establish with new provider. Patient denies any hemoptysis, chest pain, orthopnea or increased edema.  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  Patient says this is also been worse especially along his face and neck area. Patient has severe obstructive sleep apnea.  Patient says he has been unable to tolerate CPAP.  He has chronic nasal congestion.We discussed the potential complications of untreated sleep apnea.  Patient is followed by cardiology for his sleep apnea.   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  Hemoglobin electrophoresis normal 2016.   Chronic renal insufficiency, stage II (mild)    Chronic rhinitis    Cutaneous sarcoidosis    [place] rheum: as of 02/2019->plaquenil.  Cont plaquenil and slowly tapering prednisone as of 06/2019 rheum f/u   Hoarseness 2015   [facility] 04/2014-normal laryngoscopy: prednisone taper and bid nexium recommended.  No signif help so pt got 2nd opinion at [facility]    HTN (hypertension)    Hydrocele    left  LPRD (laryngopharyngeal reflux disease)    Obesity, Class II, BMI 35-39.9    OSA (obstructive sleep apnea) 07/28/2019   SEVERE OSA, mild central sleep apnea, CPAP trial 08/2019 (Dr. Fransico Him)   Peripheral edema    left leg>right leg    Prediabetes 02/2017   A1c 6.3% (right when he was starting to take prednisone prn for treatment of his sarcoidosis).  HbA1c 08/2017=6.3%. A1c 01/2020 6.4%.   Pulmonary arterial hypertension (HCC)    Mild (R heart cath 06/27/19), echo 06/2019 normal.  Wt loss and CPAP recommended.   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 Melvyn Novas suspects it was smoldering since 2011.  Skin bx of cutaneous lesion confirmed dx of sarcoidosis 02/2017.  Pt improving fall 2018 with plaquenil and prednisone.  Pt self d/c'd plaquenil.   Vocal cord polyps 2015   surgery [facility]     Tobacco History: Social History   Tobacco Use  Smoking Status Former   Packs/day: 0.30   Years: 1.50   Pack years: 0.45   Types: Cigarettes   Quit date: 06/10/1995   Years since quitting: 25.7  Smokeless Tobacco Never   Counseling given: Not Answered   Outpatient Medications Prior to Visit  Medication Sig Dispense Refill   amLODipine (NORVASC) 10 MG tablet Take 1 tablet (10 mg total) by mouth daily. 90 tablet 3   Azelastine-Fluticasone 137-50 MCG/ACT SUSP Place 2 sprays into both nostrils daily.     clobetasol ointment (TEMOVATE) 0.05 % Apply topically.     Clocortolone Pivalate (CLODERM) 0.1 % cream Apply 1 application topically 2 (two) times daily as needed (Sarcoidosis/rash).      hydrocortisone butyrate (LUCOID) 0.1 % CREA cream Apply 1 application topically 2 (two) times daily. 60 g 6   hydroxychloroquine (PLAQUENIL) 200 MG tablet Take 400 mg by mouth daily.     hydrOXYzine (ATARAX/VISTARIL) 10 MG tablet TAKE 1 TO 3 TABLETS BY MOUTH EVERY 4 TO 6 HOURS AS NEEDED FOR ITCHING     ipratropium (ATROVENT) 0.03 % nasal spray Place 2 sprays into both nostrils every 12 (twelve) hours. 30 mL 12   levocetirizine (XYZAL) 5 MG tablet SMARTSIG:1 Tablet(s) By Mouth Every Evening     losartan (COZAAR) 50 MG tablet Take 1 tablet (50 mg total) by mouth daily. 90 tablet 3   metFORMIN (GLUCOPHAGE-XR) 500 MG 24 hr tablet Take 1 tablet (500 mg  total) by mouth daily with breakfast. 30 tablet 6   methotrexate (RHEUMATREX) 2.5 MG tablet Take 15 mg by mouth once a week.     pantoprazole (PROTONIX) 40 MG tablet TAKE 1 TABLET BY MOUTH 30 TO 60 MINUTES BEFORE YOUR FIRST AND LAST MEALS OF THE DAY 180 tablet 3   predniSONE (DELTASONE) 5 MG tablet Take 5 mg by mouth daily with breakfast.     spironolactone (ALDACTONE) 25 MG tablet Take 1 tablet (25 mg total) by mouth daily. 90 tablet 3   doxycycline (VIBRA-TABS) 100 MG tablet Take 1 tablet (100 mg total) by mouth 2 (two) times daily. 10 tablet 0   folic acid (FOLVITE) 1 MG tablet Take 1 mg by mouth once a week.     metoprolol succinate (TOPROL-XL) 25 MG 24 hr tablet Take 1.5 tablets (37.5 mg total) by mouth daily. 45 tablet 3   No facility-administered medications prior to visit.     Review of Systems:   Constitutional:   No  weight loss, night sweats,  Fevers, chills, + fatigue, or  lassitude.  HEENT:   No headaches,  Difficulty swallowing,  Tooth/dental problems, or  Sore throat,                No sneezing, itching, ear ache,  +nasal congestion, post nasal drip,   CV:  No chest pain,  Orthopnea, PND, swelling in lower extremities, anasarca, dizziness, palpitations, syncope.   GI  No abdominal pain, nausea, vomiting, diarrhea, change in bowel habits, loss of appetite, bloody stools.   Resp: .  No chest wall deformity  Skin: no rash or lesions.  GU: no dysuria, change in color of urine, no urgency or frequency.  No flank pain, no hematuria   MS:  No joint pain or swelling.  No decreased range of motion.  No back pain.    Physical Exam  BP 132/84 (BP Location: Left Arm, Patient Position: Sitting, Cuff Size: Large)   Pulse 88   Temp 98.2 F (36.8 C) (Oral)   Ht _0  (1.88 m)   Wt 285 lb 12.8 oz (129.6 kg)   SpO2 96%   BMI 36.69 kg/m   GEN: A/Ox3; pleasant , NAD, well nourished    HEENT:  [place]/AT,  NOSE-noted nasal congestion and turbinate edema.  THROAT-clear, no  lesions, no postnasal drip or exudate noted.   NECK:  Supple w/ fair ROM; no JVD; normal carotid impulses w/o bruits; no thyromegaly or nodules palpated; no lymphadenopathy.    RESP  Clear  P & A; w/o, wheezes/ rales/ or rhonchi. no accessory muscle use, no dullness to percussion  CARD:  RRR, no m/r/g, no peripheral edema, pulses intact, no cyanosis or clubbing.  GI:   Soft & nt; nml bowel sounds; no organomegaly or masses detected.   Musco: Warm bil, no deformities or joint swelling noted.   Neuro: alert, no focal deficits noted.    Skin: Warm, no lesions or rashes    Lab Results:  CBC    Component Value Date/Time   WBC 7.6 01/11/2021 1037   RBC 6.42 (H) 01/11/2021 1037   HGB 14.0 01/11/2021 1037   HCT 44.9 01/11/2021 1037   PLT 246.0 01/11/2021 1037   MCV 69.9 Repeated and verified X2. (L) 01/11/2021 1037   MCH 21.4 (L) 07/21/2019 0406   MCHC 31.3 01/11/2021 1037   RDW 17.1 (H) 01/11/2021 1037   LYMPHSABS 0.3 (L) 01/11/2021 1037   MONOABS 0.6 01/11/2021 1037   EOSABS 0.1 01/11/2021 1037   BASOSABS 0.0 01/11/2021 1037    BMET    Component Value Date/Time   NA 136 01/11/2021 1037   NA 139 10/14/2019 0000   K 4.5 01/11/2021 1037   CL 102 01/11/2021 1037   CO2 24 01/11/2021 1037   GLUCOSE 96 01/11/2021 1037   BUN 14 01/11/2021 1037   BUN 14 10/14/2019 0000   CREATININE 1.15 01/11/2021 1037   CREATININE 1.43 (H) 02/28/2016 0720   CALCIUM 9.4 01/11/2021 1037   GFRNONAA 79 10/14/2019 0000   GFRAA 91 10/14/2019 0000    BNP No results found for: BNP  ProBNP No results found for: PROBNP  Imaging: CT Chest High Resolution  Result Date: 02/24/2021 CLINICAL DATA:  Sarcoidosis EXAM: CT CHEST WITHOUT CONTRAST TECHNIQUE: Multidetector CT imaging of the chest was performed following the standard protocol without intravenous contrast. High resolution imaging of the lungs, as well as inspiratory and expiratory imaging, was performed. COMPARISON:  02/28/2020,  07/16/2019, 03/16/2019, 01/05/2017, 04/19/2010 FINDINGS: Cardiovascular: No significant vascular findings. Normal heart size. No pericardial effusion. Enlargement of the  main pulmonary artery, measuring up to 3.9 cm in caliber. Mediastinum/Nodes: Enlarged, coarsely calcified lymph nodes throughout the mediastinum and hila. Thyroid gland, trachea, and esophagus demonstrate no significant findings. Lungs/Pleura: Redemonstrated very extensive fine nodularity throughout the lungs, many of which are confluent in consolidations and concentrated along the pleural surfaces in fissures and with an upper lobe predominance. Extensive architectural distortion, particularly of the right upper lobe. No significant air trapping on expiratory phase imaging. 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 No significant change in a septated, cavitary, masslike consolidation of the perihilar right lung (series 10, image 141). There is significantly increased subpleural consolidation of the peripheral left lower lobe, largest cross-section measuring 2.9 x 1.6 cm (series 10, image 211). Increased consolidation posteriorly in the left upper lobe abutting the fissure, largest cross-sectional area measuring 3.5 x 2.2 cm (series 10, image 90). Increased consolidation peripherally in the superior segment right lower lobe, greatest cross-sectional area measuring 3.4 x 2.2 cm (series 10, image 171). No pleural effusion or pneumothorax. Upper Abdomen: No acute abnormality. Musculoskeletal: No chest wall mass or suspicious bone lesions identified. IMPRESSION: 1. Redemonstrated very extensive fine nodularity throughout the lungs, many confluent in consolidations and concentrated along the pleural surfaces and fissures and with an upper lobe predominance. Architectural distortion, particularly of the right upper lobe. No significant change in a septated, cavitary, masslike consolidation of the perihilar right lung. Multiple areas of  increased consolidation in the bilateral lungs. 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 2. Enlarged, coarsely calcified lymph nodes throughout the mediastinum and hila, consistent with nodal sarcoidosis. 3. Enlargement of the main pulmonary artery, measuring up to 3.9 cm in caliber, as can be seen in pulmonary hypertension. Electronically Signed   By: Eddie Candle M.D.   On: 02/24/2021 09:28      PFT Results Latest Ref Rng & Units 09/06/2020 07/24/2020 04/05/2020  FVC-Pre L 3.04 3.04 3.30  FVC-Predicted Pre % 61 61 66  FVC-Post L 3.29 3.29 3.38  FVC-Predicted Post % 66 66 68  Pre FEV1/FVC % % 70 70 75  Post FEV1/FCV % % 75 75 77  FEV1-Pre L 2.13 2.13 2.48  FEV1-Predicted Pre % 53 53 62  FEV1-Post L 2.48 2.48 2.60  DLCO uncorrected ml/min/mmHg 24.55 24.55 25.13  DLCO UNC% % 72 72 73  DLCO corrected ml/min/mmHg 24.55 24.55 25.13  DLCO COR %Predicted % 72 72 73  DLVA Predicted % 140 140 121  TLC L 4.72 4.72 4.87  TLC % Predicted % 60 60 62  RV % Predicted % 73 73 45    Lab Results  Component Value Date   NITRICOXIDE 18 12/11/2016        Assessment & Plan:   Sarcoidosis with skin and lung involvement  Severe sarcoidosis with skin and pulmonary involvement.  CT chest this month shows progressive worsening of his sarcoidosis.  Patient has been off of methotrexate and prednisone x2 months.  We had a long discussion regarding med compliance.  Have advised him to restart steroids and methotrexate.  We will refill his folic acid.  We will begin prednisone at 20 mg for the next 2 weeks and then maintain 10 mg until office visit.  Also continue with yearly ophthalmology visits. Would like for him to reestablish with rheumatology.  And continue follow-up with dermatology. Will need PFTs in 3 months.  Plan  Patient Instructions  Restart Prednisone 41m daily for 2 weeks, 187mdaily -hold on this  dose.  Begin Doxycycline 131m Twice  daily  for 1 week.  Mucinex DM Twice daily  As needed  cough/congestion .  Restart Methotrexate and Folic Acid  Referral for follow up with Rheumatology .  Restart CPAP At bedtime  -wear all night .  Dymista 2 puffs daily  Nasal Saline gel At bedtime   Yearly eye exams  Follow up with Dr. RChase Caller4 weeks and As needed   Please contact office for sooner follow up if symptoms do not improve or worsen or seek emergency care          Nasal congestion Continue on Dymista.  Add in saline nasal gel at bedtime. Continue with daily nasal rinses  GERD Continue on GERD diet and PPI.  Acute bronchitis Acute bronchitis.  Patient with increased cough congestion and notable increased consolidation on CT scan.  We will treat with empiric antibiotics. Add Mucinex DM twice daily. On return visit consider adding in bronchodilator.  Plan  Patient Instructions  Restart Prednisone 255mdaily for 2 weeks, 1017maily -hold on this dose.  Begin Doxycycline 100m54mice daily  for 1 week.  Mucinex DM Twice daily  As needed  cough/congestion .  Restart Methotrexate and Folic Acid  Referral for follow up with Rheumatology .  Restart CPAP At bedtime  -wear all night .  Dymista 2 puffs daily  Nasal Saline gel At bedtime   Yearly eye exams  Follow up with Dr. RamaChase Callereeks and As needed   Please contact office for sooner follow up if symptoms do not improve or worsen or seek emergency care          OSA (obstructive sleep apnea) Patient has severe obstructive sleep apnea.  Have encouraged him on CPAP compliance.  Continue follow-up with cardiology for CPAP management.    I spent  43  minutes dedicated to the care of this patient on the date of this encounter to include pre-visit review of records, face-to-face time with the patient discussing conditions above, post visit ordering of testing, clinical documentation with the electronic health record, making appropriate referrals as  documented, and communicating necessary findings to members of the patients care team.   TammRexene Edison 03/05/2021

## 2021-03-05 NOTE — Assessment & Plan Note (Signed)
Severe sarcoidosis with skin and pulmonary involvement.  CT chest this month shows progressive worsening of his sarcoidosis.  Patient has been off of methotrexate and prednisone x2 months.  We had a long discussion regarding med compliance.  Have advised him to restart steroids and methotrexate.  We will refill his folic acid.  We will begin prednisone at 20 mg for the next 2 weeks and then maintain 10 mg until office visit.  Also continue with yearly ophthalmology visits. Would like for him to reestablish with rheumatology.  And continue follow-up with dermatology. Will need PFTs in 3 months.  Plan  Patient Instructions  Restart Prednisone 70m daily for 2 weeks, 127mdaily -hold on this dose.  Begin Doxycycline 10061mwice daily  for 1 week.  Mucinex DM Twice daily  As needed  cough/congestion .  Restart Methotrexate and Folic Acid  Referral for follow up with Rheumatology .  Restart CPAP At bedtime  -wear all night .  Dymista 2 puffs daily  Nasal Saline gel At bedtime   Yearly eye exams  Follow up with Dr. RamChase Callerweeks and As needed   Please contact office for sooner follow up if symptoms do not improve or worsen or seek emergency care

## 2021-03-05 NOTE — Assessment & Plan Note (Signed)
Patient has severe obstructive sleep apnea.  Have encouraged him on CPAP compliance.  Continue follow-up with cardiology for CPAP management.

## 2021-03-22 ENCOUNTER — Other Ambulatory Visit (HOSPITAL_COMMUNITY): Payer: Self-pay

## 2021-03-22 MED ORDER — LOSARTAN POTASSIUM 50 MG PO TABS: 50.0000 mg | ORAL_TABLET | Freq: Every day | ORAL | 0 refills | Status: DC

## 2021-04-02 ENCOUNTER — Other Ambulatory Visit: Payer: Self-pay

## 2021-04-02 ENCOUNTER — Ambulatory Visit (INDEPENDENT_AMBULATORY_CARE_PROVIDER_SITE_OTHER): Payer: BC Managed Care – PPO | Admitting: Internal Medicine

## 2021-04-02 ENCOUNTER — Encounter: Payer: Self-pay | Admitting: Internal Medicine

## 2021-04-02 VITALS — BP 126/80 | HR 75 | Temp 98.2°F | Ht 74.0 in | Wt 291.0 lb

## 2021-04-02 DIAGNOSIS — Z23 Encounter for immunization: Secondary | ICD-10-CM

## 2021-04-02 DIAGNOSIS — Z7952 Long term (current) use of systemic steroids: Secondary | ICD-10-CM | POA: Diagnosis not present

## 2021-04-02 DIAGNOSIS — D869 Sarcoidosis, unspecified: Secondary | ICD-10-CM | POA: Diagnosis not present

## 2021-04-02 DIAGNOSIS — D849 Immunodeficiency, unspecified: Secondary | ICD-10-CM

## 2021-04-02 MED ORDER — FOLIC ACID 1 MG PO TABS: 1.0000 mg | ORAL_TABLET | Freq: Every day | ORAL | 5 refills | Status: AC

## 2021-04-02 NOTE — Progress Notes (Signed)
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   1997 s sequelae 2006  With exp to foam bad cough resolved w/in 3 days of avoidance and did fine until 2011 really bad cold > chronic cough eval by Dr Joya Gaskins 04/2010 with suspicion for sarcoid (pos in father) but CT with only nonspecific adenopathy  waxes and wanes since then with extensive w/u / rx at [facility] voice center (reviewed in care everywhere)  And much  worse x 09/2016 assoc with hb/ worse with certain foods and some better dymista self referred to pulmonary clinic 12/11/2016    History of Present Illness  12/11/2016 1st [facility] office visit/ W[facility]   Chief Complaint  Patient presents with   Pulmonary Consult    Self referral. Pt c/o increased cough and SOB over the past 3 months. He states his cough is not really productive. It never bothers him at night. He sometimes coughs until the point he feels lightheaded, and has "passed out" once before due to cough.    cough x 6 years  Citric drinks make it worse  Allergy eval [facility] allergy  nexium as walking the door     Kouffman Reflux v Neurogenic Cough Differentiator Reflux Comments  Do you awaken from a sound sleep coughing violently?                            With trouble breathing? No   Do you have choking episodes when you cannot  Get enough air, gasping for air ?              Yes   Do you usually cough when you lie down into  The bed, or when you just lie down to rest ?                          Sometimes depending on what eaten   Do you usually cough after meals or eating?         Yes   Do you cough when (or after) you bend over?    Yes if full   GERD SCORE     Kouffman Reflux v Neurogenic Cough Differentiator Neurogenic   Do you more-or-less cough all day long? Sporadically    Does change of temperature make you cough? Not much   Does laughing or chuckling cause you to cough? yes   Do fumes (perfume, automobile fumes, burned  Toast, etc.,) cause  you to cough ?      Can, esp cooking on grill   Does speaking, singing, or talking on the phone cause you to cough   ?               No    Neurogenic/Airway score      rec Protonix (pantoprazole) 40 mg  Take 30- 60 min before your first and last meals of the day  Continue dymista one twice daily - point toward ear on same side For drainage / throat tickle as need  >>>   take CHLORPHENIRAMINE  4 mg - take one every 4 hours as needed -  For cough >>>   tessilon 200 mg three to four times daily  GERD (REFLUX)   Please remember to go to the x-ray department downstairs in the basement  for your tests - we will call you with the results when they are available. If  not better at 6 weeks you need to return to [facility] - if better then ok to refill meds thru your PCP or return here at 3 months to regroup re need for longterm treatment options - late add:  ? pna in rll/ ? Sarcoid changes also rec:  Return for esr/ cmet/ angiotensin, cbc with diff and rx Augmentin 875 mg take one pill twice daily  X 10 daysAnd  Prednisone 10 mg take  4 each am x 2 days,   2 each am x 2 days,  1 each am x 2 days and stop  And f/u with cxr in 2 weeks =  No change 12/29/16  - Angiotensin  12/18/16   138 with esr 44  - CT 01/05/2017   Extensive partially calcified mediastinal and bilateral hilar lymphadenopathy with extensive perilymphatic nodularity throughout the lungs bilaterally (right greater than left). This spectrum of imaging findings is compatible with the clinically suspected sarcoidosis.       01/06/2017  f/u ov/Wert re: probable sarcoid / ?uacs on gerd rx  Chief Complaint  Patient presents with   Follow-up    Pt hwew to discuss CT scan, He still has occ. sob,he is still coughing which causes him some dizziness,   rash has evolved over several years worse area R neck attributed to reaction to otc cream but noted over other parts of face where no cream was used  Cough is better but still requires rewwq  tessilon All symptoms a lot better (x for the rash) while on short courses of prednisone New problem is painful swelling R elbow s trauma hx  rec Continue protonix and dymista as you are If cough or breathing worse, take prednisone 10 mg x 2 until better then 1 daily x one week then stop  We will call you with appt to see dermatology and orthopedics    - Skin bx 01/09/17 :   Pos granulomatous dermatitis > rec 01/21/2017 start plaquenil 200 mg daily    03/10/2017  f/u ov/Wert re:  Plaquenil x 01/21/17 @ 200 mg daily  Chief Complaint  Patient presents with   Follow-up    Pt states he occ has a "strange sensation" in the right side of his chest. He states he feels tired most of the time, not much energy.    cough x onset 2011 then worse 09/2016 > resolved p prednisone and stayed gone p starting plaquenil as above Skin changes x 2016 also improving on plaquenil and steroid cream per derm  No need for prednisone since starting plaquenil  rec No change plaquenil dose for now  Please schedule a follow up office visit in 6 weeks, call sooner if needed with cxr on return  - consider taper off gerd rx if cough stays gone at next ov      02/25/2018  Pulmonary consultation: Wert re: sarcoidosis on "prn prednisone/ did not maintain on plaquenil / needs clearance for R olecranon bursitis Chief Complaint  Patient presents with   Advice Only    Needing pulm clearance for right elbow surgery. He has occ cough- non prod.     Not limited by breathing from desired activities   Min daytime dry cough attributes to pnds / some better on dymista when can afford it, does not recall resp to 1st gen H1 blockers per guidelines   No ocular complaints  Sleeps flat ok  rec If rash worse > return to dermatology  If nasal symptoms worse > ent  See your eye doctor yearly    televist 09/06/18 History of Present Illness: 09/06/2018  f/u ov/Wert re: sarcoidosis / skin involvement/rheumatology  rec plaqueninl 200 x  2 rec prednisone 5 mg  Per Ursula Alert x 20 mg x 4 days   Dyspnea:  Not limited by breathing from desired activities  / work out fine Cough: none on dymista /pantoprazole Sleeping: sleep flat  SABA use: none 02: none  rec Since your cough is better on dysmista and protonix and does not flare when you have perceive the sarcoid is getting worse it is unlikely related to your sarcoidosis so continue the dysmista/protonix  Ok to use protonix Take 30-60 min before first meal of the day but at onset of any cough you need to remember to Take 30- 60 min before your first and last meals of the day until cough is gone again I refilled your protonix today for the next 6 months but after that you will need to return here or see your PCP for this (as it's not really a pulmonary issue)  Take prednisone and plaquenil at your rheumatologists direction - the goal for plaquenil is to eventually wean you off of all prednisone.  Once the carona virus restrictions are listed we need to see you back here for PFT's/ ov so let's set this up for 6 months from now     12/08/2018  f/u ov/Wert re:  Sarcoidosis on plaquenil 200 x 2, no pred x months f/u by rheum Chief Complaint  Patient presents with   Follow-up    No co's today   Dyspnea:  Good ex tol Cough: betterp dymista  Sleeping: ok SABA use: none  02: none    No obvious day to day or daytime variability or assoc excess/ purulent sputum or mucus plugs or hemoptysis or cp or chest tightness, subjective wheeze or overt sinus or hb symptoms.   Sleeping  without nocturnal  or early am exacerbation  of respiratory  c/o's or need for noct saba. Also denies any obvious fluctuation of symptoms with weather or environmental changes or other aggravating or alleviating factors except as outlined above   No unusual exposure hx or h/o childhood pna/ asthma or knowledge of premature birth.            OV 02/21/2020 -transfer of care from Dr. Christinia Gully to Dr.  Chase Caller.  History is provided by the patient and review of the chart.  Subjective:  Patient ID: Sean Sharp, male , DOB: Nov 03, 1975 , age 72 y.o. , MRN: [id number] , ADDRESS: [address] [place] [address]   02/21/2020 -   Chief Complaint  Patient presents with   Follow-up    no problems     HPI Sean Sharp 45 y.o. 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  Initially seen by Dr. Asencion Noble and then subsequently by Dr. Christinia Gully.  He says early on he was only treated with as needed prednisone with each course lasting approximately 1 month.  He does not remember being on chronic prednisone.  He has never been on immunomodulators.  He is always had a chronic cough which is a piece together was deemed as secondary to sinus issues also irritable larynx syndrome per review of the chart.  It appears a few years ago a chest x-ray was done and it showed significant pulmonary sarcoidosis.  At this point  in time he had a skin biopsy of chronic lesions on his face and this was in 2018 or so which showed features consistent with sarcoid.  After that somewhere along the way he got established with rheumatology.  He has been recommended to continue to see pulmonary.  Then in February 2021 he was admitted for septic emboli and cavitary pneumonia.  According to the discharge summary no specific diagnosis for the pneumonia is indicated but review of the labs indicate Legionella urine antigen positivity. Visualization of the CT scan shows significant worsening in consolidation particularly in the right upper lobe compared to October 2020.  After this somewhere along the way he went on chronic prednisone.  After going on chronic prednisone multiple symptoms such as excessive daytime somnolence and fatigue have improved although not fully resolved.  He is known to have sleep apnea and the results  of below although he is not seeing a sleep specialist and is not using his CPAP even though has been advised to use 1.  In the past has had cough syncope and also excessive daytime somnolence and fatigue.  At this point in time his overall health status is somewhat better but he still has some residual dyspnea.  He denies any features of collagen vascular disease  Recent evaluation shows mild pulmonary venous hypertension, sleep apnea, significantly abnormal CT scan and elevated ACE level  In his February 2021 admission he was indeterminate for QuantiFERON gold.  His Covid was negative HIV negative cryptococcal antigen titers negative.  However he was Legionella positive    He reports he wants to get to the bottom of his problems    No flowsheet data found.   Ref Range & Units 7 mo ago  QuantiFERON Incubation  Incubation performed.   QuantiFERON-TB Gold Plus Negative Indeterminate    Results for TANVEER, BRAMMER (MRN [id number]) as of 02/21/2020 16:21  Ref. Range 07/17/2019 16:56  Strep Pneumo Urinary Antigen Latest Ref Range: NEGATIVE  NEGATIVE  L. pneumophila Serogp 1 Ur Ag Latest Ref Range: Negative  Positive (A)  Results for AMORI, COLOMB (MRN [id number]) as of 02/21/2020 16:21  - per HPI  Right heart catheterization January 2021 by Dr. Haroldine Laws  Findings:   RA = 9 RV = 36/12 PA = 38/6 (24) PCW = 11 Fick cardiac output/index = 7.1/2.7 PVR = 1.9 WU FA sat = 98% PA sat = 72%, 75% SVC sat = 75%   Assessment:   1. Very mild PAH with normal PVR   Plan/Discussion:   Recommend weight loss and CPAP.    Glori Bickers, MD  8:31 AM   Sleep study March 2021 interpreted by Dr. Golden Hurter -was a CPAP titration study  - An optimal PAP pressure was selected for this patient ( 18 cm of water) - Central sleep apnea was not noted during this titration (CAI = 0.2/h). - Severe oxygen desaturations were observed during this titration (min O2 = 80.0%). - No snoring was  audible during this study. - No cardiac abnormalities were observed during this study. - Clinically significant periodic limb movements were not noted during this study. Arousals associated with PLMs were rare.  Trial of CPAP therapy on 18 cm H2O with a Medium size Resmed Full Face Mask AirFit F20 mask and heated humidification -please not using it   IMPRESSION: CT CHEST FEB 2021 - personally visualized and when compared to October 2020 it is worse -this admission February 2021 labeled as septic emboli and community-acquired pneumonia with cavitary  pneumonia. 1. Unfortunately, there is limited opacification of the pulmonary arteries. There is no evidence of large central pulmonary embolus in the main pulmonary artery or main portions of the right and left pulmonary arteries. However, smaller emboli in the more peripheral branches of the pulmonary arteries cannot be excluded on the basis of this exam. 2. Stable calcified adenopathy is noted consistent with history of sarcoidosis. 3. Interval development of multiple rounded ill-defined opacities throughout both lungs concerning for multifocal pneumonia or septic emboli. The largest measures 9.7 x 8.6 cm in the right lung apex. Cavitary abnormality measuring 5.0 x 4.3 cm is noted in the left lower lobe posteriorly most consistent with cavitary pneumonia. 1.9 cm cavitary abnormality is also noted in the left lower lobe.     Electronically Signed   By: Marijo Conception M.D.   On: 07/16/2019 13:42   Results for SOLMON, BOHR (MRN [id number]) as of 02/21/2020 12:09  Ref. Range 06/21/2010 20:05 12/18/2016 09:35 07/18/2019 11:09  IgE (Immunoglobulin E), Serum Latest Ref Range: 0.0 - 180.0 intl units/mL 27.6    Angiotensin-Converting Enzyme Latest Ref Range: 9 - 67 U/L  138 (H)   Cytoplasmic (C-ANCA) Latest Ref Range: Neg:<1:20 titer   <1:20  P-ANCA Latest Ref Range: Neg:<1:20 titer   <1:20  Atypical P-ANCA titer Latest Ref Range: Neg:<1:20  titer   <1:20   Results for TEDDRICK, MALLARI (MRN [id number]) as of 02/21/2020 16:21  Ref. Range 10/14/2019 00:00 02/03/2020 09:27  Creatinine Latest Ref Range: 0.40 - 1.50 mg/dL 1.1 1.30  Results for KEONTAE, LEVINGSTON (MRN [id number]) as of 02/21/2020 16:21  Ref. Range 07/20/2019 03:45 07/21/2019 04:06 08/01/2019 09:45 10/14/2019 00:00 02/03/2020 09:27  Hemoglobin Latest Ref Range: 13.0 - 17.0 g/dL 12.3 (L) 12.6 (L) 12.3 (L) 14.6 14.7      OV 04/05/2020  Subjective:  Patient ID: Sean Sharp, male , DOB: February 28, 1976 , age 64 y.o. , MRN: [id number] , ADDRESS: [address] [place] [address] PCP McGowen, Adrian Blackwater, MD Patient Care Team: Tammi Sou, MD as PCP - General (Family Medicine) Ruby Cola, MD as Consulting Physician (Otolaryngology) Chyrel Masson, DO as Consulting Physician (Otolaryngology) Harold Hedge, Darrick Grinder, MD as Consulting Physician (Allergy and Immunology) Tanda Rockers, MD as Consulting Physician (Pulmonary Disease) Marchia Bond, MD as Consulting Physician (Orthopedic Surgery) Rosita Kea, PA-C as Consulting Physician (Rheumatology) Bensimhon, Shaune Pascal, MD as Consulting Physician (Cardiology)  This Provider for this visit: Treatment Team:  Attending Provider: Brand Males, MD    04/05/2020 -   Chief Complaint  Patient presents with   Follow-up    PFT performed today. Pt states he has been doing well since last visit and denies any complaints.     HPI Sean Sharp 45 y.o. - returns for follow-up.  At this point in time he is minimally symptomatic.  He still continues on his prednisone 7 spring of this year.  He says he has had his Covid vaccine but is in need of a booster.  He is yet to see his sleep specialist.  He had pulmonary function test that shows mild restriction to moderate restriction.  But he is essentially asymptomatic from a respiratory standpoint.  He had follow-up CT scan of the chest that shows improvement and resolution  of the Legionella pneumonia that he suffered in February 2021.  However in comparing to old CT scans of the chest - he did NOT have sarcoidosis in 2011 but was present in 2018 and oct 2020.  In both seemed inflammatory but now Oct 2021 seems fibrotic      PFT Results Latest Ref Rng & Units 04/05/2020  FVC-Pre L 3.30  FVC-Predicted Pre % 66  FVC-Post L 3.38  FVC-Predicted Post % 68  Pre FEV1/FVC % % 75  Post FEV1/FCV % % 77  FEV1-Pre L 2.48  FEV1-Predicted Pre % 62  FEV1-Post L 2.60  DLCO uncorrected ml/min/mmHg 25.13  DLCO UNC% % 73  DLCO corrected ml/min/mmHg 25.13  DLCO COR %Predicted % 73  DLVA Predicted % 121  TLC L 4.87  TLC % Predicted % 62  RV % Predicted % 45     CT chest high resolution Sept 2021  IMPRESSION: 1. Resolved bilateral multi lobar pneumonia. Residual postinfectious pneumatocele in the right upper lobe. 2. Extensive pulmonary parenchymal findings of sarcoidosis are stable in the interval since 07/16/2019 as detailed. 3. Stable calcified mediastinal and bilateral hilar adenopathy, compatible with sarcoidosis. 4. One vessel coronary atherosclerosis. 5. Symmetric mild bilateral gynecomastia, worsened.     Electronically Signed   By: Ilona Sorrel M.D.   On: 02/28/2020 16:32    ROS - per HPI     has a past medical history of Beta thalassemia minor, Chronic renal insufficiency, stage II (mild), Chronic rhinitis, Cutaneous sarcoidosis, Hoarseness (2015), HTN (hypertension), Hydrocele, LPRD (laryngopharyngeal reflux disease), Obesity, Class II, BMI 35-39.9, OSA (obstructive sleep apnea) (07/28/2019), Peripheral edema, Prediabetes (02/2017), Pulmonary arterial hypertension ([place]), Sarcoidosis of lung ([place]) (12/2016), and Vocal cord polyps (2015).   OV 09/06/2020  Subjective:  Patient ID: Sean Sharp, male , DOB: 1975-12-06 , age 2 y.o. , MRN: [id number] , ADDRESS: [address] [place] [address] PCP McGowen, Adrian Blackwater, MD Patient Care  Team: Tammi Sou, MD as PCP - General (Family Medicine) Sueanne Margarita, MD as PCP - Sleep Medicine (Cardiology) Ruby Cola, MD as Consulting Physician (Otolaryngology) Chyrel Masson, DO as Consulting Physician (Otolaryngology) Harold Hedge, Darrick Grinder, MD as Consulting Physician (Allergy and Immunology) Tanda Rockers, MD as Consulting Physician (Pulmonary Disease) Marchia Bond, MD as Consulting Physician (Orthopedic Surgery) Rosita Kea, PA-C as Consulting Physician (Rheumatology) Bensimhon, Shaune Pascal, MD as Consulting Physician (Cardiology)  This Provider for this visit: Treatment Team:  Attending Provider: Brand Males, MD    09/06/2020 -   Chief Complaint  Patient presents with   Follow-up    Doing ok, felt CBD oil helped with SOB  #SARCOID - he did NOT have sarcoidosis in 2011 but was present in 2018 and oct 2020. In both seemed inflammatory but now se/pOct 2021 seems fibrotic (per radiology: oct 2020 -> Sept 2021 there is very mild worsening of scar tissue esp in the right upper side and this could just represent the scar from the lgionella pneumoa in feb t)  - on monitoring approach  # Legionella pneumonia that he suffered in February 2021.  ->residual pneumatociele Sept/Oct 2021 CT    #Lupus  HPI Sean Sharp 45 y.o. -returns for follow-up.  Last visit was in the fall 2021.  At that time there is some concern about whether he was having progressive burned-out fibrotic lung disease from his sarcoid.  Radiology given opinion that he might be having that.  So we decided to get a pulmonary function test.  Today's pulmonary function test shows slight decline.  But the range is extremely small.  Meanwhile he tells me this is lupus Perni is active the last few to several weeks his lupus pernio on the  face and forearms have gotten worse.  Also worse in the shin possibly.  He saw rheumatology Marella Chimes.  Some 3 weeks ago was placed on methotrexate 15 mg once a  week.  Today he also increased his prednisone 5 5 mg to 40 mg because of his arthropathy and skin lesions.  He has upcoming appointment with dermatology in the middle of April 2022.  But from a breathing standpoint he is better/stable.  He said he took CBD oil and this helped him.  He plans to go back to it.      OV 04/02/2021  Subjective:  Patient ID: Sean Sharp, male , DOB: 1975/11/02 , age 17 y.o. , MRN: [id number] , ADDRESS: [address] [place] [address] PCP McGowen, Adrian Blackwater, MD Patient Care Team: Tammi Sou, MD as PCP - General (Family Medicine) Sueanne Margarita, MD as PCP - Sleep Medicine (Cardiology) Ruby Cola, MD as Consulting Physician (Otolaryngology) Chyrel Masson, DO as Consulting Physician (Otolaryngology) Harold Hedge, Darrick Grinder, MD as Consulting Physician (Allergy and Immunology) Tanda Rockers, MD as Consulting Physician (Pulmonary Disease) Marchia Bond, MD as Consulting Physician (Orthopedic Surgery) Marvene Staff (Inactive) as Consulting Physician (Rheumatology) Bensimhon, Shaune Pascal, MD as Consulting Physician (Cardiology)  This Provider for this visit: Treatment Team:  Attending Provider: Brand Males, MD #  ? OSA  #SARCCOID  - ILD - he did NOT have sarcoidosis in 2011 but was present in 2018 and oct 2020. In both seemed inflammatory but now se/pOct 2021 seems fibrotic (per radiology: oct 2020 -> Sept 2021 there is very mild worsening of scar tissue esp in the right upper side and this could just represent the scar from the lgionella pneumoa in feb t)  - #Lupurs pernio   #arhtropathy  # Legionella pneumonia that he suffered in February 2021.  ->residual pneumatociele Sept/Oct 2021 CT   #Right heart catheterization January 2021 with very mild PAH with normal PVR -Dr. Haroldine Laws.  PVR 1.9 work units, pulmonary capillary wedge pressure 11, pulmonary artery mean pressure 24 -did not meet criteria for inhaled  treprostinil  #Immunosuppressed with chronic prednisone 5 mg/day, methotrexate 50 mg/week and Plaquenil 4 mg/day. -Treatment onset early 2022  04/02/2021 -   Chief Complaint  Patient presents with   Follow-up    Pt states he has been doing okay since last visit and denies any real complaints.     HPI Sean Sharp 45 y.o. -returns for follow-up.  Last seen earlier in 2022.  Then last month he saw a nurse practitioner with bronchitis and given antibiotic and prednisone.  He feels back to baseline.  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  He was also not taking his methotrexate and folic acid and Plaquenil.  Our nurse practitioner refill these.  He is still yet established with [facility] rheumatology.  I have messaged [name].  I also had him call [facility] rheumatology while waiting in our office so that he can make a scheduled appointment.  I emphasized to him the need for compliance particularly with his immunosuppressed status.  He says after going back on his immunomodulators he is actually feeling better.  He had a CT scan of the chest that shows progressive sarcoid findings in the lung compared to 1 year earlier [facility] also has pulmonary hypertension.  He is going to see Dr. Haroldine Laws early January 2023.  His last echo was  over a year ago.  He had a cath in January 2021 and just showed mild elevation pulmonary pressures but did not meet criteria for inhaled treprostinil.  He has not had any recent lab work with him being on immunomodulators.  I expressed some concern about progressive lung findings.  He has not had pulmonary function test.  Unclear why.    CT Chest data - HRCT 02/22/21  Narrative & Impression  CLINICAL DATA:  Sarcoidosis   EXAM: CT CHEST WITHOUT CONTRAST   TECHNIQUE: Multidetector CT imaging of the chest was performed following the standard protocol without intravenous contrast. High  resolution imaging of the lungs, as well as inspiratory and expiratory imaging, was performed.   COMPARISON:  02/28/2020, 07/16/2019, 03/16/2019, 01/05/2017, 04/19/2010   FINDINGS: Cardiovascular: No significant vascular findings. Normal heart size. No pericardial effusion. Enlargement of the main pulmonary artery, measuring up to 3.9 cm in caliber.   Mediastinum/Nodes: Enlarged, coarsely calcified lymph nodes throughout the mediastinum and hila. Thyroid gland, trachea, and esophagus demonstrate no significant findings.   Lungs/Pleura: Redemonstrated very extensive fine nodularity throughout the lungs, many of which are confluent in consolidations and concentrated along the pleural surfaces in fissures and with an upper lobe predominance. Extensive architectural distortion, particularly of the right upper lobe. No significant air trapping on expiratory phase imaging. 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 No significant change in a septated, cavitary, masslike consolidation of the perihilar right lung (series 10, image 141). There is significantly increased subpleural consolidation of the peripheral left lower lobe, largest cross-section measuring 2.9 x 1.6 cm (series 10, image 211). Increased consolidation posteriorly in the left upper lobe abutting the fissure, largest cross-sectional area measuring 3.5 x 2.2 cm (series 10, image 90). Increased consolidation peripherally in the superior segment right lower lobe, greatest cross-sectional area measuring 3.4 x 2.2 cm (series 10, image 171). No pleural effusion or pneumothorax.   Upper Abdomen: No acute abnormality.   Musculoskeletal: No chest wall mass or suspicious bone lesions identified.   IMPRESSION: 1. Redemonstrated very extensive fine nodularity throughout the lungs, many confluent in consolidations and concentrated along the pleural surfaces and fissures and with an upper lobe  predominance. Architectural distortion, particularly of the right upper lobe. No significant change in a septated, cavitary, masslike consolidation of the perihilar right lung. Multiple areas of increased consolidation in the bilateral lungs. 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 2. Enlarged, coarsely calcified lymph nodes throughout the mediastinum and hila, consistent with nodal sarcoidosis. 3. Enlargement of the main pulmonary artery, measuring up to 3.9 cm in caliber, as can be seen in pulmonary hypertension.     Electronically Signed   By: Eddie Candle M.D.   On: 02/24/2021 09:28    No results found.    PFT  PFT Results Latest Ref Rng & Units 09/06/2020 07/24/2020 04/05/2020  FVC-Pre L 3.04 3.04 3.30  FVC-Predicted Pre % 61 61 66  FVC-Post L 3.29 3.29 3.38  FVC-Predicted Post % 66 66 68  Pre FEV1/FVC % % 70 70 75  Post FEV1/FCV % % 75 75 77  FEV1-Pre L 2.13 2.13 2.48  FEV1-Predicted Pre % 53 53 62  FEV1-Post L 2.48 2.48 2.60  DLCO uncorrected ml/min/mmHg 24.55 24.55 25.13  DLCO UNC% % 72 72 73  DLCO corrected ml/min/mmHg 24.55 24.55 25.13  DLCO COR %Predicted % 72 72 73  DLVA Predicted % 140 140 121  TLC L 4.72 4.72 4.87  TLC %  Predicted % 60 60 62  RV % Predicted % 73 73 45       has a past medical history of Beta thalassemia minor, Chronic renal insufficiency, stage II (mild), Chronic rhinitis, Cutaneous sarcoidosis, Hoarseness (2015), HTN (hypertension), Hydrocele, LPRD (laryngopharyngeal reflux disease), Obesity, Class II, BMI 35-39.9, OSA (obstructive sleep apnea) (07/28/2019), Peripheral edema, Prediabetes (02/2017), Pulmonary arterial hypertension ([place]), 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   reports that he quit smoking about 25 years ago. His smoking use included cigarettes. He has a 0.45 pack-year smoking history. He has never used smokeless tobacco.  Past Surgical  History:  Procedure Laterality Date   OLECRANON BURSECTOMY Right 2019   Polysomnogram  07/28/2019   Severe OSA w/hypoxia, mild central sleep apnea, CPAP recommended (Dr. Fransico Him)   RIGHT HEART CATH N/A 06/27/2019   Mild PAH. Procedure: RIGHT HEART CATH;  Surgeon: Jolaine Artist, MD;  Location: [facility];  Service: Cardiovascular;  Laterality: N/A;   SKIN BIOPSY  2018   Sarcoidosis of skin (also has pulm involvement).   SKIN GRAFT  2010   on left index finger   TRANSTHORACIC ECHOCARDIOGRAM  06/22/2019   NORMAL   Vocal cord surgery  2016   [facility]   WISDOM TOOTH EXTRACTION  2005    Allergies  Allergen Reactions   Wasp Venom Swelling    Immunization History  Administered Date(s) Administered   Influenza,inj,Quad PF,6+ Mos 02/21/2020, 04/02/2021   Influenza-Unspecified 04/08/2019   PFIZER(Purple Top)SARS-COV-2 Vaccination 08/20/2019, 09/10/2019   Pneumococcal Polysaccharide-23 08/01/2019   Tdap 04/20/2018    Family History  Problem Relation Age of Onset   Sarcoidosis Father        ?   Lactose intolerance Father    Other Mother        colon issues-portion of colon removed   Lactose intolerance Mother    Cancer Paternal Grandfather        ?     Current Outpatient Medications:    amLODipine (NORVASC) 10 MG tablet, Take 1 tablet (10 mg total) by mouth daily., Disp: 90 tablet, Rfl: 3   Azelastine-Fluticasone 137-50 MCG/ACT SUSP, Place 2 sprays into both nostrils daily., Disp: , Rfl:    clobetasol ointment (TEMOVATE) 0.05 %, Apply topically., Disp: , Rfl:    Clocortolone Pivalate (CLODERM) 0.1 % cream, Apply 1 application topically 2 (two) times daily as needed (Sarcoidosis/rash). , Disp: , Rfl:    hydrocortisone butyrate (LUCOID) 0.1 % CREA cream, Apply 1 application topically 2 (two) times daily., Disp: 60 g, Rfl: 6   hydroxychloroquine (PLAQUENIL) 200 MG tablet, Take 400 mg by mouth daily., Disp: , Rfl:    hydrOXYzine (ATARAX/VISTARIL) 10 MG tablet,  TAKE 1 TO 3 TABLETS BY MOUTH EVERY 4 TO 6 HOURS AS NEEDED FOR ITCHING, Disp: , Rfl:    ipratropium (ATROVENT) 0.03 % nasal spray, Place 2 sprays into both nostrils every 12 (twelve) hours., Disp: 30 mL, Rfl: 12   levocetirizine (XYZAL) 5 MG tablet, SMARTSIG:1 Tablet(s) By Mouth Every Evening, Disp: , Rfl:    losartan (COZAAR) 50 MG tablet, Take 1 tablet (50 mg total) by mouth daily. Must keep further appoints for refill, Disp: 90 tablet, Rfl: 0   methotrexate (RHEUMATREX) 2.5 MG tablet, Take 15 mg by mouth once a week., Disp: , Rfl:    pantoprazole (PROTONIX) 40 MG tablet, TAKE 1 TABLET BY MOUTH 30 TO 60 MINUTES BEFORE YOUR FIRST AND LAST MEALS OF THE DAY, Disp: 180  tablet, Rfl: 3   predniSONE (DELTASONE) 10 MG tablet, 2 tabs daily for 2 weeks then 1 tab daily, Disp: 60 tablet, Rfl: 1   predniSONE (DELTASONE) 5 MG tablet, Take 5 mg by mouth daily with breakfast., Disp: , Rfl:    spironolactone (ALDACTONE) 25 MG tablet, Take 1 tablet (25 mg total) by mouth daily., Disp: 90 tablet, Rfl: 3   folic acid (FOLVITE) 1 MG tablet, Take 1 tablet (1 mg total) by mouth daily., Disp: 30 tablet, Rfl: 5   metFORMIN (GLUCOPHAGE-XR) 500 MG 24 hr tablet, Take 1 tablet (500 mg total) by mouth daily with breakfast. (Patient not taking: Reported on 04/02/2021), Disp: 30 tablet, Rfl: 6   metoprolol succinate (TOPROL-XL) 25 MG 24 hr tablet, Take 1.5 tablets (37.5 mg total) by mouth daily., Disp: 45 tablet, Rfl: 3      Objective:   Vitals:   04/02/21 1555  BP: 126/80  Pulse: 75  Temp: 98.2 F (36.8 C)  TempSrc: Oral  SpO2: 98%  Weight: 291 lb (132 kg)  Height: _0  (1.88 m)    Estimated body mass index is 37.36 kg/m as calculated from the following:   Height as of this encounter: _1  (1.88 m).   Weight as of this encounter: 291 lb (132 kg).  _2 @  Filed Weights   04/02/21 1555  Weight: 291 lb (132 kg)     Physical ExamGeneral: No distress. Looks well Neuro: Alert and Oriented x 3. GCS  15. Speech normal Psych: Pleasant Resp:  Barrel Chest - no.  Wheeze - no, Crackles - no, No overt respiratory distress CVS: Normal heart sounds. Murmurs - no Ext: Stigmata of Connective Tissue Disease - no HEENT: Normal upper airway. PEERL +. No post nasal drip.  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 Sarcoidosis  D86.9 CBC with Differential/Platelet    Basic metabolic panel    Hepatic function panel    Hepatic function panel    Basic metabolic panel    CBC with Differential/Platelet    ECHOCARDIOGRAM COMPLETE    Pulmonary function test    2. Need for immunization against influenza  Z23 Flu Vaccine QUAD 61moIM (Fluarix, Fluzone & Alfiuria Quad PF)    3. Current chronic use of systemic steroids  Z79.52     4. Immunosuppressed status ([place]  D84.9          Plan:     Patient Instructions  Sarcoidosis with skin and lung involvement   - Lung involvement from sarcoid is worse sept 2022 compared to sept 2021 CT  - progession could be related to running out of your medications -It appears the skin involvement with sarcoid [lupus pernio] is worse/persistent -In addition joint involvement is stable/controlled since march 2022 - Too bad that you are off rheumatology followup and went off your medications but glad Tammy in our office has helped with temp refill  - continue daily plaquenil 4052mgaily - continue prednisone 23m67maily - continue methotrexate 15 mg once a week  - restart  continue folic acid 1mg76mily (CMA to send refill)-Do full pulmonary function test in September 2022 - do CBC , BMET , LFT when possible this week with us -[place] meds and lab monitoring from rheumatology  - do full PFT next few to several weeks - do echo few to several weeks - flu shot 04/02/2021  Follow-up -Return to see Dr. Chase Caller in NOv -Dec  2022 - but after PFT and ECHO but before you see Dr Jeffie Pollock in Jan  2023   If pft also shows progerssion consider anti-fibrotic v TNF-alpha blockade  ( Level 05 visit: Estb 40-54 min   in  visit type: on-site physical face to visit  in total care time and counseling or/and coordination of care by this undersigned MD - Dr Brand Males. This includes one or more of the following on this same day 04/02/2021: pre-charting, chart review, note writing, documentation discussion of test results, diagnostic or treatment recommendations, prognosis, risks and benefits of management options, instructions, education, compliance or risk-factor reduction. It excludes time spent by the [place] or office staff in the care of the patient. Actual time 40 min)   SIGNATURE    Dr. Brand Males, M.D., F.C.C.P,  Pulmonary and Critical Care Medicine Staff Physician, [place] Director - Interstitial Lung Disease  Program  [facility] at [place], [place], [address]  Pager: [phone], If no answer or between  15:00h - 7:00h: call [phone] Telephone: [phone]  5:09 PM 04/02/2021

## 2021-04-02 NOTE — Patient Instructions (Addendum)
Sarcoidosis with skin and lung involvement   - Lung involvement from sarcoid is worse sept 2022 compared to sept 2021 CT  - progession could be related to running out of your medications -It appears the skin involvement with sarcoid [lupus pernio] is worse/persistent -In addition joint involvement is stable/controlled since march 2022 - Too bad that you are off rheumatology followup and went off your medications but glad Tammy in our office has helped with temp refill  - continue daily plaquenil 43m gaily - continue prednisone 578mdaily - continue methotrexate 15 mg once a week  - restart  continue folic acid 77m66maily (CMA to send refill)-Do full pulmonary function test in September 2022 - do CBC , BMET , LFT when possible this week with us [place] meds and lab monitoring from rheumatology  - do full PFT next few to several weeks - do echo few to several weeks - flu shot 04/02/2021   Follow-up -Return to see Dr. RamChase Caller NOv -Dec  2022 - but after PFT and ECHO but before you see Dr BEnJeffie Pollock Jan 2023   If pft also shows progerssion consider anti-fibrotic v TNF-alpha blockade

## 2021-04-03 LAB — HEPATIC FUNCTION PANEL
ALT: 19 U/L (ref 0–53)
AST: 21 U/L (ref 0–37)
Albumin: 4.2 g/dL (ref 3.5–5.2)
Alkaline Phosphatase: 51 U/L (ref 39–117)
Bilirubin, Direct: 0.1 mg/dL (ref 0.0–0.3)
Total Bilirubin: 0.4 mg/dL (ref 0.2–1.2)
Total Protein: 7.7 g/dL (ref 6.0–8.3)

## 2021-04-03 LAB — CBC WITH DIFFERENTIAL/PLATELET
Basophils Absolute: 0 10*3/uL (ref 0.0–0.1)
Basophils Relative: 0.4 % (ref 0.0–3.0)
Eosinophils Absolute: 0.2 10*3/uL (ref 0.0–0.7)
Eosinophils Relative: 2.3 % (ref 0.0–5.0)
HCT: 43.1 % (ref 39.0–52.0)
Hemoglobin: 13.6 g/dL (ref 13.0–17.0)
Lymphocytes Relative: 5.9 % — ABNORMAL LOW (ref 12.0–46.0)
Lymphs Abs: 0.5 10*3/uL — ABNORMAL LOW (ref 0.7–4.0)
MCHC: 31.5 g/dL (ref 30.0–36.0)
MCV: 70.9 fl — ABNORMAL LOW (ref 78.0–100.0)
Monocytes Absolute: 0.9 10*3/uL (ref 0.1–1.0)
Monocytes Relative: 11.7 % (ref 3.0–12.0)
Neutro Abs: 6.4 10*3/uL (ref 1.4–7.7)
Neutrophils Relative %: 79.7 % — ABNORMAL HIGH (ref 43.0–77.0)
Platelets: 220 10*3/uL (ref 150.0–400.0)
RBC: 6.07 Mil/uL — ABNORMAL HIGH (ref 4.22–5.81)
RDW: 19.5 % — ABNORMAL HIGH (ref 11.5–15.5)
WBC: 8 10*3/uL (ref 4.0–10.5)

## 2021-04-03 LAB — BASIC METABOLIC PANEL
BUN: 17 mg/dL (ref 6–23)
CO2: 28 mEq/L (ref 19–32)
Calcium: 9.6 mg/dL (ref 8.4–10.5)
Chloride: 101 mEq/L (ref 96–112)
Creatinine, Ser: 1.25 mg/dL (ref 0.40–1.50)
GFR: 69.48 mL/min (ref >60.00)
Glucose, Bld: 94 mg/dL (ref 70–99)
Potassium: 4.2 mEq/L (ref 3.5–5.1)
Sodium: 136 mEq/L (ref 135–145)

## 2021-05-14 ENCOUNTER — Ambulatory Visit: Payer: BC Managed Care – PPO | Admitting: Internal Medicine

## 2021-05-14 ENCOUNTER — Encounter: Payer: BC Managed Care – PPO | Admitting: Family Medicine

## 2021-05-27 ENCOUNTER — Telehealth: Payer: Self-pay | Admitting: Internal Medicine

## 2021-05-27 NOTE — Telephone Encounter (Signed)
Patient has an OV with Rexene Edison NP on 05/28/21.

## 2021-05-27 NOTE — Telephone Encounter (Signed)
I called the patient and he reports that he is having a cough that has yellow sputum x 1-2 weeks and he denies any shortness of breath, fever, body aches or chills. He feels that his sacroid may be flaring and is requesting something for his productive cough. Please advise.

## 2021-05-27 NOTE — Telephone Encounter (Signed)
ATC LVMTCB x1 Pt needs to be scheduled for Acute visit with APP.

## 2021-05-27 NOTE — Telephone Encounter (Signed)
Attempted to call pt but unable to reach. Left message for him to return call. °

## 2021-05-28 ENCOUNTER — Encounter: Payer: Self-pay | Admitting: Adult Health

## 2021-05-28 ENCOUNTER — Ambulatory Visit (INDEPENDENT_AMBULATORY_CARE_PROVIDER_SITE_OTHER): Payer: Self-pay | Admitting: Adult Health

## 2021-05-28 ENCOUNTER — Other Ambulatory Visit: Payer: Self-pay

## 2021-05-28 DIAGNOSIS — G4733 Obstructive sleep apnea (adult) (pediatric): Secondary | ICD-10-CM

## 2021-05-28 DIAGNOSIS — D869 Sarcoidosis, unspecified: Secondary | ICD-10-CM

## 2021-05-28 DIAGNOSIS — J209 Acute bronchitis, unspecified: Secondary | ICD-10-CM

## 2021-05-28 MED ORDER — AZITHROMYCIN 250 MG PO TABS: ORAL_TABLET | ORAL | 0 refills | Status: AC

## 2021-05-28 MED ORDER — PREDNISONE 10 MG PO TABS: ORAL_TABLET | ORAL | 1 refills | Status: DC

## 2021-05-28 NOTE — Progress Notes (Signed)
_0  ID: Sean Sharp, male    DOB: 06-Jul-1975, 45 y.o.   MRN: [id number]  Chief Complaint  Patient presents with   Acute Visit    Referring provider: Tammi Sou, MD  HPI: 45 year old male followed for sarcoidosis with skin and lung involvement Legionella pneumonia in February 2021 with residual pneumatocele right upper lobe  TEST/EVENTS :  High-resolution CT chest February 22, 2021 very extensive fine nodularity throughout the lungs many of which are confluent in the consolidations along the pleural spaces in the fissures with an upper lobe predominance.  Architectural distortion of the right upper lobe.  Thin-walled pneumatocele in the right apex, cavitary masslike consolidation in the right perihilar lung.  Increased subpleural consolidation left lower lobe measuring 2.9 cm.  Increased consolidation in the left upper lobe measuring 3.5 cm.  Increased consolidation in the right lower lobe measuring 3.4 cm.  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  Enlarged coarsely calcified lymph nodes throughout the mediastinum and hilum consistent with nodal sarcoidosis.   PFT 08/2020 FEV1 62%, ratio 75, FVC 66%, postbronchodilator response , DLCO 72%      05/28/2021 Acute OV  Patient presents for an acute office visit.  Patient complains over the last 2 weeks he has had increased cough, congestion thick mucus and shortness of breath.  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  Unfortunately he lost his methotrexate 2 weeks ago.  He is on prednisone currently at 10 mg.  Patient says his breathing has not been doing as well.  Patient does has underlying sleep apnea.  Has not been wearing his CPAP.  Once again we reviewed the dangers of untreated sleep apnea.  Patient also has been using some CBD oil under his tongue which she says occasionally does help his breathing.  Patient denies any fever, chest pain,  orthopnea, edema. Patient continues to follow with rheumatology and dermatology. Patient says that his skin lesion were improving but over the last 2 weeks they have been flaring again.   Allergies  Allergen Reactions   Wasp Venom Swelling    Immunization History  Administered Date(s) Administered   Influenza,inj,Quad PF,6+ Mos 02/21/2020, 04/02/2021   Influenza-Unspecified 04/08/2019   PFIZER(Purple Top)SARS-COV-2 Vaccination 08/20/2019, 09/10/2019   Pneumococcal Polysaccharide-23 08/01/2019   Tdap 04/20/2018    Past Medical History:  Diagnosis Date   Beta thalassemia minor    suspected.  Hemoglobin electrophoresis normal 2016.   Chronic renal insufficiency, stage II (mild)    Chronic rhinitis    Cutaneous sarcoidosis    [place] rheum: as of 02/2019->plaquenil.  Cont plaquenil and slowly tapering prednisone as of 06/2019 rheum f/u   Hoarseness 2015   [facility] 04/2014-normal laryngoscopy: prednisone taper and bid nexium recommended.  No signif help so pt got 2nd opinion at [facility]    HTN (hypertension)    Hydrocele    left   LPRD (laryngopharyngeal reflux disease)    Obesity, Class II, BMI 35-39.9    OSA (obstructive sleep apnea) 07/28/2019   SEVERE OSA, mild central sleep apnea, CPAP trial 08/2019 (Dr. Fransico Him)   Peripheral edema    left leg>right leg   Prediabetes 02/2017   A1c 6.3% (right when he was starting to take prednisone prn for treatment of his sarcoidosis).  HbA1c 08/2017=6.3%. A1c 01/2020 6.4%.   Pulmonary arterial hypertension ([facility])    Mild (R heart cath 06/27/19), echo 06/2019 normal.  Wt loss and CPAP recommended.  Sarcoidosis of lung ([place]) 12/2016   2018, but Dr. Melvyn Novas suspects it was smoldering since 2011.  Skin bx of cutaneous lesion confirmed dx of sarcoidosis 02/2017.  Pt improving fall 2018 with plaquenil and prednisone.  Pt self d/c'd plaquenil.   Vocal cord polyps 2015   surgery [facility]     Tobacco History: Social History   Tobacco Use  Smoking Status  Former   Packs/day: 0.30   Years: 1.50   Pack years: 0.45   Types: Cigarettes   Quit date: 06/10/1995   Years since quitting: 25.9  Smokeless Tobacco Never   Counseling given: Not Answered   Outpatient Medications Prior to Visit  Medication Sig Dispense Refill   amLODipine (NORVASC) 10 MG tablet Take 1 tablet (10 mg total) by mouth daily. 90 tablet 3   Azelastine-Fluticasone 137-50 MCG/ACT SUSP Place 2 sprays into both nostrils daily.     clobetasol ointment (TEMOVATE) 0.05 % Apply topically.     Clocortolone Pivalate (CLODERM) 0.1 % cream Apply 1 application topically 2 (two) times daily as needed (Sarcoidosis/rash).      folic acid (FOLVITE) 1 MG tablet Take 1 tablet (1 mg total) by mouth daily. 30 tablet 5   hydrocortisone butyrate (LUCOID) 0.1 % CREA cream Apply 1 application topically 2 (two) times daily. 60 g 6   hydroxychloroquine (PLAQUENIL) 200 MG tablet Take 400 mg by mouth daily.     hydrOXYzine (ATARAX/VISTARIL) 10 MG tablet TAKE 1 TO 3 TABLETS BY MOUTH EVERY 4 TO 6 HOURS AS NEEDED FOR ITCHING     ipratropium (ATROVENT) 0.03 % nasal spray Place 2 sprays into both nostrils every 12 (twelve) hours. 30 mL 12   levocetirizine (XYZAL) 5 MG tablet SMARTSIG:1 Tablet(s) By Mouth Every Evening     losartan (COZAAR) 50 MG tablet Take 1 tablet (50 mg total) by mouth daily. Must keep further appoints for refill 90 tablet 0   metFORMIN (GLUCOPHAGE-XR) 500 MG 24 hr tablet Take 1 tablet (500 mg total) by mouth daily with breakfast. 30 tablet 6   methotrexate (RHEUMATREX) 2.5 MG tablet Take 15 mg by mouth once a week.     pantoprazole (PROTONIX) 40 MG tablet TAKE 1 TABLET BY MOUTH 30 TO 60 MINUTES BEFORE YOUR FIRST AND LAST MEALS OF THE DAY 180 tablet 3   predniSONE (DELTASONE) 5 MG tablet Take 5 mg by mouth daily with breakfast.     spironolactone (ALDACTONE) 25 MG tablet Take 1 tablet (25 mg total) by mouth daily. 90 tablet 3   metoprolol succinate (TOPROL-XL) 25 MG 24 hr tablet Take 1.5  tablets (37.5 mg total) by mouth daily. 45 tablet 3   predniSONE (DELTASONE) 10 MG tablet 2 tabs daily for 2 weeks then 1 tab daily (Patient not taking: Reported on 05/28/2021) 60 tablet 1   No facility-administered medications prior to visit.     Review of Systems:   Constitutional:   No  weight loss, night sweats,  Fevers, chills,  +fatigue, or  lassitude.  HEENT:   No headaches,  Difficulty swallowing,  Tooth/dental problems, or  Sore throat,                No sneezing, itching, ear ache, nasal congestion, post nasal drip,   CV:  No chest pain,  Orthopnea, PND, swelling in lower extremities, anasarca, dizziness, palpitations, syncope.   GI  No heartburn, indigestion, abdominal pain, nausea, vomiting, diarrhea, change in bowel habits, loss of appetite, bloody stools.   Resp:  No chest  wall deformity  Skin: no rash or lesions.  GU: no dysuria, change in color of urine, no urgency or frequency.  No flank pain, no hematuria   MS:  No joint pain or swelling.  No decreased range of motion.  No back pain.    Physical Exam  BP (!) 160/80 (BP Location: Right Arm, Patient Position: Sitting, Cuff Size: Large)    Pulse 76    Temp 98.3 F (36.8 C) (Oral)    Ht _0  (1.88 m)    Wt 299 lb 3.2 oz (135.7 kg)    SpO2 95%    BMI 38.42 kg/m   GEN: A/Ox3; pleasant , NAD, well nourished    HEENT:  [place]/AT, NOSE-clear, THROAT-clear, no lesions, no postnasal drip or exudate noted.   NECK:  Supple w/ fair ROM; no JVD; normal carotid impulses w/o bruits; no thyromegaly or nodules palpated; no lymphadenopathy.    RESP  Clear  P & A; w/o, wheezes/ rales/ or rhonchi. no accessory muscle use, no dullness to percussion  CARD:  RRR, no m/r/g, no peripheral edema, pulses intact, no cyanosis or clubbing.  GI:   Soft & nt; nml bowel sounds; no organomegaly or masses detected.   Musco: Warm bil, no deformities or joint swelling noted.   Neuro: alert, no focal deficits noted.    Skin: Warm, scattered  circular facial lesions consistent with sarcoid    Lab Results:    BMET   BNP No results found for: BNP  ProBNP No results found for: PROBNP  Imaging: No results found.    PFT Results Latest Ref Rng & Units 09/06/2020 07/24/2020 04/05/2020  FVC-Pre L 3.04 3.04 3.30  FVC-Predicted Pre % 61 61 66  FVC-Post L 3.29 3.29 3.38  FVC-Predicted Post % 66 66 68  Pre FEV1/FVC % % 70 70 75  Post FEV1/FCV % % 75 75 77  FEV1-Pre L 2.13 2.13 2.48  FEV1-Predicted Pre % 53 53 62  FEV1-Post L 2.48 2.48 2.60  DLCO uncorrected ml/min/mmHg 24.55 24.55 25.13  DLCO UNC% % 72 72 73  DLCO corrected ml/min/mmHg 24.55 24.55 25.13  DLCO COR %Predicted % 72 72 73  DLVA Predicted % 140 140 121  TLC L 4.72 4.72 4.87  TLC % Predicted % 60 60 62  RV % Predicted % 73 73 45    Lab Results  Component Value Date   NITRICOXIDE 18 12/11/2016        Assessment & Plan:   Sarcoidosis with skin and lung involvement  Flare off the methotrexate.  Patient is encouraged on medication compliance Patient was recommended for PFT and echo.  Patient is encouraged to get these completed  Plan  Patient Instructions  Increase  Prednisone 41m daily for 1 weeks, 124mdaily -hold on this dose.  Begin Zpack take as directed.  Mucinex DM Twice daily  As needed  cough/congestion .  Continue on Methotrexate and Plaquenil.  Restart CPAP At bedtime  -wear all night .  Nasal Saline gel At bedtime   Yearly eye exams  PFT and Echo as recommended  Follow up with Dr. RaChase Caller-6 weeks and As needed   Please contact office for sooner follow up if symptoms do not improve or worsen or seek emergency care          OSA (obstructive sleep apnea) Encouraged to restart CPAP  Acute bronchitis Flare   Plan  Patient Instructions  Increase  Prednisone 2036maily for 1 weeks, 80m59mily -hold  on this dose.  Begin Zpack take as directed.  Mucinex DM Twice daily  As needed  cough/congestion .  Continue on  Methotrexate and Plaquenil.  Restart CPAP At bedtime  -wear all night .  Nasal Saline gel At bedtime   Yearly eye exams  PFT and Echo as recommended  Follow up with Dr. Chase Caller 4-6 weeks and As needed   Please contact office for sooner follow up if symptoms do not improve or worsen or seek emergency care            Rexene Edison, NP 05/28/2021

## 2021-05-28 NOTE — Assessment & Plan Note (Signed)
Flare   Plan  Patient Instructions  Increase  Prednisone 11m daily for 1 weeks, 173mdaily -hold on this dose.  Begin Zpack take as directed.  Mucinex DM Twice daily  As needed  cough/congestion .  Continue on Methotrexate and Plaquenil.  Restart CPAP At bedtime  -wear all night .  Nasal Saline gel At bedtime   Yearly eye exams  PFT and Echo as recommended  Follow up with Dr. RaChase Caller-6 weeks and As needed   Please contact office for sooner follow up if symptoms do not improve or worsen or seek emergency care

## 2021-05-28 NOTE — Assessment & Plan Note (Addendum)
Flare off the methotrexate.  Patient is encouraged on medication compliance Patient was recommended for PFT and echo.  Patient is encouraged to get these completed  Plan  Patient Instructions  Increase  Prednisone 52m daily for 1 weeks, 118mdaily -hold on this dose.  Begin Zpack take as directed.  Mucinex DM Twice daily  As needed  cough/congestion .  Continue on Methotrexate and Plaquenil.  Restart CPAP At bedtime  -wear all night .  Nasal Saline gel At bedtime   Yearly eye exams  PFT and Echo as recommended  Follow up with Dr. RaChase Caller-6 weeks and As needed   Please contact office for sooner follow up if symptoms do not improve or worsen or seek emergency care

## 2021-05-28 NOTE — Assessment & Plan Note (Signed)
Encouraged to restart CPAP

## 2021-05-28 NOTE — Patient Instructions (Addendum)
Increase  Prednisone 33m daily for 1 weeks, 122mdaily -hold on this dose.  Begin Zpack take as directed.  Mucinex DM Twice daily  As needed  cough/congestion .  Continue on Methotrexate and Plaquenil.  Restart CPAP At bedtime  -wear all night .  Nasal Saline gel At bedtime   Yearly eye exams  PFT and Echo as recommended  Follow up with Dr. RaChase Caller-6 weeks and As needed   Please contact office for sooner follow up if symptoms do not improve or worsen or seek emergency care

## 2021-06-06 ENCOUNTER — Other Ambulatory Visit: Payer: Self-pay

## 2021-06-11 ENCOUNTER — Encounter: Payer: Self-pay | Admitting: Family Medicine

## 2021-06-11 NOTE — Progress Notes (Deleted)
Office Note 06/11/2021  CC: No chief complaint on file.   HPI:  Patient is a 46 y.o. male who is here for annual health maintenance exam and  A/P as of last visit: "1) HTN, well controlled. Cont metoprolol, adactone, losartan, and amlodipine. Lytes/cr today.   2) Prediabetes: better diet, has lost some wt. He has not been taking metformin after all---per his choice. Hba1c today.   3) Chronic rhinitis, with episodic marked increase in clear/watery nasal secretions causing PND. Will try adding ipratropium nasal spray bid prn when he gets these episodes. 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 Takes prednisone prn. 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  Hemoglobin electrophoresis normal 2016.   Chronic renal insufficiency, stage II (mild)    Chronic rhinitis    Cutaneous sarcoidosis    [place] rheum: as of 02/2019->plaquenil.  Cont plaquenil and slowly tapering prednisone as of 06/2019 rheum f/u   Hoarseness 2015   [facility] 04/2014-normal laryngoscopy: prednisone taper and bid nexium recommended.  No signif help so pt got 2nd opinion at [facility]    HTN (hypertension)    Hydrocele    left   LPRD (laryngopharyngeal reflux disease)    Obesity, Class II, BMI 35-39.9    OSA (obstructive sleep apnea) 07/28/2019   SEVERE OSA, mild central sleep apnea, CPAP trial 08/2019 (Dr. Fransico Him)   Peripheral edema    left leg>right leg   Prediabetes 02/2017   A1c 6.3% (right when he was starting to take prednisone prn for treatment of his sarcoidosis).  HbA1c 08/2017=6.3%. A1c 01/2020 6.4%.   Pulmonary arterial hypertension (HCC)    Mild (R heart cath 06/27/19), echo 06/2019 normal.  Wt loss and CPAP recommended.   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 Melvyn Novas suspects it was smoldering since 2011.  Skin bx of  cutaneous lesion confirmed dx of sarcoidosis 02/2017.  Pt improving fall 2018 with plaquenil and prednisone.  Pt self d/c'd plaquenil.   Vocal cord polyps 2015   surgery [facility]     Past Surgical History:  Procedure Laterality Date   OLECRANON BURSECTOMY Right 2019   Polysomnogram  07/28/2019   Severe OSA w/hypoxia, mild central sleep apnea, CPAP recommended (Dr. Fransico Him)   RIGHT HEART CATH N/A 06/27/2019   Mild PAH. Procedure: RIGHT HEART CATH;  Surgeon: Jolaine Artist, MD;  Location: [facility];  Service: Cardiovascular;  Laterality: N/A;   SKIN BIOPSY  2018   Sarcoidosis of skin (also has pulm involvement).   SKIN GRAFT  2010   on left index finger   TRANSTHORACIC ECHOCARDIOGRAM  06/22/2019   NORMAL   Vocal cord surgery  2016   [facility]   WISDOM TOOTH EXTRACTION  2005    Family History  Problem Relation Age of Onset   Sarcoidosis Father        ?   Lactose intolerance Father    Other Mother        colon issues-portion of colon removed   Lactose intolerance Mother    Cancer Paternal Grandfather        ?    Social History   Socioeconomic History   Marital status: Married    Spouse name: Not on file   Number of children: 2   Years  of education: Not on file   Highest education level: Not on file  Occupational History   Occupation: electrician  Tobacco Use   Smoking status: Former    Packs/day: 0.30    Years: 1.50    Pack years: 0.45    Types: Cigarettes    Quit date: 06/10/1995    Years since quitting: 26.0   Smokeless tobacco: Never  Substance and Sexual Activity   Alcohol use: Yes    Comment: occasional   Drug use: No   Sexual activity: Not on file  Other Topics Concern   Not on file  Social History Narrative   Married, 2 children (10 and 66 y/o).   Occupation: Clinical biochemist Editor, commissioning).   Orig from [facility].   No Tobacco.  Rare alcohol.  No drugs.   Enjoys fishing, Environmental health practitioner, wood working, Research officer, trade union.      Social  Determinants of Health   Financial Resource Strain: Not on file  Food Insecurity: Not on file  Transportation Needs: Not on file  Physical Activity: Not on file  Stress: Not on file  Social Connections: Not on file  Intimate Partner Violence: Not on file    Outpatient Medications Prior to Visit  Medication Sig Dispense Refill   amLODipine (NORVASC) 10 MG tablet Take 1 tablet (10 mg total) by mouth daily. 90 tablet 3   Azelastine-Fluticasone 137-50 MCG/ACT SUSP Place 2 sprays into both nostrils daily.     clobetasol ointment (TEMOVATE) 0.05 % Apply topically.     Clocortolone Pivalate (CLODERM) 0.1 % cream Apply 1 application topically 2 (two) times daily as needed (Sarcoidosis/rash).      folic acid (FOLVITE) 1 MG tablet Take 1 tablet (1 mg total) by mouth daily. 30 tablet 5   hydrocortisone butyrate (LUCOID) 0.1 % CREA cream Apply 1 application topically 2 (two) times daily. 60 g 6   hydroxychloroquine (PLAQUENIL) 200 MG tablet Take 400 mg by mouth daily.     hydrOXYzine (ATARAX/VISTARIL) 10 MG tablet TAKE 1 TO 3 TABLETS BY MOUTH EVERY 4 TO 6 HOURS AS NEEDED FOR ITCHING     ipratropium (ATROVENT) 0.03 % nasal spray Place 2 sprays into both nostrils every 12 (twelve) hours. 30 mL 12   levocetirizine (XYZAL) 5 MG tablet SMARTSIG:1 Tablet(s) By Mouth Every Evening     losartan (COZAAR) 50 MG tablet Take 1 tablet (50 mg total) by mouth daily. Must keep further appoints for refill 90 tablet 0   metFORMIN (GLUCOPHAGE-XR) 500 MG 24 hr tablet Take 1 tablet (500 mg total) by mouth daily with breakfast. 30 tablet 6   methotrexate (RHEUMATREX) 2.5 MG tablet Take 15 mg by mouth once a week.     metoprolol succinate (TOPROL-XL) 25 MG 24 hr tablet Take 1.5 tablets (37.5 mg total) by mouth daily. 45 tablet 3   pantoprazole (PROTONIX) 40 MG tablet TAKE 1 TABLET BY MOUTH 30 TO 60 MINUTES BEFORE YOUR FIRST AND LAST MEALS OF THE DAY 180 tablet 3   predniSONE (DELTASONE) 10 MG tablet 2 tabs daily for 1 week  , then 1 tab daily. 45 tablet 1   predniSONE (DELTASONE) 5 MG tablet Take 5 mg by mouth daily with breakfast.     spironolactone (ALDACTONE) 25 MG tablet Take 1 tablet (25 mg total) by mouth daily. 90 tablet 3   triamcinolone cream (KENALOG) 0.1 % SMARTSIG:1 Application Topical 2-3 Times Daily     No facility-administered medications prior to visit.    Allergies  Allergen  Reactions   Wasp Venom Swelling    ROS *** PE; Vitals with BMI 05/28/2021 04/02/2021 03/05/2021  Height _0  _1  _2   Weight 299 lbs 3 oz 291 lbs 285 lbs 13 oz  BMI 38.4 20.81 38.87  Systolic 195 974 718  Diastolic 80 80 84  Pulse 76 75 88     *** Pertinent labs:  Lab Results  Component Value Date   TSH 1.49 02/03/2020   Lab Results  Component Value Date   WBC 8.0 04/02/2021   HGB 13.6 04/02/2021   HCT 43.1 04/02/2021   MCV 70.9 (L) 04/02/2021   PLT 220.0 04/02/2021   Lab Results  Component Value Date   IRON 58 11/03/2014   TIBC 270 11/03/2014   FERRITIN 466 (H) 07/16/2019   Lab Results  Component Value Date   CREATININE 1.25 04/02/2021   BUN 17 04/02/2021   NA 136 04/02/2021   K 4.2 04/02/2021   CL 101 04/02/2021   CO2 28 04/02/2021   Lab Results  Component Value Date   ALT 19 04/02/2021   AST 21 04/02/2021   ALKPHOS 51 04/02/2021   BILITOT 0.4 04/02/2021   Lab Results  Component Value Date   CHOL 157 08/21/2020   Lab Results  Component Value Date   HDL 31.50 (L) 08/21/2020   Lab Results  Component Value Date   LDLCALC 105 (H) 08/21/2020   Lab Results  Component Value Date   TRIG 100.0 08/21/2020   Lab Results  Component Value Date   CHOLHDL 5 08/21/2020   Lab Results  Component Value Date   PSA 0.79 08/25/2014   Lab Results  Component Value Date   HGBA1C 6.1 01/11/2021   ASSESSMENT AND PLAN:     Health maintenance exam: Reviewed age and gender appropriate health maintenance issues (prudent diet, regular exercise, health risks of tobacco and  excessive alcohol, use of seatbelts, fire alarms in home, use of sunscreen).  Also reviewed age and gender appropriate health screening as well as vaccine recommendations. Vaccines: Prevnar 20->***.  Otherwise UTD. Labs: TSH, lipids, CMET, Hba1c. Prostate ca screening: average risk patient= as per latest guidelines, start screening at 86 yrs of age. Colon ca screening: pt due for initial screening at any time now--->***.   An After Visit Summary was printed and given to the patient.  FOLLOW UP:  No follow-ups on file.  Signed:  Crissie Sickles, MD           06/11/2021

## 2021-06-13 ENCOUNTER — Ambulatory Visit (HOSPITAL_COMMUNITY)
Admission: RE | Admit: 2021-06-13 | Discharge: 2021-06-13 | Disposition: A | Discharge status: Home / Self Care | Payer: No Typology Code available for payment source | Source: Ambulatory Visit | Attending: Internal Medicine | Admitting: Internal Medicine

## 2021-06-13 ENCOUNTER — Ambulatory Visit (HOSPITAL_BASED_OUTPATIENT_CLINIC_OR_DEPARTMENT_OTHER): Payer: No Typology Code available for payment source

## 2021-06-13 ENCOUNTER — Other Ambulatory Visit: Payer: Self-pay

## 2021-06-13 ENCOUNTER — Encounter (HOSPITAL_COMMUNITY): Payer: Self-pay | Admitting: Internal Medicine

## 2021-06-13 VITALS — BP 160/92 | HR 91 | Wt 301.4 lb

## 2021-06-13 DIAGNOSIS — D860 Sarcoidosis of lung: Secondary | ICD-10-CM | POA: Diagnosis present

## 2021-06-13 DIAGNOSIS — I10 Essential (primary) hypertension: Secondary | ICD-10-CM | POA: Diagnosis not present

## 2021-06-13 DIAGNOSIS — Z87891 Personal history of nicotine dependence: Secondary | ICD-10-CM | POA: Insufficient documentation

## 2021-06-13 DIAGNOSIS — E118 Type 2 diabetes mellitus with unspecified complications: Secondary | ICD-10-CM | POA: Diagnosis not present

## 2021-06-13 DIAGNOSIS — Z79899 Other long term (current) drug therapy: Secondary | ICD-10-CM | POA: Insufficient documentation

## 2021-06-13 DIAGNOSIS — D869 Sarcoidosis, unspecified: Secondary | ICD-10-CM

## 2021-06-13 DIAGNOSIS — I2721 Secondary pulmonary arterial hypertension: Secondary | ICD-10-CM | POA: Insufficient documentation

## 2021-06-13 DIAGNOSIS — G4733 Obstructive sleep apnea (adult) (pediatric): Secondary | ICD-10-CM | POA: Diagnosis not present

## 2021-06-13 DIAGNOSIS — R0602 Shortness of breath: Secondary | ICD-10-CM | POA: Insufficient documentation

## 2021-06-13 DIAGNOSIS — I272 Pulmonary hypertension, unspecified: Secondary | ICD-10-CM

## 2021-06-13 DIAGNOSIS — I5030 Unspecified diastolic (congestive) heart failure: Secondary | ICD-10-CM

## 2021-06-13 DIAGNOSIS — R609 Edema, unspecified: Secondary | ICD-10-CM | POA: Diagnosis not present

## 2021-06-13 LAB — ECHOCARDIOGRAM COMPLETE
Area-P 1/2: 3.71 cm2
S' Lateral: 3 cm

## 2021-06-13 NOTE — Progress Notes (Signed)
ADVANCED HF CLINIC NOTE  Referring Physician: Marella Chimes PA [facility]) Primary Care: [facility] Primary Cardiologist: New  HPI:  46 y/o morbidly obese male with HTN, sarcoidosis (skin and lung involvement), borderline DM2, OSA (noncompliant with CPAP) referred by Marella Chimes, PA for further evaluation of [place] in the setting of sarcoidosis.   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 Melvyn Novas. . Now on prednisone with plaquenil daily with Rheumatology. Had chest CT 9/21 which showed dilated PA.  Has OSA and has CPAP but non compliant with it because he doesn't feel like it works. Does not have a sleep apnea doc.  No h/o DVT/PE.  I first saw him in 1/21 for dilated pulmonary artery and suspicion of PAH. RHC showed just mildly elevated PA pressures with normal PCWP and PVR. PFTs ordered but deferred due to [place]  Sleep study 3/21 severe OSA AHI was 84.6  Has been following with Dr. Radford Pax and ENT in [place]. 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   Here for f/u. Says he feels ok. Used CPAP for 2 weeks and felt it made him feel better for a while then he feels like it stopped working. Now no longer using.   Echo today 06/13/21 EF 65-70% RV normal. Insufficient TR to assess RVSP. Personally reviewed   PFTs 3/22 FEV1 2.13L (53%) FVC 3.0 (61%) Ratio 70% DLCO 72%    Hires CT 9/21 1. Resolved bilateral multi lobar pneumonia. Residual postinfectious pneumatocele in the right upper lobe. 2. Extensive pulmonary parenchymal findings of sarcoidosis are stable in the interval since 07/16/2019 as detailed. 3. Stable calcified mediastinal and bilateral hilar adenopathy, compatible with sarcoidosis. 4. One vessel coronary atherosclerosis. 5. Symmetric mild bilateral gynecomastia, worsened.   RHC 1/21 RA = 9 RV = 36/12 PA = 38/6 (24) PCW = 11 Fick cardiac output/index = 7.1/2.7 PVR = 1.9 WU FA sat = 98% PA sat = 72%, 75% SVC sat = 75%  Echo 1/21 EF  60-65% RV ok not enough TR to estimate RVSP   CT 10/20 1. Mediastinal, hilar and pulmonary parenchymal changes of sarcoid, the latter of which appear mildly progressive from 01/05/2017. 2. Enlarged pulmonic trunk, indicative of pulmonary arterial hypertension.   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  Hemoglobin electrophoresis normal 2016.   Chronic renal insufficiency, stage II (mild)    Chronic rhinitis    Cutaneous sarcoidosis    [place] rheum: as of 02/2019->plaquenil.  Cont plaquenil and slowly tapering prednisone as of 06/2019 rheum f/u   Hoarseness 2015   [facility] [facility] 04/2014-normal laryngoscopy: prednisone taper and bid nexium recommended.  No signif help so pt got 2nd opinion at [facility]    HTN (hypertension)    Hydrocele    left   LPRD (laryngopharyngeal reflux disease)    Obesity, Class II, BMI 35-39.9    OSA (obstructive sleep apnea) 07/28/2019   SEVERE OSA, mild central sleep apnea, CPAP trial 08/2019 (Dr. Fransico Him)   Peripheral edema    left leg>right leg   Prediabetes 02/2017   A1c 6.3% (right when he was starting to take prednisone prn for treatment of his sarcoidosis).  HbA1c 08/2017=6.3%. A1c 01/2020 6.4%.   Pulmonary arterial hypertension (HCC)    Mild (R heart cath 06/27/19), echo 06/2019 normal.  Wt loss and CPAP recommended.   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 Melvyn Novas suspects it was  smoldering since 2011.  Skin bx of cutaneous lesion confirmed dx of sarcoidosis 02/2017.  Pt improving fall 2018 with plaquenil and prednisone.  Pt self d/c'd plaquenil.   Vocal cord polyps 2015   surgery [facility]     Current Outpatient Medications  Medication Sig Dispense Refill   amLODipine (NORVASC) 10 MG tablet Take 1 tablet (10 mg total) by mouth daily. 90 tablet 3   Azelastine-Fluticasone 137-50 MCG/ACT SUSP Place 2 sprays into both nostrils daily.     clobetasol ointment (TEMOVATE) 0.05 % Apply topically.     Clocortolone Pivalate  (CLODERM) 0.1 % cream Apply 1 application topically 2 (two) times daily as needed (Sarcoidosis/rash).      folic acid (FOLVITE) 1 MG tablet Take 1 tablet (1 mg total) by mouth daily. 30 tablet 5   hydrocortisone butyrate (LUCOID) 0.1 % CREA cream Apply 1 application topically 2 (two) times daily. (Patient taking differently: Apply 1 application topically 2 (two) times daily. As needed) 60 g 6   hydroxychloroquine (PLAQUENIL) 200 MG tablet Take 400 mg by mouth daily.     hydrOXYzine (ATARAX/VISTARIL) 10 MG tablet TAKE 1 TO 3 TABLETS BY MOUTH EVERY 4 TO 6 HOURS AS NEEDED FOR ITCHING     ipratropium (ATROVENT) 0.03 % nasal spray Place 2 sprays into both nostrils every 12 (twelve) hours. 30 mL 12   levocetirizine (XYZAL) 5 MG tablet SMARTSIG:1 Tablet(s) By Mouth Every Evening     losartan (COZAAR) 50 MG tablet Take 1 tablet (50 mg total) by mouth daily. Must keep further appoints for refill 90 tablet 0   methotrexate (RHEUMATREX) 2.5 MG tablet Take 15 mg by mouth once a week.     metoprolol succinate (TOPROL-XL) 25 MG 24 hr tablet Take 1.5 tablets (37.5 mg total) by mouth daily. 45 tablet 3   pantoprazole (PROTONIX) 40 MG tablet TAKE 1 TABLET BY MOUTH 30 TO 60 MINUTES BEFORE YOUR FIRST AND LAST MEALS OF THE DAY 180 tablet 3   predniSONE (DELTASONE) 10 MG tablet Take 10 mg by mouth daily with breakfast.     spironolactone (ALDACTONE) 25 MG tablet Take 1 tablet (25 mg total) by mouth daily. 90 tablet 3   triamcinolone cream (KENALOG) 0.1 % SMARTSIG:1 Application Topical 2-3 Times Daily     No current facility-administered medications for this encounter.    Allergies  Allergen Reactions   Wasp Venom Swelling      Social History   Socioeconomic History   Marital status: Married    Spouse name: Not on file   Number of children: 2   Years of education: Not on file   Highest education level: Not on file  Occupational History   Occupation: electrician  Tobacco Use   Smoking status: Former     Packs/day: 0.30    Years: 1.50    Pack years: 0.45    Types: Cigarettes    Quit date: 06/10/1995    Years since quitting: 26.0   Smokeless tobacco: Never  Substance and Sexual Activity   Alcohol use: Yes    Comment: occasional   Drug use: No   Sexual activity: Not on file  Other Topics Concern   Not on file  Social History Narrative   Married, 2 children (9 and 46 y/o).   Occupation: Clinical biochemist Editor, commissioning).   Orig from [employer] [employer] [facility].   No Tobacco.  Rare alcohol.  No drugs.   Enjoys fishing, Environmental health practitioner, wood working, Research officer, trade union.      Social Determinants  of Health   Financial Resource Strain: Not on file  Food Insecurity: Not on file  Transportation Needs: Not on file  Physical Activity: Not on file  Stress: Not on file  Social Connections: Not on file  Intimate Partner Violence: Not on file      Family History  Problem Relation Age of Onset   Sarcoidosis Father        ?   Lactose intolerance Father    Other Mother        colon issues-portion of colon removed   Lactose intolerance Mother    Cancer Paternal Grandfather        ?    Vitals:   06/13/21 1436  BP: (!) 160/92  Pulse: 91  SpO2: 97%  Weight: (!) 136.7 kg (301 lb 6.4 oz)   Wt Readings from Last 3 Encounters:  06/13/21 (!) 136.7 kg (301 lb 6.4 oz)  05/28/21 135.7 kg (299 lb 3.2 oz)  04/02/21 132 kg (291 lb)    PHYSICAL EXAM: General:  Well appearing. No resp difficulty HEENT: normal Neck: supple. no JVD. Carotids 2+ bilat; no bruits. No lymphadenopathy or thryomegaly appreciated. Cor: PMI nondisplaced. Regular rate & rhythm. No rubs, gallops or murmurs. Lungs: clear Abdomen: obese soft, nontender, nondistended. No hepatosplenomegaly. No bruits or masses. Good bowel sounds. Extremities: no cyanosis, clubbing, rash, edema + sarcoid skin lesions Neuro: alert & orientedx3, cranial nerves grossly intact. moves all 4 extremities w/o difficulty. 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 Enlarged pulmonary artery in setting of severe pulmonary sarcoid and OSA - ECho 1/21 EF 60%. RV ok. No evidence of PAH or RV strain - RHC with very mild PAH with normal PVR - PSG with very severe OSA (AHI 84). Suspect this is major contributor to [facility] - Hi-res CT 9/21 severe pulmonary sarcoid - PFTs as above - Echo today 06/13/21 EF 65-70% RV normal. Insufficient TR to assess RVSP. Personally reviewed  2. OSA - PSG 1/21 with very severe OSA (AHI 84).  - Long talk about mechanism of OSA and need for treatment. He cannot tolerate mask - Will refer to Dr. Wilburn Cornelia in ENT for hypoglossal nerve surgery. Also refer basck to Dr. Warrick Parisian in the Sleep Clinic  3. HTN - BP up today but says it has been 120-130 for the most part recently and review of flowsheet from other clinic visits confirms this  - Continue losartan 25 daily for now  - Needs OSA treated  4. Morbid obesity - Will refer to risk reduction clinic to consider addition of GLP1RA  5. Severe pulmonary sarcoid - following with Dr. Chase Caller  - continue current therapy  Glori Bickers, MD  3:04 PM

## 2021-06-13 NOTE — Patient Instructions (Signed)
Medication Changes:  No changes  Lab Work:  none  Testing/Procedures:  none  Referrals:  You have been referred to Dr. Fransico Him for sleep study. The office will call you with an appointment.  You have been referred to Dr. Wilburn Cornelia for the nerve Stimulator. The office will call you with an appointment    Special Instructions // Education:  none  Follow-Up in: 9 months (October 2023) ** Call office in September for appointment **  At the [facility], you and your health needs are our priority. We have a designated team specialized in the treatment of Heart Failure. This Care Team includes your primary Heart Failure Specialized Cardiologist (physician), Advanced Practice Providers (APPs- Physician Assistants and Nurse Practitioners), and Pharmacist who all work together to provide you with the care you need, when you need it.   You may see any of the following providers on your designated Care Team at your next follow up:  Dr Glori Bickers Dr Haynes Kerns, NP Lyda Jester, [place] [facility] [place], [place] Audry Riles, PharmD   Please be sure to bring in all your medications bottles to every appointment.   Need to Contact us:  If you have any questions or concerns before your next appointment please send [place] a message through [place] or call our office at [phone].    TO LEAVE A MESSAGE FOR THE NURSE SELECT OPTION 2, PLEASE LEAVE A MESSAGE INCLUDING: YOUR NAME DATE OF BIRTH CALL BACK NUMBER REASON FOR CALL**this is important as we prioritize the call backs  YOU WILL RECEIVE A CALL BACK THE SAME DAY AS LONG AS YOU CALL BEFORE 4:00 PM

## 2021-06-14 NOTE — Addendum Note (Signed)
Encounter addended by: Jerl Mina, RN on: 06/14/2021 12:35 PM  Actions taken: Clinical Note Signed

## 2021-06-14 NOTE — Addendum Note (Signed)
Encounter addended by: Jerl Mina, RN on: 06/14/2021 9:44 AM  Actions taken: Order list changed

## 2021-06-14 NOTE — Progress Notes (Signed)
Referral faxed to Dr. Wilburn Cornelia on 06/14/2021 @ 12.30pm

## 2021-06-18 ENCOUNTER — Telehealth: Payer: Self-pay | Admitting: Student-PharmD

## 2021-06-18 MED ORDER — SEMAGLUTIDE-WEIGHT MANAGEMENT 2.4 MG/0.75ML ~~LOC~~ SOAJ
2.4000 mg | SUBCUTANEOUS | 11 refills | Status: DC
Start: 2021-10-12 — End: 2021-06-18

## 2021-06-18 MED ORDER — SEMAGLUTIDE-WEIGHT MANAGEMENT 1.7 MG/0.75ML ~~LOC~~ SOAJ
1.7000 mg | SUBCUTANEOUS | 0 refills | Status: DC
Start: 2021-09-13 — End: 2021-06-18

## 2021-06-18 MED ORDER — SEMAGLUTIDE-WEIGHT MANAGEMENT 0.25 MG/0.5ML ~~LOC~~ SOAJ
0.2500 mg | SUBCUTANEOUS | 0 refills | Status: DC
Start: 2021-06-18 — End: 2021-06-18

## 2021-06-18 MED ORDER — SEMAGLUTIDE-WEIGHT MANAGEMENT 0.5 MG/0.5ML ~~LOC~~ SOAJ
0.5000 mg | SUBCUTANEOUS | 0 refills | Status: DC
Start: 2021-07-17 — End: 2021-06-18

## 2021-06-18 MED ORDER — SEMAGLUTIDE-WEIGHT MANAGEMENT 1 MG/0.5ML ~~LOC~~ SOAJ
1.0000 mg | SUBCUTANEOUS | 0 refills | Status: DC
Start: 2021-08-15 — End: 2021-06-18

## 2021-06-18 NOTE — Telephone Encounter (Signed)
Received referral from Dr. Haroldine Laws to start semaglutide for this patient for weight loss. They meet FDA approved criteria for semaglutide for use in obesity given BMI 38.70. Obesity is complicated by chronic conditions including HTN, OSA, prediabetes. No contraindications seen in the chart to [facility] use.   Submitted PA for [facility] but no PA is required. Sent Rx to his pharmacy to confirm the cost which is $1300. Called the patient to let him know - he will not be starting therapy but he appreciated the call.

## 2021-06-20 ENCOUNTER — Other Ambulatory Visit (HOSPITAL_COMMUNITY): Payer: Self-pay | Admitting: Internal Medicine

## 2021-07-05 ENCOUNTER — Other Ambulatory Visit: Payer: Self-pay

## 2021-07-05 ENCOUNTER — Ambulatory Visit (INDEPENDENT_AMBULATORY_CARE_PROVIDER_SITE_OTHER): Payer: Self-pay | Admitting: Internal Medicine

## 2021-07-05 ENCOUNTER — Encounter: Payer: Self-pay | Admitting: Internal Medicine

## 2021-07-05 VITALS — BP 130/80 | HR 92 | Temp 98.2°F | Ht 74.0 in | Wt 295.0 lb

## 2021-07-05 DIAGNOSIS — D869 Sarcoidosis, unspecified: Secondary | ICD-10-CM

## 2021-07-05 DIAGNOSIS — D849 Immunodeficiency, unspecified: Secondary | ICD-10-CM

## 2021-07-05 DIAGNOSIS — Z7952 Long term (current) use of systemic steroids: Secondary | ICD-10-CM

## 2021-07-05 LAB — PULMONARY FUNCTION TEST
DL/VA % pred: 130 %
DL/VA: 5.81 ml/min/mmHg/L
DLCO cor % pred: 68 %
DLCO cor: 23.39 ml/min/mmHg
DLCO unc % pred: 68 %
DLCO unc: 23.39 ml/min/mmHg
FEF 25-75 Post: 1.76 L/sec
FEF 25-75 Pre: 1.53 L/sec
FEF2575-%Change-Post: 15 %
FEF2575-%Pred-Post: 44 %
FEF2575-%Pred-Pre: 38 %
FEV1-%Change-Post: 4 %
FEV1-%Pred-Post: 55 %
FEV1-%Pred-Pre: 53 %
FEV1-Post: 2.19 L
FEV1-Pre: 2.11 L
FEV1FVC-%Change-Post: 0 %
FEV1FVC-%Pred-Pre: 87 %
FEV6-%Change-Post: 3 %
FEV6-%Pred-Post: 63 %
FEV6-%Pred-Pre: 61 %
FEV6-Post: 3.06 L
FEV6-Pre: 2.96 L
FEV6FVC-%Pred-Post: 102 %
FEV6FVC-%Pred-Pre: 102 %
FVC-%Change-Post: 3 %
FVC-%Pred-Post: 62 %
FVC-%Pred-Pre: 60 %
FVC-Post: 3.06 L
FVC-Pre: 2.96 L
Post FEV1/FVC ratio: 72 %
Post FEV6/FVC ratio: 100 %
Pre FEV1/FVC ratio: 71 %
Pre FEV6/FVC Ratio: 100 %
RV % pred: 76 %
RV: 1.64 L
TLC % pred: 59 %
TLC: 4.62 L

## 2021-07-05 NOTE — Progress Notes (Signed)
Full PFT performed today. °

## 2021-07-05 NOTE — Progress Notes (Signed)
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 smoking   1997 s sequelae 2006  With exp to foam bad cough resolved w/in 3 days of avoidance and did fine until 2011 really bad cold > chronic cough eval by Dr Joya Gaskins 04/2010 with suspicion for sarcoid (pos in father) but CT with only nonspecific adenopathy  waxes and wanes since then with extensive w/u / rx at [facility] voice center (reviewed in care everywhere)  And much  worse x 09/2016 assoc with hb/ worse with certain foods and some better dymista self referred to pulmonary clinic 12/11/2016    History of Present Illness  12/11/2016 [facility] office visit/ Wert   Chief Complaint  Patient presents with   Pulmonary Consult    Self referral. Pt c/o increased cough and SOB over the past 3 months. He states his cough is not really productive. It never bothers him at night. He sometimes coughs until the point he feels lightheaded, and has "passed out" once before due to cough.    cough x 6 years  Citric drinks make it worse  Allergy eval [facility] cedar allergy  nexium as walking the door     Kouffman Reflux v Neurogenic Cough Differentiator Reflux Comments  Do you awaken from a sound sleep coughing violently?                            With trouble breathing? No   Do you have choking episodes when you cannot  Get enough air, gasping for air ?              Yes   Do you usually cough when you lie down into  The bed, or when you just lie down to rest ?                          Sometimes depending on what eaten   Do you usually cough after meals or eating?         Yes   Do you cough when (or after) you bend over?    Yes if full   GERD SCORE     Kouffman Reflux v Neurogenic Cough Differentiator Neurogenic   Do you more-or-less cough all day long? Sporadically    Does change of temperature make you cough? Not much   Does laughing or chuckling cause you to cough? yes   Do fumes (perfume, automobile fumes, burned  Toast, etc.,) cause  you to cough ?      Can, esp cooking on grill   Does speaking, singing, or talking on the phone cause you to cough   ?               No    Neurogenic/Airway score      rec Protonix (pantoprazole) 40 mg  Take 30- 60 min before your first and last meals of the day  Continue dymista one twice daily - point toward ear on same side For drainage / throat tickle as need  >>>   take CHLORPHENIRAMINE  4 mg - take one every 4 hours as needed -  For cough >>>   tessilon 200 mg three to four times daily  GERD (REFLUX)   Please remember to go to the x-ray department downstairs in the basement  for your tests - we will call you with the results when they are available.  If not better at 6 weeks you need to return to [facility] - if better then ok to refill meds thru your PCP or return here at 3 months to regroup re need for longterm treatment options - late add:  ? pna in rll/ ? Sarcoid changes also rec:  Return for esr/ cmet/ angiotensin, cbc with diff and rx Augmentin 875 mg take one pill twice daily  X 10 daysAnd  Prednisone 10 mg take  4 each am x 2 days,   2 each am x 2 days,  1 each am x 2 days and stop  And f/u with cxr in 2 weeks =  No change 12/29/16  - Angiotensin  12/18/16   138 with esr 44  - CT 01/05/2017   Extensive partially calcified mediastinal and bilateral hilar lymphadenopathy with extensive perilymphatic nodularity throughout the lungs bilaterally (right greater than left). This spectrum of imaging findings is compatible with the clinically suspected sarcoidosis.       01/06/2017  f/u ov/Wert re: probable sarcoid / ?uacs on gerd rx  Chief Complaint  Patient presents with   Follow-up    Pt hwew to discuss CT scan, He still has occ. sob,he is still coughing which causes him some dizziness,   rash has evolved over several years worse area R neck attributed to reaction to otc cream but noted over other parts of face where no cream was used  Cough is better but still requires rewwq  tessilon All symptoms a lot better (x for the rash) while on short courses of prednisone New problem is painful swelling R elbow s trauma hx  rec Continue protonix and dymista as you are If cough or breathing worse, take prednisone 10 mg x 2 until better then 1 daily x one week then stop  We will call you with appt to see dermatology and orthopedics    - Skin bx 01/09/17 :   Pos granulomatous dermatitis > rec 01/21/2017 start plaquenil 200 mg daily    03/10/2017  f/u ov/Wert re:  Plaquenil x 01/21/17 @ 200 mg daily  Chief Complaint  Patient presents with   Follow-up    Pt states he occ has a "strange sensation" in the right side of his chest. He states he feels tired most of the time, not much energy.    cough x onset 2011 then worse 09/2016 > resolved p prednisone and stayed gone p starting plaquenil as above Skin changes x 2016 also improving on plaquenil and steroid cream per derm  No need for prednisone since starting plaquenil  rec No change plaquenil dose for now  Please schedule a follow up office visit in 6 weeks, call sooner if needed with cxr on return  - consider taper off gerd rx if cough stays gone at next ov      02/25/2018  Pulmonary consultation: Wert re: sarcoidosis on "prn prednisone/ did not maintain on plaquenil / needs clearance for R olecranon bursitis Chief Complaint  Patient presents with   Advice Only    Needing pulm clearance for right elbow surgery. He has occ cough- non prod.     Not limited by breathing from desired activities   Min daytime dry cough attributes to pnds / some better on dymista when can afford it, does not recall resp to 1st gen H1 blockers per guidelines   No ocular complaints  Sleeps flat ok  rec If rash worse > return to dermatology  If nasal symptoms worse >  ent  See your eye doctor yearly    televist 09/06/18 History of Present Illness: 09/06/2018  f/u ov/Wert re: sarcoidosis / skin involvement/rheumatology  rec plaqueninl 200 x  2 rec prednisone 5 mg  Per Ursula Alert x 20 mg x 4 days   Dyspnea:  Not limited by breathing from desired activities  / work out fine Cough: none on dymista /pantoprazole Sleeping: sleep flat  SABA use: none 02: none  rec Since your cough is better on dysmista and protonix and does not flare when you have perceive the sarcoid is getting worse it is unlikely related to your sarcoidosis so continue the dysmista/protonix  Ok to use protonix Take 30-60 min before first meal of the day but at onset of any cough you need to remember to Take 30- 60 min before your first and last meals of the day until cough is gone again I refilled your protonix today for the next 6 months but after that you will need to return here or see your PCP for this (as it's not really a pulmonary issue)  Take prednisone and plaquenil at your rheumatologists direction - the goal for plaquenil is to eventually wean you off of all prednisone.  Once the carona virus restrictions are listed we need to see you back here for PFT's/ ov so let's set this up for 6 months from now     12/08/2018  f/u ov/Wert re:  Sarcoidosis on plaquenil 200 x 2, no pred x months f/u by rheum Chief Complaint  Patient presents with   Follow-up    No co's today   Dyspnea:  Good ex tol Cough: betterp dymista  Sleeping: ok SABA use: none  02: none    No obvious day to day or daytime variability or assoc excess/ purulent sputum or mucus plugs or hemoptysis or cp or chest tightness, subjective wheeze or overt sinus or hb symptoms.   Sleeping  without nocturnal  or early am exacerbation  of respiratory  c/o's or need for noct saba. Also denies any obvious fluctuation of symptoms with weather or environmental changes or other aggravating or alleviating factors except as outlined above   No unusual exposure hx or h/o childhood pna/ asthma or knowledge of premature birth.            OV 02/21/2020 -transfer of care from Dr. Christinia Gully to Dr.  Chase Caller.  History is provided by the patient and review of the chart.  Subjective:  Patient ID: [name], male , DOB: 1975/09/30 , age 21 y.o. , MRN: [id number] , ADDRESS: [address] [place] [address]   02/21/2020 -   Chief Complaint  Patient presents with   Follow-up    no problems     HPI Jaivon Vanbeek 46 y.o. 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  Initially seen by Dr. Asencion Noble and then subsequently by Dr. Christinia Gully.  He says early on he was only treated with as needed prednisone with each course lasting approximately 1 month.  He does not remember being on chronic prednisone.  He has never been on immunomodulators.  He is always had a chronic cough which is a piece together was deemed as secondary to sinus issues also irritable larynx syndrome per review of the chart.  It appears a few years ago a chest x-ray was done and it showed significant pulmonary sarcoidosis.  At  this point in time he had a skin biopsy of chronic lesions on his face and this was in 2018 or so which showed features consistent with sarcoid.  After that somewhere along the way he got established with rheumatology.  He has been recommended to continue to see pulmonary.  Then in February 2021 he was admitted for septic emboli and cavitary pneumonia.  According to the discharge summary no specific diagnosis for the pneumonia is indicated but review of the labs indicate Legionella urine antigen positivity. Visualization of the CT scan shows significant worsening in consolidation particularly in the right upper lobe compared to October 2020.  After this somewhere along the way he went on chronic prednisone.  After going on chronic prednisone multiple symptoms such as excessive daytime somnolence and fatigue have improved although not fully resolved.  He is known to have sleep apnea and the results  of below although he is not seeing a sleep specialist and is not using his CPAP even though has been advised to use 1.  In the past has had cough syncope and also excessive daytime somnolence and fatigue.  At this point in time his overall health status is somewhat better but he still has some residual dyspnea.  He denies any features of collagen vascular disease  Recent evaluation shows mild pulmonary venous hypertension, sleep apnea, significantly abnormal CT scan and elevated ACE level  In his February 2021 admission he was indeterminate for QuantiFERON gold.  His Covid was negative HIV negative cryptococcal antigen titers negative.  However he was Legionella positive    He reports he wants to get to the bottom of his problems    No flowsheet data found.   Ref Range & Units 7 mo ago  QuantiFERON Incubation  Incubation performed.   QuantiFERON-TB Gold Plus Negative Indeterminate    Results for DAELEN, BELVEDERE (MRN [id number]) as of 02/21/2020 16:21  Ref. Range 07/17/2019 16:56  Strep Pneumo Urinary Antigen Latest Ref Range: NEGATIVE  NEGATIVE  L. pneumophila Serogp 1 Ur Ag Latest Ref Range: Negative  Positive (A)  Results for TUVIA, WOODRICK (MRN [id number]) as of 02/21/2020 16:21  - per HPI  Right heart catheterization January 2021 by Dr. Haroldine Laws  Findings:   RA = 9 RV = 36/12 PA = 38/6 (24) PCW = 11 Fick cardiac output/index = 7.1/2.7 PVR = 1.9 WU FA sat = 98% PA sat = 72%, 75% SVC sat = 75%   Assessment:   1. Very mild PAH with normal PVR   Plan/Discussion:   Recommend weight loss and CPAP.    Glori Bickers, MD  8:31 AM   Sleep study March 2021 interpreted by Dr. Golden Hurter -was a CPAP titration study  - An optimal PAP pressure was selected for this patient ( 18 cm of water) - Central sleep apnea was not noted during this titration (CAI = 0.2/h). - Severe oxygen desaturations were observed during this titration (min O2 = 80.0%). - No snoring was  audible during this study. - No cardiac abnormalities were observed during this study. - Clinically significant periodic limb movements were not noted during this study. Arousals associated with PLMs were rare.  Trial of CPAP therapy on 18 cm H2O with a Medium size Resmed Full Face Mask AirFit F20 mask and heated humidification -please not using it   IMPRESSION: CT CHEST FEB 2021 - personally visualized and when compared to October 2020 it is worse -this admission February 2021 labeled as septic emboli and community-acquired pneumonia  with cavitary pneumonia. 1. Unfortunately, there is limited opacification of the pulmonary arteries. There is no evidence of large central pulmonary embolus in the main pulmonary artery or main portions of the right and left pulmonary arteries. However, smaller emboli in the more peripheral branches of the pulmonary arteries cannot be excluded on the basis of this exam. 2. Stable calcified adenopathy is noted consistent with history of sarcoidosis. 3. Interval development of multiple rounded ill-defined opacities throughout both lungs concerning for multifocal pneumonia or septic emboli. The largest measures 9.7 x 8.6 cm in the right lung apex. Cavitary abnormality measuring 5.0 x 4.3 cm is noted in the left lower lobe posteriorly most consistent with cavitary pneumonia. 1.9 cm cavitary abnormality is also noted in the left lower lobe.     Electronically Signed   By: Marijo Conception M.D.   On: 07/16/2019 13:42   Results for ARDIS, FULLWOOD (MRN [id number]) as of 02/21/2020 12:09  Ref. Range 06/21/2010 20:05 12/18/2016 09:35 07/18/2019 11:09  IgE (Immunoglobulin E), Serum Latest Ref Range: 0.0 - 180.0 intl units/mL 27.6    Angiotensin-Converting Enzyme Latest Ref Range: 9 - 67 U/L  138 (H)   Cytoplasmic (C-ANCA) Latest Ref Range: Neg:<1:20 titer   <1:20  P-ANCA Latest Ref Range: Neg:<1:20 titer   <1:20  Atypical P-ANCA titer Latest Ref Range: Neg:<1:20  titer   <1:20   Results for JENARO, SOUDER (MRN [id number]) as of 02/21/2020 16:21  Ref. Range 10/14/2019 00:00 02/03/2020 09:27  Creatinine Latest Ref Range: 0.40 - 1.50 mg/dL 1.1 1.30  Results for DELAN, KSIAZEK (MRN [id number]) as of 02/21/2020 16:21  Ref. Range 07/20/2019 03:45 07/21/2019 04:06 08/01/2019 09:45 10/14/2019 00:00 02/03/2020 09:27  Hemoglobin Latest Ref Range: 13.0 - 17.0 g/dL 12.3 (L) 12.6 (L) 12.3 (L) 14.6 14.7      OV 04/05/2020  Subjective:  Patient ID: [name], male , DOB: 01-01-1976 , age 64 y.o. , MRN: [id number] , ADDRESS: [address] [place] [address] PCP McGowen, Adrian Blackwater, MD Patient Care Team: Tammi Sou, MD as PCP - General (Family Medicine) Ruby Cola, MD as Consulting Physician (Otolaryngology) Chyrel Masson, DO as Consulting Physician (Otolaryngology) Harold Hedge, Darrick Grinder, MD as Consulting Physician (Allergy and Immunology) Tanda Rockers, MD as Consulting Physician (Pulmonary Disease) Marchia Bond, MD as Consulting Physician (Orthopedic Surgery) Rosita Kea, PA-C as Consulting Physician (Rheumatology) Bensimhon, Shaune Pascal, MD as Consulting Physician (Cardiology)  This Provider for this visit: Treatment Team:  Attending Provider: Brand Males, MD    04/05/2020 -   Chief Complaint  Patient presents with   Follow-up    PFT performed today. Pt states he has been doing well since last visit and denies any complaints.     HPI Czar Ysaguirre 46 y.o. - returns for follow-up.  At this point in time he is minimally symptomatic.  He still continues on his prednisone 7 spring of this year.  He says he has had his Covid vaccine but is in need of a booster.  He is yet to see his sleep specialist.  He had pulmonary function test that shows mild restriction to moderate restriction.  But he is essentially asymptomatic from a respiratory standpoint.  He had follow-up CT scan of the chest that shows improvement and resolution  of the Legionella pneumonia that he suffered in February 2021.  However in comparing to old CT scans of the chest - he did NOT have sarcoidosis in 2011 but was present in 2018 and  oct 2020. In both seemed inflammatory but now Oct 2021 seems fibrotic     CT chest high resolution Sept 2021  IMPRESSION: 1. Resolved bilateral multi lobar pneumonia. Residual postinfectious pneumatocele in the right upper lobe. 2. Extensive pulmonary parenchymal findings of sarcoidosis are stable in the interval since 07/16/2019 as detailed. 3. Stable calcified mediastinal and bilateral hilar adenopathy, compatible with sarcoidosis. 4. One vessel coronary atherosclerosis. 5. Symmetric mild bilateral gynecomastia, worsened.     Electronically Signed   By: Ilona Sorrel M.D.   On: 02/28/2020 16:32    ROS - per HPI     has a past medical history of Beta thalassemia minor, Chronic renal insufficiency, stage II (mild), Chronic rhinitis, Cutaneous sarcoidosis, Hoarseness (2015), HTN (hypertension), Hydrocele, LPRD (laryngopharyngeal reflux disease), Obesity, Class II, BMI 35-39.9, OSA (obstructive sleep apnea) (07/28/2019), Peripheral edema, Prediabetes (02/2017), Pulmonary arterial hypertension ([place]), Sarcoidosis of lung ([place]) (12/2016), and Vocal cord polyps (2015).   OV 09/06/2020  Subjective:  Patient ID: [name], male , DOB: 05-26-1976 , age 1 y.o. , MRN: [id number] , ADDRESS: [address] [place] [address] PCP McGowen, Adrian Blackwater, MD Patient Care Team: Tammi Sou, MD as PCP - General (Family Medicine) Sueanne Margarita, MD as PCP - Sleep Medicine (Cardiology) Ruby Cola, MD as Consulting Physician (Otolaryngology) Chyrel Masson, DO as Consulting Physician (Otolaryngology) Harold Hedge, Darrick Grinder, MD as Consulting Physician (Allergy and Immunology) Tanda Rockers, MD as Consulting Physician (Pulmonary Disease) Marchia Bond, MD as Consulting Physician (Orthopedic  Surgery) Rosita Kea, PA-C as Consulting Physician (Rheumatology) Bensimhon, Shaune Pascal, MD as Consulting Physician (Cardiology)  This Provider for this visit: Treatment Team:  Attending Provider: Brand Males, MD    09/06/2020 -   Chief Complaint  Patient presents with   Follow-up    Doing ok, felt CBD oil helped with SOB  #SARCOID - he did NOT have sarcoidosis in 2011 but was present in 2018 and oct 2020. In both seemed inflammatory but now se/pOct 2021 seems fibrotic (per radiology: oct 2020 -> Sept 2021 there is very mild worsening of scar tissue esp in the right upper side and this could just represent the scar from the lgionella pneumoa in feb t)  - on monitoring approach  # Legionella pneumonia that he suffered in February 2021.  ->residual pneumatociele Sept/Oct 2021 CT    #Lupus  HPI Deveron Nelon 46 y.o. -returns for follow-up.  Last visit was in the fall 2021.  At that time there is some concern about whether he was having progressive burned-out fibrotic lung disease from his sarcoid.  Radiology given opinion that he might be having that.  So we decided to get a pulmonary function test.  Today's pulmonary function test shows slight decline.  But the range is extremely small.  Meanwhile he tells me this is lupus Perni is active the last few to several weeks his lupus pernio on the face and forearms have gotten worse.  Also worse in the shin possibly.  He saw rheumatology Marella Chimes.  Some 3 weeks ago was placed on methotrexate 15 mg once a week.  Today he also increased his prednisone 5 5 mg to 40 mg because of his arthropathy and skin lesions.  He has upcoming appointment with dermatology in the middle of April 2022.  But from a breathing standpoint he is better/stable.  He said he took CBD oil and this helped him.  He plans to go back to it.  OV 04/02/2021  Subjective:  Patient ID: [name], male , DOB: 01-15-1976 , age 85 y.o. , MRN: [id number] , ADDRESS:  [address] [place] [address] PCP McGowen, Adrian Blackwater, MD Patient Care Team: Tammi Sou, MD as PCP - General (Family Medicine) Sueanne Margarita, MD as PCP - Sleep Medicine (Cardiology) Ruby Cola, MD as Consulting Physician (Otolaryngology) Chyrel Masson, DO as Consulting Physician (Otolaryngology) Harold Hedge, Darrick Grinder, MD as Consulting Physician (Allergy and Immunology) Tanda Rockers, MD as Consulting Physician (Pulmonary Disease) Marchia Bond, MD as Consulting Physician (Orthopedic Surgery) Marvene Staff (Inactive) as Consulting Physician (Rheumatology) Bensimhon, Shaune Pascal, MD as Consulting Physician (Cardiology)  This Provider for this visit: Treatment Team:  Attending Provider: Brand Males, MD #  ? OSA  #SARCCOID  - ILD - he did NOT have sarcoidosis in 2011 but was present in 2018 and oct 2020. In both seemed inflammatory but now se/pOct 2021 seems fibrotic (per radiology: oct 2020 -> Sept 2021 there is very mild worsening of scar tissue esp in the right upper side and this could just represent the scar from the lgionella pneumoa in feb t)  - #Lupurs pernio   #arhtropathy  # Legionella pneumonia that he suffered in February 2021.  ->residual pneumatociele Sept/Oct 2021 CT   #Right heart catheterization January 2021 with very mild PAH with normal PVR -Dr. Haroldine Laws.  PVR 1.9 work units, pulmonary capillary wedge pressure 11, pulmonary artery mean pressure 24 -did not meet criteria for inhaled treprostinil  #Immunosuppressed with chronic prednisone 5 mg/day, methotrexate 50 mg/week and Plaquenil 4 mg/day. -Treatment onset early 2022  04/02/2021 -   Chief Complaint  Patient presents with   Follow-up    Pt states he has been doing okay since last visit and denies any real complaints.     HPI Montarius Kitagawa 46 y.o. -returns for follow-up.  Last seen earlier in 2022.  Then last month he saw a nurse practitioner with bronchitis and given  antibiotic and prednisone.  He feels back to baseline.  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  He was also not taking his methotrexate and folic acid and Plaquenil.  Our nurse practitioner refill these.  He is still yet established with [facility] rheumatology.  I have messaged [name].  I also had him call [facility] rheumatology while waiting in our office so that he can make a scheduled appointment.  I emphasized to him the need for compliance particularly with his immunosuppressed status.  He says after going back on his immunomodulators he is actually feeling better.  He had a CT scan of the chest that shows progressive sarcoid findings in the lung compared to 1 year earlier [facility] also has pulmonary hypertension.  He is going to see Dr. Haroldine Laws early January 2023.  His last echo was over a year ago.  He had a cath in January 2021 and just showed mild elevation pulmonary pressures but did not meet criteria for inhaled treprostinil.  He has not had any recent lab work with him being on immunomodulators.  I expressed some concern about progressive lung findings.  He has not had pulmonary function test.  Unclear why.    CT Chest data - HRCT 02/22/21  Narrative & Impression  CLINICAL DATA:  Sarcoidosis   EXAM: CT CHEST WITHOUT CONTRAST   TECHNIQUE: Multidetector CT imaging of the chest was performed following the  standard protocol without intravenous contrast. High resolution imaging of the lungs, as well as inspiratory and expiratory imaging, was performed.   COMPARISON:  02/28/2020, 07/16/2019, 03/16/2019, 01/05/2017, 04/19/2010   FINDINGS: Cardiovascular: No significant vascular findings. Normal heart size. No pericardial effusion. Enlargement of the main pulmonary artery, measuring up to 3.9 cm in caliber.   Mediastinum/Nodes: Enlarged, coarsely calcified lymph nodes throughout the mediastinum and hila.  Thyroid gland, trachea, and esophagus demonstrate no significant findings.   Lungs/Pleura: Redemonstrated very extensive fine nodularity throughout the lungs, many of which are confluent in consolidations and concentrated along the pleural surfaces in fissures and with an upper lobe predominance. Extensive architectural distortion, particularly of the right upper lobe. No significant air trapping on expiratory phase imaging. 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 No significant change in a septated, cavitary, masslike consolidation of the perihilar right lung (series 10, image 141). There is significantly increased subpleural consolidation of the peripheral left lower lobe, largest cross-section measuring 2.9 x 1.6 cm (series 10, image 211). Increased consolidation posteriorly in the left upper lobe abutting the fissure, largest cross-sectional area measuring 3.5 x 2.2 cm (series 10, image 90). Increased consolidation peripherally in the superior segment right lower lobe, greatest cross-sectional area measuring 3.4 x 2.2 cm (series 10, image 171). No pleural effusion or pneumothorax.   Upper Abdomen: No acute abnormality.   Musculoskeletal: No chest wall mass or suspicious bone lesions identified.   IMPRESSION: 1. Redemonstrated very extensive fine nodularity throughout the lungs, many confluent in consolidations and concentrated along the pleural surfaces and fissures and with an upper lobe predominance. Architectural distortion, particularly of the right upper lobe. No significant change in a septated, cavitary, masslike consolidation of the perihilar right lung. Multiple areas of increased consolidation in the bilateral lungs. 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 2. Enlarged, coarsely calcified lymph nodes throughout the mediastinum and hila, consistent with nodal sarcoidosis. 3. Enlargement of the main  pulmonary artery, measuring up to 3.9 cm in caliber, as can be seen in pulmonary hypertension.     Electronically Signed   By: Eddie Candle M.D.   On: 02/24/2021 09:28       05/28/2021 Acute OV  Patient presents for an acute office visit.  Patient complains over the last 2 weeks he has had increased cough, congestion thick mucus and shortness of breath.  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  Unfortunately he lost his methotrexate 2 weeks ago.  He is on prednisone currently at 10 mg.  Patient says his breathing has not been doing as well.  Patient does has underlying sleep apnea.  Has not been wearing his CPAP.  Once again we reviewed the dangers of untreated sleep apnea.  Patient also has been using some CBD oil under his tongue which she says occasionally does help his breathing.  Patient denies any fever, chest pain, orthopnea, edema. Patient continues to follow with rheumatology and dermatology. Patient says that his skin lesion were improving but over the last 2 weeks they have been flaring again.  OV 07/05/2021  Subjective:  Patient ID: [name], male , DOB: [birth year] , age 42 y.o. , MRN: [id number] , ADDRESS: [address] [place] [address] PCP McGowen, Adrian Blackwater, MD Patient Care Team: Tammi Sou, MD as PCP - General (Family Medicine) Sueanne Margarita, MD as PCP - Sleep Medicine (Cardiology) Ruby Cola, MD as Consulting Physician (Otolaryngology) Chyrel Masson, DO  as Consulting Physician (Otolaryngology) Harold Hedge, Darrick Grinder, MD as Consulting Physician (Allergy and Immunology) Tanda Rockers, MD as Consulting Physician (Pulmonary Disease) Marchia Bond, MD as Consulting Physician (Orthopedic Surgery) Marvene Staff (Inactive) as Consulting Physician (Rheumatology) Bensimhon, Shaune Pascal, MD as Consulting Physician (Cardiology)  This Provider for this visit: Treatment Team:  Attending Provider: Brand Males, MD    07/05/2021 -   Chief Complaint  Patient presents with   Follow-up    PFT performed today.  Pt states he has been doing okay since last visit and denies any complaints.   #SARCCOID  - ILD - he did NOT have sarcoidosis in 2011 but was present in 2018 and oct 2020. In both seemed inflammatory but now se/pOct 2021 seems fibrotic (per radiology: oct 2020 -> Sept 2021 there is very mild worsening of scar tissue esp in the right upper side and this could just represent the scar from the lgionella pneumoa in feb t)  - #Lupurs pernio   #arhtropathy  # Legionella pneumonia that he suffered in February 2021.  ->residual pneumatociele Sept/Oct 2021 CT   #Right heart catheterization January 2021 with very mild PAH with normal PVR -Dr. Haroldine Laws.  PVR 1.9 work units, pulmonary capillary wedge pressure 11, pulmonary artery mean pressure 24 -did not meet criteria for inhaled treprostinil  #Immunosuppressed with chronic prednisone 5 mg/day, methotrexate 50 mg/week and Plaquenil 4 mg/day. -Treatment onset early 2022  #OSA on CPAP - Poor compliance noted on 05/28/2021 office visit  HPI Andi Wescoat 46 y.o. -returns for follow-up.  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  He has had pulmonary function test.  His pulmonary function test was stable between October 2021 in March 2022.  However since then there is a 5% decline in DLCO and a 3% decline in FVC.  However he is not feeling it.  He feels stable.  I asked him about his recent respiratory infection and relationship to methotrexate noncompliance.  He says he just ran out and was coincidental.  He is back to his baseline after Patricia Nettle treated him.  He continues on methotrexate prednisone and Plaquenil.  This originally prescribed by Dr. Leigh Aurora rheumatologist.  I remember talking to [name] myself and try to get the patient scheduled.  However he  says he has not scheduled appointment with him.  He does not know why.  In any event because of progression in the pulmonary function test I did indicate that in correlation with the CT progression that there might be an indication here for additional therapy such as immunosuppressants through [name] or even nintedanib through [place].  He had about the side effects of nintedanib which is not immunosuppressant.  He does not want to do it.  However he is agreed for close follow-up.  He is working on losing weight these are      PFT  PFT Results Latest Ref Rng & Units 07/05/2021 09/06/2020 07/24/2020 04/05/2020  FVC-Pre L 2.96 3.04 3.04 3.30  FVC-Predicted Pre % 60 61 61 66  FVC-Post L 3.06 3.29 3.29 3.38  FVC-Predicted Post % 62 66 66 68  Pre FEV1/FVC % % 71 70 70 75  Post FEV1/FCV % % 72 75 75 77  FEV1-Pre L 2.11 2.13 2.13 2.48  FEV1-Predicted Pre % 53 53 53 62  FEV1-Post L 2.19 2.48 2.48 2.60  DLCO uncorrected ml/min/mmHg 23.39 24.55 24.55  25.13  DLCO UNC% % 68 72 72 73  DLCO corrected ml/min/mmHg 23.39 24.55 24.55 25.13  DLCO COR %Predicted % 68 72 72 73  DLVA Predicted % 130 140 140 121  TLC L 4.62 4.72 4.72 4.87  TLC % Predicted % 59 60 60 62  RV % Predicted % 76 73 73 45    Echo 06/13/21      1. Left ventricular ejection fraction, by estimation, is 65 to 70%. The  left ventricle has normal function. The left ventricle has no regional  wall motion abnormalities. Left ventricular diastolic parameters are  consistent with Grade I diastolic  dysfunction (impaired relaxation).   2. Right ventricular systolic function is normal. The right ventricular  size is normal. Tricuspid regurgitation signal is inadequate for assessing  PA pressure.   3. The mitral valve is grossly normal. No evidence of mitral valve  regurgitation.   4. The aortic valve is tricuspid. Aortic valve regurgitation is not  visualized.   5. The inferior vena cava is normal in size with greater than 50%   respiratory variability, suggesting right atrial pressure of 3 mmHg.   Comparison(s): Changes from prior study are noted. 06/22/2019: LFEF 60-65%.    has a past medical history of Beta thalassemia minor, Chronic renal insufficiency, stage II (mild), Chronic rhinitis, Cutaneous sarcoidosis, Hoarseness (2015), HTN (hypertension), Hydrocele, LPRD (laryngopharyngeal reflux disease), Obesity, Class II, BMI 35-39.9, OSA (obstructive sleep apnea) (07/28/2019), Peripheral edema, Prediabetes (02/2017), Pulmonary arterial hypertension ([place]), Sarcoidosis of lung ([place]) (12/2016), and Vocal cord polyps (2015).   reports that he quit smoking about 26 years ago. His smoking use included cigarettes. He has a 0.45 pack-year smoking history. He has never used smokeless tobacco.  Past Surgical History:  Procedure Laterality Date   OLECRANON BURSECTOMY Right 2019   Polysomnogram  07/28/2019   Severe OSA w/hypoxia, mild central sleep apnea, CPAP recommended (Dr. Fransico Him)   RIGHT HEART CATH N/A 06/27/2019   Mild PAH. Procedure: RIGHT HEART CATH;  Surgeon: Jolaine Artist, MD;  Location: [facility];  Service: Cardiovascular;  Laterality: N/A;   SKIN BIOPSY  2018   Sarcoidosis of skin (also has pulm involvement).   SKIN GRAFT  2010   on left index finger   TRANSTHORACIC ECHOCARDIOGRAM  06/22/2019   NORMAL   Vocal cord surgery  2016   [facility]   WISDOM TOOTH EXTRACTION  2005    Allergies  Allergen Reactions   Wasp Venom Swelling    Immunization History  Administered Date(s) Administered   Influenza,inj,Quad PF,6+ Mos 02/21/2020, 04/02/2021   Influenza-Unspecified 04/08/2019   PFIZER(Purple Top)SARS-COV-2 Vaccination 08/20/2019, 09/10/2019   Pneumococcal Polysaccharide-23 08/01/2019   Tdap 04/20/2018    Family History  Problem Relation Age of Onset   Sarcoidosis Father        ?   Lactose intolerance Father    Other Mother        colon issues-portion of colon removed   Lactose  intolerance Mother    Cancer Paternal Grandfather        ?     Current Outpatient Medications:    amLODipine (NORVASC) 10 MG tablet, Take 1 tablet (10 mg total) by mouth daily., Disp: 90 tablet, Rfl: 3   Azelastine-Fluticasone 137-50 MCG/ACT SUSP, Place 2 sprays into both nostrils daily., Disp: , Rfl:    clobetasol ointment (TEMOVATE) 0.05 %, Apply topically., Disp: , Rfl:    Clocortolone Pivalate (CLODERM) 0.1 % cream, Apply 1 application topically 2 (  two) times daily as needed (Sarcoidosis/rash). , Disp: , Rfl:    folic acid (FOLVITE) 1 MG tablet, Take 1 tablet (1 mg total) by mouth daily., Disp: 30 tablet, Rfl: 5   hydrocortisone butyrate (LUCOID) 0.1 % CREA cream, Apply 1 application topically 2 (two) times daily. (Patient taking differently: Apply 1 application topically 2 (two) times daily. As needed), Disp: 60 g, Rfl: 6   hydroxychloroquine (PLAQUENIL) 200 MG tablet, Take 400 mg by mouth daily., Disp: , Rfl:    hydrOXYzine (ATARAX/VISTARIL) 10 MG tablet, TAKE 1 TO 3 TABLETS BY MOUTH EVERY 4 TO 6 HOURS AS NEEDED FOR ITCHING, Disp: , Rfl:    ipratropium (ATROVENT) 0.03 % nasal spray, Place 2 sprays into both nostrils every 12 (twelve) hours., Disp: 30 mL, Rfl: 12   levocetirizine (XYZAL) 5 MG tablet, SMARTSIG:1 Tablet(s) By Mouth Every Evening, Disp: , Rfl:    losartan (COZAAR) 50 MG tablet, TAKE 1 TABLET(50 MG) BY MOUTH DAILY, Disp: 90 tablet, Rfl: 3   methotrexate (RHEUMATREX) 2.5 MG tablet, Take 15 mg by mouth once a week., Disp: , Rfl:    metoprolol succinate (TOPROL-XL) 25 MG 24 hr tablet, Take 1.5 tablets (37.5 mg total) by mouth daily., Disp: 45 tablet, Rfl: 3   pantoprazole (PROTONIX) 40 MG tablet, TAKE 1 TABLET BY MOUTH 30 TO 60 MINUTES BEFORE YOUR FIRST AND LAST MEALS OF THE DAY, Disp: 180 tablet, Rfl: 3   predniSONE (DELTASONE) 10 MG tablet, Take 10 mg by mouth daily with breakfast., Disp: , Rfl:    spironolactone (ALDACTONE) 25 MG tablet, Take 1 tablet (25 mg total) by mouth  daily., Disp: 90 tablet, Rfl: 3   triamcinolone cream (KENALOG) 0.1 %, SMARTSIG:1 Application Topical 2-3 Times Daily, Disp: , Rfl:       Objective:   Vitals:   07/05/21 1559  BP: 130/80  Pulse: 92  Temp: 98.2 F (36.8 C)  TempSrc: Oral  SpO2: 98%  Weight: 295 lb (133.8 kg)  Height: _0  (1.88 m)    Estimated body mass index is 37.88 kg/m as calculated from the following:   Height as of this encounter: _1  (1.88 m).   Weight as of this encounter: 295 lb (133.8 kg).  _2 @  Filed Weights   07/05/21 1559  Weight: 295 lb (133.8 kg)     Physical Exam    General: No distress. obese Neuro: Alert and Oriented x 3. GCS 15. Speech normal Psych: Pleasant Resp:  Barrel Chest - no.  Wheeze - no, Crackles - no, No overt respiratory distress CVS: Normal heart sounds. Murmurs - no Ext: Stigmata of Connective Tissue Disease - no HEENT: Normal upper airway. PEERL +. No post nasal drip        Assessment:       ICD-10-CM   1. Sarcoidosis  D86.9 Pulmonary function test    2. Immunosuppressed status ([place])  D84.9     3. Current chronic use of systemic steroids  Z79.52          Plan:     Patient Instructions  Sarcoidosis with skin and lung involvement   - Lung involvement from sarcoid is worse sept 2021 -> sept 2022 - PFT 5% worse March 2022 -> Jan 2023  - progession could be related to running out of your medications in past -NOt sure why you are not re-established with Dr Amil Amen as yet   Plan  - continue daily plaquenil 482m gaily - continue prednisone 510mdaily - continue methotrexate  15 mg once a week  - restart  continue folic acid 7m daily   -Normally prescribed by Dr. BAmil Amen - do full PFT in 3 months  - depending on progressiion might need add-on medications  Follow-up -Return to see Dr. RChase Callerin 3 months - 30 min visit buafter PFT  - ILD symptom score and walk test at followup    -Follow-up eval for also potential Bactrim  indication     SIGNATURE    Dr. MBrand Males M.D., F.C.C.P,  Pulmonary and Critical Care Medicine Staff Physician, CM[place]Director - Interstitial Lung Disease  Program  [place] [address] Pager: [phone] If no answer or between  15:00h - 7:00h: call [phone] Telephone: [phone]  5:29 PM 07/05/2021

## 2021-07-05 NOTE — Patient Instructions (Signed)
Full PFT performed today. °

## 2021-07-05 NOTE — Patient Instructions (Addendum)
Sarcoidosis with skin and lung involvement   - Lung involvement from sarcoid is worse sept 2021 -> sept 2022 - PFT 5% worse March 2022 -> Jan 2023  - progession could be related to running out of your medications in past -NOt sure why you are not re-established with Dr Amil Amen as yet   Plan  - continue daily plaquenil 415m gaily - continue prednisone 5107mdaily - continue methotrexate 15 mg once a week  - restart  continue folic acid 49m27maily   -Normally prescribed by Dr. BeeAmil Amen do full PFT in 3 months  - depending on progressiion might need add-on medications  Follow-up -Return to see Dr. RamChase Caller 3 months - 30 min visit buafter PFT  - ILD symptom score and walk test at followup    -Follow-up eval for also potential Bactrim indication

## 2021-09-18 ENCOUNTER — Telehealth: Payer: Self-pay | Admitting: Family Medicine

## 2021-09-18 ENCOUNTER — Encounter: Payer: Self-pay | Admitting: Family Medicine

## 2021-09-18 NOTE — Telephone Encounter (Signed)
Yes, send letter.  Thanks. ?

## 2021-09-18 NOTE — Telephone Encounter (Signed)
Printing dismissal letter ?

## 2021-09-18 NOTE — Telephone Encounter (Signed)
FYI  Please see below

## 2021-09-18 NOTE — Telephone Encounter (Signed)
Please advise if you would like me to send final warning for no shows or dismissal letter. ? ?08/03/20 late for appt & had to reschedule ?11/21/20 no show ?06/11/21 no show & has not rescheduled ? ? ?

## 2021-09-28 IMAGING — CT CT MAXILLOFACIAL W/O CM
3 series · 14 of 47 positions shown, 16 images · non-contrast
Comparison: None.

CLINICAL DATA: Cough. Chronic sinus complaints.

EXAM:
CT MAXILLOFACIAL WITHOUT CONTRAST
TECHNIQUE: Multidetector CT imaging of the maxillofacial structures was
performed. Multiplanar CT image reconstructions were also generated.

[Series 3: max soft · axial · 0.33mm/px · z∈[+1074,+1264]mm · 8 of 111 slices shown, 10 images]
[im 8/111  brain]
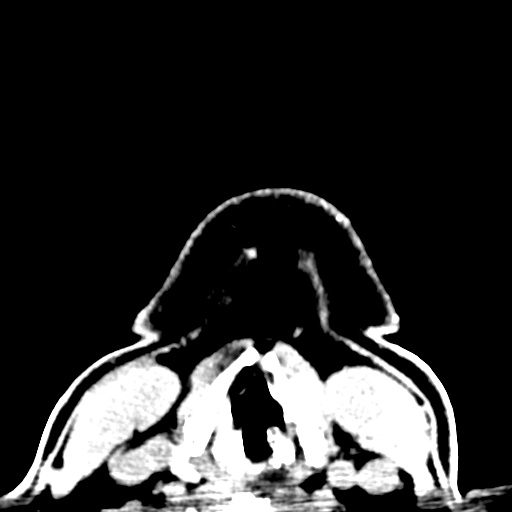
[im 8/111  bone]
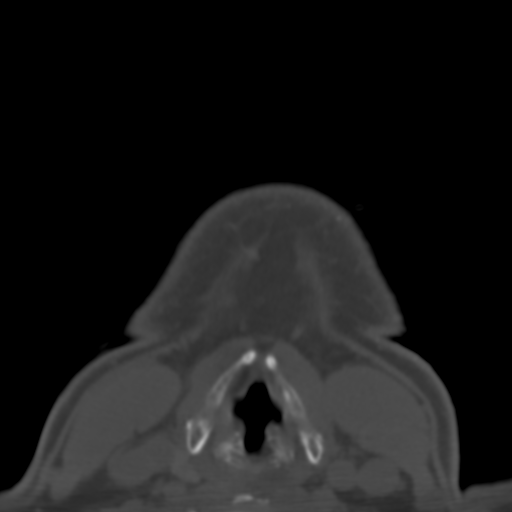
[im 23/111  bone]
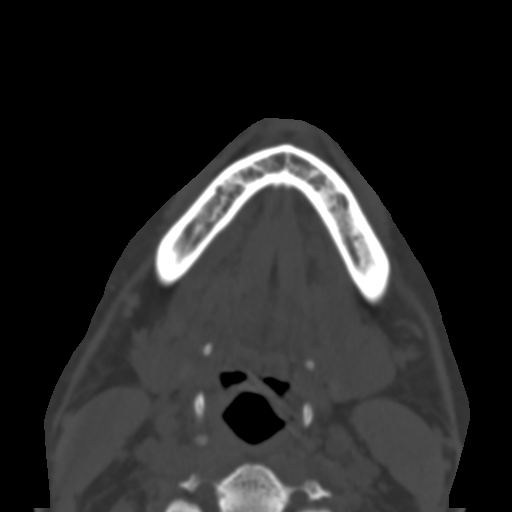
[im 35/111  bone]
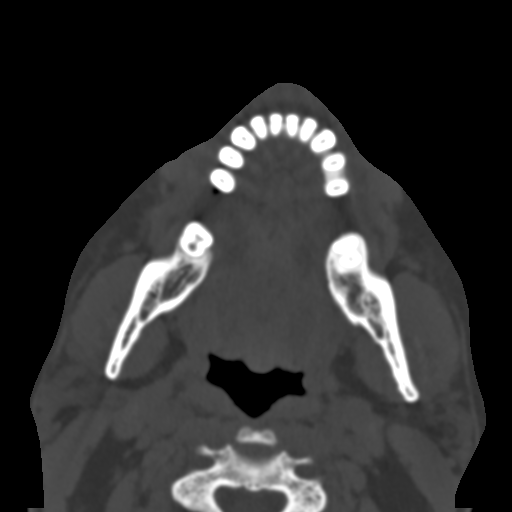
[im 50/111  bone]
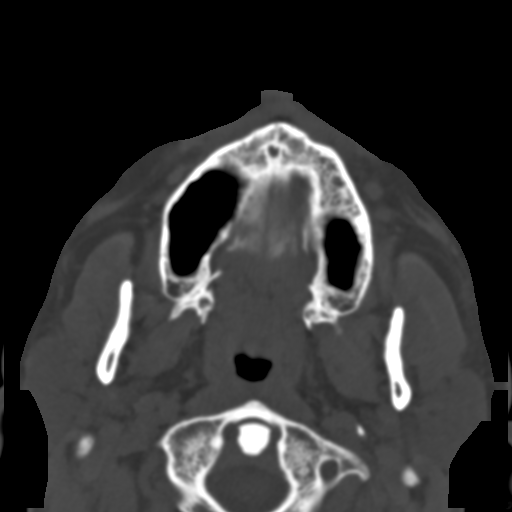
[im 61/111  brain]
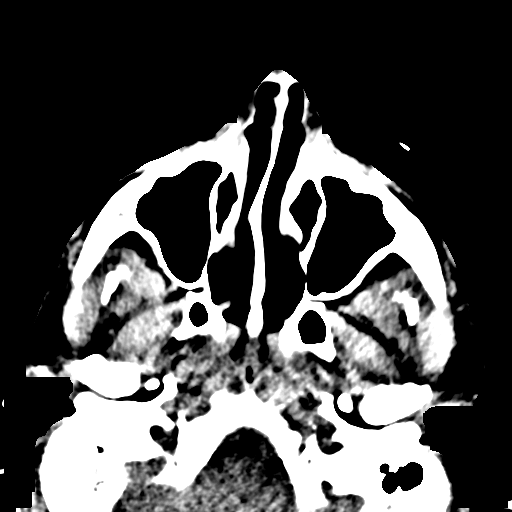
[im 61/111  bone]
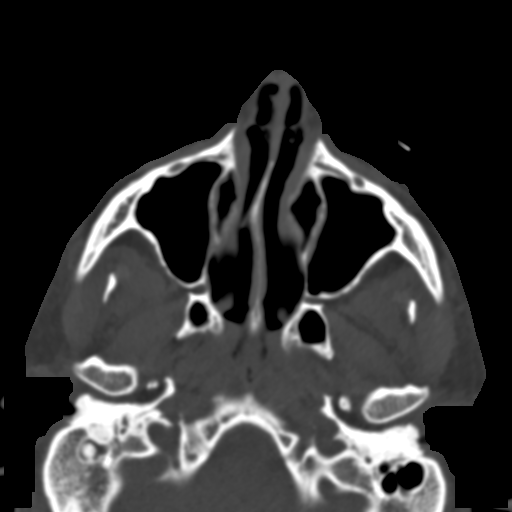
[im 76/111  bone]
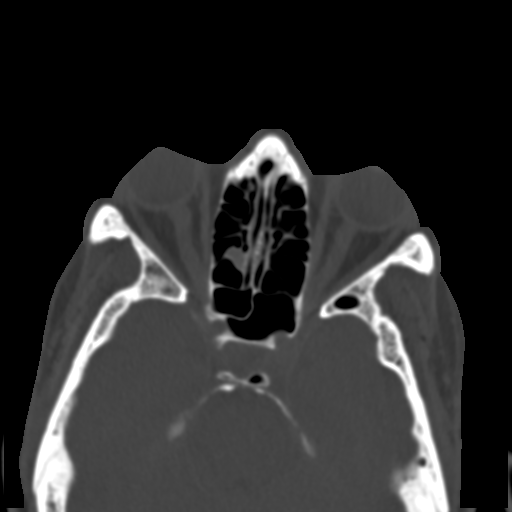
[im 88/111  bone]
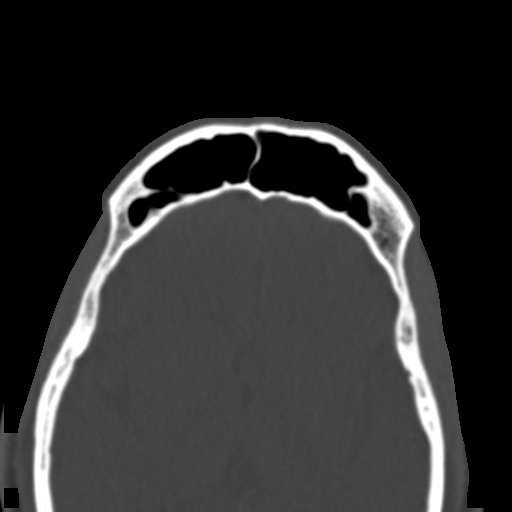
[im 103/111  bone]
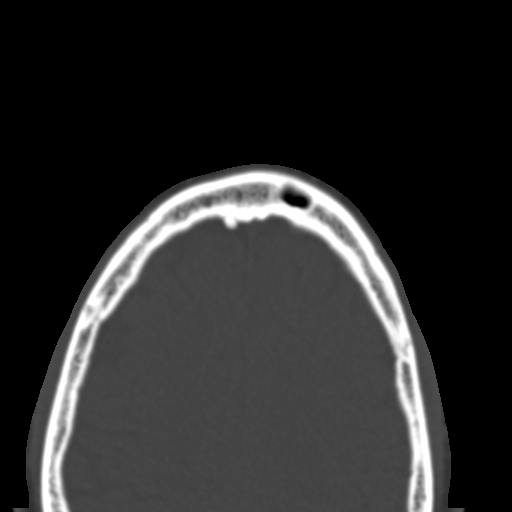

[Series 7: coronal soft · coronal · 0.34mm/px · 3 of 77 slices shown]
[im 26/77  bone]
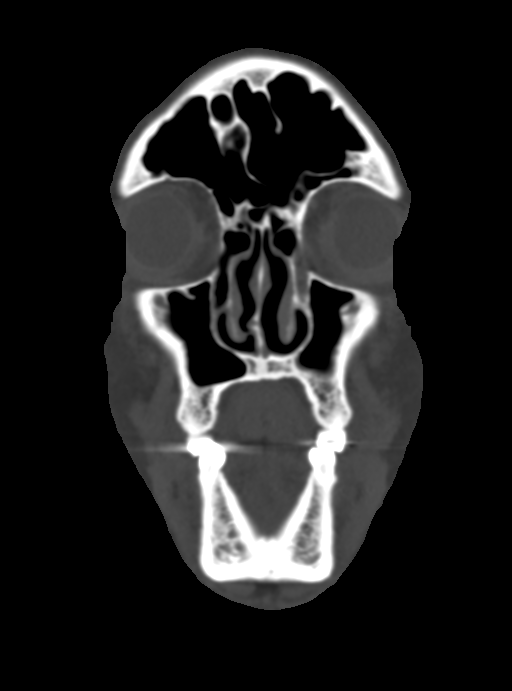
[im 34/77  bone]
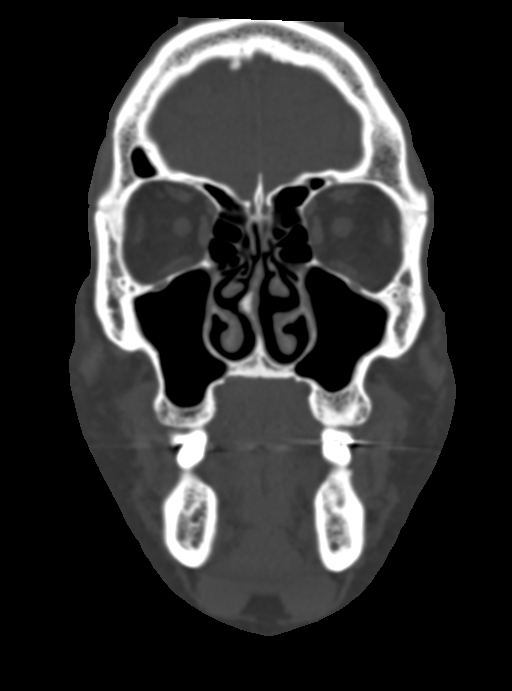
[im 43/77  bone]
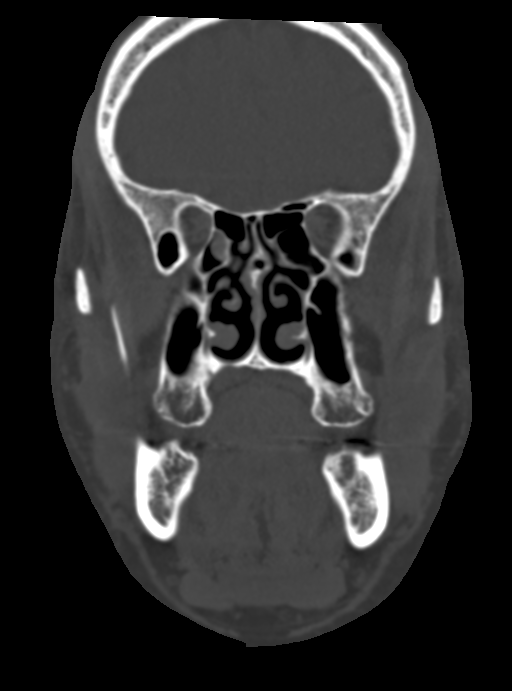

[Series 8: sagittal soft · sagittal · 0.30mm/px · 3 of 88 slices shown]
[im 30/88  bone]
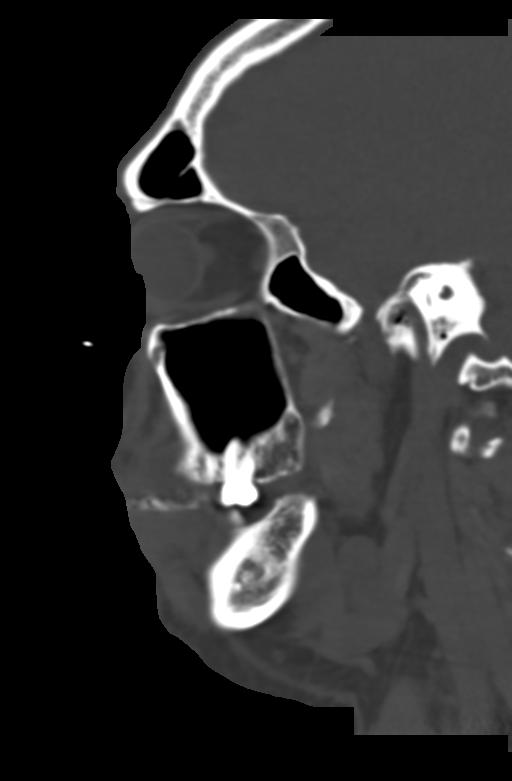
[im 44/88  bone]
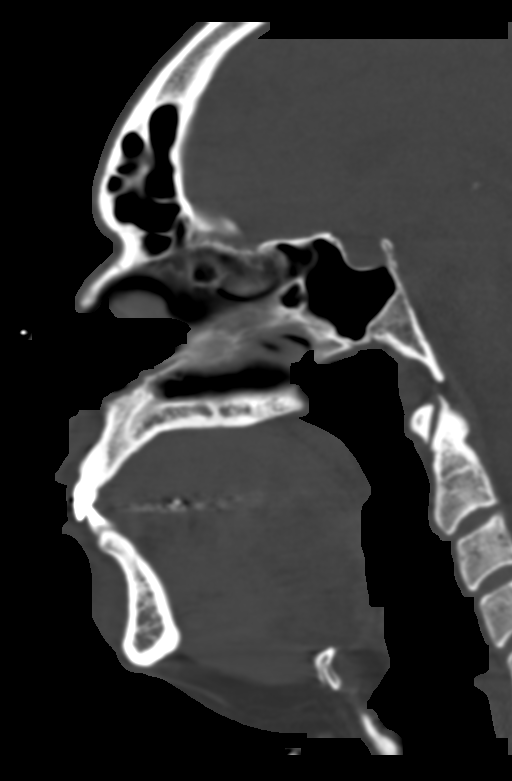
[im 59/88  bone]
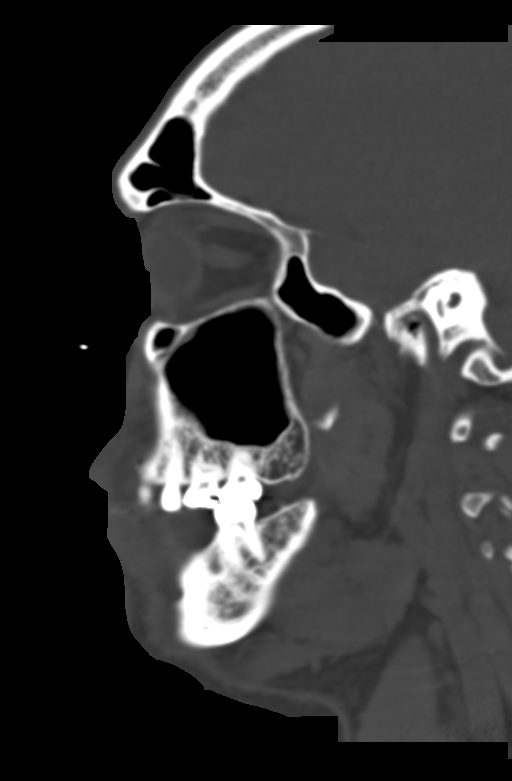

[14 of 47 positions shown; findings below may reference images not displayed]

FINDINGS: Osseous: No fracture or destructive osseous process.

Orbits: Mild bilateral exophthalmos. No orbital mass or
inflammation.

Sinuses: Mild posterior right ethmoid air cell mucosal thickening
and trace left maxillary sinus mucosal thickening. No sinus fluid.
Patent ostiomeatal units. 4 mm rightward nasal septal deviation.
Paradoxical rotation of the middle turbinates. Clear mastoid air
cells and tympanic cavities.

Soft tissues: Unremarkable.

Limited intracranial: Unremarkable.
IMPRESSION: 1. Minimal paranasal sinus mucosal thickening.  No fluid.
2. Rightward nasal septal deviation.

## 2021-10-01 ENCOUNTER — Encounter: Payer: Self-pay | Admitting: Internal Medicine

## 2021-10-01 ENCOUNTER — Ambulatory Visit (INDEPENDENT_AMBULATORY_CARE_PROVIDER_SITE_OTHER): Payer: PRIVATE HEALTH INSURANCE | Admitting: Internal Medicine

## 2021-10-01 ENCOUNTER — Telehealth (INDEPENDENT_AMBULATORY_CARE_PROVIDER_SITE_OTHER): Payer: PRIVATE HEALTH INSURANCE | Admitting: Internal Medicine

## 2021-10-01 DIAGNOSIS — Z7952 Long term (current) use of systemic steroids: Secondary | ICD-10-CM

## 2021-10-01 DIAGNOSIS — D869 Sarcoidosis, unspecified: Secondary | ICD-10-CM

## 2021-10-01 LAB — PULMONARY FUNCTION TEST
DL/VA % pred: 120 %
DL/VA: 5.36 ml/min/mmHg/L
DLCO cor % pred: 58 %
DLCO cor: 19.66 ml/min/mmHg
DLCO unc % pred: 58 %
DLCO unc: 19.66 ml/min/mmHg
FEF 25-75 Post: 1.57 L/sec
FEF 25-75 Pre: 0.81 L/sec
FEF2575-%Change-Post: 95 %
FEF2575-%Pred-Post: 40 %
FEF2575-%Pred-Pre: 20 %
FEV1-%Change-Post: 18 %
FEV1-%Pred-Post: 52 %
FEV1-%Pred-Pre: 44 %
FEV1-Post: 2.08 L
FEV1-Pre: 1.75 L
FEV1FVC-%Change-Post: 12 %
FEV1FVC-%Pred-Pre: 78 %
FEV6-%Change-Post: 5 %
FEV6-%Pred-Post: 60 %
FEV6-%Pred-Pre: 57 %
FEV6-Post: 2.89 L
FEV6-Pre: 2.74 L
FEV6FVC-%Change-Post: 0 %
FEV6FVC-%Pred-Post: 101 %
FEV6FVC-%Pred-Pre: 101 %
FVC-%Change-Post: 5 %
FVC-%Pred-Post: 59 %
FVC-%Pred-Pre: 56 %
FVC-Post: 2.91 L
FVC-Pre: 2.77 L
Post FEV1/FVC ratio: 71 %
Post FEV6/FVC ratio: 99 %
Pre FEV1/FVC ratio: 63 %
Pre FEV6/FVC Ratio: 99 %
RV % pred: 75 %
RV: 1.63 L
TLC % pred: 57 %
TLC: 4.47 L

## 2021-10-01 NOTE — Progress Notes (Signed)
? ? ? 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 ? ?History of Present Illness  ?12/11/2016 1st [facility] office visit/ [facility]   ?Chief Complaint  ?Patient presents with  ? Pulmonary Consult  ?  Self referral. Pt c/o increased cough and SOB over the past 3 months. He states his cough is not really productive. It never bothers him at night. He sometimes coughs until the point he feels lightheaded, and has "passed out" once before due to cough.   ? cough x 6 years  ?Citric drinks make it worse  ?Allergy eval  cedar allergy  ?nexium as walking the door  ? ? ? ?Kouffman Reflux v Neurogenic Cough Differentiator Reflux Comments  ?Do you awaken from a sound sleep coughing violently?                            ?With trouble breathing? No   ?Do you have choking episodes when you cannot  Get enough air, gasping for air ?             ? Yes   ?Do you usually cough when you lie down into  The bed, or when you just lie down to rest ?                         ? Sometimes depending on what eaten   ?Do you usually cough after meals or eating?         Yes   ?Do you cough when (or after) you bend over?    Yes if full   ?GERD SCORE     ?Kouffman Reflux v Neurogenic Cough Differentiator Neurogenic   ?Do you more-or-less cough all day long? Sporadically    ?Does change of temperature make you cough? Not much   ?Does laughing or chuckling cause you to cough? yes   ?Do fumes (perfume, automobile fumes, burned  Toast, etc.,) cause  you to cough ?      Can, esp cooking on grill   ?Does speaking, singing, or talking on the phone cause you to cough   ?              ? No    ?Neurogenic/Airway score    ?  ?rec ?Protonix (pantoprazole) 40 mg  Take 30- 60 min before your first and last meals of the day  ?Continue dymista one twice daily - point toward ear on same side ?For drainage / throat tickle as need  >>>   take CHLORPHENIRAMINE  4 mg - take one every 4 hours as needed -  ?For cough >>>   tessilon 200 mg three to four times daily  ?GERD (REFLUX)  ? Please remember to go to the x-ray department downstairs in the basement  for your tests - we will call you with the results when they are available. ?If  not better at 6 weeks you need to return to [facility] - if better then ok to refill meds thru your PCP or return here at 3 months to regroup re need for longterm treatment options ?- late add:  ? pna in rll/ ? Sarcoid changes also rec:  Return for esr/ cmet/ angiotensin, cbc with diff and rx Augmentin 875 mg take one pill twice daily  X 10 daysAnd  Prednisone 10 mg take  4 each am x 2 days,   2 each am x 2 days,  1 each am x 2 days and stop  ?And f/u with cxr in 2 weeks =  No change 12/29/16  ?- Angiotensin  12/18/16   138 with esr 44  ?- CT 01/05/2017   ?Extensive partially calcified mediastinal and bilateral hilar ?lymphadenopathy with extensive perilymphatic nodularity throughout ?the lungs bilaterally (right greater than left). This spectrum of ?imaging findings is compatible with the clinically suspected ?sarcoidosis. ?   ? ? ? ?01/06/2017  f/u ov/[facility]ert re: probable sarcoid / ?uacs on gerd rx  ?Chief Complaint  ?Patient presents with  ? Follow-up  ?  Pt hwew to discuss CT scan, He still has occ. sob,he is still coughing which causes him some dizziness,   ?rash has evolved over several years worse area R neck attributed to reaction to otc cream but noted over other parts of face where no cream was used  ?Cough is better but still requires rewwq  tessilon ?All symptoms a lot better (x for the rash) while on short courses of prednisone ?New problem is painful swelling R elbow s trauma hx  ?rec ?Continue protonix and dymista as you are ?If cough or breathing worse, take prednisone 10 mg x 2 until better then 1 daily x one week then stop  ?We will call you with appt to see dermatology and orthopedics  ? ? ?- Skin bx 01/09/17 :   Pos granulomatous dermatitis > rec 01/21/2017 start plaquenil 200 mg daily  ? ? ?03/10/2017  f/u ov/[facility]ert re:  Plaquenil x 01/21/17 @ 200 mg daily  ?Chief Complaint  ?Patient presents with  ? Follow-up  ?  Pt states he occ has a "strange sensation" in the right side of his chest. He states he feels tired most of the time, not much energy.    ?cough x onset 2011 then worse 09/2016 > resolved p prednisone and stayed gone p starting plaquenil as above ?Skin changes x 2016 also improving on plaquenil and steroid cream per derm  ?No need for prednisone since starting plaquenil  ?rec ?No change plaquenil dose for now  ?Please schedule a follow up office visit in 6 weeks, call sooner if needed with cxr on return  ?- consider taper off gerd rx if cough stays gone at next ov ? ? ? ? ? ?02/25/2018  Pulmonary consultation: [facility] re: sarcoidosis on "prn prednisone/ did not maintain on plaquenil / needs clearance for R olecranon bursitis ?Chief Complaint  ?Patient presents with  ? Advice Only  ?  Needing pulm clearance for right elbow surgery. He has occ cough- non prod.   ?  ?Not limited by breathing from desired activities   ?Min daytime dry cough attributes to pnds / some better on dymista when can afford it, does not recall resp to 1st gen H1 blockers per guidelines   ?No ocular complaints  ?Sleeps flat ok  ?rec ?If rash worse > return to dermatology  ?If nasal symptoms worse > ent  ?  See your eye doctor yearly  ? ? ?televist 09/06/18 ?History of Present Illness: ?09/06/2018  f/u ov/Wert re: sarcoidosis / skin involvement/rheumatology  rec plaqueninl 200 x  2 rec prednisone 5 mg  Per Ursula Alert x 20 mg x 4 days  ? Dyspnea:  Not limited by breathing from desired activities  / work out fine ?Cough: none on dymista /pantoprazole ?Sleeping: sleep flat  ?SABA use: none ?02: none  ?rec ?Since your cough is better on dysmista and protonix and does not flare when you have perceive the sarcoid is getting worse it is unlikely related to your sarcoidosis so continue the dysmista/protonix  ?Ok to use protonix Take 30-60 min before first meal of the day but at onset of any cough you need to remember to Take 30- 60 min before your first and last meals of the day until cough is gone again ?I refilled your protonix today for the next 6 months but after that you will need to return here or see your PCP for this (as it's not really a pulmonary issue)  ?Take prednisone and plaquenil at your rheumatologists direction - the goal for plaquenil is to eventually wean you off of all prednisone.  ?Once the carona virus restrictions are listed we need to see you back here for PFT's/ ov so let's set this up for 6 months from now  ? ? ? ?12/08/2018  f/u ov/[facility]ert re:  Sarcoidosis on plaquenil 200 x 2, no pred x months f/u by rheum ?Chief Complaint  ?Patient presents with  ? Follow-up  ?  No co's today  ? Dyspnea:  Good ex tol ?Cough: betterp dymista  ?Sleeping: ok ?SABA use: none  ?02: none  ? ? ?No obvious day to day or daytime variability or assoc excess/ purulent sputum or mucus plugs or hemoptysis or cp or chest tightness, subjective wheeze or overt sinus or hb symptoms.  ? ?Sleeping  without nocturnal  or early am exacerbation  of respiratory  c/o's or need for noct saba. Also denies any obvious fluctuation of symptoms with weather or environmental changes or other aggravating or alleviating factors except as outlined above  ? ?No unusual exposure hx or h/o childhood pna/ asthma or knowledge of premature birth. ? ? ?  ?  ? ?    ?OV 02/21/2020 -transfer of care from Dr. Christinia Gully to Dr.  Chase Caller.  History is provided by the patient and review of the chart. ? ?Subjective:  ?Patient ID: Tajae Rybicki, male , DOB: April 07, 1976 , age 86 y.o. , MRN: [id number] , ADDRESS: [address] ?[place]

## 2021-10-01 NOTE — Progress Notes (Signed)
PFT done today. 

## 2021-10-01 NOTE — Patient Instructions (Addendum)
Sarcoidosis with skin and lung involvement  ? ?- Lung involvement from sarcoid is worse sept 2021 -> sept 2022  ?- PFT  worse March 2022 -> Jan 2023 -> April 2023 ? - progession could be related to related to running out of methotrexate but respect the feel you feel better ? ?Plan ?- continue prednisone 20m daily ?- do full PFT in 3 months ?- ok for incentive spirometer at home exercise ? ? ?Follow-up ?-Return to see Sean Sharp 3 months - 30 min visit but after PFT ? - ILD symptom score and walk test at followup ?    ?

## 2021-11-05 ENCOUNTER — Ambulatory Visit (HOSPITAL_BASED_OUTPATIENT_CLINIC_OR_DEPARTMENT_OTHER): Payer: No Typology Code available for payment source | Admitting: Family Medicine

## 2021-11-05 ENCOUNTER — Encounter (HOSPITAL_BASED_OUTPATIENT_CLINIC_OR_DEPARTMENT_OTHER): Payer: Self-pay

## 2021-11-15 ENCOUNTER — Ambulatory Visit (INDEPENDENT_AMBULATORY_CARE_PROVIDER_SITE_OTHER): Payer: No Typology Code available for payment source | Admitting: Family Medicine

## 2021-11-15 ENCOUNTER — Encounter (HOSPITAL_BASED_OUTPATIENT_CLINIC_OR_DEPARTMENT_OTHER): Payer: Self-pay | Admitting: Family Medicine

## 2021-11-15 VITALS — BP 126/82 | HR 81 | Ht 74.0 in | Wt 291.8 lb

## 2021-11-15 DIAGNOSIS — R1031 Right lower quadrant pain: Secondary | ICD-10-CM | POA: Diagnosis not present

## 2021-11-15 DIAGNOSIS — G4733 Obstructive sleep apnea (adult) (pediatric): Secondary | ICD-10-CM | POA: Diagnosis not present

## 2021-11-15 DIAGNOSIS — J309 Allergic rhinitis, unspecified: Secondary | ICD-10-CM

## 2021-11-15 DIAGNOSIS — Z87438 Personal history of other diseases of male genital organs: Secondary | ICD-10-CM | POA: Diagnosis not present

## 2021-11-15 DIAGNOSIS — Z0000 Encounter for general adult medical examination without abnormal findings: Secondary | ICD-10-CM

## 2021-11-15 DIAGNOSIS — I10 Essential (primary) hypertension: Secondary | ICD-10-CM

## 2021-11-15 MED ORDER — MELOXICAM 7.5 MG PO TABS: 7.5000 mg | ORAL_TABLET | Freq: Every day | ORAL | 0 refills | Status: AC

## 2021-11-15 NOTE — Progress Notes (Signed)
New Patient Office Visit  Subjective    Patient ID: Sean Sharp, male    DOB: 06-28-1975  Age: 46 y.o. MRN: [id number]  CC:  Chief Complaint  Patient presents with   New Patient (Initial Visit)    HPI Sean Sharp presents to establish care Last PCP - Dr. Anitra Lauth, last saw about 1 year ago  HTN: Current medications include amlodipine 10 mg, losartan 50 mg, spironolactone 25 mg, metoprolol 25 mg.  Denies current issues with chest pain or headaches.  Patient also with pulmonary HTN, follows with cardiology and pulmonology  Prediabetes: Last A1c check was about a year ago and found to be 6.1%.  Has been fairly stable over the past few years.  Part of risk for patient is underlying prednisone use.  Has also been working on lifestyle modifications, utilization of metformin in the past  Sarcoidosis: Follows with pulmonology related to pulmonary involvement and [facility].   OSA: had sleep study with very severe OSA, had used machine but cannot tolerate - "it does nothing for me". Was referred to ENT for hypoglossal nerve surgery, was referred back to sleep clinic as well.  Reporting some right inguinal/groin pain. Has been there for about 2 weeks. Does not recall any inciting event. Was seen at [facility], indicates that they were going to refer patient, he is unsure as to where, has not heard anything further from them. Takes Tylenol as needed which does help. Worse with sitting. 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  Outpatient Encounter Medications as of 11/15/2021  Medication Sig   amLODipine (NORVASC) 10 MG tablet Take 1 tablet (10 mg total) by mouth daily.    Azelastine-Fluticasone 137-50 MCG/ACT SUSP Place 2 sprays into both nostrils daily.   clobetasol ointment (TEMOVATE) 0.05 % Apply topically.   Clocortolone Pivalate (CLODERM) 0.1 % cream Apply 1 application topically 2 (two) times daily as needed (Sarcoidosis/rash).    folic acid (FOLVITE) 1 MG tablet Take 1 tablet (1 mg total) by mouth daily.   hydrocortisone butyrate (LUCOID) 0.1 % CREA cream Apply 1 application topically 2 (two) times daily. (Patient taking differently: Apply 1 application  topically 2 (two) times daily. As needed)   hydroxychloroquine (PLAQUENIL) 200 MG tablet Take 400 mg by mouth daily.   hydrOXYzine (ATARAX/VISTARIL) 10 MG tablet TAKE 1 TO 3 TABLETS BY MOUTH EVERY 4 TO 6 HOURS AS NEEDED FOR ITCHING   ipratropium (ATROVENT) 0.03 % nasal spray Place 2 sprays into both nostrils every 12 (twelve) hours.   levocetirizine (XYZAL) 5 MG tablet SMARTSIG:1 Tablet(s) By Mouth Every Evening   losartan (COZAAR) 50 MG tablet TAKE 1 TABLET(50 MG) BY MOUTH DAILY   meloxicam (MOBIC) 7.5 MG tablet Take 1 tablet (7.5 mg total) by mouth daily.   methotrexate (RHEUMATREX) 2.5 MG tablet Take 15 mg by mouth once a week.   metoprolol succinate (TOPROL-XL) 25 MG 24 hr tablet Take 1.5 tablets (37.5 mg total) by mouth daily.   pantoprazole (PROTONIX) 40 MG tablet TAKE 1 TABLET BY MOUTH 30 TO 60 MINUTES BEFORE YOUR FIRST AND LAST MEALS OF THE DAY   predniSONE (DELTASONE) 10 MG tablet Take 10 mg by mouth daily with breakfast.   spironolactone (ALDACTONE) 25 MG tablet Take 1  tablet (25 mg total) by mouth daily.   triamcinolone cream (KENALOG) 0.1 % SMARTSIG:1 Application Topical 2-3 Times Daily   No facility-administered encounter medications on file as of 11/15/2021.    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  Hemoglobin electrophoresis normal 2016.   Chronic renal insufficiency, stage II (mild)    Chronic rhinitis    Cutaneous sarcoidosis    [place] rheum: as of  02/2019->plaquenil.  Cont plaquenil and slowly tapering prednisone as of 06/2019 rheum f/u   Hoarseness 2015   [facility] 04/2014-normal laryngoscopy: prednisone taper and bid nexium recommended.  No signif help so pt got 2nd opinion at [facility]    HTN (hypertension)    Hydrocele    left   LPRD (laryngopharyngeal reflux disease)    Obesity, Class II, BMI 35-39.9    OSA (obstructive sleep apnea) 07/28/2019   SEVERE OSA, mild central sleep apnea, CPAP trial 08/2019 (Dr. Fransico Him)   Peripheral edema    left leg>right leg   Prediabetes 02/2017   A1c 6.3% (right when he was starting to take prednisone prn for treatment of his sarcoidosis).  HbA1c 08/2017=6.3%. A1c 01/2020 6.4%.   Pulmonary arterial hypertension (HCC)    Mild (R heart cath 06/27/19), echo 06/2019 normal.  Wt loss and CPAP recommended.   Sarcoidosis of lung ([place]) 12/2016   2018, but Dr. Melvyn Novas suspects it was smoldering since 2011.  Skin bx of cutaneous lesion confirmed dx of sarcoidosis 02/2017.  Pt improving fall 2018 with plaquenil and prednisone.  Pt self d/c'd plaquenil.   Vocal cord polyps 2015   surgery [facility]     Past Surgical History:  Procedure Laterality Date   OLECRANON BURSECTOMY Right 2019   Polysomnogram  07/28/2019   Severe OSA w/hypoxia, mild central sleep apnea, CPAP recommended (Dr. Fransico Him)   RIGHT HEART CATH N/A 06/27/2019   Mild PAH. Procedure: RIGHT HEART CATH;  Surgeon: Jolaine Artist, MD;  Location: [facility];  Service: Cardiovascular;  Laterality: N/A;   SKIN BIOPSY  2018   Sarcoidosis of skin (also has pulm involvement).   SKIN GRAFT  2010   on left index finger   TRANSTHORACIC ECHOCARDIOGRAM  06/22/2019   NORMAL   Vocal cord surgery  2016   [facility]   WISDOM TOOTH EXTRACTION  2005    Family History  Problem Relation Age of Onset   Sarcoidosis Father        ?   Lactose intolerance Father    Other Mother        colon issues-portion of colon removed   Lactose intolerance Mother     Cancer Paternal Grandfather        ?    Social History   Socioeconomic History   Marital status: Married    Spouse name: Not on file   Number of children: 2   Years of education: Not on file   Highest education level: Not on file  Occupational History   Occupation: electrician  Tobacco Use   Smoking status: Former    Packs/day: 0.30    Years: 1.50    Total pack years: 0.45    Types: Cigarettes    Quit date: 06/10/1995    Years since quitting: 26.5   Smokeless tobacco: Never  Substance and Sexual Activity   Alcohol use: Yes    Comment: occasional   Drug use: No   Sexual activity: Not on file  Other Topics Concern   Not on file  Social History Narrative   Married, 2 children (51 and 54 y/o).   Occupation: Clinical biochemist Editor, commissioning).   Orig from [facility].   No Tobacco.  Rare alcohol.  No drugs.   Enjoys fishing, Environmental health practitioner, wood working, Research officer, trade union.      Social Determinants of Health   Financial Resource Strain: Not on file  Food Insecurity: Not on file  Transportation Needs: Not on file  Physical Activity: Not on file  Stress: Not on file  Social Connections: Not on file  Intimate Partner Violence: Not on file    Objective    BP 126/82   Pulse 81   Ht 6' 2" (1.88 m)   Wt 291 lb 12.8 oz (132.4 kg)   SpO2 98%   BMI 37.46 kg/m   Physical Exam  45 year old male in no acute distress Cardiovascular exam with regular rate and rhythm Lungs clear to auscultation bilaterally  Assessment & Plan:   Problem List Items Addressed This Visit       Cardiovascular and Mediastinum   Essential hypertension    Blood pressure adequate in office today.  Would continue with current regimen Recommend continued close follow-up with cardiology Recommend intermittent monitoring of blood pressure at home, DASH diet        Respiratory   Allergic rhinitis    Can continue with current regimen of nasal sprays, OTC oral antihistamine      OSA  (obstructive sleep apnea)    Discussed concern for underlying sleep apnea particularly if it is not treated and the risks that can be associated with this.  Discussed potential impacts of untreated sleep apnea and difficulty with controlling blood sugar and blood pressures appropriately.  Also with negative effects regarding weight control.         Other   History of hydrocele    As above, will refer to urology given new right inguinal pain      Relevant Orders   Ambulatory referral to Urology   Right inguinal pain - Primary    New issue for patient, but has history of hydrocele in the past for which he had seen a urologist.  Given history and symptoms, will refer to urology for further evaluation and recommendations      Relevant Orders   Ambulatory referral to Urology   Other Visit Diagnoses     Wellness examination       Relevant Orders   CBC with Differential/Platelet   Comprehensive metabolic panel   Hemoglobin A1c   Lipid panel   TSH Rfx on Abnormal to Free T4      Spent 60 minutes on this patient encounter, including preparation, chart review, face-to-face counseling with patient and coordination of care, and documentation of encounter  Return in about 2 months (around 01/15/2022) for CPE with [facility] a few days prior.   Aribella Vavra J [name], MD

## 2021-12-06 DIAGNOSIS — R1031 Right lower quadrant pain: Secondary | ICD-10-CM | POA: Insufficient documentation

## 2021-12-06 NOTE — Assessment & Plan Note (Signed)
Can continue with current regimen of nasal sprays, OTC oral antihistamine

## 2021-12-06 NOTE — Assessment & Plan Note (Signed)
Discussed concern for underlying sleep apnea particularly if it is not treated and the risks that can be associated with this.  Discussed potential impacts of untreated sleep apnea and difficulty with controlling blood sugar and blood pressures appropriately.  Also with negative effects regarding weight control.

## 2021-12-06 NOTE — Assessment & Plan Note (Signed)
Blood pressure adequate in office today.  Would continue with current regimen Recommend continued close follow-up with cardiology Recommend intermittent monitoring of blood pressure at home, DASH diet

## 2021-12-06 NOTE — Assessment & Plan Note (Signed)
New issue for patient, but has history of hydrocele in the past for which he had seen a urologist.  Given history and symptoms, will refer to urology for further evaluation and recommendations

## 2021-12-06 NOTE — Assessment & Plan Note (Signed)
As above, will refer to urology given new right inguinal pain

## 2021-12-26 ENCOUNTER — Other Ambulatory Visit: Payer: Self-pay | Admitting: Cardiology

## 2021-12-30 ENCOUNTER — Telehealth: Payer: Self-pay | Admitting: Internal Medicine

## 2021-12-30 MED ORDER — PREDNISONE 10 MG PO TABS: ORAL_TABLET | ORAL | 0 refills | Status: DC

## 2021-12-30 MED ORDER — DOXYCYCLINE HYCLATE 100 MG PO TABS: 100.0000 mg | ORAL_TABLET | Freq: Two times a day (BID) | ORAL | 0 refills | Status: DC

## 2021-12-30 NOTE — Telephone Encounter (Signed)
Probably acute bronchitis in setting f pulm sarcoid  Plan  - Take doxycycline 194m po twice daily x 5 days; take after meals and avoid sunlight - Please take prednisone 40 mg x1 day, then 30 mg x1 day, then 20 mg x1 day, then 10 mg x1 day, and then 5 mg x1 day and stop     Allergies  Allergen Reactions   Wasp Venom Swelling

## 2021-12-30 NOTE — Telephone Encounter (Signed)
Patient states that for the last week he has had a cough. But he states for the last two day his cough is not productive and his mucus is yellow and sometime almost green color. Patient states he feels like its a resp infection. No other symptoms noted other then the cough with mucus production.

## 2021-12-30 NOTE — Telephone Encounter (Signed)
Spoke with the pt and notified of response per MR  He verbalized understanding  Rx was sent to pharm  Nothing further needed

## 2022-01-08 ENCOUNTER — Ambulatory Visit (HOSPITAL_BASED_OUTPATIENT_CLINIC_OR_DEPARTMENT_OTHER): Payer: No Typology Code available for payment source

## 2022-01-08 DIAGNOSIS — Z0000 Encounter for general adult medical examination without abnormal findings: Secondary | ICD-10-CM

## 2022-01-09 LAB — COMPREHENSIVE METABOLIC PANEL
ALT: 18 IU/L (ref 0–44)
AST: 14 IU/L (ref 0–40)
Albumin/Globulin Ratio: 1.2 (ref 1.2–2.2)
Albumin: 4 g/dL — ABNORMAL LOW (ref 4.1–5.1)
Alkaline Phosphatase: 58 IU/L (ref 44–121)
BUN/Creatinine Ratio: 15 (ref 9–20)
BUN: 18 mg/dL (ref 6–24)
Bilirubin Total: 0.3 mg/dL (ref 0.0–1.2)
CO2: 23 mmol/L (ref 20–29)
Calcium: 9.5 mg/dL (ref 8.7–10.2)
Chloride: 102 mmol/L (ref 96–106)
Creatinine, Ser: 1.18 mg/dL (ref 0.76–1.27)
Globulin, Total: 3.3 g/dL (ref 1.5–4.5)
Glucose: 104 mg/dL — ABNORMAL HIGH (ref 70–99)
Potassium: 4.4 mmol/L (ref 3.5–5.2)
Sodium: 139 mmol/L (ref 134–144)
Total Protein: 7.3 g/dL (ref 6.0–8.5)
eGFR: 77 mL/min/{1.73_m2} (ref >59)

## 2022-01-09 LAB — LIPID PANEL
Chol/HDL Ratio: 3.7 ratio (ref 0.0–5.0)
Cholesterol, Total: 163 mg/dL (ref 100–199)
HDL: 44 mg/dL (ref >39)
LDL Chol Calc (NIH): 108 mg/dL — ABNORMAL HIGH (ref 0–99)
Triglycerides: 53 mg/dL (ref 0–149)
VLDL Cholesterol Cal: 11 mg/dL (ref 5–40)

## 2022-01-09 LAB — TSH RFX ON ABNORMAL TO FREE T4: TSH: 2.33 u[IU]/mL (ref 0.450–4.500)

## 2022-01-09 LAB — CBC WITH DIFFERENTIAL/PLATELET
Basophils Absolute: 0.1 10*3/uL (ref 0.0–0.2)
Basos: 1 %
EOS (ABSOLUTE): 0.2 10*3/uL (ref 0.0–0.4)
Eos: 2 %
Hematocrit: 45.4 % (ref 37.5–51.0)
Hemoglobin: 13.4 g/dL (ref 13.0–17.7)
Immature Grans (Abs): 0.5 10*3/uL — ABNORMAL HIGH (ref 0.0–0.1)
Immature Granulocytes: 5 %
Lymphocytes Absolute: 0.5 10*3/uL — ABNORMAL LOW (ref 0.7–3.1)
Lymphs: 6 %
MCH: 20.6 pg — ABNORMAL LOW (ref 26.6–33.0)
MCHC: 29.5 g/dL — ABNORMAL LOW (ref 31.5–35.7)
MCV: 70 fL — ABNORMAL LOW (ref 79–97)
Monocytes Absolute: 1 10*3/uL — ABNORMAL HIGH (ref 0.1–0.9)
Monocytes: 10 %
Neutrophils Absolute: 7.4 10*3/uL — ABNORMAL HIGH (ref 1.4–7.0)
Neutrophils: 76 %
Platelets: 292 10*3/uL (ref 150–450)
RBC: 6.51 x10E6/uL — ABNORMAL HIGH (ref 4.14–5.80)
RDW: 18.8 % — ABNORMAL HIGH (ref 11.6–15.4)
WBC: 9.6 10*3/uL (ref 3.4–10.8)

## 2022-01-09 LAB — HEMOGLOBIN A1C
Est. average glucose Bld gHb Est-mCnc: 128 mg/dL
Hgb A1c MFr Bld: 6.1 % — ABNORMAL HIGH (ref 4.8–5.6)

## 2022-01-15 ENCOUNTER — Encounter (HOSPITAL_BASED_OUTPATIENT_CLINIC_OR_DEPARTMENT_OTHER): Payer: No Typology Code available for payment source | Admitting: Family Medicine

## 2022-01-20 ENCOUNTER — Encounter (HOSPITAL_BASED_OUTPATIENT_CLINIC_OR_DEPARTMENT_OTHER): Payer: Self-pay | Admitting: Family Medicine

## 2022-02-05 ENCOUNTER — Other Ambulatory Visit: Payer: Self-pay | Admitting: *Deleted

## 2022-02-05 MED ORDER — LOSARTAN POTASSIUM 50 MG PO TABS: ORAL_TABLET | ORAL | 3 refills | Status: DC

## 2022-02-11 ENCOUNTER — Telehealth: Payer: Self-pay | Admitting: Internal Medicine

## 2022-02-11 NOTE — Telephone Encounter (Signed)
Called and spoke with patient.  Patient stated he was treated 12/2021 for bronchitis and feels he has never gotten over it.  Patient stated he has had multiple rounds of antibiotics and continues productive cough with  headaches that are worse in the morning. Patient stated his sputum has continued to have color that can be light to dark brown. Patient stated he has seen a small red speck in his sputum.  Patient is scheduled 02/12/22 with PCP.  Patient could not been seen in office until Friday or Monday.  Patient is scheduled with Joellen Jersey, NP 02/17/22 at 3:30pm.

## 2022-02-12 ENCOUNTER — Encounter (HOSPITAL_BASED_OUTPATIENT_CLINIC_OR_DEPARTMENT_OTHER): Payer: Self-pay | Admitting: Family Medicine

## 2022-02-12 ENCOUNTER — Ambulatory Visit (INDEPENDENT_AMBULATORY_CARE_PROVIDER_SITE_OTHER): Payer: Self-pay | Admitting: Family Medicine

## 2022-02-12 VITALS — BP 158/85 | HR 90 | Temp 97.6°F | Ht 74.0 in | Wt 288.9 lb

## 2022-02-12 DIAGNOSIS — Z23 Encounter for immunization: Secondary | ICD-10-CM

## 2022-02-12 DIAGNOSIS — Z0000 Encounter for general adult medical examination without abnormal findings: Secondary | ICD-10-CM | POA: Insufficient documentation

## 2022-02-12 DIAGNOSIS — Z1211 Encounter for screening for malignant neoplasm of colon: Secondary | ICD-10-CM

## 2022-02-12 MED ORDER — AMLODIPINE BESYLATE 10 MG PO TABS: 10.0000 mg | ORAL_TABLET | Freq: Every day | ORAL | 1 refills | Status: DC

## 2022-02-12 MED ORDER — METOPROLOL SUCCINATE ER 25 MG PO TB24: 37.5000 mg | ORAL_TABLET | Freq: Every day | ORAL | 3 refills | Status: DC

## 2022-02-12 NOTE — Progress Notes (Signed)
Subjective:    CC: Annual Physical Exam  HPI:  Sean Sharp is a 46 y.o. presenting for annual physical  I reviewed the past medical history, family history, social history, surgical history, and allergies today and no changes were needed.  Please see the problem list section below in epic for further details.  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  Hemoglobin electrophoresis normal 2016.   Chronic renal insufficiency, stage II (mild)    Chronic rhinitis    Cutaneous sarcoidosis    [place] rheum: as of 02/2019->plaquenil.  Cont plaquenil and slowly tapering prednisone as of 06/2019 rheum f/u   Hoarseness 2015   [facility] 04/2014-normal laryngoscopy: prednisone taper and bid nexium recommended.  No signif help so pt got 2nd opinion at [facility]    HTN (hypertension)    Hydrocele    left   LPRD (laryngopharyngeal reflux disease)    Obesity, Class II, BMI 35-39.9    OSA (obstructive sleep apnea) 07/28/2019   SEVERE OSA, mild central sleep apnea, CPAP trial 08/2019 (Dr. Fransico Him)   Peripheral edema    left leg>right leg   Prediabetes 02/2017   A1c 6.3% (right when he was starting to take prednisone prn for treatment of his sarcoidosis).  HbA1c 08/2017=6.3%. A1c 01/2020 6.4%.   Pulmonary arterial hypertension (HCC)    Mild (R heart cath 06/27/19), echo 06/2019 normal.  Wt loss and CPAP recommended.   Sarcoidosis of lung ([place]) 12/2016   2018, but Dr. Melvyn Novas suspects it was smoldering since 2011.  Skin bx of cutaneous lesion confirmed dx of sarcoidosis 02/2017.  Pt improving fall 2018 with plaquenil and prednisone.  Pt self d/c'd plaquenil.   Vocal cord polyps 2015   surgery [facility]    Past Surgical History: Past Surgical History:  Procedure Laterality Date   OLECRANON BURSECTOMY Right 2019   Polysomnogram  07/28/2019   Severe OSA w/hypoxia, mild central sleep apnea, CPAP recommended (Dr. Fransico Him)   RIGHT HEART CATH N/A  06/27/2019   Mild PAH. Procedure: RIGHT HEART CATH;  Surgeon: Jolaine Artist, MD;  Location: [facility];  Service: Cardiovascular;  Laterality: N/A;   SKIN BIOPSY  2018   Sarcoidosis of skin (also has pulm involvement).   SKIN GRAFT  2010   on left index finger   TRANSTHORACIC ECHOCARDIOGRAM  06/22/2019   NORMAL   Vocal cord surgery  2016   [facility]   WISDOM TOOTH EXTRACTION  2005   Social History: Social History   Socioeconomic History   Marital status: Married    Spouse name: Not on file   Number of children: 2   Years of education: Not on file   Highest education level: Not on file  Occupational History   Occupation: electrician  Tobacco Use   Smoking status: Former    Packs/day: 0.30    Years: 1.50    Total pack years: 0.45    Types: Cigarettes    Quit date: 06/10/1995    Years since quitting: 26.6   Smokeless tobacco: Never  Substance and Sexual Activity   Alcohol use: Yes    Comment: occasional   Drug use: No   Sexual activity: Not on file  Other Topics Concern   Not on file  Social History Narrative   Married, 2 children (38 and 81 y/o).   Occupation: Clinical biochemist Editor, commissioning).   Orig from [facility].   No  Tobacco.  Rare alcohol.  No drugs.   Enjoys fishing, Environmental health practitioner, wood working, Research officer, trade union.      Social Determinants of Health   Financial Resource Strain: Not on file  Food Insecurity: Not on file  Transportation Needs: Not on file  Physical Activity: Not on file  Stress: Not on file  Social Connections: Not on file   Family History: Family History  Problem Relation Age of Onset   Sarcoidosis Father        ?   Lactose intolerance Father    Other Mother        colon issues-portion of colon removed   Lactose intolerance Mother    Cancer Paternal Grandfather        ?   Allergies: Allergies  Allergen Reactions   Wasp Venom Swelling   Medications: See med rec.  Review of Systems: No headache, visual changes,  nausea, vomiting, diarrhea, constipation, dizziness, abdominal pain, skin rash, fevers, chills, night sweats, swollen lymph nodes, weight loss, chest pain, body aches, joint swelling, muscle aches, shortness of breath, mood changes, visual or auditory hallucinations.  Objective:    BP (!) 158/85   Pulse 90   Temp 97.6 F (36.4 C)   Ht _0  (1.88 m)   Wt 288 lb 14.4 oz (131 kg)   SpO2 97%   BMI 37.09 kg/m   General: Well Developed, well nourished, and in no acute distress. Neuro: Alert and oriented x3, extra-ocular muscles intact, sensation grossly intact. Cranial nerves II through XII are intact, motor, sensory, and coordinative functions are all intact. HEENT: Normocephalic, atraumatic, pupils equal round reactive to light, neck supple, no masses, no lymphadenopathy, thyroid nonpalpable. Oropharynx, nasopharynx, external ear canals are unremarkable. Skin: Warm and dry, no rashes noted. Lesion located over distal left lower extremity, lateral aspect. No erythema, no active drainage. Cardiac: Regular rate and rhythm, no murmurs rubs or gallops.  Respiratory: Clear to auscultation bilaterally. Not using accessory muscles, speaking in full sentences. Abdominal: Soft, nontender, nondistended, positive bowel sounds, no masses, no organomegaly. Musculoskeletal: Shoulder, elbow, wrist, hip, knee, ankle stable, and with full range of motion.  Impression and Recommendations:    Wellness examination Routine HCM labs reviewed. HCM reviewed/discussed. Anticipatory guidance regarding healthy weight, lifestyle and choices given. Recommend healthy diet.  Recommend approximately 150 minutes/week of moderate intensity exercise Recommend regular dental and vision exams Always use seatbelt/lap and shoulder restraints Recommend using smoke alarms and checking batteries at least twice a year Recommend using sunscreen when outside Discussed colon cancer screening recommendations, options.  Patient would  like referral for colonoscopy Discussed tetanus immunization recommendations, patient is UTD Flu shot administered today  Return in about 4 months (around 06/14/2022) for HTN.  He is due for follow-up with Cardiology - recommend that he contact their office to schedule this. He reports that he did have sleep study completed and was diagnosed with sleep apnea, however reports that since using CPAP, has not had any significant change in symptoms due to this he questions whether he really has sleep apnea.  During office visit today, he did fall asleep few times.  Recommend that he discuss further with his sleep medicine specialist regarding continued symptoms and possible consideration for change in CPAP settings or needing alternative mask For lesion over lower left leg, recommend scheduling evaluation with dermatologist for further evaluation   ___________________________________________ Mckell Riecke [name], MD, ABFM, CAQSM [facility] [place]

## 2022-02-12 NOTE — Patient Instructions (Signed)
  Medication Instructions:  Your physician recommends that you continue on your current medications as directed. Please refer to the Current Medication list given to you today. --If you need a refill on any your medications before your next appointment, please call your pharmacy first. If no refills are authorized on file call the office.-- Lab Work: Your physician has recommended that you have lab work today: No If you have labs (blood work) drawn today and your tests are completely normal, you will receive your results via [place] a phone call from our staff.  Please ensure you check your voicemail in the event that you authorized detailed messages to be left on a delegated number. If you have any lab test that is abnormal or we need to change your treatment, we will call you to review the results.  Referrals/Procedures/Imaging: No  Follow-Up: Your next appointment:   Your physician recommends that you schedule a follow-up appointment in: 4-6 months with Dr. [name].  You will receive a text message or e-mail with a link to a survey about your care and experience with [place] today! We would greatly appreciate your feedback!   Thanks for letting us be apart of your health journey!!  Primary Care and Sports Medicine   Dr. Arlina Robes [place]   We encourage you to activate your patient portal called "MyChart".  Sign up information is provided on this After Visit Summary.  MyChart is used to connect with patients for Virtual Visits (Telemedicine).  Patients are able to view lab/test results, encounter notes, upcoming appointments, etc.  Non-urgent messages can be sent to your provider as well. To learn more about what you can do with MyChart, please visit --  [email or web address].

## 2022-02-12 NOTE — Assessment & Plan Note (Addendum)
Routine HCM labs reviewed. HCM reviewed/discussed. Anticipatory guidance regarding healthy weight, lifestyle and choices given. Recommend healthy diet.  Recommend approximately 150 minutes/week of moderate intensity exercise Recommend regular dental and vision exams Always use seatbelt/lap and shoulder restraints Recommend using smoke alarms and checking batteries at least twice a year Recommend using sunscreen when outside Discussed colon cancer screening recommendations, options.  Patient would like referral for colonoscopy Discussed tetanus immunization recommendations, patient is UTD

## 2022-02-17 ENCOUNTER — Encounter: Payer: Self-pay | Admitting: Nurse Practitioner

## 2022-02-17 ENCOUNTER — Ambulatory Visit (INDEPENDENT_AMBULATORY_CARE_PROVIDER_SITE_OTHER): Payer: No Typology Code available for payment source | Admitting: Nurse Practitioner

## 2022-02-17 ENCOUNTER — Ambulatory Visit (INDEPENDENT_AMBULATORY_CARE_PROVIDER_SITE_OTHER): Payer: No Typology Code available for payment source

## 2022-02-17 VITALS — BP 142/90 | HR 93 | Temp 99.0°F | Ht 74.0 in | Wt 291.4 lb

## 2022-02-17 DIAGNOSIS — D869 Sarcoidosis, unspecified: Secondary | ICD-10-CM

## 2022-02-17 DIAGNOSIS — J189 Pneumonia, unspecified organism: Secondary | ICD-10-CM

## 2022-02-17 DIAGNOSIS — J209 Acute bronchitis, unspecified: Secondary | ICD-10-CM

## 2022-02-17 MED ORDER — ALBUTEROL SULFATE HFA 108 (90 BASE) MCG/ACT IN AERS: 2.0000 | INHALATION_SPRAY | Freq: Four times a day (QID) | RESPIRATORY_TRACT | 2 refills | Status: DC | PRN

## 2022-02-17 MED ORDER — PREDNISONE 5 MG PO TABS: 5.0000 mg | ORAL_TABLET | Freq: Every day | ORAL | 0 refills | Status: DC

## 2022-02-17 MED ORDER — PREDNISONE 10 MG PO TABS: ORAL_TABLET | ORAL | 0 refills | Status: DC

## 2022-02-17 MED ORDER — ALBUTEROL SULFATE (2.5 MG/3ML) 0.083% IN NEBU: 2.5000 mg | INHALATION_SOLUTION | Freq: Four times a day (QID) | RESPIRATORY_TRACT | 12 refills | Status: DC | PRN

## 2022-02-17 MED ORDER — AMOXICILLIN-POT CLAVULANATE 875-125 MG PO TABS: 1.0000 | ORAL_TABLET | Freq: Two times a day (BID) | ORAL | 0 refills | Status: AC

## 2022-02-17 NOTE — Assessment & Plan Note (Signed)
Difficult to assess on imaging given his severe pulmonary sarcoidosis.  Given his clinical symptoms, not able to entirely rule out pneumonia/superimposed infection.  We will treat him for possible infection with empiric Augmentin course x7 days.  Sputum culture ordered for further evaluation given his history and waxing and waning symptoms over the last few months.  Provided with ED precautions and close follow-up.  Patient Instructions  Albuterol inhaler 2 puffs or 3 mL neb every 6 hours as needed for shortness of breath or wheezing. Notify if symptoms persist despite rescue inhaler/neb use.  -Prednisone taper. 4 tabs for 3 days, then 3 tabs for 3 days, 2 tabs for 3 days, then 1 tab for 3 days, then resume 5 mg daily. Take in AM with food -Augmentin 1 tab Twice daily for 7 days. Take with food. Eat yogurt daily or take an over the counter probiotic while taking -Mucinex [phone] mg Twice daily for chest congestion/cough  Contact [name] office for follow up  Talk to your dermatologist about the skin lesion. If you develop worsening redness, warmth, pus, or new fevers, go to the ED.  Sputum culture - collect and return as directed   Follow up in 2 weeks with Dr. Chase Caller or Alanson Aly. If symptoms do not improve or worsen, please contact office for sooner follow up or seek emergency care.

## 2022-02-17 NOTE — Patient Instructions (Addendum)
Albuterol inhaler 2 puffs or 3 mL neb every 6 hours as needed for shortness of breath or wheezing. Notify if symptoms persist despite rescue inhaler/neb use.  -Prednisone taper. 4 tabs for 3 days, then 3 tabs for 3 days, 2 tabs for 3 days, then 1 tab for 3 days, then resume 5 mg daily. Take in AM with food -Augmentin 1 tab Twice daily for 7 days. Take with food. Eat yogurt daily or take an over the counter probiotic while taking -Mucinex [phone] mg Twice daily for chest congestion/cough  Contact [name] office for follow up  Talk to your dermatologist about the skin lesion. If you develop worsening redness, warmth, pus, or new fevers, go to the ED.  Sputum culture - collect and return as directed   Follow up in 2 weeks with Dr. Chase Caller or Alanson Aly. If symptoms do not improve or worsen, please contact office for sooner follow up or seek emergency care.

## 2022-02-17 NOTE — Progress Notes (Signed)
_0  ID: Sean Sharp, male    DOB: [birth year], 46 y.o.   MRN: [id number]  Chief Complaint  Patient presents with   Follow-up    Referring provider: [name], [name], MD  HPI: 46 year old male, former smoker followed for pulmonary sarcoidosis.  He is a patient of [name] and last seen in office 10/01/2021.  Past medical history significant for hypertension, venous insufficiency, allergic rhinitis, OSA not on CPAP, GERD.  TEST/EVENTS:  02/22/2021 HRCT chest: Enlarged, coarsely calcified lymph nodes throughout the mediastinum and hila.  Redemonstrated very extensive fine nodularity throughout the lungs, many of which are confluent in consolidations and concentrated along the pleural surfaces and fissures and with a an upper lobe predominance.  There is extensive architectural distortion.  Unchanged, thin-walled pneumatocele of the right apex.  No significant change in a septated, cavitary, masslike consolidation in the perihilar right lung.  Significantly increased subpleural consolidation of the peripheral left lower lobe, measuring 2.9 x 1.6 cm.  Increased consolidation posteriorly in the left upper lobe abutting the fissure, largest cross-sectional area measuring 3.5 x 2.2 cm.  Increased consolidation peripherally in the superior segment right lower lobe, greatest measuring 3.4 x 2.2 cm.  Findings are consistent with worsening pulmonary sarcoidosis with features of massive fibrosis 06/13/2021 echocardiogram: EF 65 to 70%.  G1 DD.  RV size and function is normal. 07/05/2021 PFT: FVC 60, FEV1 53, ratio 72, TLC 59, DLCOcor 68 10/01/2021 PFT: FVC 56, FEV1 44, ratio 71, TLC 57, DLCOcor 58  10/01/2021: Virtual visit with Dr. Chase Caller for follow-up after PFT.  He did not have a history of sarcoidosis in 2011 but was present in 2018 and October 2020.  At the time seemed inflammatory but now with fibrotic changes.  He follows with [name] with rheumatology and is currently on chronic  prednisone 5 mg daily.  He was previously on methotrexate and Plaquenil but stopped these a few months ago.  Did recently increase his prednisone (directed); feels like he had a flare with generalized weakness though his lungs were okay.  Skin doing okay.  FVC declined 6%, DLCO down 70%.  He feels that his decline in PFTs is due to technique issues because he actually feels better from a respiratory standpoint.  He is able to climb hills and walk better than previously.  He is unsure if methotrexate actually made a difference.  Not currently inclined to restart.  Insurance also an issue.  Discussed that the progression of his disease could be related to running out of methotrexate.  Recommended full PFT in 3 months.  Continue IS at home.  02/17/2022: Today - acute Patient presents today for acute visit.  He was sick in July; contacted the office with increased productive cough and was treated with prednisone taper and doxycycline.  He did initially get better but has never quite felt back to normal since then.  Then around a week ago, he contacted the office on 9/5 reporting increased productive cough with brown sputum.  Today, he reports feeling relatively unchanged if not worse.  He is still coughing up purulent sputum and having some pleuritic discomfort.  Feels like he has a lot of chest discomfort with deep breaths.  He did at one point think that he had some blood-tinged sputum with a red speck in his mucus last week but has not had any further episodes of this.  Breathing is worse than it usually is.  He gets more winded with walking from the parking  lot into the office.  Has not noticed any significant wheezing.  He has felt febrile at times; has not checked his temperature at home but thinks that he has had low-grade fever.  He was 27 F today.  He also has a lesion on his left calf.  He spoke to his primary care doctor about this who recommended that he go to his dermatologist.  He has had this for many  months now.  Does not seem to get worse or better with antibiotics.  No drainage that he has noticed.  It does not occasionally get swollen and warm to the touch.  It is also tender when he touches it which is not consistent with his normal sarcoid lesions.  Denies any mass hemoptysis, night sweats, orthopnea, leg swelling, calf pain, vision changes.  No recent sick exposures.  He is on daily prednisone 5 mg but still he ran out of his prescription so has not taken any today.  Stopped methotrexate and Plaquenil over a year ago due to insurance issues.  Has not seen [name] in some time.   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  Hemoglobin electrophoresis normal 2016.   Chronic renal insufficiency, stage II (mild)    Chronic rhinitis    Cutaneous sarcoidosis    [place] rheum: as of 02/2019->plaquenil.  Cont plaquenil and slowly tapering prednisone as of 06/2019 rheum f/u   Hoarseness 2015   [facility] 04/2014-normal laryngoscopy: prednisone taper and bid nexium recommended.  No signif help so pt got 2nd opinion at [facility]    HTN (hypertension)    Hydrocele    left   LPRD (laryngopharyngeal reflux disease)    Obesity, Class II, BMI 35-39.9    OSA (obstructive sleep apnea) 07/28/2019   SEVERE OSA, mild central sleep apnea, CPAP trial 08/2019 (Dr. Fransico Him)   Peripheral edema    left leg>right leg   Prediabetes 02/2017   A1c 6.3% (right when he was starting to take prednisone prn for treatment of his sarcoidosis).  HbA1c 08/2017=6.3%. A1c 01/2020 6.4%.   Pulmonary arterial hypertension (HCC)    Mild (R heart  cath 06/27/19), echo 06/2019 normal.  Wt loss and CPAP recommended.   Sarcoidosis of lung ([place]) 12/2016   2018, but Dr. Melvyn Novas suspects it was smoldering since 2011.  Skin bx of cutaneous lesion confirmed dx of sarcoidosis 02/2017.  Pt improving fall 2018 with plaquenil and prednisone.  Pt self d/c'd plaquenil.   Vocal cord polyps 2015   surgery [facility]     Tobacco History: Social History   Tobacco Use  Smoking Status Former   Packs/day: 0.30   Years: 1.50   Total pack years: 0.45   Types: Cigarettes   Quit date: 06/10/1995   Years since quitting: 26.7  Smokeless Tobacco Never   Counseling given: Not Answered   Outpatient Medications Prior to Visit  Medication Sig Dispense Refill   amLODipine (NORVASC) 10 MG tablet Take 1 tablet (10 mg total) by mouth daily. 90 tablet 1   Azelastine-Fluticasone 137-50 MCG/ACT SUSP Place 2 sprays into both nostrils daily.     clobetasol ointment (TEMOVATE) 0.05 % Apply topically.  Clocortolone Pivalate (CLODERM) 0.1 % cream Apply 1 application topically 2 (two) times daily as needed (Sarcoidosis/rash).      folic acid (FOLVITE) 1 MG tablet Take 1 tablet (1 mg total) by mouth daily. 30 tablet 5   hydrocortisone butyrate (LUCOID) 0.1 % CREA cream Apply 1 application topically 2 (two) times daily. (Patient taking differently: Apply 1 application  topically 2 (two) times daily. As needed) 60 g 6   hydrOXYzine (ATARAX/VISTARIL) 10 MG tablet TAKE 1 TO 3 TABLETS BY MOUTH EVERY 4 TO 6 HOURS AS NEEDED FOR ITCHING     ipratropium (ATROVENT) 0.03 % nasal spray Place 2 sprays into both nostrils every 12 (twelve) hours. 30 mL 12   levocetirizine (XYZAL) 5 MG tablet SMARTSIG:1 Tablet(s) By Mouth Every Evening     losartan (COZAAR) 50 MG tablet TAKE 1 TABLET(50 MG) BY MOUTH DAILY 90 tablet 3   meloxicam (MOBIC) 7.5 MG tablet Take 1 tablet (7.5 mg total) by mouth daily. 30 tablet 0   metoprolol succinate (TOPROL-XL) 25 MG 24 hr tablet Take 1.5 tablets (37.5 mg total) by  mouth daily. 45 tablet 3   pantoprazole (PROTONIX) 40 MG tablet TAKE 1 TABLET BY MOUTH 30 TO 60 MINUTES BEFORE YOUR FIRST AND LAST MEALS OF THE DAY 180 tablet 3   spironolactone (ALDACTONE) 25 MG tablet Take 1 tablet (25 mg total) by mouth daily. 30 tablet 0   triamcinolone cream (KENALOG) 0.1 % SMARTSIG:1 Application Topical 2-3 Times Daily     doxycycline (VIBRA-TABS) 100 MG tablet Take 1 tablet (100 mg total) by mouth 2 (two) times daily. 10 tablet 0   hydroxychloroquine (PLAQUENIL) 200 MG tablet Take 400 mg by mouth daily.     methotrexate (RHEUMATREX) 2.5 MG tablet Take 15 mg by mouth once a week.     predniSONE (DELTASONE) 10 MG tablet Take 10 mg by mouth daily with breakfast.     predniSONE (DELTASONE) 10 MG tablet 4 x 1 day, 3 x 1 day, 2 x 1 day, 1 x 1 day and then 1/2 x 1 day and stop (Patient not taking: Reported on 02/17/2022) 11 tablet 0   No facility-administered medications prior to visit.     Review of Systems:   Constitutional: No weight loss or gain, night sweats, fevers, or lassitude. +fatigue, chills HEENT: No headaches, difficulty swallowing, tooth/dental problems, or sore throat. No sneezing, itching, ear ache, nasal congestion, or post nasal drip CV:  No chest pain, orthopnea, PND, swelling in lower extremities, anasarca, dizziness, palpitations, syncope Resp: +shortness of breath with exertion; productive cough; chest tightness.  No hemoptysis. No wheezing.  No chest wall deformity GI:  No heartburn, indigestion, abdominal pain, nausea, vomiting, diarrhea, change in bowel habits, loss of appetite, bloody stools.  Skin: No rash, ulcerations. +left calf lesion Neuro: No dizziness or lightheadedness.  Psych: No depression or anxiety. Mood stable.     Physical Exam:  BP (!) 142/90 (BP Location: Right Arm, Cuff Size: Large)   Pulse 93   Temp 99 F (37.2 C)   Ht _0  (1.88 m)   Wt 291 lb 6.4 oz (132.2 kg)   SpO2 96%   BMI 37.41 kg/m   GEN: Pleasant,  interactive, acutely-ill appearing; obese; in no acute distress. HEENT:  Normocephalic and atraumatic. PERRLA. Sclera white. Nasal turbinates pink, moist and patent bilaterally. No rhinorrhea present. Oropharynx pink and moist, without exudate or edema. No lesions, ulcerations, or postnasal drip.  NECK:  Supple w/ fair ROM. No JVD  present. Normal carotid impulses w/o bruits. Thyroid symmetrical with no goiter or nodules palpated. No lymphadenopathy.   CV: RRR, no m/r/g, no peripheral edema. Pulses intact, +2 bilaterally. No cyanosis, pallor or clubbing. PULMONARY:  Unlabored, regular breathing. Scattered rhonchi bilaterally A&P. No accessory muscle use. No dullness to percussion. GI: BS present and normoactive. Soft, non-tender to palpation. No organomegaly or masses detected.  MSK: No erythema, warmth or tenderness. No deformities or joint swelling noted.  Neuro: A/Ox3. No focal deficits noted.   Skin: Warm, no rashes. Approximated lesion left lateral calf; no exudate, erythema or warmth  Psych: Normal affect and behavior. Judgement and thought content appropriate.     Lab Results:  CBC    Component Value Date/Time   WBC 9.6 01/08/2022 0901   WBC 8.0 04/02/2021 1654   RBC 6.51 (H) 01/08/2022 0901   RBC 6.07 (H) 04/02/2021 1654   HGB 13.4 01/08/2022 0901   HCT 45.4 01/08/2022 0901   PLT 292 01/08/2022 0901   MCV 70 (L) 01/08/2022 0901   MCH 20.6 (L) 01/08/2022 0901   MCH 21.4 (L) 07/21/2019 0406   MCHC 29.5 (L) 01/08/2022 0901   MCHC 31.5 04/02/2021 1654   RDW 18.8 (H) 01/08/2022 0901   LYMPHSABS 0.5 (L) 01/08/2022 0901   MONOABS 0.9 04/02/2021 1654   EOSABS 0.2 01/08/2022 0901   BASOSABS 0.1 01/08/2022 0901    BMET    Component Value Date/Time   NA 139 01/08/2022 0901   K 4.4 01/08/2022 0901   CL 102 01/08/2022 0901   CO2 23 01/08/2022 0901   GLUCOSE 104 (H) 01/08/2022 0901   GLUCOSE 94 04/02/2021 1654   BUN 18 01/08/2022 0901   CREATININE 1.18 01/08/2022 0901    CREATININE 1.43 (H) 02/28/2016 0720   CALCIUM 9.5 01/08/2022 0901   GFRNONAA 79 10/14/2019 0000   GFRAA 91 10/14/2019 0000    BNP No results found for: "BNP"   Imaging:  DG Chest 2 View  Result Date: 02/17/2022 CLINICAL DATA:  History of sarcoidosis, productive cough and low-grade fever EXAM: CHEST - 2 VIEW COMPARISON:  July 20, 2019, February 22, 2021 FINDINGS: Stable cardiomediastinal contours. Diffuse heterogeneous and nodular pulmonary opacities, including a cavitary lesion in the right upper lobe. The extent of opacification appears similar to chest CT from September 2022 allowing for differences in imaging modality. Chronic pleural thickening.  No large pneumothorax. No acute osseous abnormality. The visualized upper abdomen is unremarkable. IMPRESSION: Redemonstrated findings consistent with severe pulmonary sarcoidosis including diffuse chronic heterogeneous and nodular pulmonary opacities and cavitation in the right upper lobe. Overall, the extent appears similar to chest CT from September 2022 allowing for differences in imaging modality. However, in the clinical setting of productive cough and low-grade fever, a superimposed pneumonia is also possible. Electronically Signed   By: Beryle Flock M.D.   On: 02/17/2022 16:10         Latest Ref Rng & Units 10/01/2021    3:02 PM 07/05/2021    3:00 PM 09/06/2020    2:52 PM 07/24/2020    1:01 PM 04/05/2020    3:44 PM  PFT Results  FVC-Pre L 2.77  2.96  3.04  3.04  3.30   FVC-Predicted Pre % 56  60  61  61  66   FVC-Post L 2.91  3.06  3.29  3.29  3.38   FVC-Predicted Post % 59  62  66  66  68   Pre FEV1/FVC % % 63  71  70  70  75   Post FEV1/FCV % % 71  72  75  75  77   FEV1-Pre L 1.75  2.11  2.13  2.13  2.48   FEV1-Predicted Pre % 44  53  53  53  62   FEV1-Post L 2.08  2.19  2.48  2.48  2.60   DLCO uncorrected ml/min/mmHg 19.66  23.39  24.55  24.55  25.13   DLCO UNC% % 58  68  72  72  73   DLCO corrected ml/min/mmHg 19.66   23.39  24.55  24.55  25.13   DLCO COR %Predicted % 58  68  72  72  73   DLVA Predicted % 120  130  140  140  121   TLC L 4.47  4.62  4.72  4.72  4.87   TLC % Predicted % 57  59  60  60  62   RV % Predicted % 75  76  73  73  45     Lab Results  Component Value Date   NITRICOXIDE 18 12/11/2016        Assessment & Plan:   CAP (community acquired pneumonia) Difficult to assess on imaging given his severe pulmonary sarcoidosis.  Given his clinical symptoms, not able to entirely rule out pneumonia/superimposed infection.  We will treat him for possible infection with empiric Augmentin course x7 days.  Sputum culture ordered for further evaluation given his history and waxing and waning symptoms over the last few months.  Provided with ED precautions and close follow-up.  Patient Instructions  Albuterol inhaler 2 puffs or 3 mL neb every 6 hours as needed for shortness of breath or wheezing. Notify if symptoms persist despite rescue inhaler/neb use.  -Prednisone taper. 4 tabs for 3 days, then 3 tabs for 3 days, 2 tabs for 3 days, then 1 tab for 3 days, then resume 5 mg daily. Take in AM with food -Augmentin 1 tab Twice daily for 7 days. Take with food. Eat yogurt daily or take an over the counter probiotic while taking -Mucinex [phone] mg Twice daily for chest congestion/cough  Contact [name] office for follow up  Talk to your dermatologist about the skin lesion. If you develop worsening redness, warmth, pus, or new fevers, go to the ED.  Sputum culture - collect and return as directed   Follow up in 2 weeks with Dr. Chase Caller or Alanson Aly. If symptoms do not improve or worsen, please contact office for sooner follow up or seek emergency care.     Sarcoidosis with skin and lung involvement  Very severe disease with significant fibrosis of the lungs.  He had a decline in his pulmonary function testing from January 2023 to April 2023.  He felt this was due to technique  issues.  Up until July, he was feeling stable from a respiratory standpoint.  Since then, he has been struggling with cough and increased shortness of breath.  Treated with antibiotics and steroids.  Does have some improvement initially and then flares back up again.  Could have potential superimposed infection versus worsening of disease today.  See above plan.  We did discuss that it seems that there is a correlation between him stopping methotrexate and Plaquenil and the worsening of his symptoms over the last year.  He is going to get back into see [name] and is currently working on insurance options.  He does have a skin lesion on his left  lower leg.  He does not think this is a sarcoid lesion but is planning to have further work-up with his dermatologist.  No evidence of cellulitis.  He does have obstruction on his PFTs from severe sarcoid; has never tried an albuterol inhaler.  He was asking about possible nebulizer treatments.  We will provide him with albuterol rescue inhaler and albuterol neb and see if he has any perceived benefit from these.  Steroid taper for possible sarcoid flare; refilled his daily prednisone x1.   I spent 42 minutes of dedicated to the care of this patient on the date of this encounter to include pre-visit review of records, face-to-face time with the patient discussing conditions above, post visit ordering of testing, clinical documentation with the electronic health record, making appropriate referrals as documented, and communicating necessary findings to members of the patients care team.  Clayton Bibles, NP 02/17/2022  Pt aware and understands NP's role.

## 2022-02-17 NOTE — Assessment & Plan Note (Addendum)
Very severe disease with significant fibrosis of the lungs.  He had a decline in his pulmonary function testing from January 2023 to April 2023.  He felt this was due to technique issues.  Up until July, he was feeling stable from a respiratory standpoint.  Since then, he has been struggling with cough and increased shortness of breath.  Treated with antibiotics and steroids.  Does have some improvement initially and then flares back up again.  Could have potential superimposed infection versus worsening of disease today.  See above plan.  We did discuss that it seems that there is a correlation between him stopping methotrexate and Plaquenil and the worsening of his symptoms over the last year.  He is going to get back into see [name] and is currently working on insurance options.  He does have a skin lesion on his left lower leg.  He does not think this is a sarcoid lesion but is planning to have further work-up with his dermatologist.  No evidence of cellulitis.  He does have obstruction on his PFTs from severe sarcoid; has never tried an albuterol inhaler.  He was asking about possible nebulizer treatments.  We will provide him with albuterol rescue inhaler and albuterol neb and see if he has any perceived benefit from these.  Steroid taper for possible sarcoid flare; refilled his daily prednisone x1.

## 2022-02-20 ENCOUNTER — Encounter: Payer: Self-pay | Admitting: Nurse Practitioner

## 2022-03-03 ENCOUNTER — Encounter: Payer: Self-pay | Admitting: Nurse Practitioner

## 2022-03-03 ENCOUNTER — Other Ambulatory Visit: Payer: No Typology Code available for payment source

## 2022-03-03 ENCOUNTER — Ambulatory Visit (INDEPENDENT_AMBULATORY_CARE_PROVIDER_SITE_OTHER): Payer: No Typology Code available for payment source | Admitting: Nurse Practitioner

## 2022-03-03 DIAGNOSIS — D869 Sarcoidosis, unspecified: Secondary | ICD-10-CM | POA: Diagnosis not present

## 2022-03-03 DIAGNOSIS — R058 Other specified cough: Secondary | ICD-10-CM

## 2022-03-03 DIAGNOSIS — J189 Pneumonia, unspecified organism: Secondary | ICD-10-CM

## 2022-03-03 MED ORDER — BENZONATATE 200 MG PO CAPS: 200.0000 mg | ORAL_CAPSULE | Freq: Three times a day (TID) | ORAL | 1 refills | Status: AC | PRN

## 2022-03-03 NOTE — Assessment & Plan Note (Signed)
He is struggling with a postinfectious cough/upper airway irritation. Recommended on cough control measures and measures to avoid further irritation.

## 2022-03-03 NOTE — Assessment & Plan Note (Signed)
Clinically improved.  Treated with empiric Augmentin x7 days.  Patient Instructions  Continue prednisone 5 mg daily  Continue Albuterol inhaler 2 puffs or 3 mL neb every 6 hours as needed for shortness of breath or wheezing. Notify if symptoms persist despite rescue inhaler/neb use.  Delsym 2 tsp Twice daily for cough  Tessalon perles (benzonatate) 1 capsule Three times a day for cough   Suppress your cough to allow your larynx (voice box) to heal.  Limit talking for the next few days. Avoid throat clearing. Work on cough suppression with the above recommended suppressants.  Use sugar free hard candies or non-menthol cough drops during this time to soothe your throat.  Warm tea with honey and lemon.    Contact [name] office for follow up   Follow up in 6 weeks with Dr. Chase Caller or Alanson Aly. If symptoms do not improve or worsen, please contact office for sooner follow up or seek emergency care.

## 2022-03-03 NOTE — Assessment & Plan Note (Signed)
Very severe disease with significant fibrosis of the lungs.  He had a decline in his pulmonary function testing from January 2023 to April 2023.  He felt this was due to technique issues.  We did discuss that it seems that there is a correlation between him stopping methotrexate and Plaquenil and the worsening of his symptoms over the last year.  He is going to get back into see [name] and is currently working on insurance options.  He does have a skin lesion on his left lower leg.  He does not think this is a sarcoid lesion but is planning to have further work-up with his dermatologist.  No evidence of cellulitis.

## 2022-03-03 NOTE — Patient Instructions (Addendum)
Continue prednisone 5 mg daily  Continue Albuterol inhaler 2 puffs or 3 mL neb every 6 hours as needed for shortness of breath or wheezing. Notify if symptoms persist despite rescue inhaler/neb use.  Delsym 2 tsp Twice daily for cough  Tessalon perles (benzonatate) 1 capsule Three times a day for cough   Suppress your cough to allow your larynx (voice box) to heal.  Limit talking for the next few days. Avoid throat clearing. Work on cough suppression with the above recommended suppressants.  Use sugar free hard candies or non-menthol cough drops during this time to soothe your throat.  Warm tea with honey and lemon.    Contact [name] office for follow up   Follow up in 6 weeks with Dr. Chase Caller or Alanson Aly. If symptoms do not improve or worsen, please contact office for sooner follow up or seek emergency care.

## 2022-03-03 NOTE — Progress Notes (Signed)
_0  ID: Sean Sharp, male    DOB: 29-Mar-1976, 46 y.o.   MRN: [id number]  Chief Complaint  Patient presents with   Follow-up    Referring provider: [name], [name], MD  HPI: 46 year old male, former smoker followed for pulmonary sarcoidosis.  He is a patient of [name] and last seen in office 02/17/2022 by [name].  Past medical history significant for hypertension, venous insufficiency, allergic rhinitis, OSA not on CPAP, GERD.  TEST/EVENTS:  02/22/2021 HRCT chest: Enlarged, coarsely calcified lymph nodes throughout the mediastinum and hila.  Redemonstrated very extensive fine nodularity throughout the lungs, many of which are confluent in consolidations and concentrated along the pleural surfaces and fissures and with a an upper lobe predominance.  There is extensive architectural distortion.  Unchanged, thin-walled pneumatocele of the right apex.  No significant change in a septated, cavitary, masslike consolidation in the perihilar right lung.  Significantly increased subpleural consolidation of the peripheral left lower lobe, measuring 2.9 x 1.6 cm.  Increased consolidation posteriorly in the left upper lobe abutting the fissure, largest cross-sectional area measuring 3.5 x 2.2 cm.  Increased consolidation peripherally in the superior segment right lower lobe, greatest measuring 3.4 x 2.2 cm.  Findings are consistent with worsening pulmonary sarcoidosis with features of massive fibrosis 06/13/2021 echocardiogram: EF 65 to 70%.  G1 DD.  RV size and function is normal. 07/05/2021 PFT: FVC 60, FEV1 53, ratio 72, TLC 59, DLCOcor 68 10/01/2021 PFT: FVC 56, FEV1 44, ratio 71, TLC 57, DLCOcor 58  10/01/2021: Virtual visit with Dr. Chase Caller for follow-up after PFT.  He did not have a history of sarcoidosis in 2011 but was present in 2018 and October 2020.  At the time seemed inflammatory but now with fibrotic changes.  He follows with [name] with rheumatology and is currently on  chronic prednisone 5 mg daily.  He was previously on methotrexate and Plaquenil but stopped these a few months ago.  Did recently increase his prednisone (directed); feels like he had a flare with generalized weakness though his lungs were okay.  Skin doing okay.  FVC declined 6%, DLCO down 70%.  He feels that his decline in PFTs is due to technique issues because he actually feels better from a respiratory standpoint.  He is able to climb hills and walk better than previously.  He is unsure if methotrexate actually made a difference.  Not currently inclined to restart.  Insurance also an issue.  Discussed that the progression of his disease could be related to running out of methotrexate.  Recommended full PFT in 3 months.  Continue IS at home.  02/17/2022: OV with [name] for acute visit.  He was sick in July; contacted the office with increased productive cough and was treated with prednisone taper and doxycycline.  He did initially get better but has never quite felt back to normal since then.  Then around a week ago, he contacted the office on 9/5 reporting increased productive cough with brown sputum.  Today, he reports feeling relatively unchanged if not worse.  He is still coughing up purulent sputum and having some pleuritic discomfort.  Feels like he has a lot of chest discomfort with deep breaths.  He did at one point think that he had some blood-tinged sputum with a red speck in his mucus last week but has not had any further episodes of this.  Breathing is worse than it usually is.  He gets more winded with walking from the  parking lot into the office.  Has not noticed any significant wheezing.  He has felt febrile at times; has not checked his temperature at home but thinks that he has had low-grade fever.  He was 58 F today.  He also has a lesion on his left calf.  He spoke to his primary care doctor about this who recommended that he go to his dermatologist.  He has had this for many months now.   Does not seem to get worse or better with antibiotics.  No drainage that he has noticed.  It does not occasionally get swollen and warm to the touch.  It is also tender when he touches it which is not consistent with his normal sarcoid lesions.  Denies any mass hemoptysis, night sweats, orthopnea, leg swelling, calf pain, vision changes.  No recent sick exposures.  He is on daily prednisone 5 mg but still he ran out of his prescription so has not taken any today.  Stopped methotrexate and Plaquenil over a year ago due to insurance issues.  Has not seen [name] in some time. CXR with evidence of severe sarcoid; unable to rule out pna. Treated with augmentin given infectious symptoms and prednisone taper.   03/03/2022: Today - follow up Patient presents today for follow-up after being treated for potential pneumonia/sarcoid flare.  He has completed Augmentin and prednisone courses.  He is feeling much better today.  He does still have a cough that can occasionally be paroxysmal.  Fortunately, it has improved and he is no longer having purulent sputum.  He will usually produce a very small amount of clear sputum.  He also does not feel as fatigued and is no longer having chest discomfort.  He feels like his breathing has gotten back to his baseline.  He does have some hoarseness and feels like his throat is little irritated from his coughing.  He denies any fevers, hemoptysis, night sweats, leg swelling, orthopnea.  The lesion on his left lower leg looks better.  No warmth, redness or drainage.  He has not had to use his albuterol inhaler.  He has not contacted [name] office for follow-up.  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  Hemoglobin electrophoresis normal 2016.   Chronic renal insufficiency, stage II (mild)    Chronic rhinitis    Cutaneous sarcoidosis    [place] rheum: as of 02/2019->plaquenil.  Cont plaquenil and slowly tapering prednisone as of 06/2019 rheum f/u   Hoarseness 2015   [facility] 04/2014-normal laryngoscopy: prednisone taper and bid nexium recommended.  No signif help so pt got 2nd opinion at [facility]    HTN (hypertension)    Hydrocele    left   LPRD (laryngopharyngeal reflux disease)    Obesity, Class II, BMI 35-39.9    OSA (obstructive sleep apnea) 07/28/2019   SEVERE OSA, mild central sleep apnea, CPAP trial 08/2019 (Dr. Fransico Him)   Peripheral edema    left leg>right leg   Prediabetes 02/2017   A1c 6.3% (right when he was starting to take prednisone prn for treatment of his sarcoidosis).  HbA1c 08/2017=6.3%. A1c 01/2020 6.4%.   Pulmonary arterial hypertension (  [place])    Mild (R heart cath 06/27/19), echo 06/2019 normal.  Wt loss and CPAP recommended.   Sarcoidosis of lung ([place]) 12/2016   2018, but Dr. Melvyn Novas suspects it was smoldering since 2011.  Skin bx of cutaneous lesion confirmed dx of sarcoidosis 02/2017.  Pt improving fall 2018 with plaquenil and prednisone.  Pt self d/c'd plaquenil.   Vocal cord polyps 2015   surgery [facility]     Tobacco History: Social History   Tobacco Use  Smoking Status Former   Packs/day: 0.30   Years: 1.50   Total pack years: 0.45   Types: Cigarettes   Quit date: 06/10/1995   Years since quitting: 26.7  Smokeless Tobacco Never   Counseling given: Not Answered   Outpatient Medications Prior to Visit  Medication Sig Dispense Refill   albuterol (PROVENTIL) (2.5 MG/3ML) 0.083% nebulizer solution Take 3 mLs (2.5 mg total) by nebulization every 6 (six) hours as needed for wheezing or shortness of breath. 75 mL 12   albuterol (VENTOLIN  HFA) 108 (90 Base) MCG/ACT inhaler Inhale 2 puffs into the lungs every 6 (six) hours as needed for wheezing or shortness of breath. 8 g 2   amLODipine (NORVASC) 10 MG tablet Take 1 tablet (10 mg total) by mouth daily. 90 tablet 1   Azelastine-Fluticasone 137-50 MCG/ACT SUSP Place 2 sprays into both nostrils daily.     clobetasol ointment (TEMOVATE) 0.05 % Apply topically.     Clocortolone Pivalate (CLODERM) 0.1 % cream Apply 1 application topically 2 (two) times daily as needed (Sarcoidosis/rash).      folic acid (FOLVITE) 1 MG tablet Take 1 tablet (1 mg total) by mouth daily. 30 tablet 5   hydrocortisone butyrate (LUCOID) 0.1 % CREA cream Apply 1 application topically 2 (two) times daily. (Patient taking differently: Apply 1 application  topically 2 (two) times daily. As needed) 60 g 6   hydrOXYzine (ATARAX/VISTARIL) 10 MG tablet TAKE 1 TO 3 TABLETS BY MOUTH EVERY 4 TO 6 HOURS AS NEEDED FOR ITCHING     ipratropium (ATROVENT) 0.03 % nasal spray Place 2 sprays into both nostrils every 12 (twelve) hours. 30 mL 12   levocetirizine (XYZAL) 5 MG tablet SMARTSIG:1 Tablet(s) By Mouth Every Evening     losartan (COZAAR) 50 MG tablet TAKE 1 TABLET(50 MG) BY MOUTH DAILY 90 tablet 3   meloxicam (MOBIC) 7.5 MG tablet Take 1 tablet (7.5 mg total) by mouth daily. 30 tablet 0   metoprolol succinate (TOPROL-XL) 25 MG 24 hr tablet Take 1.5 tablets (37.5 mg total) by mouth daily. 45 tablet 3   pantoprazole (PROTONIX) 40 MG tablet TAKE 1 TABLET BY MOUTH 30 TO 60 MINUTES BEFORE YOUR FIRST AND LAST MEALS OF THE DAY 180 tablet 3   predniSONE (DELTASONE) 5 MG tablet Take 1 tablet (5 mg total) by mouth daily with breakfast. 30 tablet 0   spironolactone (ALDACTONE) 25 MG tablet Take 1 tablet (25 mg total) by mouth daily. 30 tablet 0   triamcinolone cream (KENALOG) 0.1 % SMARTSIG:1 Application Topical 2-3 Times Daily     predniSONE (DELTASONE) 10 MG tablet 4 tabs for 3 days, then 3 tabs for 3 days, 2 tabs for 3 days, then 1  tab for 3 days, then resume 5 mg daily 30 tablet 0   No facility-administered medications prior to visit.     Review of Systems:   Constitutional: No weight loss or gain, night sweats, fevers, lassitude, fatigue, chills. HEENT: No headaches, difficulty swallowing, tooth/dental problems,  or sore throat. No sneezing, itching, ear ache, nasal congestion, or post nasal drip. + Throat irritation, hoarse voice CV:  No chest pain, orthopnea, PND, swelling in lower extremities, anasarca, dizziness, palpitations, syncope Resp: + Cough; shortness of breath with exertion (improved, at baseline).  No chest congestion.  No hemoptysis. No wheezing.  No chest wall deformity GI:  No heartburn, indigestion, abdominal pain, nausea, vomiting, diarrhea, change in bowel habits, loss of appetite, bloody stools.  Skin: No rash, ulcerations. +left calf lesion (improved) Neuro: No dizziness or lightheadedness.  Psych: No depression or anxiety. Mood stable.     Physical Exam:  BP (!) 138/90 (BP Location: Right Arm, Cuff Size: Normal)   Pulse 99   Ht _0  (1.88 m)   Wt 295 lb 6.4 oz (134 kg)   SpO2 96%   BMI 37.93 kg/m   GEN: Pleasant, interactive, acutely-ill appearing; obese; in no acute distress. HEENT:  Normocephalic and atraumatic. PERRLA. Sclera white. Nasal turbinates pink, moist and patent bilaterally. No rhinorrhea present. Oropharynx pink and moist, without exudate or edema. No lesions, ulcerations, or postnasal drip.  NECK:  Supple w/ fair ROM. No JVD present. Normal carotid impulses w/o bruits. Thyroid symmetrical with no goiter or nodules palpated. No lymphadenopathy.   CV: RRR, no m/r/g, no peripheral edema. Pulses intact, +2 bilaterally. No cyanosis, pallor or clubbing. PULMONARY:  Unlabored, regular breathing.  Clear bilaterally A&P without wheezes/rales/rhonchi.  No accessory muscle use. No dullness to percussion. GI: BS present and normoactive. Soft, non-tender to palpation. No organomegaly  or masses detected.  MSK: No erythema, warmth or tenderness. No deformities or joint swelling noted.  Neuro: A/Ox3. No focal deficits noted.   Skin: Warm, no rashes. Approximated lesion left lateral calf; no exudate, erythema or warmth  Psych: Normal affect and behavior. Judgement and thought content appropriate.     Lab Results:  CBC    Component Value Date/Time   WBC 9.6 01/08/2022 0901   WBC 8.0 04/02/2021 1654   RBC 6.51 (H) 01/08/2022 0901   RBC 6.07 (H) 04/02/2021 1654   HGB 13.4 01/08/2022 0901   HCT 45.4 01/08/2022 0901   PLT 292 01/08/2022 0901   MCV 70 (L) 01/08/2022 0901   MCH 20.6 (L) 01/08/2022 0901   MCH 21.4 (L) 07/21/2019 0406   MCHC 29.5 (L) 01/08/2022 0901   MCHC 31.5 04/02/2021 1654   RDW 18.8 (H) 01/08/2022 0901   LYMPHSABS 0.5 (L) 01/08/2022 0901   MONOABS 0.9 04/02/2021 1654   EOSABS 0.2 01/08/2022 0901   BASOSABS 0.1 01/08/2022 0901    BMET    Component Value Date/Time   NA 139 01/08/2022 0901   K 4.4 01/08/2022 0901   CL 102 01/08/2022 0901   CO2 23 01/08/2022 0901   GLUCOSE 104 (H) 01/08/2022 0901   GLUCOSE 94 04/02/2021 1654   BUN 18 01/08/2022 0901   CREATININE 1.18 01/08/2022 0901   CREATININE 1.43 (H) 02/28/2016 0720   CALCIUM 9.5 01/08/2022 0901   GFRNONAA 79 10/14/2019 0000   GFRAA 91 10/14/2019 0000    BNP No results found for: "BNP"   Imaging:  DG Chest 2 View  Result Date: 02/17/2022 CLINICAL DATA:  History of sarcoidosis, productive cough and low-grade fever EXAM: CHEST - 2 VIEW COMPARISON:  July 20, 2019, February 22, 2021 FINDINGS: Stable cardiomediastinal contours. Diffuse heterogeneous and nodular pulmonary opacities, including a cavitary lesion in the right upper lobe. The extent of opacification appears similar to chest CT from September 2022 allowing  for differences in imaging modality. Chronic pleural thickening.  No large pneumothorax. No acute osseous abnormality. The visualized upper abdomen is unremarkable.  IMPRESSION: Redemonstrated findings consistent with severe pulmonary sarcoidosis including diffuse chronic heterogeneous and nodular pulmonary opacities and cavitation in the right upper lobe. Overall, the extent appears similar to chest CT from September 2022 allowing for differences in imaging modality. However, in the clinical setting of productive cough and low-grade fever, a superimposed pneumonia is also possible. Electronically Signed   By: Beryle Flock M.D.   On: 02/17/2022 16:10         Latest Ref Rng & Units 10/01/2021    3:02 PM 07/05/2021    3:00 PM 09/06/2020    2:52 PM 07/24/2020    1:01 PM 04/05/2020    3:44 PM  PFT Results  FVC-Pre L 2.77  2.96  3.04  3.04  3.30   FVC-Predicted Pre % 56  60  61  61  66   FVC-Post L 2.91  3.06  3.29  3.29  3.38   FVC-Predicted Post % 59  62  66  66  68   Pre FEV1/FVC % % 63  71  70  70  75   Post FEV1/FCV % % 71  72  75  75  77   FEV1-Pre L 1.75  2.11  2.13  2.13  2.48   FEV1-Predicted Pre % 44  53  53  53  62   FEV1-Post L 2.08  2.19  2.48  2.48  2.60   DLCO uncorrected ml/min/mmHg 19.66  23.39  24.55  24.55  25.13   DLCO UNC% % 58  68  72  72  73   DLCO corrected ml/min/mmHg 19.66  23.39  24.55  24.55  25.13   DLCO COR %Predicted % 58  68  72  72  73   DLVA Predicted % 120  130  140  140  121   TLC L 4.47  4.62  4.72  4.72  4.87   TLC % Predicted % 57  59  60  60  62   RV % Predicted % 75  76  73  73  45     Lab Results  Component Value Date   NITRICOXIDE 18 12/11/2016        Assessment & Plan:   CAP (community acquired pneumonia) Clinically improved.  Treated with empiric Augmentin x7 days.  Patient Instructions  Continue prednisone 5 mg daily  Continue Albuterol inhaler 2 puffs or 3 mL neb every 6 hours as needed for shortness of breath or wheezing. Notify if symptoms persist despite rescue inhaler/neb use.  Delsym 2 tsp Twice daily for cough  Tessalon perles (benzonatate) 1 capsule Three times a day for cough    Suppress your cough to allow your larynx (voice box) to heal.  Limit talking for the next few days. Avoid throat clearing. Work on cough suppression with the above recommended suppressants.  Use sugar free hard candies or non-menthol cough drops during this time to soothe your throat.  Warm tea with honey and lemon.    Contact [name] office for follow up   Follow up in 6 weeks with Dr. Chase Caller or Alanson Aly. If symptoms do not improve or worsen, please contact office for sooner follow up or seek emergency care.     Upper airway cough syndrome He is struggling with a postinfectious cough/upper airway irritation. Recommended on cough control measures and measures to avoid  further irritation.   Sarcoidosis with skin and lung involvement  Very severe disease with significant fibrosis of the lungs.  He had a decline in his pulmonary function testing from January 2023 to April 2023.  He felt this was due to technique issues.  We did discuss that it seems that there is a correlation between him stopping methotrexate and Plaquenil and the worsening of his symptoms over the last year.  He is going to get back into see [name] and is currently working on insurance options.  He does have a skin lesion on his left lower leg.  He does not think this is a sarcoid lesion but is planning to have further work-up with his dermatologist.  No evidence of cellulitis.      I spent 32 minutes of dedicated to the care of this patient on the date of this encounter to include pre-visit review of records, face-to-face time with the patient discussing conditions above, post visit ordering of testing, clinical documentation with the electronic health record, making appropriate referrals as documented, and communicating necessary findings to members of the patients care team.  Clayton Bibles, NP 03/03/2022  Pt aware and understands NP's role.

## 2022-03-04 ENCOUNTER — Encounter: Payer: Self-pay | Admitting: Physician Assistant

## 2022-03-06 LAB — RESPIRATORY CULTURE OR RESPIRATORY AND SPUTUM CULTURE
MICRO NUMBER:: 13963751
RESULT:: NORMAL
SPECIMEN QUALITY:: ADEQUATE

## 2022-03-07 NOTE — Progress Notes (Signed)
Sputum culture negative. Thanks!

## 2022-03-18 NOTE — Progress Notes (Unsigned)
Synopsis: Referred for cough by [name], [name], MD  Subjective:   PATIENT ID: Sean Sharp GENDER: male DOB: 1975-11-28, MRN: [id number]  No chief complaint on file.  46yM with sarcoid with cutaneous and pulmonary involvement f/b Dr. Chase Caller and [name] previously on MTX/HCQ, HTN, AR, venous insufficiency, OSA not on CPAP, GERD   Seen 9/25 by Roxan Diesel and at that visit doing better after course of augmentin and prednisone, still had some cough though.    Otherwise pertinent review of systems is negative.  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  Hemoglobin electrophoresis normal 2016.   Chronic renal insufficiency, stage II (mild)    Chronic rhinitis    Cutaneous sarcoidosis    [place] rheum: as of 02/2019->plaquenil.  Cont plaquenil and slowly tapering prednisone as of 06/2019 rheum f/u   Hoarseness 2015   [facility] [place] [facility] 04/2014-normal laryngoscopy: prednisone taper and bid nexium recommended.  No signif help so pt got 2nd opinion at [facility]    HTN (hypertension)    Hydrocele    left   LPRD (laryngopharyngeal reflux disease)    Obesity, Class II, BMI 35-39.9    OSA (obstructive sleep apnea) 07/28/2019   SEVERE OSA, mild central sleep apnea, CPAP trial 08/2019 (Dr. Fransico Him)   Peripheral edema    left leg>right leg   Prediabetes 02/2017   A1c 6.3% (right when he was starting to take prednisone prn for treatment of his sarcoidosis).  HbA1c 08/2017=6.3%. A1c 01/2020 6.4%.   Pulmonary arterial hypertension (HCC)    Mild (R heart cath 06/27/19), echo 06/2019 normal.  Wt loss and CPAP recommended.   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 Melvyn Novas suspects it was smoldering since 2011.  Skin bx of cutaneous lesion confirmed dx of sarcoidosis 02/2017.  Pt improving fall 2018 with plaquenil and prednisone.  Pt self d/c'd plaquenil.   Vocal cord polyps 2015   surgery [facility]      Family History  Problem Relation Age of Onset    Sarcoidosis Father        ?   Lactose intolerance Father    Other Mother        colon issues-portion of colon removed   Lactose intolerance Mother    Cancer Paternal Grandfather        ?     Past Surgical History:  Procedure Laterality Date   OLECRANON BURSECTOMY Right 2019   Polysomnogram  07/28/2019   Severe OSA w/hypoxia, mild central sleep apnea, CPAP recommended (Dr. Fransico Him)   RIGHT HEART CATH N/A 06/27/2019   Mild PAH. Procedure: RIGHT HEART CATH;  Surgeon: Jolaine Artist, MD;  Location: [place] [facility];  Service: Cardiovascular;  Laterality: N/A;   SKIN BIOPSY  2018   Sarcoidosis of skin (also has pulm involvement).   SKIN GRAFT  2010   on left index finger   TRANSTHORACIC ECHOCARDIOGRAM  06/22/2019   NORMAL   Vocal cord surgery  2016   [facility]   WISDOM TOOTH EXTRACTION  2005    Social History   Socioeconomic History   Marital status: Married    Spouse name: Not on file   Number of children: 2   Years of education: Not on file   Highest education level: Not on file  Occupational History   Occupation: electrician  Tobacco Use   Smoking status: Former    Packs/day: 0.30  Years: 1.50    Total pack years: 0.45    Types: Cigarettes    Quit date: 06/10/1995    Years since quitting: 26.7   Smokeless tobacco: Never  Substance and Sexual Activity   Alcohol use: Yes    Comment: occasional   Drug use: No   Sexual activity: Not on file  Other Topics Concern   Not on file  Social History Narrative   Married, 2 children (7 and 98 y/o).   Occupation: Clinical biochemist Editor, commissioning).   Orig from [employer].   No Tobacco.  Rare alcohol.  No drugs.   Enjoys fishing, Environmental health practitioner, wood working, Research officer, trade union.      Social Determinants of Health   Financial Resource Strain: Not on file  Food Insecurity: Not on file  Transportation Needs: Not on file  Physical Activity: Not on file  Stress: Not on file  Social Connections: Not on file   Intimate Partner Violence: Not on file     Allergies  Allergen Reactions   Wasp Venom Swelling     Outpatient Medications Prior to Visit  Medication Sig Dispense Refill   albuterol (PROVENTIL) (2.5 MG/3ML) 0.083% nebulizer solution Take 3 mLs (2.5 mg total) by nebulization every 6 (six) hours as needed for wheezing or shortness of breath. 75 mL 12   albuterol (VENTOLIN HFA) 108 (90 Base) MCG/ACT inhaler Inhale 2 puffs into the lungs every 6 (six) hours as needed for wheezing or shortness of breath. 8 g 2   amLODipine (NORVASC) 10 MG tablet Take 1 tablet (10 mg total) by mouth daily. 90 tablet 1   Azelastine-Fluticasone 137-50 MCG/ACT SUSP Place 2 sprays into both nostrils daily.     benzonatate (TESSALON) 200 MG capsule Take 1 capsule (200 mg total) by mouth 3 (three) times daily as needed for cough. 30 capsule 1   clobetasol ointment (TEMOVATE) 0.05 % Apply topically.     Clocortolone Pivalate (CLODERM) 0.1 % cream Apply 1 application topically 2 (two) times daily as needed (Sarcoidosis/rash).      folic acid (FOLVITE) 1 MG tablet Take 1 tablet (1 mg total) by mouth daily. 30 tablet 5   hydrocortisone butyrate (LUCOID) 0.1 % CREA cream Apply 1 application topically 2 (two) times daily. (Patient taking differently: Apply 1 application  topically 2 (two) times daily. As needed) 60 g 6   hydrOXYzine (ATARAX/VISTARIL) 10 MG tablet TAKE 1 TO 3 TABLETS BY MOUTH EVERY 4 TO 6 HOURS AS NEEDED FOR ITCHING     ipratropium (ATROVENT) 0.03 % nasal spray Place 2 sprays into both nostrils every 12 (twelve) hours. 30 mL 12   levocetirizine (XYZAL) 5 MG tablet SMARTSIG:1 Tablet(s) By Mouth Every Evening     losartan (COZAAR) 50 MG tablet TAKE 1 TABLET(50 MG) BY MOUTH DAILY 90 tablet 3   meloxicam (MOBIC) 7.5 MG tablet Take 1 tablet (7.5 mg total) by mouth daily. 30 tablet 0   metoprolol succinate (TOPROL-XL) 25 MG 24 hr tablet Take 1.5 tablets (37.5 mg total) by mouth daily. 45 tablet 3   pantoprazole  (PROTONIX) 40 MG tablet TAKE 1 TABLET BY MOUTH 30 TO 60 MINUTES BEFORE YOUR FIRST AND LAST MEALS OF THE DAY 180 tablet 3   predniSONE (DELTASONE) 5 MG tablet Take 1 tablet (5 mg total) by mouth daily with breakfast. 30 tablet 0   spironolactone (ALDACTONE) 25 MG tablet Take 1 tablet (25 mg total) by mouth daily. 30 tablet 0   triamcinolone cream (KENALOG) 0.1 % SMARTSIG:1  Application Topical 2-3 Times Daily     No facility-administered medications prior to visit.       Objective:   Physical Exam:  General appearance: 46 y.o., male, NAD, conversant  Eyes: anicteric sclerae; PERRL, tracking appropriately HENT: NCAT; MMM Neck: Trachea midline; no lymphadenopathy, no JVD Lungs: CTAB, no crackles, no wheeze, with normal respiratory effort CV: RRR, no murmur  Abdomen: Soft, non-tender; non-distended, BS present  Extremities: No peripheral edema, warm Skin: Normal turgor and texture; no rash Psych: Appropriate affect Neuro: Alert and oriented to person and place, no focal deficit     There were no vitals filed for this visit.   on *** LPM *** RA BMI Readings from Last 3 Encounters:  03/03/22 37.93 kg/m  02/17/22 37.41 kg/m  02/12/22 37.09 kg/m   Wt Readings from Last 3 Encounters:  03/03/22 295 lb 6.4 oz (134 kg)  02/17/22 291 lb 6.4 oz (132.2 kg)  02/12/22 288 lb 14.4 oz (131 kg)     CBC    Component Value Date/Time   WBC 9.6 01/08/2022 0901   WBC 8.0 04/02/2021 1654   RBC 6.51 (H) 01/08/2022 0901   RBC 6.07 (H) 04/02/2021 1654   HGB 13.4 01/08/2022 0901   HCT 45.4 01/08/2022 0901   PLT 292 01/08/2022 0901   MCV 70 (L) 01/08/2022 0901   MCH 20.6 (L) 01/08/2022 0901   MCH 21.4 (L) 07/21/2019 0406   MCHC 29.5 (L) 01/08/2022 0901   MCHC 31.5 04/02/2021 1654   RDW 18.8 (H) 01/08/2022 0901   LYMPHSABS 0.5 (L) 01/08/2022 0901   MONOABS 0.9 04/02/2021 1654   EOSABS 0.2 01/08/2022 0901   BASOSABS 0.1 01/08/2022 0901    Sputum culture 9/25 OP flora  Chest  Imaging: CXR 02/17/22 reviewed by me more extensive peripheral alveolar opacities than prior   Pulmonary Functions Testing Results:    Latest Ref Rng & Units 10/01/2021    3:02 PM 07/05/2021    3:00 PM 09/06/2020    2:52 PM 07/24/2020    1:01 PM 04/05/2020    3:44 PM  PFT Results  FVC-Pre L 2.77  2.96  3.04  3.04  3.30   FVC-Predicted Pre % 56  60  61  61  66   FVC-Post L 2.91  3.06  3.29  3.29  3.38   FVC-Predicted Post % 59  62  66  66  68   Pre FEV1/FVC % % 63  71  70  70  75   Post FEV1/FCV % % 71  72  75  75  77   FEV1-Pre L 1.75  2.11  2.13  2.13  2.48   FEV1-Predicted Pre % 44  53  53  53  62   FEV1-Post L 2.08  2.19  2.48  2.48  2.60   DLCO uncorrected ml/min/mmHg 19.66  23.39  24.55  24.55  25.13   DLCO UNC% % 58  68  72  72  73   DLCO corrected ml/min/mmHg 19.66  23.39  24.55  24.55  25.13   DLCO COR %Predicted % 58  68  72  72  73   DLVA Predicted % 120  130  140  140  121   TLC L 4.47  4.62  4.72  4.72  4.87   TLC % Predicted % 57  59  60  60  62   RV % Predicted % 75  76  73  73  45  Last PFT reviewed with moderately severe obstruction and restrictoin, +BD rsponse, air trapping, moderately reduced diffusgin capacity    Echocardiogram:   TTE 06/13/21:  1. Left ventricular ejection fraction, by estimation, is 65 to 70%. The  left ventricle has normal function. The left ventricle has no regional  wall motion abnormalities. Left ventricular diastolic parameters are  consistent with Grade I diastolic  dysfunction (impaired relaxation).   2. Right ventricular systolic function is normal. The right ventricular  size is normal. Tricuspid regurgitation signal is inadequate for assessing  PA pressure.   3. The mitral valve is grossly normal. No evidence of mitral valve  regurgitation.   4. The aortic valve is tricuspid. Aortic valve regurgitation is not  visualized.   5. The inferior vena cava is normal in size with greater than 50%  respiratory variability, suggesting  right atrial pressure of 3 mmHg.   Heart Catheterization: ***    Assessment & Plan:    Plan:      Maryjane Hurter, MD [facility] 03/18/2022 2:12 PM

## 2022-03-19 ENCOUNTER — Ambulatory Visit (HOSPITAL_BASED_OUTPATIENT_CLINIC_OR_DEPARTMENT_OTHER)
Admission: RE | Admit: 2022-03-19 | Discharge: 2022-03-19 | Disposition: A | Discharge status: Home / Self Care | Payer: No Typology Code available for payment source | Source: Ambulatory Visit | Attending: Student | Admitting: Student

## 2022-03-19 ENCOUNTER — Ambulatory Visit (INDEPENDENT_AMBULATORY_CARE_PROVIDER_SITE_OTHER): Payer: Self-pay | Admitting: Student

## 2022-03-19 ENCOUNTER — Encounter: Payer: Self-pay | Admitting: Student

## 2022-03-19 ENCOUNTER — Other Ambulatory Visit: Payer: Self-pay | Admitting: Student

## 2022-03-19 VITALS — BP 136/88 | HR 93 | Temp 98.3°F | Ht 74.0 in | Wt 296.8 lb

## 2022-03-19 DIAGNOSIS — J471 Bronchiectasis with (acute) exacerbation: Secondary | ICD-10-CM

## 2022-03-19 DIAGNOSIS — R053 Chronic cough: Secondary | ICD-10-CM

## 2022-03-19 DIAGNOSIS — D869 Sarcoidosis, unspecified: Secondary | ICD-10-CM

## 2022-03-19 DIAGNOSIS — R042 Hemoptysis: Secondary | ICD-10-CM

## 2022-03-19 MED ORDER — AMOXICILLIN-POT CLAVULANATE 875-125 MG PO TABS: 1.0000 | ORAL_TABLET | Freq: Two times a day (BID) | ORAL | 0 refills | Status: AC

## 2022-03-19 MED ORDER — PREDNISONE 10 MG PO TABS: ORAL_TABLET | ORAL | 0 refills | Status: DC

## 2022-03-19 NOTE — Patient Instructions (Signed)
-  We will schedule CT Chest and PFTs  -Sputum cultures -Start augmentin 1 tablet twice daily for 2 weeks -Prednisone 30 mg daily for 2 weeks, then prednisone 20 mg daily for a month then prednisone 10 mg daily till next visit -I'll price out inhalers for you -See you in 6 weeks or sooner if need be!

## 2022-04-14 ENCOUNTER — Ambulatory Visit: Payer: No Typology Code available for payment source | Admitting: Nurse Practitioner

## 2022-04-29 NOTE — Progress Notes (Unsigned)
o  Synopsis: Referred for cough by [name], Raymond J, MD  Subjective:   PATIENT ID: Sean Sharp GENDER: male DOB: 1976/01/20, MRN: [id number]  No chief complaint on file.  46yM with sarcoid with cutaneous and pulmonary involvement f/b Dr. Chase Caller and [name] previously on MTX/HCQ, HTN, AR, venous insufficiency, OSA not on CPAP, GERD   Seen 9/25 by Roxan Diesel and at that visit doing better after course of augmentin and prednisone, still had some cough though.   Now feels similar to how he felt when he had pneumonia. Has had bloody streaks in phlegm over last couple of days. Phlegm overall decreasing still. Has had CP with cough. No fever, drenching night sweats.   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  Hemoglobin electrophoresis normal 2016.   Chronic renal insufficiency, stage II (mild)    Chronic rhinitis    Cutaneous sarcoidosis    [place] rheum: as of 02/2019->plaquenil.  Cont plaquenil and slowly tapering prednisone as of 06/2019 rheum f/u   Hoarseness 2015   [facility] 04/2014-normal laryngoscopy: prednisone taper and bid nexium recommended.  No signif help so pt got 2nd opinion at [facility]    HTN (hypertension)    Hydrocele    left   LPRD (laryngopharyngeal reflux disease)    Obesity, Class II, BMI 35-39.9    OSA (obstructive sleep apnea) 07/28/2019   SEVERE OSA, mild central sleep apnea, CPAP trial 08/2019 (Dr. Fransico Him)   Peripheral edema    left leg>right leg   Prediabetes 02/2017   A1c 6.3% (right when he was starting to take prednisone prn for treatment of his sarcoidosis).  HbA1c 08/2017=6.3%. A1c 01/2020 6.4%.   Pulmonary arterial hypertension (HCC)    Mild (R heart cath 06/27/19), echo 06/2019 normal.  Wt loss and CPAP recommended.   Sarcoidosis of lung  ([place]) 12/2016   2018, but Dr. Melvyn Novas suspects it was smoldering since 2011.  Skin bx of cutaneous lesion confirmed dx of sarcoidosis 02/2017.  Pt improving fall 2018 with plaquenil and prednisone.  Pt self d/c'd plaquenil.   Vocal cord polyps 2015   surgery [facility]      Family History  Problem Relation Age of Onset   Sarcoidosis Father        ?   Lactose intolerance Father    Other Mother        colon issues-portion of colon removed   Lactose intolerance Mother    Cancer Paternal Grandfather        ?     Past Surgical History:  Procedure Laterality Date   OLECRANON BURSECTOMY Right 2019   Polysomnogram  07/28/2019   Severe OSA w/hypoxia, mild central sleep apnea, CPAP recommended (Dr. Fransico Him)   RIGHT HEART CATH N/A 06/27/2019   Mild PAH. 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   SKIN GRAFT  2010   on left index finger   TRANSTHORACIC ECHOCARDIOGRAM  06/22/2019   NORMAL   Vocal cord surgery  2016   [facility]   WISDOM TOOTH EXTRACTION  2005    Social History   Socioeconomic History   Marital  status: Married    Spouse name: Not on file   Number of children: 2   Years of education: Not on file   Highest education level: Not on file  Occupational History   Occupation: electrician  Tobacco Use   Smoking status: Former    Packs/day: 0.30    Years: 1.50    Total pack years: 0.45    Types: Cigarettes    Quit date: 06/10/1995    Years since quitting: 26.9   Smokeless tobacco: Never  Substance and Sexual Activity   Alcohol use: Yes    Comment: occasional   Drug use: No   Sexual activity: Not on file  Other Topics Concern   Not on file  Social History Narrative   Married, 2 children (46 and 58 y/o).   Occupation: Clinical biochemist Editor, commissioning).   Orig from [employer].   No Tobacco.  Rare alcohol.   No drugs.   Enjoys fishing, Environmental health practitioner, wood working, Research officer, trade union.      Social Determinants of Health   Financial Resource Strain: Not on file  Food Insecurity: Not on file  Transportation Needs: Not on file  Physical Activity: Not on file  Stress: Not on file  Social Connections: Not on file  Intimate Partner Violence: Not on file     Allergies  Allergen Reactions   Wasp Venom Swelling     Outpatient Medications Prior to Visit  Medication Sig Dispense Refill   albuterol (PROVENTIL) (2.5 MG/3ML) 0.083% nebulizer solution Take 3 mLs (2.5 mg total) by nebulization every 6 (six) hours as needed for wheezing or shortness of breath. 75 mL 12   albuterol (VENTOLIN HFA) 108 (90 Base) MCG/ACT inhaler Inhale 2 puffs into the lungs every 6 (six) hours as needed for wheezing or shortness of breath. 8 g 2   amLODipine (NORVASC) 10 MG tablet Take 1 tablet (10 mg total) by mouth daily. 90 tablet 1   Azelastine-Fluticasone 137-50 MCG/ACT SUSP Place 2 sprays into both nostrils daily.     benzonatate (TESSALON) 200 MG capsule Take 1 capsule (200 mg total) by mouth 3 (three) times daily as needed for cough. 30 capsule 1   clobetasol ointment (TEMOVATE) 0.05 % Apply topically.     Clocortolone Pivalate (CLODERM) 0.1 % cream Apply 1 application topically 2 (two) times daily as needed (Sarcoidosis/rash).      folic acid (FOLVITE) 1 MG tablet Take 1 tablet (1 mg total) by mouth daily. 30 tablet 5   hydrocortisone butyrate (LUCOID) 0.1 % CREA cream Apply 1 application topically 2 (two) times daily. (Patient taking differently: Apply 1 application  topically 2 (two) times daily. As needed) 60 g 6   hydrOXYzine (ATARAX/VISTARIL) 10 MG tablet TAKE 1 TO 3 TABLETS BY MOUTH EVERY 4 TO 6 HOURS AS NEEDED FOR ITCHING     ipratropium (ATROVENT) 0.03 % nasal spray Place 2 sprays into both nostrils every 12 (twelve) hours. 30 mL 12   levocetirizine (XYZAL) 5 MG tablet SMARTSIG:1 Tablet(s) By Mouth Every Evening      losartan (COZAAR) 50 MG tablet TAKE 1 TABLET(50 MG) BY MOUTH DAILY 90 tablet 3   meloxicam (MOBIC) 7.5 MG tablet Take 1 tablet (7.5 mg total) by mouth daily. 30 tablet 0   metoprolol succinate (TOPROL-XL) 25 MG 24 hr tablet Take 1.5 tablets (37.5 mg total) by mouth daily. 45 tablet 3   pantoprazole (PROTONIX) 40 MG tablet TAKE 1 TABLET BY MOUTH 30 TO 60 MINUTES BEFORE YOUR FIRST AND LAST  MEALS OF THE DAY 180 tablet 3   predniSONE (DELTASONE) 10 MG tablet Start taking 3 tablets daily for 2 weeks, two tablets daily for 1 month, 1 tablet daily until next visit 150 tablet 0   predniSONE (DELTASONE) 5 MG tablet Take 1 tablet (5 mg total) by mouth daily with breakfast. 30 tablet 0   spironolactone (ALDACTONE) 25 MG tablet Take 1 tablet (25 mg total) by mouth daily. 30 tablet 0   triamcinolone cream (KENALOG) 0.1 % SMARTSIG:1 Application Topical 2-3 Times Daily     No facility-administered medications prior to visit.       Objective:   Physical Exam:  General appearance: 46 y.o., male, NAD, conversant  Eyes: anicteric sclerae; PERRL, tracking appropriately HENT: NCAT; MMM Neck: Trachea midline; no lymphadenopathy, no JVD Lungs: CTAB, no crackles, no wheeze, with normal respiratory effort CV: RRR, no murmur  Abdomen: Soft, non-tender; non-distended, BS present  Extremities: No peripheral edema, warm Skin: Normal turgor and texture; no rash Psych: Appropriate affect Neuro: Alert and oriented to person and place, no focal deficit     There were no vitals filed for this visit.    on RA BMI Readings from Last 3 Encounters:  03/19/22 38.11 kg/m  03/03/22 37.93 kg/m  02/17/22 37.41 kg/m   Wt Readings from Last 3 Encounters:  03/19/22 296 lb 12.8 oz (134.6 kg)  03/03/22 295 lb 6.4 oz (134 kg)  02/17/22 291 lb 6.4 oz (132.2 kg)     CBC    Component Value Date/Time   WBC 9.6 01/08/2022 0901   WBC 8.0 04/02/2021 1654   RBC 6.51 (H) 01/08/2022 0901   RBC 6.07 (H) 04/02/2021  1654   HGB 13.4 01/08/2022 0901   HCT 45.4 01/08/2022 0901   PLT 292 01/08/2022 0901   MCV 70 (L) 01/08/2022 0901   MCH 20.6 (L) 01/08/2022 0901   MCH 21.4 (L) 07/21/2019 0406   MCHC 29.5 (L) 01/08/2022 0901   MCHC 31.5 04/02/2021 1654   RDW 18.8 (H) 01/08/2022 0901   LYMPHSABS 0.5 (L) 01/08/2022 0901   MONOABS 0.9 04/02/2021 1654   EOSABS 0.2 01/08/2022 0901   BASOSABS 0.1 01/08/2022 0901    Sputum culture 9/25 OP flora  Chest Imaging: CXR 02/17/22 reviewed by me more extensive peripheral alveolar opacities than prior   HRCT 03/21/22 reviewed by me similar to prior save for development of 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  Pulmonary Functions Testing Results:    Latest Ref Rng & Units 10/01/2021    3:02 PM 07/05/2021    3:00 PM 09/06/2020    2:52 PM 07/24/2020    1:01 PM 04/05/2020    3:44 PM  PFT Results  FVC-Pre L 2.77  2.96  3.04  3.04  3.30   FVC-Predicted Pre % 56  60  61  61  66   FVC-Post L 2.91  3.06  3.29  3.29  3.38   FVC-Predicted Post % 59  62  66  66  68   Pre FEV1/FVC % % 63  71  70  70  75   Post FEV1/FCV % % 71  72  75  75  77   FEV1-Pre L 1.75  2.11  2.13  2.13  2.48   FEV1-Predicted Pre % 44  53  53  53  62   FEV1-Post L 2.08  2.19  2.48  2.48  2.60   DLCO uncorrected ml/min/mmHg 19.66  23.39  24.55  24.55  25.13   DLCO UNC% %  58  68  72  72  73   DLCO corrected ml/min/mmHg 19.66  23.39  24.55  24.55  25.13   DLCO COR %Predicted % 58  68  72  72  73   DLVA Predicted % 120  130  140  140  121   TLC L 4.47  4.62  4.72  4.72  4.87   TLC % Predicted % 57  59  60  60  62   RV % Predicted % 75  76  73  73  45    Last PFT reviewed with moderately severe obstruction and restrictoin, +BD rsponse, air trapping, moderately reduced diffusgin capacity    Echocardiogram:   TTE 06/13/21:  1. Left ventricular ejection fraction, by estimation, is 65 to 70%. The  left ventricle has normal function. The left ventricle has no regional  wall motion abnormalities. Left  ventricular diastolic parameters are  consistent with Grade I diastolic  dysfunction (impaired relaxation).   2. Right ventricular systolic function is normal. The right ventricular  size is normal. Tricuspid regurgitation signal is inadequate for assessing  PA pressure.   3. The mitral valve is grossly normal. No evidence of mitral valve  regurgitation.   4. The aortic valve is tricuspid. Aortic valve regurgitation is not  visualized.   5. The inferior vena cava is normal in size with greater than 50%  respiratory variability, suggesting right atrial pressure of 3 mmHg.      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 Sounds like in background he's had worse sarcoid symptom control since being off of MTX/HCQ.   Plan: -We will schedule HRCT and PFTs  -Sputum cultures -Start augmentin 1 tablet twice daily for 2 weeks -Prednisone 30 mg daily for 2 weeks, then prednisone 20 mg daily for a month then prednisone 10 mg daily till next visit -I'll price out LABA/ICS inhalers for you -flutter valve 10 slow but firm puffs twice daily after bloody cough subsides following each LABA/ICS treatment -See you in 6 weeks or sooner if need be!     Maryjane Hurter, MD [facility] 04/29/2022 11:14 AM

## 2022-04-30 ENCOUNTER — Ambulatory Visit (INDEPENDENT_AMBULATORY_CARE_PROVIDER_SITE_OTHER): Payer: No Typology Code available for payment source | Admitting: Student

## 2022-04-30 ENCOUNTER — Ambulatory Visit (INDEPENDENT_AMBULATORY_CARE_PROVIDER_SITE_OTHER): Payer: Self-pay | Admitting: Student

## 2022-04-30 VITALS — BP 130/68 | HR 82 | Temp 98.2°F | Ht 74.0 in | Wt 300.0 lb

## 2022-04-30 DIAGNOSIS — J471 Bronchiectasis with (acute) exacerbation: Secondary | ICD-10-CM

## 2022-04-30 DIAGNOSIS — J984 Other disorders of lung: Secondary | ICD-10-CM

## 2022-04-30 DIAGNOSIS — R918 Other nonspecific abnormal finding of lung field: Secondary | ICD-10-CM

## 2022-04-30 DIAGNOSIS — D869 Sarcoidosis, unspecified: Secondary | ICD-10-CM

## 2022-04-30 LAB — PULMONARY FUNCTION TEST
DL/VA % pred: 142 %
DL/VA: 6.34 ml/min/mmHg/L
DLCO cor % pred: 70 %
DLCO cor: 23.83 ml/min/mmHg
DLCO unc % pred: 70 %
DLCO unc: 23.83 ml/min/mmHg
FEF 25-75 Post: 1.8 L/sec
FEF 25-75 Pre: 1.49 L/sec
FEF2575-%Change-Post: 20 %
FEF2575-%Pred-Post: 44 %
FEF2575-%Pred-Pre: 36 %
FEV1-%Change-Post: 5 %
FEV1-%Pred-Post: 48 %
FEV1-%Pred-Pre: 46 %
FEV1-Post: 2.24 L
FEV1-Pre: 2.13 L
FEV1FVC-%Change-Post: 0 %
FEV1FVC-%Pred-Pre: 91 %
FEV6-%Change-Post: 6 %
FEV6-%Pred-Post: 55 %
FEV6-%Pred-Pre: 51 %
FEV6-Post: 3.13 L
FEV6-Pre: 2.95 L
FEV6FVC-%Pred-Post: 103 %
FEV6FVC-%Pred-Pre: 103 %
FVC-%Change-Post: 5 %
FVC-%Pred-Post: 53 %
FVC-%Pred-Pre: 50 %
FVC-Post: 3.13 L
FVC-Pre: 2.96 L
Post FEV1/FVC ratio: 72 %
Post FEV6/FVC ratio: 100 %
Pre FEV1/FVC ratio: 72 %
Pre FEV6/FVC Ratio: 100 %
RV % pred: 82 %
RV: 1.8 L
TLC % pred: 61 %
TLC: 4.81 L

## 2022-04-30 MED ORDER — POSACONAZOLE 100 MG PO TBEC: DELAYED_RELEASE_TABLET | ORAL | 2 refills | Status: DC

## 2022-04-30 MED ORDER — PREDNISONE 5 MG PO TABS: ORAL_TABLET | ORAL | 1 refills | Status: DC

## 2022-04-30 NOTE — Progress Notes (Signed)
Full PFT performed today.

## 2022-04-30 NOTE — Patient Instructions (Addendum)
-  labs today - Try to decrease to prednisone 5 mg daily. If you feel worse after decreasing to prednisone 5 mg daily - We will try to find a way to prescribe you an antifungal medication - when you start posaconazole for fungus infection, need to stop pantoprazole and follow lifestyle measures below to decrease your GERD - see you in 2 months with repeat CT Chest

## 2022-04-30 NOTE — Patient Instructions (Signed)
Full PFT performed today.

## 2022-05-03 LAB — SPECIMEN STATUS REPORT

## 2022-05-03 LAB — ASPERGILLUS ANTIGEN, BAL/SERUM

## 2022-05-04 LAB — ASPERGILLUS FUMIGATUS IGG: Aspergillus fumigatus IgG: >200 mg/L — ABNORMAL HIGH (ref 0.0–55.9)

## 2022-05-05 ENCOUNTER — Telehealth: Payer: Self-pay | Admitting: Pharmacist

## 2022-05-05 ENCOUNTER — Other Ambulatory Visit (HOSPITAL_COMMUNITY): Payer: Self-pay

## 2022-05-05 NOTE — Telephone Encounter (Addendum)
Neither posaconazole or voriconazole are affordable for uninsured patients. Posaconazole does have patient assistance program through [employer] but may be 2-3 weeks turnaround time once forms are submitted.  Spoke with patient. Application mailed to him today.  Will place provider portion in Dr. Glenetta Hew mailbox for signature.  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 Treating empirically. I would hope for posaconazole due to better tolerance, less need for monitoring but if stuck with vori or something else that is ok.  Thanks! Sean Sharp

## 2022-05-09 NOTE — Telephone Encounter (Signed)
Prescriber portion received and placed in "Awaiting Response" folder.

## 2022-05-12 NOTE — Telephone Encounter (Signed)
Patient called me and stated that he will reach out to [facility] and see how much cash price for medication is as he is considering just paying out of pocket for it. He will call me again with what he decides  [name] Saliva, PharmD, MPH, BCPS, CPP Clinical Pharmacist (Rheumatology and Pulmonology)

## 2022-05-13 NOTE — Telephone Encounter (Signed)
Received signed patient form. Submitted Patient Assistance Application to  [facility]  for  [place]  along with provider portion, patient portion, med list. Will update patient when we receive a response.  Fax# [phone] (had to call PAP to retrieve this) Phone# [phone]  [name] Saliva, PharmD, MPH, BCPS, CPP Clinical Pharmacist (Rheumatology and Pulmonology)

## 2022-05-13 NOTE — Telephone Encounter (Signed)
Patient emailed form to me but unable to open due to firewall. Called pt and he states he will resend today  [name] Saliva, PharmD, MPH, BCPS, CPP Clinical Pharmacist (Rheumatology and Pulmonology)

## 2022-05-15 NOTE — Telephone Encounter (Addendum)
Called [employer] for f/u on status of Noxafil application. Per rep, they were not able to pull income check through. Will need letter of attestation regarding income. Completed and faxed  Phone# [phone] Fax# [phone]  Ref # [id number]  [name] Saliva, PharmD, MPH, BCPS, CPP Clinical Pharmacist (Rheumatology and Pulmonology)

## 2022-05-19 NOTE — Telephone Encounter (Signed)
Called [employer] for update on Noxafil application. Spoke with Janace Hoard at [employer] program. Received a fax from   [employer]  regarding an approval for  Noxafil  patient assistance from 05/16/2022 to 05/16/2023.  Phone# [phone] Fax# [phone]  Ref # [id number]  Rx has been transferred to pharmacy. [facility] [phone]) UPS tracking # [phone]  ATC patient regarding approval. VM box is full. Sent email to patient.  [name] Saliva, PharmD, MPH, BCPS, CPP Clinical Pharmacist (Rheumatology and Pulmonology)

## 2022-05-20 NOTE — Telephone Encounter (Signed)
Called patient advised of approval for Noxafil patient assistance. Per tracking info, package was delivered this afternoon to patient's home. Routing to Dr. Verlee Monte as FYI  Patient expressed gratitude.  [name] Saliva, PharmD, MPH, BCPS, CPP Clinical Pharmacist (Rheumatology and Pulmonology)

## 2022-05-21 ENCOUNTER — Telehealth: Payer: Self-pay | Admitting: Student

## 2022-05-21 NOTE — Telephone Encounter (Signed)
ATC patient but mailbox is full. Will try again.

## 2022-05-21 NOTE — Telephone Encounter (Signed)
Patient called to speak with the nurse regarding the dosing of his medication.  Please call to discuss at [phone]

## 2022-05-22 NOTE — Telephone Encounter (Signed)
Called patient again this morning but mailbox is too full for me to leave voicemail. Closing encounter since this is the send attempt to reach patient. Nothing further needed

## 2022-06-13 ENCOUNTER — Telehealth: Payer: Self-pay | Admitting: Student

## 2022-06-13 NOTE — Telephone Encounter (Signed)
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

## 2022-06-13 NOTE — Telephone Encounter (Signed)
Dr. Verlee Monte patient last seen 04/30/22 for cavitating lung mass and sarcoidosis.   Called and spoke to patient.  He stated that his cough subsided with Noxafil. He caught a cold from his wife >1.5w ago and developed runny nose, headache mild head congestion and prod cough with clear mucus. Sx have improved, however he now has chest congestion and prod cough light yellow sputum. He has not tested for covid or flu.  He has not been using albuterol.  Currently taking prednisone 69m. This is from a left over Rx. He completed Rx of prednisone 525mthat Dr. MeVerlee Monterescribed and he was unable to get a refill.   TP please advise. Dr. MeVerlee Montes unavailable.

## 2022-06-13 NOTE — Telephone Encounter (Signed)
PT calling saying he has phlegm in his chest. Wants to speak w/Nurse. Pls call @ [phone]

## 2022-06-13 NOTE — Telephone Encounter (Signed)
I tried calling the pt and there was no answer- LMTCB.

## 2022-06-16 ENCOUNTER — Ambulatory Visit (HOSPITAL_BASED_OUTPATIENT_CLINIC_OR_DEPARTMENT_OTHER): Payer: No Typology Code available for payment source | Admitting: Family Medicine

## 2022-06-16 MED ORDER — PREDNISONE 10 MG PO TABS: 10.0000 mg | ORAL_TABLET | Freq: Every day | ORAL | 0 refills | Status: DC

## 2022-06-16 NOTE — Telephone Encounter (Signed)
I called and spoke with the pt and notified of response per Tammy  He verbalized understanding  Nothing further needed

## 2022-06-16 NOTE — Addendum Note (Signed)
Addended by: Maryjane Hurter on: 06/16/2022 12:55 PM   Modules accepted: Orders

## 2022-06-16 NOTE — Telephone Encounter (Signed)
Can  you let him know I refilled his prednisone, should continue prednisone 10 mg daily till next clinic visit.

## 2022-06-21 NOTE — Progress Notes (Signed)
o  Synopsis: Referred for cough by [name], [name], MD  Subjective:   PATIENT ID: Sean Sharp GENDER: male DOB: [birth year], MRN: [id number]  Chief Complaint  Patient presents with   Follow-up    Pt states his cough is much improved. Breathing is doing well. No new co's.    46yM with sarcoid with cutaneous and pulmonary involvement f/b Dr. Chase Caller and [name] previously on MTX/HCQ (had been off of MTX), HTN, AR, venous insufficiency, OSA not on CPAP, GERD   Seen 9/25 by Roxan Diesel and at that visit doing better after course of augmentin and prednisone, still had some cough though.   Now feels similar to how he felt when he had pneumonia. Has had bloody streaks in phlegm over last couple of days. Phlegm overall decreasing still. Has had CP with cough. No fever, drenching night sweats.   Interval HPI   Started on posaconazole and continued on prednisone 10 mg daily  No phlegm now, no bothersome cough. Down to prednisone 5 mg daily now.   Otherwise pertinent review of systems is negative.  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  Hemoglobin electrophoresis normal 2016.   Chronic renal insufficiency, stage II (mild)    Chronic rhinitis    Cutaneous sarcoidosis    [place] rheum: as of 02/2019->plaquenil.  Cont plaquenil and slowly tapering prednisone as of 06/2019 rheum f/u   Hoarseness 2015   [facility] 04/2014-normal laryngoscopy: prednisone taper and bid nexium recommended.  No signif help so pt got 2nd opinion at [facility]    HTN (hypertension)    Hydrocele    left   LPRD (laryngopharyngeal reflux disease)    Obesity, Class II, BMI 35-39.9    OSA (obstructive sleep apnea) 07/28/2019   SEVERE OSA, mild central sleep apnea, CPAP trial 08/2019 (Dr. Fransico Him)   Peripheral edema    left leg>right leg   Prediabetes 02/2017   A1c 6.3% (right when he was starting to take prednisone prn for treatment of his sarcoidosis).  HbA1c  08/2017=6.3%. A1c 01/2020 6.4%.   Pulmonary arterial hypertension (HCC)    Mild (R heart cath 06/27/19), echo 06/2019 normal.  Wt loss and CPAP recommended.   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 Melvyn Novas suspects it was smoldering since 2011.  Skin bx of cutaneous lesion confirmed dx of sarcoidosis 02/2017.  Pt improving fall 2018 with plaquenil and prednisone.  Pt self d/c'd plaquenil.   Vocal cord polyps 2015   surgery [facility]      Family History  Problem Relation Age of Onset   Sarcoidosis Father        ?   Lactose intolerance Father    Other Mother        colon issues-portion of colon removed   Lactose intolerance Mother    Cancer Paternal Grandfather        ?     Past Surgical History:  Procedure Laterality Date   OLECRANON BURSECTOMY Right 2019   Polysomnogram  07/28/2019   Severe OSA w/hypoxia, mild central sleep apnea, CPAP recommended (Dr. Fransico Him)   RIGHT HEART CATH N/A 06/27/2019   Mild PAH. Procedure: RIGHT HEART CATH;  Surgeon: Jolaine Artist, MD;  Location: [facility];  Service: Cardiovascular;  Laterality: N/A;   SKIN BIOPSY  2018   Sarcoidosis of skin (also has pulm involvement).   SKIN GRAFT  2010   on left  index finger   TRANSTHORACIC ECHOCARDIOGRAM  06/22/2019   NORMAL   Vocal cord surgery  2016   [facility]   WISDOM TOOTH EXTRACTION  2005    Social History   Socioeconomic History   Marital status: Married    Spouse name: Not on file   Number of children: 2   Years of education: Not on file   Highest education level: Not on file  Occupational History   Occupation: electrician  Tobacco Use   Smoking status: Former    Packs/day: 0.30    Years: 1.50    Total pack years: 0.45    Types: Cigarettes    Quit date: 06/10/1995    Years since quitting: 27.0   Smokeless tobacco: Never  Substance and Sexual Activity   Alcohol use: Yes    Comment: occasional   Drug use: No   Sexual activity: Not on file  Other Topics Concern   Not on  file  Social History Narrative   Married, 2 children (40 and 65 y/o).   Occupation: Clinical biochemist Editor, commissioning).   Orig from [facility].   No Tobacco.  Rare alcohol.  No drugs.   Enjoys fishing, Environmental health practitioner, wood working, Research officer, trade union.      Social Determinants of Health   Financial Resource Strain: Not on file  Food Insecurity: Not on file  Transportation Needs: Not on file  Physical Activity: Not on file  Stress: Not on file  Social Connections: Not on file  Intimate Partner Violence: Not on file     Allergies  Allergen Reactions   Wasp Venom Swelling     Outpatient Medications Prior to Visit  Medication Sig Dispense Refill   albuterol (PROVENTIL) (2.5 MG/3ML) 0.083% nebulizer solution Take 3 mLs (2.5 mg total) by nebulization every 6 (six) hours as needed for wheezing or shortness of breath. 75 mL 12   albuterol (VENTOLIN HFA) 108 (90 Base) MCG/ACT inhaler Inhale 2 puffs into the lungs every 6 (six) hours as needed for wheezing or shortness of breath. 8 g 2   amLODipine (NORVASC) 10 MG tablet Take 1 tablet (10 mg total) by mouth daily. 90 tablet 1   Azelastine-Fluticasone 137-50 MCG/ACT SUSP Place 2 sprays into both nostrils daily.     benzonatate (TESSALON) 200 MG capsule Take 1 capsule (200 mg total) by mouth 3 (three) times daily as needed for cough. 30 capsule 1   clobetasol ointment (TEMOVATE) 0.05 % Apply topically.     Clocortolone Pivalate (CLODERM) 0.1 % cream Apply 1 application topically 2 (two) times daily as needed (Sarcoidosis/rash).      folic acid (FOLVITE) 1 MG tablet Take 1 tablet (1 mg total) by mouth daily. 30 tablet 5   hydrocortisone butyrate (LUCOID) 0.1 % CREA cream Apply 1 application topically 2 (two) times daily. (Patient taking differently: Apply 1 application  topically 2 (two) times daily. As needed) 60 g 6   hydrOXYzine (ATARAX/VISTARIL) 10 MG tablet TAKE 1 TO 3 TABLETS BY MOUTH EVERY 4 TO 6 HOURS AS NEEDED FOR ITCHING     ipratropium  (ATROVENT) 0.03 % nasal spray Place 2 sprays into both nostrils every 12 (twelve) hours. 30 mL 12   levocetirizine (XYZAL) 5 MG tablet SMARTSIG:1 Tablet(s) By Mouth Every Evening     losartan (COZAAR) 50 MG tablet TAKE 1 TABLET(50 MG) BY MOUTH DAILY 90 tablet 3   meloxicam (MOBIC) 7.5 MG tablet Take 1 tablet (7.5 mg total) by mouth daily. 30 tablet 0  metoprolol succinate (TOPROL-XL) 25 MG 24 hr tablet Take 1.5 tablets (37.5 mg total) by mouth daily. 45 tablet 3   pantoprazole (PROTONIX) 40 MG tablet TAKE 1 TABLET BY MOUTH 30 TO 60 MINUTES BEFORE YOUR FIRST AND LAST MEALS OF THE DAY 180 tablet 3   posaconazole (NOXAFIL) 100 MG TBEC delayed-release tablet Take 3 tablets twice daily on day 1, then take 3 tablets daily 60 tablet 2   predniSONE (DELTASONE) 5 MG tablet Take 5 mg by mouth daily with breakfast.     spironolactone (ALDACTONE) 25 MG tablet Take 1 tablet (25 mg total) by mouth daily. 30 tablet 0   triamcinolone cream (KENALOG) 0.1 % SMARTSIG:1 Application Topical 2-3 Times Daily     predniSONE (DELTASONE) 10 MG tablet Take 1 tablet (10 mg total) by mouth daily with breakfast. 20 tablet 0   No facility-administered medications prior to visit.       Objective:   Physical Exam:  General appearance: 47 y.o., male, NAD, conversant  Eyes: anicteric sclerae; PERRL, tracking appropriately HENT: NCAT; MMM Neck: Trachea midline; no lymphadenopathy, no JVD Lungs: CTAB, no crackles, no wheeze, with normal respiratory effort CV: RRR, no murmur  Abdomen: Soft, non-tender; non-distended, BS present  Extremities: No peripheral edema, warm Skin: Normal turgor and texture; no rash Psych: Appropriate affect Neuro: Alert and oriented to person and place, no focal deficit     Vitals:   06/25/22 1455  BP: 130/70  Pulse: 76  Temp: 97.8 F (36.6 C)  TempSrc: Oral  SpO2: 100%  Weight: 299 lb 3.2 oz (135.7 kg)  Height: _0  (1.88 m)     100% on RA BMI Readings from Last 3 Encounters:   06/25/22 38.42 kg/m  04/30/22 38.52 kg/m  03/19/22 38.11 kg/m   Wt Readings from Last 3 Encounters:  06/25/22 299 lb 3.2 oz (135.7 kg)  04/30/22 300 lb (136.1 kg)  03/19/22 296 lb 12.8 oz (134.6 kg)     CBC    Component Value Date/Time   WBC 9.6 01/08/2022 0901   WBC 8.0 04/02/2021 1654   RBC 6.51 (H) 01/08/2022 0901   RBC 6.07 (H) 04/02/2021 1654   HGB 13.4 01/08/2022 0901   HCT 45.4 01/08/2022 0901   PLT 292 01/08/2022 0901   MCV 70 (L) 01/08/2022 0901   MCH 20.6 (L) 01/08/2022 0901   MCH 21.4 (L) 07/21/2019 0406   MCHC 29.5 (L) 01/08/2022 0901   MCHC 31.5 04/02/2021 1654   RDW 18.8 (H) 01/08/2022 0901   LYMPHSABS 0.5 (L) 01/08/2022 0901   MONOABS 0.9 04/02/2021 1654   EOSABS 0.2 01/08/2022 0901   BASOSABS 0.1 01/08/2022 0901    Sputum culture 9/25 OP flora  Chest Imaging: CXR 02/17/22 reviewed by me more extensive peripheral alveolar opacities than prior   HRCT 03/21/22 reviewed by me similar to prior save for development of 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  Pulmonary Functions Testing Results:    Latest Ref Rng & Units 04/30/2022    1:58 PM 10/01/2021    3:02 PM 07/05/2021    3:00 PM 09/06/2020    2:52 PM 07/24/2020    1:01 PM 04/05/2020    3:44 PM  PFT Results  FVC-Pre L 2.96  2.77  2.96  3.04  3.04  3.30   FVC-Predicted Pre % 50  56  60  61  61  66   FVC-Post L 3.13  2.91  3.06  3.29  3.29  3.38   FVC-Predicted Post % 53  59  62  66  66  68   Pre FEV1/FVC % % 72  63  71  70  70  75   Post FEV1/FCV % % 72  71  72  75  75  77   FEV1-Pre L 2.13  1.75  2.11  2.13  2.13  2.48   FEV1-Predicted Pre % 46  44  53  53  53  62   FEV1-Post L 2.24  2.08  2.19  2.48  2.48  2.60   DLCO uncorrected ml/min/mmHg 23.83  19.66  23.39  24.55  24.55  25.13   DLCO UNC% % 70  58  68  72  72  73   DLCO corrected ml/min/mmHg 23.83  19.66  23.39  24.55  24.55  25.13   DLCO COR %Predicted % 70  58  68  72  72  73   DLVA Predicted % 142  120  130  140  140  121   TLC L 4.81  4.47  4.62   4.72  4.72  4.87   TLC % Predicted % 61  57  59  60  60  62   RV % Predicted % 82  75  76  73  73  45    04/30/22 PFT severe restriction, mildly reduced diffusing capacity. No clinically significant decline in lung function  10/01/21 PFT reviewed with moderately severe obstruction and restrictoin, +BD rsponse, air trapping, moderately reduced diffusgin capacity    Echocardiogram:   TTE 06/13/21:  1. Left ventricular ejection fraction, by estimation, is 65 to 70%. The  left ventricle has normal function. The left ventricle has no regional  wall motion abnormalities. Left ventricular diastolic parameters are  consistent with Grade I diastolic  dysfunction (impaired relaxation).   2. Right ventricular systolic function is normal. The right ventricular  size is normal. Tricuspid regurgitation signal is inadequate for assessing  PA pressure.   3. The mitral valve is grossly normal. No evidence of mitral valve  regurgitation.   4. The aortic valve is tricuspid. Aortic valve regurgitation is not  visualized.   5. The inferior vena cava is normal in size with greater than 50%  respiratory variability, suggesting right atrial pressure of 3 mmHg.      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 Very little smoking history. Offered nav bronch/TBLB/BAL however he worries about cost until he gets insurance 06/09/22 and earliest he'd want to schedule bronch would be 06/12/22. I'd hate to leave this untreated that long however and so we elected to treat empirically. Clinically responding well to posaconazole.  Plan: -continue posaconazole -repeat CT Chest late March -prednisone 5 mg daily  -encouraged to seek appointment with derm for possibly active sarcoid rash -Stop protonix while on  posaconazole and practice lifestyle measures for GERD -See you in April or sooner if need be!     Maryjane Hurter, MD [facility] 06/25/2022 3:06 PM

## 2022-06-25 ENCOUNTER — Ambulatory Visit (INDEPENDENT_AMBULATORY_CARE_PROVIDER_SITE_OTHER): Payer: BC Managed Care – PPO | Admitting: Student

## 2022-06-25 ENCOUNTER — Encounter: Payer: Self-pay | Admitting: Student

## 2022-06-25 VITALS — BP 130/70 | HR 76 | Temp 97.8°F | Ht 74.0 in | Wt 299.2 lb

## 2022-06-25 DIAGNOSIS — D869 Sarcoidosis, unspecified: Secondary | ICD-10-CM

## 2022-06-25 DIAGNOSIS — B479 Mycetoma, unspecified: Secondary | ICD-10-CM

## 2022-06-25 MED ORDER — PREDNISONE 5 MG PO TABS: 5.0000 mg | ORAL_TABLET | Freq: Every day | ORAL | 0 refills | Status: DC

## 2022-06-25 NOTE — Patient Instructions (Addendum)
-  We will check into next shipment of noxafil (posaconazole). As soon as this is filled would resume 3 tablets (300 mg total) daily.  - we will check CT Chest in late March and then clinic visit afterward - would check in with your dermatologist about your rash

## 2022-06-26 ENCOUNTER — Telehealth: Payer: Self-pay | Admitting: Student

## 2022-06-26 MED ORDER — POSACONAZOLE 100 MG PO TBEC: DELAYED_RELEASE_TABLET | ORAL | 1 refills | Status: DC

## 2022-06-26 NOTE — Telephone Encounter (Signed)
-----  Message from [facility], [place] sent at 06/25/2022  3:23 PM EST ----- Regarding: RE: noxafil It looks like the patient has insurance now. Prescription for refills need to be sent to pharmacy since the program only covered him while he was uninsured.  Thanks!   ----- Message ----- From: Maryjane Hurter, MD Sent: 06/25/2022   3:13 PM EST To: Cassandria Anger, RPH-CPP Subject: noxafil                                        Do you know if he will be mailed his two refills of noxafil? He says he's been out of it for 5 days.  Thanks!!  Nate

## 2022-07-16 ENCOUNTER — Other Ambulatory Visit (HOSPITAL_BASED_OUTPATIENT_CLINIC_OR_DEPARTMENT_OTHER): Payer: Self-pay

## 2022-07-22 ENCOUNTER — Telehealth: Payer: Self-pay | Admitting: Student

## 2022-07-22 NOTE — Telephone Encounter (Signed)
PT states anti-fungul meds were not sent to [facility]  on [address]. He said Dr. Jerilynn Mages. Told him he'd call them in. Please call PT to advise.   AVS notes read as follows: We will check into next shipment of noxafil (posaconazole). As soon as this is filled would resume 3 tablets (300 mg total) daily.  - we will check CT Chest in late March and then clinic visit afterward - would check in with your dermatologist about your rash   He is at [phone]

## 2022-07-23 MED ORDER — POSACONAZOLE 100 MG PO TBEC: 300.0000 mg | DELAYED_RELEASE_TABLET | Freq: Every day | ORAL | 2 refills | Status: DC

## 2022-07-23 NOTE — Telephone Encounter (Signed)
Called and spoke with pt to verify medication and have sent medication to preferred pharmacy for pt. Nothing further needed.

## 2022-07-24 LAB — LAB REPORT - SCANNED: eGFR: 84

## 2022-07-28 ENCOUNTER — Other Ambulatory Visit (HOSPITAL_COMMUNITY): Payer: Self-pay

## 2022-07-28 ENCOUNTER — Telehealth: Payer: Self-pay | Admitting: Pharmacist

## 2022-07-28 NOTE — Telephone Encounter (Signed)
pt. calling to get help with posaconazole (NOXAFIL) he cant afford to pay what it cost at pharmacy please advise

## 2022-07-28 NOTE — Telephone Encounter (Signed)
Clinical questions submitted to [employer] via CMM  Key: [id number]  [name] Saliva, PharmD, MPH, BCPS, CPP Clinical Pharmacist (Rheumatology and Pulmonology)

## 2022-07-28 NOTE — Telephone Encounter (Signed)
Patient has [employer] retail insurance now.  He would qualify for copay card. Per test claim, prior authorization is required.  Submitted a Prior Authorization request to [facility] for  posaconazole  via CoverMyMeds. Pending clinical questions to populate  Key: [id number]  [name] Saliva, PharmD, MPH, BCPS, CPP Clinical Pharmacist (Rheumatology and Pulmonology)

## 2022-07-29 ENCOUNTER — Other Ambulatory Visit (HOSPITAL_COMMUNITY): Payer: Self-pay

## 2022-07-29 NOTE — Telephone Encounter (Signed)
Received notification from [facility] regarding a prior authorization for  posaconazole . Authorization has been APPROVED from 07/28/2022 to 07/29/2023. Approval letter sent to scan center.  Per test claim, copay for 30 days supply is $567.10. Insurance picks up about $6500. Test claim for brand name NOXAFIL shows copay of >$2000 for 30 day fill  He is not locked into any pharmacy and this is not  specialty drug under his plan.  Authorization # [id number]  I spoke with patient - advised him of this copay. He states it's not affordable for him and requested exploring alternative antifungals that may be less cost to him. Unfortunately since he has insurance now, he does not qualify for patient assistance program  [name] Saliva, PharmD, MPH, BCPS, CPP Clinical Pharmacist (Rheumatology and Pulmonology)

## 2022-08-01 NOTE — Telephone Encounter (Signed)
Dr. Verlee Monte, please advise on this for pt.

## 2022-08-02 ENCOUNTER — Emergency Department (HOSPITAL_COMMUNITY)
Admission: EM | Admit: 2022-08-02 | Discharge: 2022-08-02 | Disposition: A | Discharge status: Home / Self Care | Payer: BC Managed Care – PPO | Attending: Emergency Medicine | Admitting: Emergency Medicine

## 2022-08-02 ENCOUNTER — Encounter (HOSPITAL_COMMUNITY): Payer: Self-pay

## 2022-08-02 ENCOUNTER — Other Ambulatory Visit: Payer: Self-pay

## 2022-08-02 ENCOUNTER — Emergency Department (HOSPITAL_COMMUNITY): Payer: BC Managed Care – PPO

## 2022-08-02 DIAGNOSIS — R079 Chest pain, unspecified: Secondary | ICD-10-CM | POA: Diagnosis present

## 2022-08-02 DIAGNOSIS — R0789 Other chest pain: Secondary | ICD-10-CM | POA: Diagnosis not present

## 2022-08-02 LAB — CBC
HCT: 48.3 % (ref 39.0–52.0)
Hemoglobin: 14.7 g/dL (ref 13.0–17.0)
MCH: 21.6 pg — ABNORMAL LOW (ref 26.0–34.0)
MCHC: 30.4 g/dL (ref 30.0–36.0)
MCV: 70.9 fL — ABNORMAL LOW (ref 80.0–100.0)
Platelets: 229 10*3/uL (ref 150–400)
RBC: 6.81 MIL/uL — ABNORMAL HIGH (ref 4.22–5.81)
RDW: 18.8 % — ABNORMAL HIGH (ref 11.5–15.5)
WBC: 7.7 10*3/uL (ref 4.0–10.5)
nRBC: 0 % (ref 0.0–0.2)

## 2022-08-02 LAB — BASIC METABOLIC PANEL
Anion gap: 8 (ref 5–15)
BUN: 16 mg/dL (ref 6–20)
CO2: 23 mmol/L (ref 22–32)
Calcium: 9 mg/dL (ref 8.9–10.3)
Chloride: 105 mmol/L (ref 98–111)
Creatinine, Ser: 1.17 mg/dL (ref 0.61–1.24)
GFR, Estimated: >60 mL/min (ref >60)
Glucose, Bld: 98 mg/dL (ref 70–99)
Potassium: 4.1 mmol/L (ref 3.5–5.1)
Sodium: 136 mmol/L (ref 135–145)

## 2022-08-02 LAB — TROPONIN I (HIGH SENSITIVITY): Troponin I (High Sensitivity): 3 ng/L (ref <18)

## 2022-08-02 MED ORDER — ACETAMINOPHEN 500 MG PO TABS
1000.0000 mg | ORAL_TABLET | Freq: Once | ORAL | Status: AC
  Administered 2022-08-02: 1000 mg via ORAL
  Filled 2022-08-02: qty 2

## 2022-08-02 MED ORDER — IOHEXOL 350 MG/ML SOLN
100.0000 mL | Freq: Once | INTRAVENOUS | Status: AC | PRN
  Administered 2022-08-02: 100 mL via INTRAVENOUS

## 2022-08-02 NOTE — ED Provider Notes (Signed)
[facility] Provider Note   CSN: [id number] Arrival date & time: 08/02/22  1821     History  Chief Complaint  Patient presents with   Chest Pain    Sean Sharp is a 47 y.o. male.  47 year old male with prior medical history as detailed below presents for evaluation.  Patient reports intermittent right-sided chest discomfort.  This has been ongoing for the last 1 to 2 weeks.  Patient reports feeling mild worsening shortness of breath associated with same.  Patient denies fever.  Patient denies associated nausea or vomiting.  Patient with established history of pulmonary sarcoidosis and is followed by Dr. Verlee Monte.  Patient reports currently taking prednisone approximately 10 to 15 mg/day.  Patient reports significant worsening of cough.  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    The history is provided by the patient and medical records.       Home Medications Prior to Admission medications   Medication Sig Start Date End Date Taking? Authorizing Provider  albuterol (PROVENTIL) (2.5 MG/3ML) 0.083% nebulizer solution Take 3 mLs (2.5 mg total) by nebulization every 6 (six) hours as needed for wheezing or shortness of breath. 02/17/22   Cobb, Karie Schwalbe, NP  albuterol (VENTOLIN HFA) 108 (90 Base) MCG/ACT inhaler Inhale 2 puffs into the lungs every 6 (six) hours as needed for wheezing or shortness of breath. 02/17/22   Cobb, Karie Schwalbe, NP  amLODipine (NORVASC) 10 MG tablet Take 1 tablet (10 mg total) by mouth daily. 02/12/22   [name], Raymond J, MD  Azelastine-Fluticasone [phone] MCG/ACT SUSP Place 2 sprays into both nostrils daily. 09/10/20   [provider]  benzonatate (TESSALON) 200 MG capsule Take 1 capsule (200 mg total) by mouth 3 (three) times daily as needed for cough. 03/03/22 03/03/23  Cobb, Karie Schwalbe, NP  clobetasol  ointment (TEMOVATE) 0.05 % Apply topically. 09/17/20   [provider]  Clocortolone Pivalate (CLODERM) 0.1 % cream Apply 1 application topically 2 (two) times daily as needed (Sarcoidosis/rash).  03/24/19   [provider]  folic acid (FOLVITE) 1 MG tablet Take 1 tablet (1 mg total) by mouth daily. 04/02/21   Brand Males, MD  hydrocortisone butyrate (LUCOID) 0.1 % CREA cream Apply 1 application topically 2 (two) times daily. Patient taking differently: Apply 1 application  topically 2 (two) times daily. As needed 09/04/17   McGowen, Adrian Blackwater, MD  hydrOXYzine (ATARAX/VISTARIL) 10 MG tablet TAKE 1 TO 3 TABLETS BY MOUTH EVERY 4 TO 6 HOURS AS NEEDED FOR ITCHING 09/17/20   [provider]  ipratropium (ATROVENT) 0.03 % nasal spray Place 2 sprays into both nostrils every 12 (twelve) hours. 01/11/21   Mc[place]owen, Adrian Blackwater, MD  levocetirizine (XYZAL) 5 MG tablet SMARTSIG:1 Tablet(s) By Mouth Every Evening 08/16/20   [provider]  losartan (COZAAR) 50 MG tablet TAKE 1 TABLET(50 MG) BY MOUTH DAILY 02/05/22   Bensimhon, Shaune Pascal, MD  meloxicam (MOBIC) 7.5 MG tablet Take 1 tablet (7.5 mg total) by mouth daily. 11/15/21   [name], [name], MD  metoprolol succinate (TOPROL-XL) 25 MG 24 hr tablet Take 1.5 tablets (37.5 mg total) by mouth daily. 02/12/22   [name], [name], MD  pantoprazole (PROTONIX) 40 MG tablet TAKE 1 TABLET BY MOUTH 30 TO 60 MINUTES BEFORE YOUR FIRST AND LAST MEALS OF THE DAY 01/11/21   Mc[place]owen, Adrian Blackwater, MD  posaconazole (  NOXAFIL) 100 MG TBEC delayed-release tablet Take 3 tablets (300 mg total) by mouth daily. 07/23/22   Maryjane Hurter, MD  predniSONE (DELTASONE) 5 MG tablet Take 5 mg by mouth daily with breakfast. 01/15/22   [provider]  predniSONE (DELTASONE) 5 MG tablet Take 1 tablet (5 mg total) by mouth daily with breakfast. 06/25/22   Maryjane Hurter, MD  spironolactone (ALDACTONE) 25 MG tablet Take 1 tablet (25 mg total) by mouth daily.  12/27/21   Sueanne Margarita, MD  triamcinolone cream (KENALOG) 0.1 % SMARTSIG:1 Application Topical 2-3 Times Daily 03/26/21   [provider]      Allergies    Wasp venom    Review of Systems   Review of Systems  All other systems reviewed and are negative.   Physical Exam Updated Vital Signs BP 138/76   Pulse 74   Temp 97.7 F (36.5 C) (Oral)   Resp (!) 23   Ht '6\' 2"'$  (1.88 m)   Wt 132.5 kg   SpO2 94%   BMI 37.49 kg/m  Physical Exam Vitals and nursing note reviewed.  Constitutional:      General: He is not in acute distress.    Appearance: Normal appearance. He is well-developed.  HENT:     Head: Normocephalic and atraumatic.  Eyes:     Conjunctiva/sclera: Conjunctivae normal.     Pupils: Pupils are equal, round, and reactive to light.  Cardiovascular:     Rate and Rhythm: Normal rate and regular rhythm.     Heart sounds: Normal heart sounds.  Pulmonary:     Effort: Pulmonary effort is normal. No respiratory distress.     Comments: Mildly decreased breath sounds at bilateral bases Abdominal:     General: There is no distension.     Palpations: Abdomen is soft.     Tenderness: There is no abdominal tenderness.  Musculoskeletal:        General: No deformity. Normal range of motion.     Cervical back: Normal range of motion and neck supple.  Skin:    General: Skin is warm and dry.  Neurological:     General: No focal deficit present.     Mental Status: He is alert and oriented to person, place, and time.     ED Results / Procedures / Treatments   Labs (all labs ordered are listed, but only abnormal results are displayed) Labs Reviewed  CBC - Abnormal; Notable for the following components:      Result Value   RBC 6.81 (*)    MCV 70.9 (*)    MCH 21.6 (*)    RDW 18.8 (*)    All other components within normal limits  BASIC METABOLIC PANEL  TROPONIN I (HIGH SENSITIVITY)    EKG EKG Interpretation  Date/Time:  Saturday August 02 2022 18:32:14  EST Ventricular Rate:  84 PR Interval:  171 QRS Duration: 151 QT Interval:  390 QTC Calculation: 461 R Axis:   -74 Text Interpretation: Sinus rhythm Probable left atrial enlargement Right bundle branch block Baseline wander in lead(s) V2 Confirmed by Dene Gentry [phone]) on 08/02/2022 6:42:37 PM  Radiology No results found.  Procedures Procedures    Medications Ordered in ED Medications - No data to display  ED Course/ Medical Decision Making/ A&P                             Medical Decision Making Amount and/or  Complexity of Data Reviewed Labs: ordered. Radiology: ordered.  Risk OTC drugs. Prescription drug management.    Medical Screen Complete  This patient presented to the ED with complaint of right sided pain.  This complaint involves an extensive number of treatment options. The initial differential diagnosis includes, but is not limited to, pneumonia, pneumothorax, PE, ACS, etc.  This presentation is: Acute, Chronic, Self-Limited, Previously Undiagnosed, Uncertain Prognosis, Complicated, Systemic Symptoms, and Threat to Life/Bodily Function  Patient with well-established history of sarcoidosis with pulmonary and cutaneous involvement presents with right-sided chest discomfort.  This is been ongoing for the last 1 and half to 2 weeks.  Patient's describe symptoms are not consistent with likely ACS.  EKG is without evidence of acute ischemia.  Troponin is barely detectable at 3.  Patient is followed by Dr. Verlee Monte pulmonary.  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  Patient reports significant problems in the last several weeks with affording antifungal medications as prescribed.  CT imaging is without evidence of PE or other significant acute change.  Case discussed briefly with Dr. Patsey Berthold covering pulmonary.  She agrees with plan for close outpatient follow-up with Dr. Verlee Monte on Monday.    Patient is  reassured by ED workup and findings.  Patient understands need for close outpatient follow-up.  Strict return precautions given and understanding  Additional history obtained:   External records from outside sources obtained and reviewed including prior ED visits and prior Inpatient records.    Lab Tests:  I ordered and personally interpreted labs.  The pertinent results include: CBC, BMP, troponin   Imaging Studies ordered:  I ordered imaging studies including chest x-ray, CT angio chest I independently visualized and interpreted obtained imaging which showed no PE, chronic findings  I agree with the radiologist interpretation.   Cardiac Monitoring:  The patient was maintained on a cardiac monitor.  I personally viewed and interpreted the cardiac monitor which showed an underlying rhythm of: NSR   Medicines ordered:  I ordered medication including Acetaminophen for headache  Reevaluation of the patient after these medicines showed that the patient: resolved   Problem List / ED Course:  Atypical chest pain   Reevaluation:  After the interventions noted above, I reevaluated the patient and found that they have: improved Disposition:  After consideration of the diagnostic results and the patients response to treatment, I feel that the patent would benefit from close outpatient followup.          Final Clinical Impression(s) / ED Diagnoses Final diagnoses:  Atypical chest pain    Rx / DC Orders ED Discharge Orders     None         Valarie Merino, MD 08/02/22 2221

## 2022-08-02 NOTE — Discharge Instructions (Addendum)
Return for any problem.   Follow up on Monday with Dr. Verlee Monte (Pulmonary) on Monday as instructed.

## 2022-08-02 NOTE — ED Triage Notes (Signed)
Patient has had centralized chest pain on the right side to his right shoulder for 2 weeks. Feeling more short of breath. No nausea or vomiting. No history of blood clots.

## 2022-08-03 ENCOUNTER — Telehealth: Payer: Self-pay | Admitting: Pulmonary Disease

## 2022-08-03 NOTE — Telephone Encounter (Signed)
Please arrange ER follow up for this patient to be seen this week.    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 Verlee Monte in early January). Seen in ER with 2 weeks chest pain and slight increasing sob/cough. Didn't need to be admitted, but needs clinic f/u this week.    Please call patient with an appointment.  Thank you!     Noe Gens, MSN, APRN, NP-C, AGACNP-BC [facility] 08/03/2022, 7:14 AM   Please see [email or web address] for pager details.   From 7A-7P if no response, please call [phone] After hours, please call ELink [phone]

## 2022-08-04 ENCOUNTER — Telehealth: Payer: Self-pay | Admitting: Student

## 2022-08-04 ENCOUNTER — Other Ambulatory Visit (HOSPITAL_COMMUNITY): Payer: Self-pay

## 2022-08-04 NOTE — Telephone Encounter (Signed)
Voriconazole is not covered under the patients plan at this time.

## 2022-08-04 NOTE — Telephone Encounter (Signed)
Is there any way to do test claim for 200 mg twice daily, voriconazole 200 mg twice daily?   Thanks!!

## 2022-08-04 NOTE — Telephone Encounter (Signed)
Is there any way to do test claim for 200 mg twice daily, voriconazole 200 mg twice daily?  Thanks!!

## 2022-08-04 NOTE — Telephone Encounter (Signed)
Called patient and he states that he had to go to the ED last week due to his fungal infection causing chest pain. He states that he was given Noxafil in the past for his fungal infection but it is costing him over 3000 for a month supply. He is asking for another alternative please  Please advise sir

## 2022-08-04 NOTE — Telephone Encounter (Signed)
Sir please note message from pharmacy staff  Voriconazole is not covered under the patients plan at this time.

## 2022-08-04 NOTE — Telephone Encounter (Signed)
PT states he called last Friday. I do not see an encounter from last Fri so I asked him again. States they were to call him about medications he needed. No call back was rec'd. He presented to [facility] this weekend. Pls call to advise @ [phone]  Seeks antibiotics. Dcln since he has already been to ER.

## 2022-08-04 NOTE — Telephone Encounter (Signed)
Would encourage him to come in for office visit to discuss sluggishness, chest tightness. Not sure that being off of posaconazole is what's doing that. I'll send message to pharmacy to see if we can estimate what voriconazole or itraconazole would cost as alternatives.

## 2022-08-05 ENCOUNTER — Other Ambulatory Visit (HOSPITAL_COMMUNITY): Payer: Self-pay

## 2022-08-05 NOTE — Progress Notes (Unsigned)
o  Synopsis: Referred for cough by [name], Raymond J, MD  Subjective:   PATIENT ID: Sean Sharp GENDER: male DOB: 09-15-1975, MRN: [id number]  No chief complaint on file.  46yM with sarcoid with cutaneous and pulmonary involvement f/b Dr. Chase Caller and [name] previously on MTX/HCQ (had been off of MTX), HTN, AR, venous insufficiency, OSA not on CPAP, GERD   Seen 9/25 by Roxan Diesel and at that visit doing better after course of augmentin and prednisone, still had some cough though.   Now feels similar to how he felt when he had pneumonia. Has had bloody streaks in phlegm over last couple of days. Phlegm overall decreasing still. Has had CP with cough. No fever, drenching night sweats.   Interval HPI   Started on posaconazole and continued on prednisone 10 mg daily  No phlegm now, no bothersome cough. Down to prednisone 5 mg daily now.  ----------------------------------- Have had issues with finding affordable antifungal  Otherwise pertinent review of systems is negative.  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  Hemoglobin electrophoresis normal 2016.   Chronic renal insufficiency, stage II (mild)    Chronic rhinitis    Cutaneous sarcoidosis    [place] rheum: as of 02/2019->plaquenil.  Cont plaquenil and slowly tapering prednisone as of 06/2019 rheum f/u   Hoarseness 2015   [facility] 04/2014-normal laryngoscopy: prednisone taper and bid nexium recommended.  No signif help so pt got 2nd opinion at [facility]    HTN (hypertension)    Hydrocele    left   LPRD (laryngopharyngeal reflux disease)    Obesity, Class II, BMI 35-39.9    OSA (obstructive sleep apnea) 07/28/2019   SEVERE OSA, mild central sleep apnea, CPAP trial 08/2019 (Dr. Fransico Him)   Peripheral edema    left leg>right leg   Prediabetes 02/2017   A1c 6.3% (right when he was starting to take prednisone prn for treatment of his sarcoidosis).  HbA1c 08/2017=6.3%. A1c 01/2020  6.4%.   Pulmonary arterial hypertension (HCC)    Mild (R heart cath 06/27/19), echo 06/2019 normal.  Wt loss and CPAP recommended.   Sarcoidosis of lung ([facility]) 12/2016   2018, but Dr. Melvyn Novas suspects it was smoldering since 2011.  Skin bx of cutaneous lesion confirmed dx of sarcoidosis 02/2017.  Pt improving fall 2018 with plaquenil and prednisone.  Pt self d/c'd plaquenil.   Vocal cord polyps 2015   surgery [facility]      Family History  Problem Relation Age of Onset   Sarcoidosis Father        ?   Lactose intolerance Father    Other Mother        colon issues-portion of colon removed   Lactose intolerance Mother    Cancer Paternal Grandfather        ?     Past Surgical History:  Procedure Laterality Date   OLECRANON BURSECTOMY Right 2019   Polysomnogram  07/28/2019   Severe OSA w/hypoxia, mild central sleep apnea, CPAP recommended (Dr. Fransico Him)   RIGHT HEART CATH N/A 06/27/2019   Mild PAH. Procedure: RIGHT HEART CATH;  Surgeon: Jolaine Artist, MD;  Location: [facility];  Service: Cardiovascular;  Laterality: N/A;   SKIN BIOPSY  2018   Sarcoidosis of skin (also has pulm involvement).   SKIN GRAFT  2010   on left index finger   TRANSTHORACIC ECHOCARDIOGRAM  06/22/2019   NORMAL   Vocal cord  surgery  2016   [facility]   WISDOM TOOTH EXTRACTION  2005    Social History   Socioeconomic History   Marital status: Married    Spouse name: Not on file   Number of children: 2   Years of education: Not on file   Highest education level: Not on file  Occupational History   Occupation: electrician  Tobacco Use   Smoking status: Former    Packs/day: 0.30    Years: 1.50    Total pack years: 0.45    Types: Cigarettes    Quit date: 06/10/1995    Years since quitting: 27.1   Smokeless tobacco: Never  Substance and Sexual Activity   Alcohol use: Yes    Comment: occasional   Drug use: No   Sexual activity: Not on file  Other Topics Concern   Not on file  Social History  Narrative   Married, 2 children (29 and 57 y/o).   Occupation: Clinical biochemist Editor, commissioning).   Orig from [facility].   No Tobacco.  Rare alcohol.  No drugs.   Enjoys fishing, Environmental health practitioner, wood working, Research officer, trade union.      Social Determinants of Health   Financial Resource Strain: Not on file  Food Insecurity: Not on file  Transportation Needs: Not on file  Physical Activity: Not on file  Stress: Not on file  Social Connections: Not on file  Intimate Partner Violence: Not on file     Allergies  Allergen Reactions   Wasp Venom Swelling     Outpatient Medications Prior to Visit  Medication Sig Dispense Refill   albuterol (PROVENTIL) (2.5 MG/3ML) 0.083% nebulizer solution Take 3 mLs (2.5 mg total) by nebulization every 6 (six) hours as needed for wheezing or shortness of breath. 75 mL 12   albuterol (VENTOLIN HFA) 108 (90 Base) MCG/ACT inhaler Inhale 2 puffs into the lungs every 6 (six) hours as needed for wheezing or shortness of breath. 8 g 2   amLODipine (NORVASC) 10 MG tablet Take 1 tablet (10 mg total) by mouth daily. 90 tablet 1   Azelastine-Fluticasone 137-50 MCG/ACT SUSP Place 2 sprays into both nostrils daily.     benzonatate (TESSALON) 200 MG capsule Take 1 capsule (200 mg total) by mouth 3 (three) times daily as needed for cough. 30 capsule 1   clobetasol ointment (TEMOVATE) 0.05 % Apply topically.     Clocortolone Pivalate (CLODERM) 0.1 % cream Apply 1 application topically 2 (two) times daily as needed (Sarcoidosis/rash).      folic acid (FOLVITE) 1 MG tablet Take 1 tablet (1 mg total) by mouth daily. 30 tablet 5   hydrocortisone butyrate (LUCOID) 0.1 % CREA cream Apply 1 application topically 2 (two) times daily. (Patient taking differently: Apply 1 application  topically 2 (two) times daily. As needed) 60 g 6   hydrOXYzine (ATARAX/VISTARIL) 10 MG tablet TAKE 1 TO 3 TABLETS BY MOUTH EVERY 4 TO 6 HOURS AS NEEDED FOR ITCHING     ipratropium (ATROVENT) 0.03 % nasal  spray Place 2 sprays into both nostrils every 12 (twelve) hours. 30 mL 12   levocetirizine (XYZAL) 5 MG tablet SMARTSIG:1 Tablet(s) By Mouth Every Evening     losartan (COZAAR) 50 MG tablet TAKE 1 TABLET(50 MG) BY MOUTH DAILY 90 tablet 3   meloxicam (MOBIC) 7.5 MG tablet Take 1 tablet (7.5 mg total) by mouth daily. 30 tablet 0   metoprolol succinate (TOPROL-XL) 25 MG 24 hr tablet Take 1.5 tablets (37.5 mg total) by  mouth daily. 45 tablet 3   pantoprazole (PROTONIX) 40 MG tablet TAKE 1 TABLET BY MOUTH 30 TO 60 MINUTES BEFORE YOUR FIRST AND LAST MEALS OF THE DAY 180 tablet 3   posaconazole (NOXAFIL) 100 MG TBEC delayed-release tablet Take 3 tablets (300 mg total) by mouth daily. 90 tablet 2   predniSONE (DELTASONE) 5 MG tablet Take 5 mg by mouth daily with breakfast.     predniSONE (DELTASONE) 5 MG tablet Take 1 tablet (5 mg total) by mouth daily with breakfast. 90 tablet 0   spironolactone (ALDACTONE) 25 MG tablet Take 1 tablet (25 mg total) by mouth daily. 30 tablet 0   triamcinolone cream (KENALOG) 0.1 % SMARTSIG:1 Application Topical 2-3 Times Daily     No facility-administered medications prior to visit.       Objective:   Physical Exam:  General appearance: 47 y.o., male, NAD, conversant  Eyes: anicteric sclerae; PERRL, tracking appropriately HENT: NCAT; MMM Neck: Trachea midline; no lymphadenopathy, no JVD Lungs: CTAB, no crackles, no wheeze, with normal respiratory effort CV: RRR, no murmur  Abdomen: Soft, non-tender; non-distended, BS present  Extremities: No peripheral edema, warm Skin: Normal turgor and texture; no rash Psych: Appropriate affect Neuro: Alert and oriented to person and place, no focal deficit     There were no vitals filed for this visit.      on RA BMI Readings from Last 3 Encounters:  08/02/22 37.49 kg/m  06/25/22 38.42 kg/m  04/30/22 38.52 kg/m   Wt Readings from Last 3 Encounters:  08/02/22 292 lb (132.5 kg)  06/25/22 299 lb 3.2 oz  (135.7 kg)  04/30/22 300 lb (136.1 kg)     CBC    Component Value Date/Time   WBC 7.7 08/02/2022 1846   RBC 6.81 (H) 08/02/2022 1846   HGB 14.7 08/02/2022 1846   HGB 13.4 01/08/2022 0901   HCT 48.3 08/02/2022 1846   HCT 45.4 01/08/2022 0901   PLT 229 08/02/2022 1846   PLT 292 01/08/2022 0901   MCV 70.9 (L) 08/02/2022 1846   MCV 70 (L) 01/08/2022 0901   MCH 21.6 (L) 08/02/2022 1846   MCHC 30.4 08/02/2022 1846   RDW 18.8 (H) 08/02/2022 1846   RDW 18.8 (H) 01/08/2022 0901   LYMPHSABS 0.5 (L) 01/08/2022 0901   MONOABS 0.9 04/02/2021 1654   EOSABS 0.2 01/08/2022 0901   BASOSABS 0.1 01/08/2022 0901    Sputum culture 9/25 OP flora  Chest Imaging: CXR 02/17/22 reviewed by me more extensive peripheral alveolar opacities than prior   HRCT 03/21/22 reviewed by me similar to prior save for development of 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  CTA Chest 08/02/22 similar to prior  Pulmonary Functions Testing Results:    Latest Ref Rng & Units 04/30/2022    1:58 PM 10/01/2021    3:02 PM 07/05/2021    3:00 PM 09/06/2020    2:52 PM 07/24/2020    1:01 PM 04/05/2020    3:44 PM  PFT Results  FVC-Pre L 2.96  2.77  2.96  3.04  3.04  3.30   FVC-Predicted Pre % 50  56  60  61  61  66   FVC-Post L 3.13  2.91  3.06  3.29  3.29  3.38   FVC-Predicted Post % 53  59  62  66  66  68   Pre FEV1/FVC % % 72  63  71  70  70  75   Post FEV1/FCV % % 72  71  72  75  75  77   FEV1-Pre L 2.13  1.75  2.11  2.13  2.13  2.48   FEV1-Predicted Pre % 46  44  53  53  53  62   FEV1-Post L 2.24  2.08  2.19  2.48  2.48  2.60   DLCO uncorrected ml/min/mmHg 23.83  19.66  23.39  24.55  24.55  25.13   DLCO UNC% % 70  58  68  72  72  73   DLCO corrected ml/min/mmHg 23.83  19.66  23.39  24.55  24.55  25.13   DLCO COR %Predicted % 70  58  68  72  72  73   DLVA Predicted % 142  120  130  140  140  121   TLC L 4.81  4.47  4.62  4.72  4.72  4.87   TLC % Predicted % 61  57  59  60  60  62   RV % Predicted % 82  75  76  73  73  45     04/30/22 PFT severe restriction, mildly reduced diffusing capacity. No clinically significant decline in lung function  10/01/21 PFT reviewed with moderately severe obstruction and restrictoin, +BD rsponse, air trapping, moderately reduced diffusgin capacity    Echocardiogram:   TTE 06/13/21:  1. Left ventricular ejection fraction, by estimation, is 65 to 70%. The  left ventricle has normal function. The left ventricle has no regional  wall motion abnormalities. Left ventricular diastolic parameters are  consistent with Grade I diastolic  dysfunction (impaired relaxation).   2. Right ventricular systolic function is normal. The right ventricular  size is normal. Tricuspid regurgitation signal is inadequate for assessing  PA pressure.   3. The mitral valve is grossly normal. No evidence of mitral valve  regurgitation.   4. The aortic valve is tricuspid. Aortic valve regurgitation is not  visualized.   5. The inferior vena cava is normal in size with greater than 50%  respiratory variability, suggesting right atrial pressure of 3 mmHg.      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 Very little smoking history. Offered nav bronch/TBLB/BAL however he worries about cost until he gets insurance 06/09/22 and earliest he'd want to schedule bronch would be 06/12/22. I'd hate to leave this untreated that long however and so we elected to treat empirically. Clinically responding well to posaconazole.  Plan: -continue posaconazole -repeat CT Chest late March -prednisone 5 mg daily  -encouraged to seek appointment with derm for possibly active sarcoid rash -Stop protonix while on posaconazole and practice lifestyle measures for GERD -See you in April or sooner if need  be!     Maryjane Hurter, MD [facility] 08/05/2022 2:16 PM

## 2022-08-05 NOTE — Telephone Encounter (Signed)
Sean Sharp is also not covered, ITRACONAZOLE is the preferred medication.

## 2022-08-05 NOTE — Telephone Encounter (Signed)
Any idea what isavuconazole/cresemba would cost him if voriconazole is not an option?  Thanks for the help!

## 2022-08-07 ENCOUNTER — Ambulatory Visit: Payer: BC Managed Care – PPO | Admitting: Student

## 2022-08-07 ENCOUNTER — Encounter: Payer: Self-pay | Admitting: Student

## 2022-08-07 VITALS — BP 136/84 | HR 66 | Ht 74.0 in | Wt 299.0 lb

## 2022-08-07 DIAGNOSIS — B449 Aspergillosis, unspecified: Secondary | ICD-10-CM

## 2022-08-07 DIAGNOSIS — J189 Pneumonia, unspecified organism: Secondary | ICD-10-CM | POA: Diagnosis not present

## 2022-08-07 DIAGNOSIS — D869 Sarcoidosis, unspecified: Secondary | ICD-10-CM

## 2022-08-07 MED ORDER — PREDNISONE 5 MG PO TABS: 5.0000 mg | ORAL_TABLET | Freq: Every day | ORAL | 6 refills | Status: DC

## 2022-08-07 MED ORDER — ITRACONAZOLE 100 MG PO CAPS: 200.0000 mg | ORAL_CAPSULE | Freq: Two times a day (BID) | ORAL | 5 refills | Status: AC

## 2022-08-07 MED ORDER — ALBUTEROL SULFATE HFA 108 (90 BASE) MCG/ACT IN AERS: 2.0000 | INHALATION_SPRAY | Freq: Four times a day (QID) | RESPIRATORY_TRACT | 6 refills | Status: AC | PRN

## 2022-08-07 MED ORDER — STIOLTO RESPIMAT 2.5-2.5 MCG/ACT IN AERS: 2.0000 | INHALATION_SPRAY | Freq: Every day | RESPIRATORY_TRACT | 0 refills | Status: DC

## 2022-08-07 NOTE — Patient Instructions (Addendum)
-   taper back down to prednisone 5 mg daily, stay on prednisone 5 mg daily till next visit - try stiolto 2 puffs once daily in the morning everyday - prescription for albuterol sent to the pharmacy for rescue inhaler - try this before resorting to nebulizer - prescription for itraconazole 200 mg twice daily sent in - will likely need this for 6-12 months - see you in 3 months or sooner if need be!

## 2022-08-10 NOTE — Telephone Encounter (Signed)
Could we plz F/U with pt. To get this closed plz

## 2022-08-13 NOTE — Telephone Encounter (Signed)
Patient was seen by Dr. Verlee Monte on 02/29 and this issue was addressed. Will close encounter.

## 2022-08-20 ENCOUNTER — Encounter (HOSPITAL_BASED_OUTPATIENT_CLINIC_OR_DEPARTMENT_OTHER): Payer: Self-pay

## 2022-08-20 NOTE — Telephone Encounter (Signed)
Sean Sharp

## 2022-09-01 ENCOUNTER — Ambulatory Visit (HOSPITAL_BASED_OUTPATIENT_CLINIC_OR_DEPARTMENT_OTHER)
Admission: RE | Admit: 2022-09-01 | Discharge: 2022-09-01 | Disposition: A | Discharge status: Home / Self Care | Payer: BC Managed Care – PPO | Source: Ambulatory Visit | Attending: Student | Admitting: Student

## 2022-09-01 ENCOUNTER — Encounter (HOSPITAL_BASED_OUTPATIENT_CLINIC_OR_DEPARTMENT_OTHER): Payer: Self-pay

## 2022-09-01 DIAGNOSIS — R918 Other nonspecific abnormal finding of lung field: Secondary | ICD-10-CM | POA: Insufficient documentation

## 2022-09-10 ENCOUNTER — Other Ambulatory Visit (HOSPITAL_BASED_OUTPATIENT_CLINIC_OR_DEPARTMENT_OTHER): Payer: Self-pay

## 2022-09-10 DIAGNOSIS — I10 Essential (primary) hypertension: Secondary | ICD-10-CM

## 2022-09-10 MED ORDER — AMLODIPINE BESYLATE 10 MG PO TABS: 10.0000 mg | ORAL_TABLET | Freq: Every day | ORAL | 1 refills | Status: DC

## 2022-10-21 ENCOUNTER — Other Ambulatory Visit: Payer: Self-pay | Admitting: Pulmonary Disease

## 2022-10-21 ENCOUNTER — Telehealth: Payer: Self-pay | Admitting: Student

## 2022-10-21 MED ORDER — AZITHROMYCIN 250 MG PO TABS: ORAL_TABLET | ORAL | 0 refills | Status: DC

## 2022-10-21 MED ORDER — PREDNISONE 20 MG PO TABS: 20.0000 mg | ORAL_TABLET | Freq: Every day | ORAL | 0 refills | Status: DC

## 2022-10-21 NOTE — Progress Notes (Signed)
Prescription for azithromycin and prednisone sent to pharmacy for you

## 2022-10-21 NOTE — Telephone Encounter (Signed)
Spoke with the pt and notified of response per Dr Wynona Neat  He verbalized understanding

## 2022-10-21 NOTE — Telephone Encounter (Signed)
Spoke with patient. He complains of productive cough with yellow phlegm, headache and sore throat related to coughing Symptoms present X4 days ago Night time is worse- has to sit up to help with breath and to cough up phlegm Hasn't tried anything for symptoms. Advises he wasn't sure what to take   Pharmacy [facility]   Dr. Val Eagle can you please advise since Dr. Thora Lance is out

## 2022-10-21 NOTE — Telephone Encounter (Signed)
Azithromycin and prednisone sent to pharmacy 

## 2022-10-21 NOTE — Telephone Encounter (Signed)
Patient states having symptoms of cough, mucus and sore throat. Pharmacy is [facility]. Patient phone number is [phone].

## 2022-10-28 ENCOUNTER — Telehealth: Payer: Self-pay | Admitting: Student

## 2022-10-28 NOTE — Telephone Encounter (Signed)
Called and spoke with pt and moved his appt up from 5/29 to 5/22 to have cough addressed sooner. Nothing further needed.

## 2022-10-28 NOTE — Telephone Encounter (Signed)
Patient still having symptoms of cough and mucus. Pharmacy is [facility] Patient phone number is [phone].

## 2022-10-29 ENCOUNTER — Ambulatory Visit (INDEPENDENT_AMBULATORY_CARE_PROVIDER_SITE_OTHER): Payer: BC Managed Care – PPO | Admitting: Student

## 2022-10-29 ENCOUNTER — Encounter: Payer: Self-pay | Admitting: Student

## 2022-10-29 VITALS — BP 140/68 | HR 84 | Temp 98.3°F | Ht 74.0 in | Wt 299.8 lb

## 2022-10-29 DIAGNOSIS — B449 Aspergillosis, unspecified: Secondary | ICD-10-CM | POA: Diagnosis not present

## 2022-10-29 DIAGNOSIS — R051 Acute cough: Secondary | ICD-10-CM | POA: Diagnosis not present

## 2022-10-29 DIAGNOSIS — J471 Bronchiectasis with (acute) exacerbation: Secondary | ICD-10-CM

## 2022-10-29 MED ORDER — STIOLTO RESPIMAT 2.5-2.5 MCG/ACT IN AERS: 2.0000 | INHALATION_SPRAY | Freq: Every day | RESPIRATORY_TRACT | 0 refills | Status: DC

## 2022-10-29 MED ORDER — PREDNISONE 5 MG PO TABS: 5.0000 mg | ORAL_TABLET | Freq: Every day | ORAL | 6 refills | Status: AC

## 2022-10-29 MED ORDER — AMOXICILLIN-POT CLAVULANATE 875-125 MG PO TABS: 1.0000 | ORAL_TABLET | Freq: Two times a day (BID) | ORAL | 0 refills | Status: AC

## 2022-10-29 MED ORDER — AZELASTINE-FLUTICASONE 137-50 MCG/ACT NA SUSP: 2.0000 | Freq: Every day | NASAL | 11 refills | Status: DC

## 2022-10-29 MED ORDER — STIOLTO RESPIMAT 2.5-2.5 MCG/ACT IN AERS: 2.0000 | INHALATION_SPRAY | Freq: Every day | RESPIRATORY_TRACT | 11 refills | Status: DC

## 2022-10-29 NOTE — Progress Notes (Unsigned)
o  Synopsis: Referred for cough by de [place], Buren Kos, MD  Subjective:   PATIENT ID: Sean Sharp GENDER: male DOB: 1976/03/02, MRN: [id number]  Chief Complaint  Patient presents with   Follow-up    Increased cough x 2 wks- prod with yellow sputum.    47yM with sarcoid with cutaneous and pulmonary involvement f/b Dr. Marchelle Gearing and Dr. Dierdre Forth previously on MTX/HCQ (had been off of MTX), HTN, AR, venous insufficiency, OSA not on CPAP, GERD   Seen 9/25 by Rhunette Croft and at that visit doing better after course of augmentin and prednisone, still had some cough though.   Now feels similar to how he felt when he had pneumonia. Has had bloody streaks in phlegm over last couple of days. Phlegm overall decreasing still. Has had CP with cough. No fever, drenching night sweats.   Interval HPI  Increased cough and mucus production, called in 5/14 and given z pack and prednisone taper. Felt better initially right after but still dealing with productive cough. No hemoptysis.   Maintained on itra after last visit  Otherwise pertinent review of systems is negative.  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  Hemoglobin electrophoresis normal 2016.   Chronic renal insufficiency, stage II (mild)    Chronic rhinitis    Cutaneous sarcoidosis    [place] rheum: as of 02/2019->plaquenil.  Cont plaquenil and slowly tapering prednisone as of 06/2019 rheum f/u   Hoarseness 2015   [place] [facility] 04/2014-normal laryngoscopy: prednisone taper and bid nexium recommended.  No signif help so pt got 2nd opinion at [facility]    HTN (hypertension)    Hydrocele    left   LPRD (laryngopharyngeal reflux disease)    Obesity, Class II, BMI 35-39.9    OSA (obstructive sleep apnea) 07/28/2019   SEVERE OSA, mild central sleep apnea, CPAP trial 08/2019 (Dr. Armanda Magic)   Peripheral edema    left leg>right leg   Prediabetes 02/2017   A1c 6.3% (right when he was starting to take prednisone  prn for treatment of his sarcoidosis).  HbA1c 08/2017=6.3%. A1c 01/2020 6.4%.   Pulmonary arterial hypertension (HCC)    Mild (R heart cath 06/27/19), echo 06/2019 normal.  Wt loss and CPAP recommended.   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 Sherene Sires suspects it was smoldering since 2011.  Skin bx of cutaneous lesion confirmed dx of sarcoidosis 02/2017.  Pt improving fall 2018 with plaquenil and prednisone.  Pt self d/c'd plaquenil.   Vocal cord polyps 2015   surgery [facility]      Family History  Problem Relation Age of Onset   Sarcoidosis Father        ?   Lactose intolerance Father    Other Mother        colon issues-portion of colon removed   Lactose intolerance Mother    Cancer Paternal Grandfather        ?     Past Surgical History:  Procedure Laterality Date   OLECRANON BURSECTOMY Right 2019   Polysomnogram  07/28/2019   Severe OSA w/hypoxia, mild central sleep apnea, CPAP recommended (Dr. Armanda Magic)   RIGHT HEART CATH N/A 06/27/2019   Mild PAH. Procedure: RIGHT HEART CATH;  Surgeon: Dolores Patty, MD;  Location: [facility] INVASIVE CV LAB;  Service: Cardiovascular;  Laterality: N/A;   SKIN BIOPSY  2018   Sarcoidosis of skin (also has pulm involvement).   SKIN GRAFT  2010   on left index finger   TRANSTHORACIC ECHOCARDIOGRAM  06/22/2019   NORMAL   Vocal cord surgery  2016   [facility] ENT   WISDOM TOOTH EXTRACTION  2005    Social History   Socioeconomic History   Marital status: Married    Spouse name: Not on file   Number of children: 2   Years of education: Not on file   Highest education level: Not on file  Occupational History   Occupation: electrician  Tobacco Use   Smoking status: Former    Packs/day: 0.30    Years: 1.50    Additional pack years: 0.00    Total pack years: 0.45    Types: Cigarettes    Quit date: 06/10/1995    Years since quitting: 27.4   Smokeless tobacco: Never  Substance and Sexual Activity   Alcohol use: Yes    Comment: occasional    Drug use: No   Sexual activity: Not on file  Other Topics Concern   Not on file  Social History Narrative   Married, 2 children (26 and 60 y/o).   Occupation: Personnel officer Insurance claims handler).   Orig from [facility].   No Tobacco.  Rare alcohol.  No drugs.   Enjoys fishing, Investment banker, corporate, wood working, Optician, dispensing.      Social Determinants of Health   Financial Resource Strain: Not on file  Food Insecurity: Not on file  Transportation Needs: Not on file  Physical Activity: Not on file  Stress: Not on file  Social Connections: Not on file  Intimate Partner Violence: Not on file     Allergies  Allergen Reactions   Wasp Venom Swelling     Outpatient Medications Prior to Visit  Medication Sig Dispense Refill   albuterol (PROVENTIL) (2.5 MG/3ML) 0.083% nebulizer solution Take 3 mLs (2.5 mg total) by nebulization every 6 (six) hours as needed for wheezing or shortness of breath. 75 mL 12   albuterol (VENTOLIN HFA) 108 (90 Base) MCG/ACT inhaler Inhale 2 puffs into the lungs every 6 (six) hours as needed for wheezing or shortness of breath. 8 g 6   amLODipine (NORVASC) 10 MG tablet Take 1 tablet (10 mg total) by mouth daily. 90 tablet 1   benzonatate (TESSALON) 200 MG capsule Take 1 capsule (200 mg total) by mouth 3 (three) times daily as needed for cough. 30 capsule 1   clobetasol ointment (TEMOVATE) 0.05 % Apply topically.     Clocortolone Pivalate (CLODERM) 0.1 % cream Apply 1 application topically 2 (two) times daily as needed (Sarcoidosis/rash).      folic acid (FOLVITE) 1 MG tablet Take 1 tablet (1 mg total) by mouth daily. 30 tablet 5   hydrocortisone butyrate (LUCOID) 0.1 % CREA cream Apply 1 application topically 2 (two) times daily. (Patient taking differently: Apply 1 application  topically 2 (two) times daily. As needed) 60 g 6   hydrOXYzine (ATARAX/VISTARIL) 10 MG tablet TAKE 1 TO 3 TABLETS BY MOUTH EVERY 4 TO 6 HOURS AS NEEDED FOR ITCHING     itraconazole (SPORANOX)  100 MG capsule Take 2 capsules (200 mg total) by mouth 2 (two) times daily. 120 capsule 5   levocetirizine (XYZAL) 5 MG tablet SMARTSIG:1 Tablet(s) By Mouth Every Evening     losartan (COZAAR) 50 MG tablet TAKE 1 TABLET(50 MG) BY MOUTH DAILY 90 tablet 3   meloxicam (MOBIC) 7.5 MG tablet Take 1 tablet (7.5 mg total) by mouth daily. 30 tablet 0   metoprolol succinate (  TOPROL-XL) 25 MG 24 hr tablet Take 1.5 tablets (37.5 mg total) by mouth daily. 45 tablet 3   pantoprazole (PROTONIX) 40 MG tablet TAKE 1 TABLET BY MOUTH 30 TO 60 MINUTES BEFORE YOUR FIRST AND LAST MEALS OF THE DAY 180 tablet 3   predniSONE (DELTASONE) 5 MG tablet Take 1 tablet (5 mg total) by mouth daily with breakfast. 30 tablet 6   spironolactone (ALDACTONE) 25 MG tablet Take 1 tablet (25 mg total) by mouth daily. 30 tablet 0   Tiotropium Bromide-Olodaterol (STIOLTO RESPIMAT) 2.5-2.5 MCG/ACT AERS Inhale 2 puffs into the lungs daily. 4 g 0   triamcinolone cream (KENALOG) 0.1 % SMARTSIG:1 Application Topical 2-3 Times Daily     Azelastine-Fluticasone 137-50 MCG/ACT SUSP Place 2 sprays into both nostrils daily.     ipratropium (ATROVENT) 0.03 % nasal spray Place 2 sprays into both nostrils every 12 (twelve) hours. 30 mL 12   azithromycin (ZITHROMAX Z-PAK) 250 MG tablet Take 2 tablets day 1 and then 1 daily for 4 days 6 each 0   predniSONE (DELTASONE) 20 MG tablet Take 1 tablet (20 mg total) by mouth daily with breakfast. 7 tablet 0   No facility-administered medications prior to visit.       Objective:   Physical Exam:  General appearance: 47 y.o., male, NAD, conversant  Eyes: anicteric sclerae; PERRL, tracking appropriately HENT: NCAT; MMM Neck: Trachea midline; no lymphadenopathy, no JVD Lungs: diminished bl, with normal respiratory effort CV: RRR, no murmur  Abdomen: Soft, non-tender; non-distended, BS present  Extremities: No peripheral edema, warm Skin: Normal turgor and texture; no rash Psych: Appropriate  affect Neuro: Alert and oriented to person and place, no focal deficit     Vitals:   10/29/22 1520  BP: (!) 140/68  Pulse: 84  Temp: 98.3 F (36.8 C)  TempSrc: Oral  SpO2: 97%  Weight: 299 lb 12.8 oz (136 kg)  Height: 6\' 2"  (1.88 m)       97% on RA BMI Readings from Last 3 Encounters:  10/29/22 38.49 kg/m  08/07/22 38.39 kg/m  08/02/22 37.49 kg/m   Wt Readings from Last 3 Encounters:  10/29/22 299 lb 12.8 oz (136 kg)  08/07/22 299 lb (135.6 kg)  08/02/22 292 lb (132.5 kg)     CBC    Component Value Date/Time   WBC 7.7 08/02/2022 1846   RBC 6.81 (H) 08/02/2022 1846   HGB 14.7 08/02/2022 1846   HGB 13.4 01/08/2022 0901   HCT 48.3 08/02/2022 1846   HCT 45.4 01/08/2022 0901   PLT 229 08/02/2022 1846   PLT 292 01/08/2022 0901   MCV 70.9 (L) 08/02/2022 1846   MCV 70 (L) 01/08/2022 0901   MCH 21.6 (L) 08/02/2022 1846   MCHC 30.4 08/02/2022 1846   RDW 18.8 (H) 08/02/2022 1846   RDW 18.8 (H) 01/08/2022 0901   LYMPHSABS 0.5 (L) 01/08/2022 0901   MONOABS 0.9 04/02/2021 1654   EOSABS 0.2 01/08/2022 0901   BASOSABS 0.1 01/08/2022 0901    Sputum culture 9/25 OP flora  Chest Imaging: CXR 02/17/22 reviewed by me more extensive peripheral alveolar opacities than prior   HRCT 03/21/22 reviewed by me similar to prior save for development of 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  CTA Chest 08/02/22 similar to prior  CT Super D 09/01/22 stable erlative to HRCT  Pulmonary Functions Testing Results:    Latest Ref Rng & Units 04/30/2022    1:58 PM 10/01/2021    3:02 PM 07/05/2021    3:00 PM  09/06/2020    2:52 PM 07/24/2020    1:01 PM 04/05/2020    3:44 PM  PFT Results  FVC-Pre L 2.96  2.77  2.96  3.04  3.04  3.30   FVC-Predicted Pre % 50  56  60  61  61  66   FVC-Post L 3.13  2.91  3.06  3.29  3.29  3.38   FVC-Predicted Post % 53  59  62  66  66  68   Pre FEV1/FVC % % 72  63  71  70  70  75   Post FEV1/FCV % % 72  71  72  75  75  77   FEV1-Pre L 2.13  1.75  2.11  2.13  2.13   2.48   FEV1-Predicted Pre % 46  44  53  53  53  62   FEV1-Post L 2.24  2.08  2.19  2.48  2.48  2.60   DLCO uncorrected ml/min/mmHg 23.83  19.66  23.39  24.55  24.55  25.13   DLCO UNC% % 70  58  68  72  72  73   DLCO corrected ml/min/mmHg 23.83  19.66  23.39  24.55  24.55  25.13   DLCO COR %Predicted % 70  58  68  72  72  73   DLVA Predicted % 142  120  130  140  140  121   TLC L 4.81  4.47  4.62  4.72  4.72  4.87   TLC % Predicted % 61  57  59  60  60  62   RV % Predicted % 82  75  76  73  73  45    04/30/22 PFT severe restriction, mildly reduced diffusing capacity. No clinically significant decline in lung function  10/01/21 PFT reviewed with moderately severe obstruction and restrictoin, +BD rsponse, air trapping, moderately reduced diffusgin capacity    Echocardiogram:   TTE 06/13/21:  1. Left ventricular ejection fraction, by estimation, is 65 to 70%. The  left ventricle has normal function. The left ventricle has no regional  wall motion abnormalities. Left ventricular diastolic parameters are  consistent with Grade I diastolic  dysfunction (impaired relaxation).   2. Right ventricular systolic function is normal. The right ventricular  size is normal. Tricuspid regurgitation signal is inadequate for assessing  PA pressure.   3. The mitral valve is grossly normal. No evidence of mitral valve  regurgitation.   4. The aortic valve is tricuspid. Aortic valve regurgitation is not  visualized.   5. The inferior vena cava is normal in size with greater than 50%  respiratory variability, suggesting right atrial pressure of 3 mmHg.      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 Very little smoking history. Clinically responded well to posaconazole though he was only  on it for a month or two. Hasn't had radiographic response.   Plan: - sputum cultures, labs today - 2 week course of augmentin - continue prednisone 5 mg daily - resume stiolto 2 puffs once daily in the morning everyday - flutter valve 10 puffs after stiolto - albuterol as needed - continue itraconazole 200 mg twice daily sent in - will likely need this for 6-12 months - see you in 3 months or sooner if need be!  Omar Person, MD [facility] 10/29/2022 4:25 PM

## 2022-10-29 NOTE — Patient Instructions (Addendum)
-   sputum cultures, labs today - 2 week course of augmentin - continue prednisone 5 mg daily - resume stiolto 2 puffs once daily in the morning everyday - albuterol as needed - continue itraconazole 200 mg twice daily sent in - will likely need this for 6-12 months - see you in 3 months or sooner if need be!

## 2022-10-30 LAB — HEPATIC FUNCTION PANEL
ALT: 17 U/L (ref 0–53)
AST: 21 U/L (ref 0–37)
Albumin: 3.9 g/dL (ref 3.5–5.2)
Alkaline Phosphatase: 54 U/L (ref 39–117)
Bilirubin, Direct: 0.1 mg/dL (ref 0.0–0.3)
Total Bilirubin: 0.4 mg/dL (ref 0.2–1.2)
Total Protein: 7.5 g/dL (ref 6.0–8.3)

## 2022-10-31 LAB — ASPERGILLUS FUMIGATUS IGG: Aspergillus fumigatus IgG: >200 mg/L — ABNORMAL HIGH (ref 0.0–55.9)

## 2022-11-05 ENCOUNTER — Ambulatory Visit: Payer: BC Managed Care – PPO | Admitting: Student

## 2022-12-22 ENCOUNTER — Encounter (HOSPITAL_BASED_OUTPATIENT_CLINIC_OR_DEPARTMENT_OTHER): Payer: Self-pay | Admitting: *Deleted

## 2022-12-22 ENCOUNTER — Telehealth (HOSPITAL_BASED_OUTPATIENT_CLINIC_OR_DEPARTMENT_OTHER): Payer: Self-pay | Admitting: *Deleted

## 2022-12-22 NOTE — Telephone Encounter (Signed)
 LVM to offer colon cancer screening. Mychart message sent

## 2023-01-04 ENCOUNTER — Other Ambulatory Visit: Payer: Self-pay | Admitting: Cardiology

## 2023-01-14 ENCOUNTER — Encounter: Payer: Self-pay | Admitting: Student

## 2023-01-16 ENCOUNTER — Ambulatory Visit
Admission: RE | Admit: 2023-01-16 | Discharge: 2023-01-16 | Disposition: A | Discharge status: Home / Self Care | Payer: BC Managed Care – PPO | Source: Ambulatory Visit | Attending: Student | Admitting: Student

## 2023-01-16 DIAGNOSIS — B449 Aspergillosis, unspecified: Secondary | ICD-10-CM

## 2023-01-21 ENCOUNTER — Telehealth: Payer: Self-pay | Admitting: Internal Medicine

## 2023-01-21 NOTE — Telephone Encounter (Signed)
Patient feels that he is in the early stages of a respiratory infection please call back and advise.

## 2023-01-22 MED ORDER — AZITHROMYCIN 250 MG PO TABS: 250.0000 mg | ORAL_TABLET | Freq: Every day | ORAL | 0 refills | Status: AC

## 2023-01-22 NOTE — Telephone Encounter (Signed)
Spoke with patient and he reports he has had multiple bouts of pna and lung infections, so he is able to recognize the symptoms. He is concerned he may be currently developing an infection. He reports symptoms increasing over the past 2 weeks, worse in past 2-3 days: cough, semi-productive as it clears the chest congestion but he is not able to cough anything up/out; chest congestion; mild wheezing which seems to be related to the congestion. He denies significant wheezing, shob, fever/chills or other symptoms. He has been out of Stiolto for a little while. Advised him to pick this up and restart ASAP. He has not needed his alb neb/HFA.   Patient scheduled to establish with MR next week.   Please advise, thank you!

## 2023-01-22 NOTE — Telephone Encounter (Signed)
Double prednisone dose until better then back to baseline   Zpak

## 2023-01-22 NOTE — Telephone Encounter (Signed)
Spoke with patient and advised. Rx sent to pharmacy. Patient aware to contact office if he is not improving after atbx or if symptoms worsen.

## 2023-02-02 ENCOUNTER — Encounter: Payer: Self-pay | Admitting: Internal Medicine

## 2023-02-02 ENCOUNTER — Ambulatory Visit: Payer: BC Managed Care – PPO | Admitting: Internal Medicine

## 2023-02-02 VITALS — BP 144/82 | HR 62 | Temp 98.2°F | Ht 74.0 in | Wt 303.0 lb

## 2023-02-02 DIAGNOSIS — J209 Acute bronchitis, unspecified: Secondary | ICD-10-CM | POA: Diagnosis not present

## 2023-02-02 DIAGNOSIS — I288 Other diseases of pulmonary vessels: Secondary | ICD-10-CM | POA: Diagnosis not present

## 2023-02-02 DIAGNOSIS — D869 Sarcoidosis, unspecified: Secondary | ICD-10-CM

## 2023-02-02 DIAGNOSIS — B441 Other pulmonary aspergillosis: Secondary | ICD-10-CM

## 2023-02-02 DIAGNOSIS — I2510 Atherosclerotic heart disease of native coronary artery without angina pectoris: Secondary | ICD-10-CM | POA: Diagnosis not present

## 2023-02-02 MED ORDER — AMOXICILLIN-POT CLAVULANATE 875-125 MG PO TABS: 1.0000 | ORAL_TABLET | Freq: Two times a day (BID) | ORAL | 0 refills | Status: DC

## 2023-02-02 NOTE — Addendum Note (Signed)
Addended by: Christen Butter on: 02/02/2023 09:31 AM   Modules accepted: Orders

## 2023-02-02 NOTE — Progress Notes (Addendum)
as Consulting Physician (Otolaryngology) Irena Cords, Enzo Montgomery, MD as Consulting Physician (Allergy and Immunology) Nyoka Cowden, MD as Consulting Physician (Pulmonary Disease) Teryl Lucy, MD as Consulting Physician (Orthopedic Surgery) Sherrie George (Inactive) as Consulting Physician (Rheumatology) Bensimhon, Bevelyn Buckles, MD as Consulting Physician (Cardiology)  This Provider for this visit: Treatment Team:  Attending Provider: Kalman Shan, MD    07/05/2021 -   Chief Complaint  Patient presents with   Follow-up    PFT performed today.  Pt states he has been doing okay since last visit and denies any complaints.    HPI Sean Sharp 47 y.o. -returns for follow-up.  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  He has had pulmonary function test.  His pulmonary function test was stable between October 2021 in March 2022.  However since then there is a 5% decline in DLCO and a 3% decline in FVC.  However he is not feeling it.  He feels stable.  I asked him about his recent respiratory infection and relationship to methotrexate noncompliance.  He says he just ran out and was coincidental.  He is back to his baseline after Rikki Spearing treated him.  He continues on methotrexate prednisone and Plaquenil.  This originally prescribed by Dr. Alben Deeds rheumatologist.  I remember talking to Dr. Dierdre Forth myself and try to get the patient scheduled.  However he says he has not scheduled appointment with him.  He does not know why.  In any event because of progression in the pulmonary function test I did indicate that in correlation with the CT progression that there might be an indication here for additional therapy such as immunosuppressants through Dr. Dierdre Forth or even nintedanib through [place].  He had about the side effects of nintedanib which is not immunosuppressant.  He does not want to do it.  However he is agreed for close follow-up.  He is working on losing weight these are        OV 10/01/2021  Subjective:  Patient ID: Sean Sharp, male , DOB: 1975-10-15 , age 47 y.o. , MRN: [id number] , ADDRESS: [address] [place] [address] PCP No primary care provider on file. Patient Care Team: Quintella Reichert, MD as PCP - Sleep Medicine (Cardiology) Melvenia Beam, MD as Consulting Physician (Otolaryngology) Debroah Loop, DO as  Consulting Physician (Otolaryngology) Irena Cords, Enzo Montgomery, MD as Consulting Physician (Allergy and Immunology) Nyoka Cowden, MD as Consulting Physician (Pulmonary Disease) Teryl Lucy, MD as Consulting Physician (Orthopedic Surgery) Sherrie George (Inactive) as Consulting Physician (Rheumatology) Bensimhon, Bevelyn Buckles, MD as Consulting Physician (Cardiology)  This Provider for this visit: Treatment Team:  Attending Provider: Kalman Shan, MD Type of visit: Telephone/Video Circumstance: COVID-19 national emergency Identification of patient Sean Sharp with 27-Jul-1975 and MRN [id number] - 2 person identifier Risks: Risks, benefits, limitations of telephone visit explained. 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 [facility]. [phone]     10/01/2021 -  No chief complaint on file.  HPI Sean Sharp 47 y.o. -not been taking methotrexate x 4 months now. SAys he saw Dr Dierdre Forth few montsh ago and he is aware that patient not taking mtx.  But maintains pred 5mg  per day. SInce last visit - just  standard protocol without intravenous contrast. High resolution imaging of the lungs, as well as inspiratory and expiratory imaging, was performed.   COMPARISON:  02/28/2020, 07/16/2019, 03/16/2019, 01/05/2017, 04/19/2010   FINDINGS: Cardiovascular: No significant vascular findings. Normal heart size. No pericardial effusion. Enlargement of the main pulmonary artery, measuring up to 3.9 cm in caliber.   Mediastinum/Nodes: Enlarged, coarsely calcified lymph nodes throughout the mediastinum and hila.  Thyroid gland, trachea, and esophagus demonstrate no significant findings.   Lungs/Pleura: Redemonstrated very extensive fine nodularity throughout the lungs, many of which are confluent in consolidations and concentrated along the pleural surfaces in fissures and with an upper lobe predominance. Extensive architectural distortion, particularly of the right upper lobe. No significant air trapping on expiratory phase imaging. 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 No significant change in a septated, cavitary, masslike consolidation of the perihilar right lung (series 10, image 141). There is significantly increased subpleural consolidation of the peripheral left lower lobe, largest cross-section measuring 2.9 x 1.6 cm (series 10, image 211). Increased consolidation posteriorly in the left upper lobe abutting the fissure, largest cross-sectional area measuring 3.5 x 2.2 cm (series 10, image 90). Increased consolidation peripherally in the superior segment right lower lobe, greatest cross-sectional area measuring 3.4 x 2.2 cm (series 10, image 171). No pleural effusion or pneumothorax.   Upper Abdomen: No acute abnormality.   Musculoskeletal: No chest wall mass or suspicious bone lesions identified.   IMPRESSION: 1. Redemonstrated very extensive fine nodularity throughout the lungs, many confluent in consolidations and concentrated along the pleural surfaces and fissures and with an upper lobe predominance. Architectural distortion, particularly of the right upper lobe. No significant change in a septated, cavitary, masslike consolidation of the perihilar right lung. Multiple areas of increased consolidation in the bilateral lungs. 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 2. Enlarged, coarsely calcified lymph nodes throughout the mediastinum and hila, consistent with nodal sarcoidosis. 3. Enlargement of the main  pulmonary artery, measuring up to 3.9 cm in caliber, as can be seen in pulmonary hypertension.     Electronically Signed   By: Lauralyn Primes M.D.   On: 02/24/2021 09:28       05/28/2021 Acute OV  Patient presents for an acute office visit.  Patient complains over the last 2 weeks he has had increased cough, congestion thick mucus and shortness of breath.  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  Unfortunately he lost his methotrexate 2 weeks ago.  He is on prednisone currently at 10 mg.  Patient says his breathing has not been doing as well.  Patient does has underlying sleep apnea.  Has not been wearing his CPAP.  Once again we reviewed the dangers of untreated sleep apnea.  Patient also has been using some CBD oil under his tongue which she says occasionally does help his breathing.  Patient denies any fever, chest pain, orthopnea, edema. Patient continues to follow with rheumatology and dermatology. Patient says that his skin lesion were improving but over the last 2 weeks they have been flaring again.  OV 07/05/2021  Subjective:  Patient ID: Sean Sharp, male , DOB: April 14, 1976 , age 77 y.o. , MRN: [id number] , ADDRESS: [address] [place] [address] PCP McGowen, Maryjean Morn, MD Patient Care Team: Jeoffrey Massed, MD as PCP - General (Family Medicine) Quintella Reichert, MD as PCP - Sleep Medicine (Cardiology) Melvenia Beam, MD as Consulting Physician (Otolaryngology) Debroah Loop, DO  See your eye doctor yearly    televist 09/06/18 History of Present Illness: 09/06/2018  f/u ov/Wert re: sarcoidosis / skin involvement/rheumatology  rec plaqueninl 200 x  2 rec prednisone 5 mg  Per Jonell Cluck x 20 mg x 4 days   Dyspnea:  Not limited by breathing from desired activities  / work out fine Cough: none on dymista /pantoprazole Sleeping: sleep flat  SABA use: none 02: none  rec Since your cough is better on dysmista and protonix and does not flare when you have perceive the sarcoid is getting worse it is unlikely related to your sarcoidosis so continue the dysmista/protonix  Ok to use protonix Take 30-60 min before first meal of the day but at onset of any cough you need to remember to Take 30- 60 min before your first and last meals of the day until cough is gone again I refilled your protonix today for the next 6 months but after that you will need to return here or see your PCP for this (as it's not really a pulmonary issue)  Take prednisone and plaquenil at your rheumatologists direction - the goal for plaquenil is to eventually wean you off of all prednisone.  Once the carona virus restrictions are listed we need to see you back here for PFT's/ ov so let's set this up for 6 months from now     12/08/2018  f/u ov/Wert re:  Sarcoidosis on plaquenil 200 x 2, no pred x months f/u by rheum Chief Complaint  Patient presents with   Follow-up    No co's today   Dyspnea:  Good ex tol Cough: betterp dymista  Sleeping: ok SABA use: none  02: none    No obvious day to day or daytime variability or assoc excess/ purulent sputum or mucus plugs or hemoptysis or cp or chest tightness, subjective wheeze or overt sinus or hb symptoms.   Sleeping  without nocturnal  or early am exacerbation  of respiratory  c/o's or need for noct saba. Also denies any obvious fluctuation of symptoms with weather or environmental changes or other aggravating or alleviating factors except as outlined above   No unusual exposure hx or h/o childhood pna/ asthma or knowledge of premature birth.            OV 02/21/2020 -transfer of care from Dr. Sandrea Hughs to Dr.  Marchelle Gearing.  History is provided by the patient and review of the chart.  Subjective:  Patient ID: Sean Sharp, male , DOB: 05/14/1976 , age 44 y.o. , MRN: [id number] , ADDRESS: [address] [place] [address]   02/21/2020 -   Chief Complaint  Patient presents with   Follow-up    no problems     HPI Malikk Simrell 47 y.o. 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  Initially seen by Dr. Shan Levans and then subsequently by Dr. Sandrea Hughs.  He says early on he was only treated with as needed prednisone with each course lasting approximately 1 month.  He does not remember being on chronic prednisone.  He has never been on immunomodulators.  He is always had a chronic cough which is a piece together was deemed as secondary to sinus issues also irritable larynx syndrome per review of the chart.  It appears a few years ago a chest x-ray was done and it showed significant pulmonary sarcoidosis.  At this point  as Consulting Physician (Otolaryngology) Irena Cords, Enzo Montgomery, MD as Consulting Physician (Allergy and Immunology) Nyoka Cowden, MD as Consulting Physician (Pulmonary Disease) Teryl Lucy, MD as Consulting Physician (Orthopedic Surgery) Sherrie George (Inactive) as Consulting Physician (Rheumatology) Bensimhon, Bevelyn Buckles, MD as Consulting Physician (Cardiology)  This Provider for this visit: Treatment Team:  Attending Provider: Kalman Shan, MD    07/05/2021 -   Chief Complaint  Patient presents with   Follow-up    PFT performed today.  Pt states he has been doing okay since last visit and denies any complaints.    HPI Sean Sharp 47 y.o. -returns for follow-up.  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  He has had pulmonary function test.  His pulmonary function test was stable between October 2021 in March 2022.  However since then there is a 5% decline in DLCO and a 3% decline in FVC.  However he is not feeling it.  He feels stable.  I asked him about his recent respiratory infection and relationship to methotrexate noncompliance.  He says he just ran out and was coincidental.  He is back to his baseline after Rikki Spearing treated him.  He continues on methotrexate prednisone and Plaquenil.  This originally prescribed by Dr. Alben Deeds rheumatologist.  I remember talking to Dr. Dierdre Forth myself and try to get the patient scheduled.  However he says he has not scheduled appointment with him.  He does not know why.  In any event because of progression in the pulmonary function test I did indicate that in correlation with the CT progression that there might be an indication here for additional therapy such as immunosuppressants through Dr. Dierdre Forth or even nintedanib through [place].  He had about the side effects of nintedanib which is not immunosuppressant.  He does not want to do it.  However he is agreed for close follow-up.  He is working on losing weight these are        OV 10/01/2021  Subjective:  Patient ID: Sean Sharp, male , DOB: 1975-10-15 , age 47 y.o. , MRN: [id number] , ADDRESS: [address] [place] [address] PCP No primary care provider on file. Patient Care Team: Quintella Reichert, MD as PCP - Sleep Medicine (Cardiology) Melvenia Beam, MD as Consulting Physician (Otolaryngology) Debroah Loop, DO as  Consulting Physician (Otolaryngology) Irena Cords, Enzo Montgomery, MD as Consulting Physician (Allergy and Immunology) Nyoka Cowden, MD as Consulting Physician (Pulmonary Disease) Teryl Lucy, MD as Consulting Physician (Orthopedic Surgery) Sherrie George (Inactive) as Consulting Physician (Rheumatology) Bensimhon, Bevelyn Buckles, MD as Consulting Physician (Cardiology)  This Provider for this visit: Treatment Team:  Attending Provider: Kalman Shan, MD Type of visit: Telephone/Video Circumstance: COVID-19 national emergency Identification of patient Sean Sharp with 27-Jul-1975 and MRN [id number] - 2 person identifier Risks: Risks, benefits, limitations of telephone visit explained. 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 [facility]. [phone]     10/01/2021 -  No chief complaint on file.  HPI Sean Sharp 47 y.o. -not been taking methotrexate x 4 months now. SAys he saw Dr Dierdre Forth few montsh ago and he is aware that patient not taking mtx.  But maintains pred 5mg  per day. SInce last visit - just  standard protocol without intravenous contrast. High resolution imaging of the lungs, as well as inspiratory and expiratory imaging, was performed.   COMPARISON:  02/28/2020, 07/16/2019, 03/16/2019, 01/05/2017, 04/19/2010   FINDINGS: Cardiovascular: No significant vascular findings. Normal heart size. No pericardial effusion. Enlargement of the main pulmonary artery, measuring up to 3.9 cm in caliber.   Mediastinum/Nodes: Enlarged, coarsely calcified lymph nodes throughout the mediastinum and hila.  Thyroid gland, trachea, and esophagus demonstrate no significant findings.   Lungs/Pleura: Redemonstrated very extensive fine nodularity throughout the lungs, many of which are confluent in consolidations and concentrated along the pleural surfaces in fissures and with an upper lobe predominance. Extensive architectural distortion, particularly of the right upper lobe. No significant air trapping on expiratory phase imaging. 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 No significant change in a septated, cavitary, masslike consolidation of the perihilar right lung (series 10, image 141). There is significantly increased subpleural consolidation of the peripheral left lower lobe, largest cross-section measuring 2.9 x 1.6 cm (series 10, image 211). Increased consolidation posteriorly in the left upper lobe abutting the fissure, largest cross-sectional area measuring 3.5 x 2.2 cm (series 10, image 90). Increased consolidation peripherally in the superior segment right lower lobe, greatest cross-sectional area measuring 3.4 x 2.2 cm (series 10, image 171). No pleural effusion or pneumothorax.   Upper Abdomen: No acute abnormality.   Musculoskeletal: No chest wall mass or suspicious bone lesions identified.   IMPRESSION: 1. Redemonstrated very extensive fine nodularity throughout the lungs, many confluent in consolidations and concentrated along the pleural surfaces and fissures and with an upper lobe predominance. Architectural distortion, particularly of the right upper lobe. No significant change in a septated, cavitary, masslike consolidation of the perihilar right lung. Multiple areas of increased consolidation in the bilateral lungs. 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 2. Enlarged, coarsely calcified lymph nodes throughout the mediastinum and hila, consistent with nodal sarcoidosis. 3. Enlargement of the main  pulmonary artery, measuring up to 3.9 cm in caliber, as can be seen in pulmonary hypertension.     Electronically Signed   By: Lauralyn Primes M.D.   On: 02/24/2021 09:28       05/28/2021 Acute OV  Patient presents for an acute office visit.  Patient complains over the last 2 weeks he has had increased cough, congestion thick mucus and shortness of breath.  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  Unfortunately he lost his methotrexate 2 weeks ago.  He is on prednisone currently at 10 mg.  Patient says his breathing has not been doing as well.  Patient does has underlying sleep apnea.  Has not been wearing his CPAP.  Once again we reviewed the dangers of untreated sleep apnea.  Patient also has been using some CBD oil under his tongue which she says occasionally does help his breathing.  Patient denies any fever, chest pain, orthopnea, edema. Patient continues to follow with rheumatology and dermatology. Patient says that his skin lesion were improving but over the last 2 weeks they have been flaring again.  OV 07/05/2021  Subjective:  Patient ID: Sean Sharp, male , DOB: April 14, 1976 , age 77 y.o. , MRN: [id number] , ADDRESS: [address] [place] [address] PCP McGowen, Maryjean Morn, MD Patient Care Team: Jeoffrey Massed, MD as PCP - General (Family Medicine) Quintella Reichert, MD as PCP - Sleep Medicine (Cardiology) Melvenia Beam, MD as Consulting Physician (Otolaryngology) Debroah Loop, DO  standard protocol without intravenous contrast. High resolution imaging of the lungs, as well as inspiratory and expiratory imaging, was performed.   COMPARISON:  02/28/2020, 07/16/2019, 03/16/2019, 01/05/2017, 04/19/2010   FINDINGS: Cardiovascular: No significant vascular findings. Normal heart size. No pericardial effusion. Enlargement of the main pulmonary artery, measuring up to 3.9 cm in caliber.   Mediastinum/Nodes: Enlarged, coarsely calcified lymph nodes throughout the mediastinum and hila.  Thyroid gland, trachea, and esophagus demonstrate no significant findings.   Lungs/Pleura: Redemonstrated very extensive fine nodularity throughout the lungs, many of which are confluent in consolidations and concentrated along the pleural surfaces in fissures and with an upper lobe predominance. Extensive architectural distortion, particularly of the right upper lobe. No significant air trapping on expiratory phase imaging. 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 No significant change in a septated, cavitary, masslike consolidation of the perihilar right lung (series 10, image 141). There is significantly increased subpleural consolidation of the peripheral left lower lobe, largest cross-section measuring 2.9 x 1.6 cm (series 10, image 211). Increased consolidation posteriorly in the left upper lobe abutting the fissure, largest cross-sectional area measuring 3.5 x 2.2 cm (series 10, image 90). Increased consolidation peripherally in the superior segment right lower lobe, greatest cross-sectional area measuring 3.4 x 2.2 cm (series 10, image 171). No pleural effusion or pneumothorax.   Upper Abdomen: No acute abnormality.   Musculoskeletal: No chest wall mass or suspicious bone lesions identified.   IMPRESSION: 1. Redemonstrated very extensive fine nodularity throughout the lungs, many confluent in consolidations and concentrated along the pleural surfaces and fissures and with an upper lobe predominance. Architectural distortion, particularly of the right upper lobe. No significant change in a septated, cavitary, masslike consolidation of the perihilar right lung. Multiple areas of increased consolidation in the bilateral lungs. 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 2. Enlarged, coarsely calcified lymph nodes throughout the mediastinum and hila, consistent with nodal sarcoidosis. 3. Enlargement of the main  pulmonary artery, measuring up to 3.9 cm in caliber, as can be seen in pulmonary hypertension.     Electronically Signed   By: Lauralyn Primes M.D.   On: 02/24/2021 09:28       05/28/2021 Acute OV  Patient presents for an acute office visit.  Patient complains over the last 2 weeks he has had increased cough, congestion thick mucus and shortness of breath.  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  Unfortunately he lost his methotrexate 2 weeks ago.  He is on prednisone currently at 10 mg.  Patient says his breathing has not been doing as well.  Patient does has underlying sleep apnea.  Has not been wearing his CPAP.  Once again we reviewed the dangers of untreated sleep apnea.  Patient also has been using some CBD oil under his tongue which she says occasionally does help his breathing.  Patient denies any fever, chest pain, orthopnea, edema. Patient continues to follow with rheumatology and dermatology. Patient says that his skin lesion were improving but over the last 2 weeks they have been flaring again.  OV 07/05/2021  Subjective:  Patient ID: Sean Sharp, male , DOB: April 14, 1976 , age 77 y.o. , MRN: [id number] , ADDRESS: [address] [place] [address] PCP McGowen, Maryjean Morn, MD Patient Care Team: Jeoffrey Massed, MD as PCP - General (Family Medicine) Quintella Reichert, MD as PCP - Sleep Medicine (Cardiology) Melvenia Beam, MD as Consulting Physician (Otolaryngology) Debroah Loop, DO  See your eye doctor yearly    televist 09/06/18 History of Present Illness: 09/06/2018  f/u ov/Wert re: sarcoidosis / skin involvement/rheumatology  rec plaqueninl 200 x  2 rec prednisone 5 mg  Per Jonell Cluck x 20 mg x 4 days   Dyspnea:  Not limited by breathing from desired activities  / work out fine Cough: none on dymista /pantoprazole Sleeping: sleep flat  SABA use: none 02: none  rec Since your cough is better on dysmista and protonix and does not flare when you have perceive the sarcoid is getting worse it is unlikely related to your sarcoidosis so continue the dysmista/protonix  Ok to use protonix Take 30-60 min before first meal of the day but at onset of any cough you need to remember to Take 30- 60 min before your first and last meals of the day until cough is gone again I refilled your protonix today for the next 6 months but after that you will need to return here or see your PCP for this (as it's not really a pulmonary issue)  Take prednisone and plaquenil at your rheumatologists direction - the goal for plaquenil is to eventually wean you off of all prednisone.  Once the carona virus restrictions are listed we need to see you back here for PFT's/ ov so let's set this up for 6 months from now     12/08/2018  f/u ov/Wert re:  Sarcoidosis on plaquenil 200 x 2, no pred x months f/u by rheum Chief Complaint  Patient presents with   Follow-up    No co's today   Dyspnea:  Good ex tol Cough: betterp dymista  Sleeping: ok SABA use: none  02: none    No obvious day to day or daytime variability or assoc excess/ purulent sputum or mucus plugs or hemoptysis or cp or chest tightness, subjective wheeze or overt sinus or hb symptoms.   Sleeping  without nocturnal  or early am exacerbation  of respiratory  c/o's or need for noct saba. Also denies any obvious fluctuation of symptoms with weather or environmental changes or other aggravating or alleviating factors except as outlined above   No unusual exposure hx or h/o childhood pna/ asthma or knowledge of premature birth.            OV 02/21/2020 -transfer of care from Dr. Sandrea Hughs to Dr.  Marchelle Gearing.  History is provided by the patient and review of the chart.  Subjective:  Patient ID: Sean Sharp, male , DOB: 05/14/1976 , age 44 y.o. , MRN: [id number] , ADDRESS: [address] [place] [address]   02/21/2020 -   Chief Complaint  Patient presents with   Follow-up    no problems     HPI Malikk Simrell 47 y.o. 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  Initially seen by Dr. Shan Levans and then subsequently by Dr. Sandrea Hughs.  He says early on he was only treated with as needed prednisone with each course lasting approximately 1 month.  He does not remember being on chronic prednisone.  He has never been on immunomodulators.  He is always had a chronic cough which is a piece together was deemed as secondary to sinus issues also irritable larynx syndrome per review of the chart.  It appears a few years ago a chest x-ray was done and it showed significant pulmonary sarcoidosis.  At this point  better than at last visit.   He smoked minimally <1-2 years 5-6 cigarettes/day many years ago. No recent MJ.     Otherwise pertinent review of systems is negative.  JAN 2024   46yM with sarcoid with cutaneous and pulmonary involvement f/b Dr. Marchelle Gearing and Dr. Dierdre Forth previously on MTX/HCQ (had been off of MTX), HTN, AR, venous insufficiency, OSA not on CPAP, GERD   Seen 9/25 by Rhunette Croft and at that visit doing better after course of augmentin and prednisone, still had some cough though.   Now feels similar to how he felt when he had pneumonia. Has had bloody streaks in phlegm over last couple of days. Phlegm overall decreasing still. Has had CP with cough. No fever, drenching night sweats.   Interval HPI   Started on posaconazole and continued on prednisone 10 mg daily  No phlegm now, no bothersome cough. Down to prednisone 5 mg daily now.    FEB 2024 Dr Thora Lance   cutaneous and pulmonary involvement f/b Dr. Marchelle Gearing and Dr. Dierdre Forth previously on MTX/HCQ (had been off of MTX), HTN, AR, venous insufficiency, OSA not on CPAP, GERD   Seen 9/25 by Rhunette Croft and at that visit doing better after course of augmentin and prednisone, still had some cough though.   Now feels similar to how he felt when he had pneumonia. Has had bloody streaks in phlegm over last couple of days. Phlegm overall decreasing still. Has had CP with cough. No fever, drenching night sweats.   Interval HPI  Have had issues  with finding affordable antifungal  Had weird episode last night where he had sudden onset dyspnea after the storm yesterday. He self increased to 25 mg prednisone daily.   Has had 3 weeks of pleuritic R chest pain. Responsive to ibuprofen.   No hemoptysis since last visit  Otherwise pertinent review of systems is negative.   OV May 2024 with Dr Thora Lance  6yM with sarcoid with cutaneous and pulmonary involvement f/b Dr. Marchelle Gearing and Dr. Dierdre Forth previously on MTX/HCQ (had been off of MTX), HTN, AR, venous insufficiency, OSA not on CPAP, GERD   Seen 9/25 by Rhunette Croft and at that visit doing better after course of augmentin and prednisone, still had some cough though.   Now feels similar to how he felt when he had pneumonia. Has had bloody streaks in phlegm over last couple of days. Phlegm overall decreasing still. Has had CP with cough. No fever, drenching night sweats.   Interval HPI  Increased cough and mucus production, called in 5/14 and given z pack and prednisone taper. Felt better initially right after but still dealing with productive cough. No hemoptysis.   Maintained on itra after last visit  Otherwise pertinent review of systems is negative.   OV 02/02/2023  Subjective:  Patient ID: Sean Sharp, male , DOB: 1975-11-04 , age 57 y.o. , MRN: [id number] , ADDRESS: [address] [place] [address] PCP de [place], Buren Kos, MD Patient Care Team: [place], Buren Kos, MD as PCP - General (Family Medicine) Quintella Reichert, MD as PCP - Sleep Medicine (Cardiology) Melvenia Beam, MD as Consulting Physician (Otolaryngology) Debroah Loop, DO as Consulting Physician (Otolaryngology) Irena Cords, Enzo Montgomery, MD as Consulting Physician (Allergy and Immunology) Nyoka Cowden, MD as Consulting Physician (Pulmonary Disease) Teryl Lucy, MD as Consulting Physician (Orthopedic Surgery) Sherrie George (Inactive) as Consulting Physician (Rheumatology) Bensimhon,  Bevelyn Buckles, MD as Consulting Physician (Cardiology)  This Provider for  better than at last visit.   He smoked minimally <1-2 years 5-6 cigarettes/day many years ago. No recent MJ.     Otherwise pertinent review of systems is negative.  JAN 2024   46yM with sarcoid with cutaneous and pulmonary involvement f/b Dr. Marchelle Gearing and Dr. Dierdre Forth previously on MTX/HCQ (had been off of MTX), HTN, AR, venous insufficiency, OSA not on CPAP, GERD   Seen 9/25 by Rhunette Croft and at that visit doing better after course of augmentin and prednisone, still had some cough though.   Now feels similar to how he felt when he had pneumonia. Has had bloody streaks in phlegm over last couple of days. Phlegm overall decreasing still. Has had CP with cough. No fever, drenching night sweats.   Interval HPI   Started on posaconazole and continued on prednisone 10 mg daily  No phlegm now, no bothersome cough. Down to prednisone 5 mg daily now.    FEB 2024 Dr Thora Lance   cutaneous and pulmonary involvement f/b Dr. Marchelle Gearing and Dr. Dierdre Forth previously on MTX/HCQ (had been off of MTX), HTN, AR, venous insufficiency, OSA not on CPAP, GERD   Seen 9/25 by Rhunette Croft and at that visit doing better after course of augmentin and prednisone, still had some cough though.   Now feels similar to how he felt when he had pneumonia. Has had bloody streaks in phlegm over last couple of days. Phlegm overall decreasing still. Has had CP with cough. No fever, drenching night sweats.   Interval HPI  Have had issues  with finding affordable antifungal  Had weird episode last night where he had sudden onset dyspnea after the storm yesterday. He self increased to 25 mg prednisone daily.   Has had 3 weeks of pleuritic R chest pain. Responsive to ibuprofen.   No hemoptysis since last visit  Otherwise pertinent review of systems is negative.   OV May 2024 with Dr Thora Lance  6yM with sarcoid with cutaneous and pulmonary involvement f/b Dr. Marchelle Gearing and Dr. Dierdre Forth previously on MTX/HCQ (had been off of MTX), HTN, AR, venous insufficiency, OSA not on CPAP, GERD   Seen 9/25 by Rhunette Croft and at that visit doing better after course of augmentin and prednisone, still had some cough though.   Now feels similar to how he felt when he had pneumonia. Has had bloody streaks in phlegm over last couple of days. Phlegm overall decreasing still. Has had CP with cough. No fever, drenching night sweats.   Interval HPI  Increased cough and mucus production, called in 5/14 and given z pack and prednisone taper. Felt better initially right after but still dealing with productive cough. No hemoptysis.   Maintained on itra after last visit  Otherwise pertinent review of systems is negative.   OV 02/02/2023  Subjective:  Patient ID: Sean Sharp, male , DOB: 1975-11-04 , age 57 y.o. , MRN: [id number] , ADDRESS: [address] [place] [address] PCP [place], Buren Kos, MD Patient Care Team: [place], Buren Kos, MD as PCP - General (Family Medicine) Quintella Reichert, MD as PCP - Sleep Medicine (Cardiology) Melvenia Beam, MD as Consulting Physician (Otolaryngology) Debroah Loop, DO as Consulting Physician (Otolaryngology) Irena Cords, Enzo Montgomery, MD as Consulting Physician (Allergy and Immunology) Nyoka Cowden, MD as Consulting Physician (Pulmonary Disease) Teryl Lucy, MD as Consulting Physician (Orthopedic Surgery) Sherrie George (Inactive) as Consulting Physician (Rheumatology) Bensimhon,  Bevelyn Buckles, MD as Consulting Physician (Cardiology)  This Provider for  better than at last visit.   He smoked minimally <1-2 years 5-6 cigarettes/day many years ago. No recent MJ.     Otherwise pertinent review of systems is negative.  JAN 2024   46yM with sarcoid with cutaneous and pulmonary involvement f/b Dr. Marchelle Gearing and Dr. Dierdre Forth previously on MTX/HCQ (had been off of MTX), HTN, AR, venous insufficiency, OSA not on CPAP, GERD   Seen 9/25 by Rhunette Croft and at that visit doing better after course of augmentin and prednisone, still had some cough though.   Now feels similar to how he felt when he had pneumonia. Has had bloody streaks in phlegm over last couple of days. Phlegm overall decreasing still. Has had CP with cough. No fever, drenching night sweats.   Interval HPI   Started on posaconazole and continued on prednisone 10 mg daily  No phlegm now, no bothersome cough. Down to prednisone 5 mg daily now.    FEB 2024 Dr Thora Lance   cutaneous and pulmonary involvement f/b Dr. Marchelle Gearing and Dr. Dierdre Forth previously on MTX/HCQ (had been off of MTX), HTN, AR, venous insufficiency, OSA not on CPAP, GERD   Seen 9/25 by Rhunette Croft and at that visit doing better after course of augmentin and prednisone, still had some cough though.   Now feels similar to how he felt when he had pneumonia. Has had bloody streaks in phlegm over last couple of days. Phlegm overall decreasing still. Has had CP with cough. No fever, drenching night sweats.   Interval HPI  Have had issues  with finding affordable antifungal  Had weird episode last night where he had sudden onset dyspnea after the storm yesterday. He self increased to 25 mg prednisone daily.   Has had 3 weeks of pleuritic R chest pain. Responsive to ibuprofen.   No hemoptysis since last visit  Otherwise pertinent review of systems is negative.   OV May 2024 with Dr Thora Lance  6yM with sarcoid with cutaneous and pulmonary involvement f/b Dr. Marchelle Gearing and Dr. Dierdre Forth previously on MTX/HCQ (had been off of MTX), HTN, AR, venous insufficiency, OSA not on CPAP, GERD   Seen 9/25 by Rhunette Croft and at that visit doing better after course of augmentin and prednisone, still had some cough though.   Now feels similar to how he felt when he had pneumonia. Has had bloody streaks in phlegm over last couple of days. Phlegm overall decreasing still. Has had CP with cough. No fever, drenching night sweats.   Interval HPI  Increased cough and mucus production, called in 5/14 and given z pack and prednisone taper. Felt better initially right after but still dealing with productive cough. No hemoptysis.   Maintained on itra after last visit  Otherwise pertinent review of systems is negative.   OV 02/02/2023  Subjective:  Patient ID: Sean Sharp, male , DOB: 1975-11-04 , age 57 y.o. , MRN: [id number] , ADDRESS: [address] [place] [address] PCP [place], Buren Kos, MD Patient Care Team: [place], Buren Kos, MD as PCP - General (Family Medicine) Quintella Reichert, MD as PCP - Sleep Medicine (Cardiology) Melvenia Beam, MD as Consulting Physician (Otolaryngology) Debroah Loop, DO as Consulting Physician (Otolaryngology) Irena Cords, Enzo Montgomery, MD as Consulting Physician (Allergy and Immunology) Nyoka Cowden, MD as Consulting Physician (Pulmonary Disease) Teryl Lucy, MD as Consulting Physician (Orthopedic Surgery) Sherrie George (Inactive) as Consulting Physician (Rheumatology) Bensimhon,  Bevelyn Buckles, MD as Consulting Physician (Cardiology)  This Provider for  as Consulting Physician (Otolaryngology) Irena Cords, Enzo Montgomery, MD as Consulting Physician (Allergy and Immunology) Nyoka Cowden, MD as Consulting Physician (Pulmonary Disease) Teryl Lucy, MD as Consulting Physician (Orthopedic Surgery) Sherrie George (Inactive) as Consulting Physician (Rheumatology) Bensimhon, Bevelyn Buckles, MD as Consulting Physician (Cardiology)  This Provider for this visit: Treatment Team:  Attending Provider: Kalman Shan, MD    07/05/2021 -   Chief Complaint  Patient presents with   Follow-up    PFT performed today.  Pt states he has been doing okay since last visit and denies any complaints.    HPI Sean Sharp 47 y.o. -returns for follow-up.  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  He has had pulmonary function test.  His pulmonary function test was stable between October 2021 in March 2022.  However since then there is a 5% decline in DLCO and a 3% decline in FVC.  However he is not feeling it.  He feels stable.  I asked him about his recent respiratory infection and relationship to methotrexate noncompliance.  He says he just ran out and was coincidental.  He is back to his baseline after Rikki Spearing treated him.  He continues on methotrexate prednisone and Plaquenil.  This originally prescribed by Dr. Alben Deeds rheumatologist.  I remember talking to Dr. Dierdre Forth myself and try to get the patient scheduled.  However he says he has not scheduled appointment with him.  He does not know why.  In any event because of progression in the pulmonary function test I did indicate that in correlation with the CT progression that there might be an indication here for additional therapy such as immunosuppressants through Dr. Dierdre Forth or even nintedanib through [place].  He had about the side effects of nintedanib which is not immunosuppressant.  He does not want to do it.  However he is agreed for close follow-up.  He is working on losing weight these are        OV 10/01/2021  Subjective:  Patient ID: Sean Sharp, male , DOB: 1975-10-15 , age 47 y.o. , MRN: [id number] , ADDRESS: [address] [place] [address] PCP No primary care provider on file. Patient Care Team: Quintella Reichert, MD as PCP - Sleep Medicine (Cardiology) Melvenia Beam, MD as Consulting Physician (Otolaryngology) Debroah Loop, DO as  Consulting Physician (Otolaryngology) Irena Cords, Enzo Montgomery, MD as Consulting Physician (Allergy and Immunology) Nyoka Cowden, MD as Consulting Physician (Pulmonary Disease) Teryl Lucy, MD as Consulting Physician (Orthopedic Surgery) Sherrie George (Inactive) as Consulting Physician (Rheumatology) Bensimhon, Bevelyn Buckles, MD as Consulting Physician (Cardiology)  This Provider for this visit: Treatment Team:  Attending Provider: Kalman Shan, MD Type of visit: Telephone/Video Circumstance: COVID-19 national emergency Identification of patient Sean Sharp with 27-Jul-1975 and MRN [id number] - 2 person identifier Risks: Risks, benefits, limitations of telephone visit explained. 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 [facility]. [phone]     10/01/2021 -  No chief complaint on file.  HPI Sean Sharp 47 y.o. -not been taking methotrexate x 4 months now. SAys he saw Dr Dierdre Forth few montsh ago and he is aware that patient not taking mtx.  But maintains pred 5mg  per day. SInce last visit - just  better than at last visit.   He smoked minimally <1-2 years 5-6 cigarettes/day many years ago. No recent MJ.     Otherwise pertinent review of systems is negative.  JAN 2024   46yM with sarcoid with cutaneous and pulmonary involvement f/b Dr. Marchelle Gearing and Dr. Dierdre Forth previously on MTX/HCQ (had been off of MTX), HTN, AR, venous insufficiency, OSA not on CPAP, GERD   Seen 9/25 by Rhunette Croft and at that visit doing better after course of augmentin and prednisone, still had some cough though.   Now feels similar to how he felt when he had pneumonia. Has had bloody streaks in phlegm over last couple of days. Phlegm overall decreasing still. Has had CP with cough. No fever, drenching night sweats.   Interval HPI   Started on posaconazole and continued on prednisone 10 mg daily  No phlegm now, no bothersome cough. Down to prednisone 5 mg daily now.    FEB 2024 Dr Thora Lance   cutaneous and pulmonary involvement f/b Dr. Marchelle Gearing and Dr. Dierdre Forth previously on MTX/HCQ (had been off of MTX), HTN, AR, venous insufficiency, OSA not on CPAP, GERD   Seen 9/25 by Rhunette Croft and at that visit doing better after course of augmentin and prednisone, still had some cough though.   Now feels similar to how he felt when he had pneumonia. Has had bloody streaks in phlegm over last couple of days. Phlegm overall decreasing still. Has had CP with cough. No fever, drenching night sweats.   Interval HPI  Have had issues  with finding affordable antifungal  Had weird episode last night where he had sudden onset dyspnea after the storm yesterday. He self increased to 25 mg prednisone daily.   Has had 3 weeks of pleuritic R chest pain. Responsive to ibuprofen.   No hemoptysis since last visit  Otherwise pertinent review of systems is negative.   OV May 2024 with Dr Thora Lance  6yM with sarcoid with cutaneous and pulmonary involvement f/b Dr. Marchelle Gearing and Dr. Dierdre Forth previously on MTX/HCQ (had been off of MTX), HTN, AR, venous insufficiency, OSA not on CPAP, GERD   Seen 9/25 by Rhunette Croft and at that visit doing better after course of augmentin and prednisone, still had some cough though.   Now feels similar to how he felt when he had pneumonia. Has had bloody streaks in phlegm over last couple of days. Phlegm overall decreasing still. Has had CP with cough. No fever, drenching night sweats.   Interval HPI  Increased cough and mucus production, called in 5/14 and given z pack and prednisone taper. Felt better initially right after but still dealing with productive cough. No hemoptysis.   Maintained on itra after last visit  Otherwise pertinent review of systems is negative.   OV 02/02/2023  Subjective:  Patient ID: Sean Sharp, male , DOB: 1975-11-04 , age 57 y.o. , MRN: [id number] , ADDRESS: [address] [place] [address] PCP [place], Buren Kos, MD Patient Care Team: [place], Buren Kos, MD as PCP - General (Family Medicine) Quintella Reichert, MD as PCP - Sleep Medicine (Cardiology) Melvenia Beam, MD as Consulting Physician (Otolaryngology) Debroah Loop, DO as Consulting Physician (Otolaryngology) Irena Cords, Enzo Montgomery, MD as Consulting Physician (Allergy and Immunology) Nyoka Cowden, MD as Consulting Physician (Pulmonary Disease) Teryl Lucy, MD as Consulting Physician (Orthopedic Surgery) Sherrie George (Inactive) as Consulting Physician (Rheumatology) Bensimhon,  Bevelyn Buckles, MD as Consulting Physician (Cardiology)  This Provider for  as Consulting Physician (Otolaryngology) Irena Cords, Enzo Montgomery, MD as Consulting Physician (Allergy and Immunology) Nyoka Cowden, MD as Consulting Physician (Pulmonary Disease) Teryl Lucy, MD as Consulting Physician (Orthopedic Surgery) Sherrie George (Inactive) as Consulting Physician (Rheumatology) Bensimhon, Bevelyn Buckles, MD as Consulting Physician (Cardiology)  This Provider for this visit: Treatment Team:  Attending Provider: Kalman Shan, MD    07/05/2021 -   Chief Complaint  Patient presents with   Follow-up    PFT performed today.  Pt states he has been doing okay since last visit and denies any complaints.    HPI Sean Sharp 47 y.o. -returns for follow-up.  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  He has had pulmonary function test.  His pulmonary function test was stable between October 2021 in March 2022.  However since then there is a 5% decline in DLCO and a 3% decline in FVC.  However he is not feeling it.  He feels stable.  I asked him about his recent respiratory infection and relationship to methotrexate noncompliance.  He says he just ran out and was coincidental.  He is back to his baseline after Rikki Spearing treated him.  He continues on methotrexate prednisone and Plaquenil.  This originally prescribed by Dr. Alben Deeds rheumatologist.  I remember talking to Dr. Dierdre Forth myself and try to get the patient scheduled.  However he says he has not scheduled appointment with him.  He does not know why.  In any event because of progression in the pulmonary function test I did indicate that in correlation with the CT progression that there might be an indication here for additional therapy such as immunosuppressants through Dr. Dierdre Forth or even nintedanib through [place].  He had about the side effects of nintedanib which is not immunosuppressant.  He does not want to do it.  However he is agreed for close follow-up.  He is working on losing weight these are        OV 10/01/2021  Subjective:  Patient ID: Sean Sharp, male , DOB: 1975-10-15 , age 47 y.o. , MRN: [id number] , ADDRESS: [address] [place] [address] PCP No primary care provider on file. Patient Care Team: Quintella Reichert, MD as PCP - Sleep Medicine (Cardiology) Melvenia Beam, MD as Consulting Physician (Otolaryngology) Debroah Loop, DO as  Consulting Physician (Otolaryngology) Irena Cords, Enzo Montgomery, MD as Consulting Physician (Allergy and Immunology) Nyoka Cowden, MD as Consulting Physician (Pulmonary Disease) Teryl Lucy, MD as Consulting Physician (Orthopedic Surgery) Sherrie George (Inactive) as Consulting Physician (Rheumatology) Bensimhon, Bevelyn Buckles, MD as Consulting Physician (Cardiology)  This Provider for this visit: Treatment Team:  Attending Provider: Kalman Shan, MD Type of visit: Telephone/Video Circumstance: COVID-19 national emergency Identification of patient Sean Sharp with 27-Jul-1975 and MRN [id number] - 2 person identifier Risks: Risks, benefits, limitations of telephone visit explained. 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 [facility]. [phone]     10/01/2021 -  No chief complaint on file.  HPI Sean Sharp 47 y.o. -not been taking methotrexate x 4 months now. SAys he saw Dr Dierdre Forth few montsh ago and he is aware that patient not taking mtx.  But maintains pred 5mg  per day. SInce last visit - just  as Consulting Physician (Otolaryngology) Irena Cords, Enzo Montgomery, MD as Consulting Physician (Allergy and Immunology) Nyoka Cowden, MD as Consulting Physician (Pulmonary Disease) Teryl Lucy, MD as Consulting Physician (Orthopedic Surgery) Sherrie George (Inactive) as Consulting Physician (Rheumatology) Bensimhon, Bevelyn Buckles, MD as Consulting Physician (Cardiology)  This Provider for this visit: Treatment Team:  Attending Provider: Kalman Shan, MD    07/05/2021 -   Chief Complaint  Patient presents with   Follow-up    PFT performed today.  Pt states he has been doing okay since last visit and denies any complaints.    HPI Sean Sharp 47 y.o. -returns for follow-up.  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  He has had pulmonary function test.  His pulmonary function test was stable between October 2021 in March 2022.  However since then there is a 5% decline in DLCO and a 3% decline in FVC.  However he is not feeling it.  He feels stable.  I asked him about his recent respiratory infection and relationship to methotrexate noncompliance.  He says he just ran out and was coincidental.  He is back to his baseline after Rikki Spearing treated him.  He continues on methotrexate prednisone and Plaquenil.  This originally prescribed by Dr. Alben Deeds rheumatologist.  I remember talking to Dr. Dierdre Forth myself and try to get the patient scheduled.  However he says he has not scheduled appointment with him.  He does not know why.  In any event because of progression in the pulmonary function test I did indicate that in correlation with the CT progression that there might be an indication here for additional therapy such as immunosuppressants through Dr. Dierdre Forth or even nintedanib through [place].  He had about the side effects of nintedanib which is not immunosuppressant.  He does not want to do it.  However he is agreed for close follow-up.  He is working on losing weight these are        OV 10/01/2021  Subjective:  Patient ID: Sean Sharp, male , DOB: 1975-10-15 , age 47 y.o. , MRN: [id number] , ADDRESS: [address] [place] [address] PCP No primary care provider on file. Patient Care Team: Quintella Reichert, MD as PCP - Sleep Medicine (Cardiology) Melvenia Beam, MD as Consulting Physician (Otolaryngology) Debroah Loop, DO as  Consulting Physician (Otolaryngology) Irena Cords, Enzo Montgomery, MD as Consulting Physician (Allergy and Immunology) Nyoka Cowden, MD as Consulting Physician (Pulmonary Disease) Teryl Lucy, MD as Consulting Physician (Orthopedic Surgery) Sherrie George (Inactive) as Consulting Physician (Rheumatology) Bensimhon, Bevelyn Buckles, MD as Consulting Physician (Cardiology)  This Provider for this visit: Treatment Team:  Attending Provider: Kalman Shan, MD Type of visit: Telephone/Video Circumstance: COVID-19 national emergency Identification of patient Sean Sharp with 27-Jul-1975 and MRN [id number] - 2 person identifier Risks: Risks, benefits, limitations of telephone visit explained. 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 [facility]. [phone]     10/01/2021 -  No chief complaint on file.  HPI Sean Sharp 47 y.o. -not been taking methotrexate x 4 months now. SAys he saw Dr Dierdre Forth few montsh ago and he is aware that patient not taking mtx.  But maintains pred 5mg  per day. SInce last visit - just  standard protocol without intravenous contrast. High resolution imaging of the lungs, as well as inspiratory and expiratory imaging, was performed.   COMPARISON:  02/28/2020, 07/16/2019, 03/16/2019, 01/05/2017, 04/19/2010   FINDINGS: Cardiovascular: No significant vascular findings. Normal heart size. No pericardial effusion. Enlargement of the main pulmonary artery, measuring up to 3.9 cm in caliber.   Mediastinum/Nodes: Enlarged, coarsely calcified lymph nodes throughout the mediastinum and hila.  Thyroid gland, trachea, and esophagus demonstrate no significant findings.   Lungs/Pleura: Redemonstrated very extensive fine nodularity throughout the lungs, many of which are confluent in consolidations and concentrated along the pleural surfaces in fissures and with an upper lobe predominance. Extensive architectural distortion, particularly of the right upper lobe. No significant air trapping on expiratory phase imaging. 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 No significant change in a septated, cavitary, masslike consolidation of the perihilar right lung (series 10, image 141). There is significantly increased subpleural consolidation of the peripheral left lower lobe, largest cross-section measuring 2.9 x 1.6 cm (series 10, image 211). Increased consolidation posteriorly in the left upper lobe abutting the fissure, largest cross-sectional area measuring 3.5 x 2.2 cm (series 10, image 90). Increased consolidation peripherally in the superior segment right lower lobe, greatest cross-sectional area measuring 3.4 x 2.2 cm (series 10, image 171). No pleural effusion or pneumothorax.   Upper Abdomen: No acute abnormality.   Musculoskeletal: No chest wall mass or suspicious bone lesions identified.   IMPRESSION: 1. Redemonstrated very extensive fine nodularity throughout the lungs, many confluent in consolidations and concentrated along the pleural surfaces and fissures and with an upper lobe predominance. Architectural distortion, particularly of the right upper lobe. No significant change in a septated, cavitary, masslike consolidation of the perihilar right lung. Multiple areas of increased consolidation in the bilateral lungs. 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 2. Enlarged, coarsely calcified lymph nodes throughout the mediastinum and hila, consistent with nodal sarcoidosis. 3. Enlargement of the main  pulmonary artery, measuring up to 3.9 cm in caliber, as can be seen in pulmonary hypertension.     Electronically Signed   By: Lauralyn Primes M.D.   On: 02/24/2021 09:28       05/28/2021 Acute OV  Patient presents for an acute office visit.  Patient complains over the last 2 weeks he has had increased cough, congestion thick mucus and shortness of breath.  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  Unfortunately he lost his methotrexate 2 weeks ago.  He is on prednisone currently at 10 mg.  Patient says his breathing has not been doing as well.  Patient does has underlying sleep apnea.  Has not been wearing his CPAP.  Once again we reviewed the dangers of untreated sleep apnea.  Patient also has been using some CBD oil under his tongue which she says occasionally does help his breathing.  Patient denies any fever, chest pain, orthopnea, edema. Patient continues to follow with rheumatology and dermatology. Patient says that his skin lesion were improving but over the last 2 weeks they have been flaring again.  OV 07/05/2021  Subjective:  Patient ID: Sean Sharp, male , DOB: April 14, 1976 , age 77 y.o. , MRN: [id number] , ADDRESS: [address] [place] [address] PCP McGowen, Maryjean Morn, MD Patient Care Team: Jeoffrey Massed, MD as PCP - General (Family Medicine) Quintella Reichert, MD as PCP - Sleep Medicine (Cardiology) Melvenia Beam, MD as Consulting Physician (Otolaryngology) Debroah Loop, DO

## 2023-02-02 NOTE — Patient Instructions (Addendum)
ICD-10-CM   1. Acute bronchitis, unspecified organism  J20.9     2. 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 Coronary artery calcification seen on CT scan  I25.10     4. Enlarged pulmonary artery (HCC)  I28.8     5. Sarcoidosis  D86.9       Acute bronchitis, unspecified organism  -Using Delsym acute bronchitis today  Plan - Augmentin 875 mg twice daily for 5 days  -Caution diarrhea and abdominal cramps  Pulmonary aspergilloma [facility]) -onset is October 2023 and worse in August 2024 despite itraconazole  Plan - Continue itraconazole for now - Refer infectious diseases - Refer thoracic surgery for possible lobectomy evaluation  Coronary artery calcification seen on CT scan Enlarged pulmonary artery (HCC)  -You have these on the CT scan although there is no current chest pain  Plan - Get echocardiogram - Refer cardiology for possible stress test  Sarcoidosis  -This is ongoing but now complicated by 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 Marchelle Gearing 30-minute visit in 2-3 months but after completing all of the above.

## 2023-02-03 ENCOUNTER — Other Ambulatory Visit: Payer: Self-pay | Admitting: Internal Medicine

## 2023-02-03 DIAGNOSIS — I288 Other diseases of pulmonary vessels: Secondary | ICD-10-CM

## 2023-02-04 ENCOUNTER — Ambulatory Visit (HOSPITAL_COMMUNITY): Payer: BC Managed Care – PPO | Attending: Internal Medicine

## 2023-02-04 DIAGNOSIS — I288 Other diseases of pulmonary vessels: Secondary | ICD-10-CM | POA: Insufficient documentation

## 2023-02-04 DIAGNOSIS — R0609 Other forms of dyspnea: Secondary | ICD-10-CM

## 2023-02-04 LAB — ECHOCARDIOGRAM COMPLETE
Area-P 1/2: 3.45 cm2
S' Lateral: 2.8 cm

## 2023-02-10 ENCOUNTER — Encounter: Payer: Self-pay | Admitting: Infectious Disease

## 2023-02-10 DIAGNOSIS — B449 Aspergillosis, unspecified: Secondary | ICD-10-CM | POA: Insufficient documentation

## 2023-02-10 HISTORY — DX: Aspergillosis, unspecified: B44.9

## 2023-02-10 NOTE — Progress Notes (Signed)
Reason for infectious disease consult: Possible aspergilloma  Requesting physician Sean Cipro, MD    Subjective:    Patient ID: Sean Sharp, male    DOB: 01-Jan-1976, 47 y.o.   MRN: [id number]  HPI  [name] is a 47 year old Black man with hx of both cutaneous and pulmonary sarcoidosis followed closely by [facility].  Note the patient recounted his journey to the diagnosis of sarcoidosis.  He was being followed by pulmonary for many years prior to it being diagnosed and he states that he went for several years without follow-up imaging.  In addition in any case he has developed fairly significant sarcoidosis with some fibrosis.  He has involvement of subcutaneous tissue as well as his nasal cavity.  He has suffered some periodic bouts of bacterial bronchitis.  He was hospitalized at 1 point with Legionella infection complicated by pneumatocele formation.    This fall he was seen with fever chills and some dark red tinge sputum that sounds as if it was blood-streaked though not clear to me that he had overt hemoptysis and was found to have  worsening of masslike cavity which had an area within it that became highly suspicious for a "fungus ball.  His findings were first noted in October 2023.  He was started on posaconazole at that time was continued on that for several months before being switched over to itraconazole to cost.  He apparently was clinically responding to posaconazole and itraconazole in terms of his symptomatology but is imaging of the chest had not improved and in fact recent CT scan enlargement of the cavity and worsening development of debris in this area.  He has now been referred to cardiothoracic surgery for consideration of resection of the aspergilloma.  In terms of his symptoms he has said that when he is had the symptoms of blood streaked sputum and cough and fever that his phlegm tasted differently than it does with typical  bacterial infections.  He has not had recurrence of those symptoms since he has been on antifungal therapy.      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  Hemoglobin electrophoresis normal 2016.   Chronic renal insufficiency, stage II (mild)    Chronic rhinitis    Cutaneous sarcoidosis    [facility] rheum: as of 02/2019->plaquenil.  Cont plaquenil and slowly tapering prednisone as of 06/2019 rheum f/u   Hoarseness 2015   [facility] 04/2014-normal laryngoscopy: prednisone taper and bid nexium recommended.  No signif help so pt got 2nd opinion at [facility]    HTN (hypertension)    Hydrocele    left   LPRD (laryngopharyngeal reflux disease)    Obesity, Class II, BMI 35-39.9    OSA (obstructive sleep apnea) 07/28/2019   SEVERE OSA, mild central sleep apnea, CPAP trial 08/2019 (Dr. Armanda Magic)   Peripheral edema    left leg>right leg   Prediabetes 02/2017   A1c 6.3% (right when he was starting to take prednisone prn for treatment of his sarcoidosis).  HbA1c 08/2017=6.3%. A1c 01/2020 6.4%.   Pulmonary arterial hypertension (HCC)    Mild (R heart cath 06/27/19), echo 06/2019 normal.  Wt loss and CPAP recommended.   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 Sherene Sires suspects it was smoldering since 2011.  Skin bx of cutaneous lesion confirmed dx of sarcoidosis 02/2017.  Pt improving fall 2018 with  plaquenil and prednisone.  Pt self d/c'd plaquenil.   Vocal cord polyps 2015   surgery [facility]     Past Surgical History:  Procedure Laterality Date   OLECRANON BURSECTOMY Right 2019   Polysomnogram  07/28/2019   Severe OSA w/hypoxia, mild central sleep apnea, CPAP recommended (Dr. Armanda Magic)   RIGHT HEART CATH N/A 06/27/2019   Mild PAH. Procedure: RIGHT HEART CATH;  Surgeon: Dolores Patty, MD;  Location: [facility] INVASIVE CV LAB;  Service: Cardiovascular;  Laterality: N/A;   SKIN BIOPSY  2018   Sarcoidosis of skin (also has pulm  involvement).   SKIN GRAFT  2010   on left index finger   TRANSTHORACIC ECHOCARDIOGRAM  06/22/2019   NORMAL   Vocal cord surgery  2016   [facility]   WISDOM TOOTH EXTRACTION  2005    Family History  Problem Relation Age of Onset   Sarcoidosis Father        ?   Lactose intolerance Father    Other Mother        colon issues-portion of colon removed   Lactose intolerance Mother    Cancer Paternal Grandfather        ?      Social History   Socioeconomic History   Marital status: Married    Spouse name: Not on file   Number of children: 2   Years of education: Not on file   Highest education level: Not on file  Occupational History   Occupation: electrician  Tobacco Use   Smoking status: Former    Current packs/day: 0.00    Average packs/day: 0.3 packs/day for 1.5 years (0.4 ttl pk-yrs)    Types: Cigarettes    Start date: 12/09/1993    Quit date: 06/10/1995    Years since quitting: 27.6   Smokeless tobacco: Never  Substance and Sexual Activity   Alcohol use: Yes    Comment: occasional   Drug use: No   Sexual activity: Not on file  Other Topics Concern   Not on file  Social History Narrative   Married, 2 children (77 and 30 y/o).   Occupation: Personnel officer Insurance claims handler).   Orig from [facility].   No Tobacco.  Rare alcohol.  No drugs.   Enjoys fishing, Investment banker, corporate, wood working, Optician, dispensing.      Social Determinants of Health   Financial Resource Strain: Not on file  Food Insecurity: Not on file  Transportation Needs: Not on file  Physical Activity: Not on file  Stress: Not on file  Social Connections: Not on file    Allergies  Allergen Reactions   Wasp Venom Swelling     Current Outpatient Medications:    albuterol (PROVENTIL) (2.5 MG/3ML) 0.083% nebulizer solution, Take 3 mLs (2.5 mg total) by nebulization every 6 (six) hours as needed for wheezing or shortness of breath., Disp: 75 mL, Rfl: 12   albuterol (VENTOLIN HFA) 108 (90 Base)  MCG/ACT inhaler, Inhale 2 puffs into the lungs every 6 (six) hours as needed for wheezing or shortness of breath., Disp: 8 g, Rfl: 6   amLODipine (NORVASC) 10 MG tablet, Take 1 tablet (10 mg total) by mouth daily., Disp: 90 tablet, Rfl: 1   amoxicillin-clavulanate (AUGMENTIN) 875-125 MG tablet, Take 1 tablet by mouth 2 (two) times daily., Disp: 10 tablet, Rfl: 0   Azelastine-Fluticasone 137-50 MCG/ACT SUSP, Place 2 sprays into both nostrils daily., Disp: 23 g, Rfl: 11   benzonatate (TESSALON) 200 MG capsule, Take 1 capsule (  200 mg total) by mouth 3 (three) times daily as needed for cough., Disp: 30 capsule, Rfl: 1   clobetasol ointment (TEMOVATE) 0.05 %, Apply topically., Disp: , Rfl:    Clocortolone Pivalate (CLODERM) 0.1 % cream, Apply 1 application topically 2 (two) times daily as needed (Sarcoidosis/rash). , Disp: , Rfl:    folic acid (FOLVITE) 1 MG tablet, Take 1 tablet (1 mg total) by mouth daily., Disp: 30 tablet, Rfl: 5   hydrocortisone butyrate (LUCOID) 0.1 % CREA cream, Apply 1 application topically 2 (two) times daily. (Patient taking differently: Apply 1 application  topically 2 (two) times daily. As needed), Disp: 60 g, Rfl: 6   hydrOXYzine (ATARAX/VISTARIL) 10 MG tablet, TAKE 1 TO 3 TABLETS BY MOUTH EVERY 4 TO 6 HOURS AS NEEDED FOR ITCHING, Disp: , Rfl:    itraconazole (SPORANOX) 100 MG capsule, Take 2 capsules (200 mg total) by mouth 2 (two) times daily., Disp: 120 capsule, Rfl: 5   levocetirizine (XYZAL) 5 MG tablet, SMARTSIG:1 Tablet(s) By Mouth Every Evening, Disp: , Rfl:    losartan (COZAAR) 50 MG tablet, TAKE 1 TABLET(50 MG) BY MOUTH DAILY, Disp: 90 tablet, Rfl: 3   meloxicam (MOBIC) 7.5 MG tablet, Take 1 tablet (7.5 mg total) by mouth daily., Disp: 30 tablet, Rfl: 0   metoprolol succinate (TOPROL-XL) 25 MG 24 hr tablet, Take 1.5 tablets (37.5 mg total) by mouth daily., Disp: 45 tablet, Rfl: 3   pantoprazole (PROTONIX) 40 MG tablet, TAKE 1 TABLET BY MOUTH 30 TO 60 MINUTES BEFORE  YOUR FIRST AND LAST MEALS OF THE DAY, Disp: 180 tablet, Rfl: 3   predniSONE (DELTASONE) 5 MG tablet, Take 1 tablet (5 mg total) by mouth daily with breakfast., Disp: 30 tablet, Rfl: 6   spironolactone (ALDACTONE) 25 MG tablet, TAKE 1 TABLET(25 MG) BY MOUTH DAILY, Disp: 15 tablet, Rfl: 0   Tiotropium Bromide-Olodaterol (STIOLTO RESPIMAT) 2.5-2.5 MCG/ACT AERS, Inhale 2 puffs into the lungs daily., Disp: 4 g, Rfl: 11   triamcinolone cream (KENALOG) 0.1 %, SMARTSIG:1 Application Topical 2-3 Times Daily, Disp: , Rfl:    Review of Systems  Constitutional:  Negative for activity change, appetite change, chills, diaphoresis, fatigue, fever and unexpected weight change.  HENT:  Negative for congestion, rhinorrhea, sinus pressure, sneezing, sore throat and trouble swallowing.   Eyes:  Negative for photophobia and visual disturbance.  Respiratory:  Positive for cough. Negative for chest tightness, shortness of breath, wheezing and stridor.   Cardiovascular:  Negative for chest pain, palpitations and leg swelling.  Gastrointestinal:  Negative for abdominal distention, abdominal pain, anal bleeding, blood in stool, constipation, diarrhea, nausea and vomiting.  Genitourinary:  Negative for difficulty urinating, dysuria, flank pain and hematuria.  Musculoskeletal:  Negative for arthralgias, back pain, gait problem, joint swelling and myalgias.  Skin:  Negative for color change, pallor, rash and wound.  Neurological:  Negative for dizziness, tremors, weakness and light-headedness.  Hematological:  Negative for adenopathy. Does not bruise/bleed easily.  Psychiatric/Behavioral:  Negative for agitation, behavioral problems, confusion, decreased concentration, dysphoric mood and sleep disturbance.        Objective:   Physical Exam Constitutional:      Appearance: He is well-developed.  HENT:     Head: Normocephalic and atraumatic.  Eyes:     Conjunctiva/sclera: Conjunctivae normal.  Cardiovascular:      Rate and Rhythm: Normal rate and regular rhythm.     Heart sounds: No murmur heard.    No friction rub. No gallop.  Pulmonary:  Effort: Pulmonary effort is normal. No respiratory distress.     Breath sounds: No stridor. No wheezing or rhonchi.  Abdominal:     General: There is no distension.     Palpations: Abdomen is soft.  Musculoskeletal:        General: No tenderness. Normal range of motion.     Cervical back: Normal range of motion and neck supple.  Skin:    General: Skin is warm and dry.     Coloration: Skin is not pale.     Findings: No erythema or rash.  Neurological:     General: No focal deficit present.     Mental Status: He is alert and oriented to person, place, and time.  Psychiatric:        Mood and Affect: Mood normal.        Behavior: Behavior normal.        Thought Content: Thought content normal.        Judgment: Judgment normal.           Assessment & Plan:   Aspergilloma:  Certainly radiographically this makes sense that we have not had a formal microbiological diagnosis he is clinical picture seems consistent with it and he does have positive antibodies to Aspergillus.  The fact that this has worsened despite antifungal therapy is not entirely surprising given the nature of aspergilloma's.  If cure of this is desired then cardiothoracic surgery is the only option for removal of the aspergilloma.  It is not entirely clear to me that his symptoms or his condition merit antifungal treatment.  In talking to him it does not sound like the symptoms he had when he was first diagnosed were of a severity that 1 would need to initiate antifungal therapy.  He is immunosuppressed he is not severely immunocompromised) which would be another indication for treatment.  I am okay however for him being on itraconazole for now since it may be helping him from a symptom standpoint.  We could consider other drugs such as Cresemba with pharmaceutical assistance  but I do not know that we need to go down that pathway at this point.  I would like to see what comes of his appointment with cardiothoracic surgery. I would not think from symptom standpoint that he requires surgery. Perhaps based on size of his aspergilloma?  I would also like to reassess him in a few months time.  Sarcoidosis: Quite well attuned to this diagnosis and the symptoms when it flares and typically has steroid tapers as needed his baseline level of immunosuppression is 5 mg of prednisone   Vaccine counseling recommended updated flu vaccine which he received today.  I have personally spent 84 minutes involved in face-to-face and non-face-to-face activities for this patient on the day of the visit. Professional time spent includes the following activities: Preparing to see the patient (review of tests), Obtaining and/or reviewing separately obtained history (admission/discharge record), Performing a medically appropriate examination and/or evaluation , Ordering medications/tests/procedures, referring and communicating with other health care professionals, Documenting clinical information in the EMR, Independently interpreting results (not separately reported), Communicating results to the patient/family/caregiver, Counseling and educating the patient/family/caregiver and Care coordination (not separately reported).

## 2023-02-11 ENCOUNTER — Other Ambulatory Visit: Payer: Self-pay

## 2023-02-11 ENCOUNTER — Encounter: Payer: Self-pay | Admitting: Infectious Disease

## 2023-02-11 ENCOUNTER — Other Ambulatory Visit (HOSPITAL_COMMUNITY): Payer: Self-pay

## 2023-02-11 ENCOUNTER — Ambulatory Visit: Payer: BC Managed Care – PPO | Admitting: Infectious Disease

## 2023-02-11 VITALS — BP 160/90 | HR 76 | Temp 98.3°F | Ht 74.0 in | Wt 303.0 lb

## 2023-02-11 DIAGNOSIS — Z23 Encounter for immunization: Secondary | ICD-10-CM

## 2023-02-11 DIAGNOSIS — J189 Pneumonia, unspecified organism: Secondary | ICD-10-CM | POA: Diagnosis not present

## 2023-02-11 DIAGNOSIS — Z7185 Encounter for immunization safety counseling: Secondary | ICD-10-CM | POA: Diagnosis not present

## 2023-02-11 DIAGNOSIS — D869 Sarcoidosis, unspecified: Secondary | ICD-10-CM

## 2023-02-11 DIAGNOSIS — B449 Aspergillosis, unspecified: Secondary | ICD-10-CM

## 2023-02-19 ENCOUNTER — Telehealth: Payer: Self-pay | Admitting: Internal Medicine

## 2023-02-19 DIAGNOSIS — J471 Bronchiectasis with (acute) exacerbation: Secondary | ICD-10-CM

## 2023-02-19 DIAGNOSIS — B441 Other pulmonary aspergillosis: Secondary | ICD-10-CM

## 2023-02-19 NOTE — Telephone Encounter (Signed)
Finished round of Antibx. States still sick. Better, but still has a small amount of plegm. Please call  @ [phone]

## 2023-02-20 NOTE — Telephone Encounter (Signed)
Called and spoke with pt who states he finished the abx about 2 weeks ago and states that he is still coughing up a small amount of phlegm. Pt said that he is feeling better but since he is still coughing and wheezing, he is wanting to know if something else might need to be prescribed.  Pt is not sure if he is running a fever. States that he does have bouts of where he does feel hot but has not actually checked his temp.  With pt still coughing and wheezing, pt wants to know if more meds might need to be prescribed or what could be recommended.  Dr. Marchelle Gearing, please advise.

## 2023-02-23 ENCOUNTER — Other Ambulatory Visit (INDEPENDENT_AMBULATORY_CARE_PROVIDER_SITE_OTHER): Payer: BC Managed Care – PPO

## 2023-02-23 ENCOUNTER — Other Ambulatory Visit: Payer: Self-pay | Admitting: *Deleted

## 2023-02-23 DIAGNOSIS — B441 Other pulmonary aspergillosis: Secondary | ICD-10-CM

## 2023-02-23 DIAGNOSIS — J471 Bronchiectasis with (acute) exacerbation: Secondary | ICD-10-CM | POA: Diagnosis not present

## 2023-02-23 DIAGNOSIS — D869 Sarcoidosis, unspecified: Secondary | ICD-10-CM

## 2023-02-23 LAB — CBC WITH DIFFERENTIAL/PLATELET
Basophils Absolute: 0 10*3/uL (ref 0.0–0.1)
Basophils Relative: 0.4 % (ref 0.0–3.0)
Eosinophils Absolute: 0.1 10*3/uL (ref 0.0–0.7)
Eosinophils Relative: 2.2 % (ref 0.0–5.0)
HCT: 44.2 % (ref 39.0–52.0)
Hemoglobin: 13.7 g/dL (ref 13.0–17.0)
Lymphocytes Relative: 6.2 % — ABNORMAL LOW (ref 12.0–46.0)
Lymphs Abs: 0.4 10*3/uL — ABNORMAL LOW (ref 0.7–4.0)
MCHC: 31.1 g/dL (ref 30.0–36.0)
MCV: 70.1 fl — ABNORMAL LOW (ref 78.0–100.0)
Monocytes Absolute: 0.7 10*3/uL (ref 0.1–1.0)
Monocytes Relative: 9.8 % (ref 3.0–12.0)
Neutro Abs: 5.4 10*3/uL (ref 1.4–7.7)
Neutrophils Relative %: 81.4 % — ABNORMAL HIGH (ref 43.0–77.0)
Platelets: 225 10*3/uL (ref 150.0–400.0)
RBC: 6.3 Mil/uL — ABNORMAL HIGH (ref 4.22–5.81)
RDW: 17.9 % — ABNORMAL HIGH (ref 11.5–15.5)
WBC: 6.7 10*3/uL (ref 4.0–10.5)

## 2023-02-23 MED ORDER — PREDNISONE 10 MG PO TABS: ORAL_TABLET | ORAL | 0 refills | Status: DC

## 2023-02-23 NOTE — Telephone Encounter (Signed)
I called and spoke with the pt and notified of response per MR  He verbalized understanding  I have placed lab orders and sent pred to pharm  He will call if not improving

## 2023-02-23 NOTE — Telephone Encounter (Signed)
Dr. Marchelle Gearing, the referral was sent to Dr. Cliffton Asters initially.  He felt he needed to be referred to [facility] due to the complexity of his case.  I have placed that referral to [facility].  I called and spoke with the patient to provide the recommendations provided, however, he states he does not have an involuntary cough.  He has thin mucous that he cannot cough up and has a voluntary cough.  His chest and head are congested.  He complains of a head from time to time.  He is not blowing any mucous out of his nose.  He denies any fever, chills or body aches.  I asked if he had taken a recent Covid test and he stated no.  I advised that he take a home Covid test so we can rule that out as we treat Covid differently.  He states he will go get a Covid test and take it.  I let him know I would update Dr. Marchelle Gearing on his symptoms and once I hear back from him I will call him back and that should give him time to take the Covid test.  Dr. Marchelle Gearing, any recommendations if the Covid test is negative?  Please advise.  Thank you.

## 2023-02-23 NOTE — Telephone Encounter (Signed)
Patient is calling back. He says someone reached out Friday and he is returning the call. He still has the same cough. [phone]

## 2023-02-23 NOTE — Telephone Encounter (Signed)
I like him to come and check a blood cbc with diff and blood IgE - markers of inflammation. Thewere were normal > 1 year ago. And IgE last checked several years ago. If these are abnormal we can look at biologic.   AFter the blood test - can do a week of steroids - Take prednisone 40 mg daily x 2 days, then 20mg  daily x 2 days, then 10mg  daily x 2 days, then 5mg  daily to continue  If steroid not working can look at other measures such as gabapentin or cougy syrup at followup

## 2023-02-23 NOTE — Telephone Encounter (Signed)
I do not see where the referral for thoracic surgery has happened -not sure if this referral was not placed or if they thought he was too complicated and did not want to see him?.  I may be referring him to [facility].  Please check with him.  If there is been no movement then a referral needs to be made  All we can do for the cough at this point in time is maybe try another course of prednisone to go down any inflammation.  - Please take Take prednisone 40mg  once daily x 3 days, then 30mg  once daily x 3 days, then 20mg  once daily x 3 days, then prednisone 10mg  once daily  x 3 days and then go back to baseline maintenance dose

## 2023-02-24 ENCOUNTER — Telehealth: Payer: Self-pay | Admitting: Internal Medicine

## 2023-02-24 ENCOUNTER — Telehealth: Payer: Self-pay | Admitting: Pharmacist

## 2023-02-24 LAB — IGE: IgE (Immunoglobulin E), Serum: 366 kU/L — ABNORMAL HIGH (ref <=114)

## 2023-02-24 NOTE — Telephone Encounter (Signed)
Based on baseline IgE  of 366 and baseline BW of 137.4kg, he is outside of FDA-approved and studied dosing. Max dosing for this IgE is 300mg  SQ every 2 weeks  Pt eligible for savings card once approved.  Spoke with patient regarding labs and what they may suggest. He is open to trying Xolair and is aware of Epipen requirement and that first 3 doses are completed in clinic.   Patient took COVID at-home test yesterday which tested negative. He wanted MR to just be aware  Chesley Mires, PharmD, MPH, BCPS, CPP Clinical Pharmacist (Rheumatology and Pulmonology)

## 2023-02-24 NOTE — Progress Notes (Signed)
Blood eosinophil normal and do not qualify for anti-eosinophil biologic. Await IgE results

## 2023-02-24 NOTE — Telephone Encounter (Signed)
Patient will be new start to Xolair: Based on baseline IgE  of 366 and baseline BW of 137.4kg, he is outside of FDA-approved and studied dosing. Max dosing for this IgE is 300mg  SQ every 2 weeks  Pt eligible for savings card once approved.  Will need Epipen and for first three doses to be completed in clinic (first with 2 hour monitoring,  2nd/3rd doses with 30 min monitoring)  Chesley Mires, PharmD, MPH, BCPS, CPP Clinical Pharmacist (Rheumatology and Pulmonology)

## 2023-02-24 NOTE — Telephone Encounter (Signed)
Blood IgE is high  Plan  - he appears to have component of allergic asthma - start evaluation for XOLAIR to control his symptoms   Sending to triage and pharmacy tea   Latest Reference Range & Units 03/03/22 15:26 02/23/23 16:36  Source  SPUTUM   STATUS:  FINAL   IgE (Immunoglobulin E), Serum <OR=114 kU/L  366 (H)  (H): Data is abnormally high

## 2023-02-26 NOTE — Telephone Encounter (Signed)
Submitted a Prior Authorization request to [facility] for XOLAIR via CoverMyMeds. Will update once we receive a response.  Key: [id number]

## 2023-02-27 NOTE — Telephone Encounter (Signed)
Received a fax regarding Prior Authorization from [facility] for XOLAIR. Authorization has been DENIED because patient must be taking medium-to-high dose ICS with additional controller drug (LABA, LAMA, leukotriene modifier, theophylline).  Patient is only taking Stiolto with no evident trial/failure or contraindication for ICS. Will await advisement from Dr. Marchelle Gearing.  DENIAL LETTER scanned to media tab of chart.  Chesley Mires, PharmD, MPH, BCPS, CPP Clinical Pharmacist (Rheumatology and Pulmonology)

## 2023-02-27 NOTE — Telephone Encounter (Signed)
Received fax from [facility] requesting chart notes indicating that patiet is taking ICS for his allergic asthma. Patient is currently taking Stiolto  Do not need any trial/failure of ICS with adverse reactions or intolerance per chart review  Chesley Mires, PharmD, MPH, BCPS, CPP Clinical Pharmacist (Rheumatology and Pulmonology)

## 2023-03-02 MED ORDER — LEVOCETIRIZINE DIHYDROCHLORIDE 5 MG PO TABS: 2.5000 mg | ORAL_TABLET | Freq: Every evening | ORAL | 1 refills | Status: DC

## 2023-03-02 NOTE — Telephone Encounter (Signed)
     Ok please have him start a ICS - any that his insurance will allow and then we can reassess in a month or two

## 2023-03-02 NOTE — Telephone Encounter (Signed)
Patient would like alternative to Xolair. Patient phone number is [phone].

## 2023-03-02 NOTE — Telephone Encounter (Signed)
I spoke with patient has been advised that he would have to fail ICS to qualify for Xolair for his allergic asthma dx.  Patient states symptoms are tame since last speaking with Dr. Marchelle Gearing. 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 He is interested in taking levocetirizine more consistently before starting Xolair or any new inhaler. I did discuss that at some point will need to trial ICS to qualify for any more aggressive asthma which he can choose to start now or at a later point. I did discuss that ICS inhaler would have to be tried for several months before escalating treatment to biologic. He verbalized understanding  He states he has been tested for allergies in past but this may have been many years ago at this point.  + bee venom -  cedar (sensitivity unknown)  F/u w Dr. Marchelle Gearing scheduled for 03/30/2023  Chesley Mires, PharmD, MPH, BCPS, CPP Clinical Pharmacist (Rheumatology and Pulmonology)

## 2023-03-03 ENCOUNTER — Other Ambulatory Visit (HOSPITAL_COMMUNITY): Payer: Self-pay

## 2023-03-03 NOTE — Telephone Encounter (Signed)
Per test claims these are the covered ICS/LABA:  Wixela/Advair Diksus-$5.00 Symbicort-$5.00 Dulera-$65.76 Breo-$30.00

## 2023-03-20 ENCOUNTER — Other Ambulatory Visit: Payer: Self-pay | Admitting: Internal Medicine

## 2023-03-30 ENCOUNTER — Ambulatory Visit: Payer: BC Managed Care – PPO | Admitting: Internal Medicine

## 2023-03-30 ENCOUNTER — Encounter: Payer: Self-pay | Admitting: Internal Medicine

## 2023-03-30 ENCOUNTER — Other Ambulatory Visit (HOSPITAL_BASED_OUTPATIENT_CLINIC_OR_DEPARTMENT_OTHER): Payer: Self-pay | Admitting: *Deleted

## 2023-03-30 ENCOUNTER — Other Ambulatory Visit: Payer: Self-pay | Admitting: *Deleted

## 2023-03-30 VITALS — BP 165/76 | HR 96 | Temp 97.8°F | Ht 74.0 in | Wt 305.8 lb

## 2023-03-30 DIAGNOSIS — J471 Bronchiectasis with (acute) exacerbation: Secondary | ICD-10-CM

## 2023-03-30 DIAGNOSIS — R768 Other specified abnormal immunological findings in serum: Secondary | ICD-10-CM

## 2023-03-30 DIAGNOSIS — R053 Chronic cough: Secondary | ICD-10-CM

## 2023-03-30 DIAGNOSIS — I2510 Atherosclerotic heart disease of native coronary artery without angina pectoris: Secondary | ICD-10-CM | POA: Diagnosis not present

## 2023-03-30 DIAGNOSIS — D869 Sarcoidosis, unspecified: Secondary | ICD-10-CM

## 2023-03-30 DIAGNOSIS — B441 Other pulmonary aspergillosis: Secondary | ICD-10-CM | POA: Diagnosis not present

## 2023-03-30 DIAGNOSIS — I10 Essential (primary) hypertension: Secondary | ICD-10-CM

## 2023-03-30 DIAGNOSIS — B449 Aspergillosis, unspecified: Secondary | ICD-10-CM

## 2023-03-30 MED ORDER — AMLODIPINE BESYLATE 10 MG PO TABS: 10.0000 mg | ORAL_TABLET | Freq: Every day | ORAL | 1 refills | Status: DC

## 2023-03-30 MED ORDER — TRELEGY ELLIPTA 100-62.5-25 MCG/ACT IN AEPB: 1.0000 | INHALATION_SPRAY | Freq: Every day | RESPIRATORY_TRACT | Status: DC

## 2023-03-30 NOTE — Progress Notes (Addendum)
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 smoking   1997 s sequelae 2006  With exp to foam bad cough resolved w/in 3 days of avoidance and did fine until 2011 really bad cold > chronic cough eval by Dr Delford Field 04/2010 with suspicion for sarcoid (pos in father) but CT with only nonspecific adenopathy  waxes and wanes since then with extensive w/u / rx at [facility] center (reviewed in care everywhere)  And much  worse x 09/2016 assoc with hb/ worse with certain foods and some better dymista self referred to pulmonary clinic 12/11/2016    History of Present Illness  12/11/2016 1st [facility] office visit/ W[facility]   Chief Complaint  Patient presents with   Pulmonary Consult    Self referral. Pt c/o increased cough and SOB over the past 3 months. He states his cough is not really productive. It never bothers him at night. He sometimes coughs until the point he feels lightheaded, and has "passed out" once before due to cough.    cough x 6 years  Citric drinks make it worse  Allergy eval [facility] cedar allergy  nexium as walking the door    rec Protonix (pantoprazole) 40 mg  Take 30- 60 min before your first and last meals of the day  Continue dymista one twice daily - point toward ear on same side For drainage / throat tickle as need  >>>   take CHLORPHENIRAMINE  4 mg - take one every 4 hours as needed -  For cough >>>   tessilon 200 mg three to four times daily  GERD (REFLUX)   Please remember to go to the x-ray department downstairs in the basement  for your tests - we will call you with the results when they are available. If not better at 6 weeks you need to return to [facility] - if better then ok to refill meds thru your PCP or return here at 3 months to regroup re need for longterm treatment options - late add:  ? pna in rll/ ? Sarcoid changes also rec:  Return for esr/ cmet/ angiotensin, cbc with diff and rx Augmentin 875 mg take one pill twice daily  X 10 daysAnd  Prednisone  10 mg take  4 each am x 2 days,   2 each am x 2 days,  1 each am x 2 days and stop  And f/u with cxr in 2 weeks =  No change 12/29/16  - Angiotensin  12/18/16   138 with esr 44  - CT 01/05/2017   Extensive partially calcified mediastinal and bilateral hilar lymphadenopathy with extensive perilymphatic nodularity throughout the lungs bilaterally (right greater than left). This spectrum of imaging findings is compatible with the clinically suspected sarcoidosis.       01/06/2017  f/u ov/Wert re: probable sarcoid / ?uacs on gerd rx  Chief Complaint  Patient presents with   Follow-up    Pt hwew to discuss CT scan, He still has occ. sob,he is still coughing which causes him some dizziness,   rash has evolved over several years worse area R neck attributed to reaction to otc cream but noted over other parts of face where no cream was used  Cough is better but still requires rewwq tessilon All symptoms a lot better (x for the rash) while on short courses of prednisone New problem is painful swelling R elbow s trauma hx  rec Continue protonix and dymista as you are  If cough or breathing worse, take prednisone 10 mg x 2 until better then 1 daily x one week then stop  We will call you with appt to see dermatology and orthopedics    - Skin bx 01/09/17 :   Pos granulomatous dermatitis > rec 01/21/2017 start plaquenil 200 mg daily    03/10/2017  f/u ov/Wert re:  Plaquenil x 01/21/17 @ 200 mg daily  Chief Complaint  Patient presents with   Follow-up    Pt states he occ has a "strange sensation" in the right side of his chest. He states he feels tired most of the time, not much energy.    cough x onset 2011 then worse 09/2016 > resolved p prednisone and stayed gone p starting plaquenil as above Skin changes x 2016 also improving on plaquenil and steroid cream per derm  No need for prednisone since starting plaquenil  rec No change plaquenil dose for now  Please schedule a follow up office visit in 6  weeks, call sooner if needed with cxr on return  - consider taper off gerd rx if cough stays gone at next ov      02/25/2018  Pulmonary consultation: [facility] re: sarcoidosis on "prn prednisone/ did not maintain on plaquenil / needs clearance for R olecranon bursitis Chief Complaint  Patient presents with   Advice Only    Needing pulm clearance for right elbow surgery. He has occ cough- non prod.     Not limited by breathing from desired activities   Min daytime dry cough attributes to pnds / some better on dymista when can afford it, does not recall resp to 1st gen H1 blockers per guidelines   No ocular complaints  Sleeps flat ok  rec If rash worse > return to dermatology  If nasal symptoms worse > ent  See your eye doctor yearly    televist 09/06/18 History of Present Illness: 09/06/2018  f/u ov/Wert re: sarcoidosis / skin involvement/rheumatology  rec plaqueninl 200 x 2 rec prednisone 5 mg  Per Jonell Cluck x 20 mg x 4 days   Dyspnea:  Not limited by breathing from desired activities  / work out fine Cough: none on dymista /pantoprazole Sleeping: sleep flat  SABA use: none 02: none  rec Since your cough is better on dysmista and protonix and does not flare when you have perceive the sarcoid is getting worse it is unlikely related to your sarcoidosis so continue the dysmista/protonix  Ok to use protonix Take 30-60 min before first meal of the day but at onset of any cough you need to remember to Take 30- 60 min before your first and last meals of the day until cough is gone again I refilled your protonix today for the next 6 months but after that you will need to return here or see your PCP for this (as it's not really a pulmonary issue)  Take prednisone and plaquenil at your rheumatologists direction - the goal for plaquenil is to eventually wean you off of all prednisone.  Once the carona virus restrictions are listed we need to see you back here for PFT's/ ov so let's set this up for 6  months from now     12/08/2018  f/u ov/Wert re:  Sarcoidosis on plaquenil 200 x 2, no pred x months f/u by rheum Chief Complaint  Patient presents with   Follow-up    No co's today   Dyspnea:  Good ex tol Cough: betterp dymista  Sleeping: ok  SABA use: none  02: none    No obvious day to day or daytime variability or assoc excess/ purulent sputum or mucus plugs or hemoptysis or cp or chest tightness, subjective wheeze or overt sinus or hb symptoms.   Sleeping  without nocturnal  or early am exacerbation  of respiratory  c/o's or need for noct saba. Also denies any obvious fluctuation of symptoms with weather or environmental changes or other aggravating or alleviating factors except as outlined above   No unusual exposure hx or h/o childhood pna/ asthma or knowledge of premature birth.            OV 02/21/2020 -transfer of care from Dr. Sandrea Hughs to Dr. Marchelle Gearing.  History is provided by the patient and review of the chart.  Subjective:  Patient ID: [name], male , DOB: 1976-02-09 , age 89 y.o. , MRN: [id number] , ADDRESS: [address] [place] [address]   02/21/2020 -   Chief Complaint  Patient presents with   Follow-up    no problems     HPI Daison Stegen 47 y.o. 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  Initially seen by Dr. Shan Levans and then subsequently by Dr. Sandrea Hughs.  He says early on he was only treated with as needed prednisone with each course lasting approximately 1 month.  He does not remember being on chronic prednisone.  He has never been on immunomodulators.  He is always had a chronic cough which is a piece together was deemed as secondary to sinus issues also irritable larynx syndrome per review of the chart.  It appears a few years ago a chest x-ray was done and it showed significant pulmonary sarcoidosis.  At this point in time  he had a skin biopsy of chronic lesions on his face and this was in 2018 or so which showed features consistent with sarcoid.  After that somewhere along the way he got established with rheumatology.  He has been recommended to continue to see pulmonary.  Then in February 2021 he was admitted for septic emboli and cavitary pneumonia.  According to the discharge summary no specific diagnosis for the pneumonia is indicated but review of the labs indicate Legionella urine antigen positivity. Visualization of the CT scan shows significant worsening in consolidation particularly in the right upper lobe compared to October 2020.  After this somewhere along the way he went on chronic prednisone.  After going on chronic prednisone multiple symptoms such as excessive daytime somnolence and fatigue have improved although not fully resolved.  He is known to have sleep apnea and the results of below although he is not seeing a sleep specialist and is not using his CPAP even though has been advised to use 1.  In the past has had cough syncope and also excessive daytime somnolence and fatigue.  At this point in time his overall health status is somewhat better but he still has some residual dyspnea.  He denies any features of collagen vascular disease  Recent evaluation shows mild pulmonary venous hypertension, sleep apnea, significantly abnormal CT scan and elevated ACE level  In his February 2021 admission he was indeterminate for QuantiFERON gold.  His Covid was negative HIV negative cryptococcal antigen titers negative.  However he was Legionella positive    He reports he wants to get to the bottom of his problems    No flowsheet data  found.   Ref Range & Units 7 mo ago  QuantiFERON Incubation  Incubation performed.   QuantiFERON-TB Gold Plus Negative Indeterminate    Results for DECIMUS, GLUCKMAN (MRN [id number]) as of 02/21/2020 16:21  Ref. Range 07/17/2019 16:56  Strep Pneumo Urinary Antigen Latest Ref  Range: NEGATIVE  NEGATIVE  L. pneumophila Serogp 1 Ur Ag Latest Ref Range: Negative  Positive (A)  Results for COLBEN, DENNO (MRN [id number]) as of 02/21/2020 16:21  - per HPI  Right heart catheterization January 2021 by Dr. Gala Romney  Findings:   RA = 9 RV = 36/12 PA = 38/6 (24) PCW = 11 Fick cardiac output/index = 7.1/2.7 PVR = 1.9 WU FA sat = 98% PA sat = 72%, 75% SVC sat = 75%   Assessment:   1. Very mild PAH with normal PVR   Plan/Discussion:   Recommend weight loss and CPAP.    Arvilla Meres, MD  8:31 AM   Sleep study March 2021 interpreted by Dr. Carolanne Grumbling -was a CPAP titration study  - An optimal PAP pressure was selected for this patient ( 18 cm of water) - Central sleep apnea was not noted during this titration (CAI = 0.2/h). - Severe oxygen desaturations were observed during this titration (min O2 = 80.0%). - No snoring was audible during this study. - No cardiac abnormalities were observed during this study. - Clinically significant periodic limb movements were not noted during this study. Arousals associated with PLMs were rare.  Trial of CPAP therapy on 18 cm H2O with a Medium size Resmed Full Face Mask AirFit F20 mask and heated humidification -please not using it   IMPRESSION: CT CHEST FEB 2021 - personally visualized and when compared to October 2020 it is worse -this admission February 2021 labeled as septic emboli and community-acquired pneumonia with cavitary pneumonia. 1. Unfortunately, there is limited opacification of the pulmonary arteries. There is no evidence of large central pulmonary embolus in the main pulmonary artery or main portions of the right and left pulmonary arteries. However, smaller emboli in the more peripheral branches of the pulmonary arteries cannot be excluded on the basis of this exam. 2. Stable calcified adenopathy is noted consistent with history of sarcoidosis. 3. Interval development of multiple rounded  ill-defined opacities throughout both lungs concerning for multifocal pneumonia or septic emboli. The largest measures 9.7 x 8.6 cm in the right lung apex. Cavitary abnormality measuring 5.0 x 4.3 cm is noted in the left lower lobe posteriorly most consistent with cavitary pneumonia. 1.9 cm cavitary abnormality is also noted in the left lower lobe.     Electronically Signed   By: Lupita Raider M.D.   On: 07/16/2019 13:42   Results for MARIANO, MELON (MRN [id number]) as of 02/21/2020 12:09  Ref. Range 06/21/2010 20:05 12/18/2016 09:35 07/18/2019 11:09  IgE (Immunoglobulin E), Serum Latest Ref Range: 0.0 - 180.0 intl units/mL 27.6    Angiotensin-Converting Enzyme Latest Ref Range: 9 - 67 U/L  138 (H)   Cytoplasmic (C-ANCA) Latest Ref Range: Neg:<1:20 titer   <1:20  P-ANCA Latest Ref Range: Neg:<1:20 titer   <1:20  Atypical P-ANCA titer Latest Ref Range: Neg:<1:20 titer   <1:20   Results for KHUP, MERRIWEATHER (MRN [id number]) as of 02/21/2020 16:21  Ref. Range 10/14/2019 00:00 02/03/2020 09:27  Creatinine Latest Ref Range: 0.40 - 1.50 mg/dL 1.1 6.30  Results for AMANI, BARRACO (MRN [id number]) as of 02/21/2020 16:21  Ref. Range 07/20/2019 03:45 07/21/2019 04:06 08/01/2019 09:45 10/14/2019 00:00 02/03/2020 09:27  Hemoglobin Latest Ref Range: 13.0 - 17.0 g/dL 16.1 (L) 09.6 (L) 04.5 (L) 14.6 14.7      OV 04/05/2020  Subjective:  Patient ID: [name], male , DOB: 1976-02-03 , age 58 y.o. , MRN: [id number] , ADDRESS: [address] [place] [address] PCP McGowen, Maryjean Morn, MD Patient Care Team: Jeoffrey Massed, MD as PCP - General (Family Medicine) Melvenia Beam, MD as Consulting Physician (Otolaryngology) Debroah Loop, DO as Consulting Physician (Otolaryngology) Irena Cords, Enzo Montgomery, MD as Consulting Physician (Allergy and Immunology) Nyoka Cowden, MD as Consulting Physician (Pulmonary Disease) Teryl Lucy, MD as Consulting Physician (Orthopedic Surgery) Windy Carina, PA-C as Consulting Physician (Rheumatology) Bensimhon, Bevelyn Buckles, MD as Consulting Physician (Cardiology)  This Provider for this visit: Treatment Team:  Attending Provider: Kalman Shan, MD    04/05/2020 -   Chief Complaint  Patient presents with   Follow-up    PFT performed today. Pt states he has been doing well since last visit and denies any complaints.     HPI Akram Mobbs 47 y.o. - returns for follow-up.  At this point in time he is minimally symptomatic.  He still continues on his prednisone 7 spring of this year.  He says he has had his Covid vaccine but is in need of a booster.  He is yet to see his sleep specialist.  He had pulmonary function test that shows mild restriction to moderate restriction.  But he is essentially asymptomatic from a respiratory standpoint.  He had follow-up CT scan of the chest that shows improvement and resolution of the Legionella pneumonia that he suffered in February 2021.  However in comparing to old CT scans of the chest - he did NOT have sarcoidosis in 2011 but was present in 2018 and oct 2020. In both seemed inflammatory but now Oct 2021 seems fibrotic     CT chest high resolution Sept 2021  IMPRESSION: 1. Resolved bilateral multi lobar pneumonia. Residual postinfectious pneumatocele in the right upper lobe. 2. Extensive pulmonary parenchymal findings of sarcoidosis are stable in the interval since 07/16/2019 as detailed. 3. Stable calcified mediastinal and bilateral hilar adenopathy, compatible with sarcoidosis. 4. One vessel coronary atherosclerosis. 5. Symmetric mild bilateral gynecomastia, worsened.     Electronically Signed   By: Delbert Phenix M.D.   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   OV 09/06/2020  Subjective:  Patient ID: [name], male , DOB: Jan 09, 1976 , age 38 y.o. , MRN: [id number] , ADDRESS: [address] [place] [address] PCP McGowen, Maryjean Morn, MD Patient Care Team: Jeoffrey Massed, MD as PCP - General (Family Medicine) Quintella Reichert, MD as PCP - Sleep Medicine (Cardiology) Melvenia Beam, MD as Consulting Physician (Otolaryngology) Debroah Loop, DO as Consulting Physician (Otolaryngology) Irena Cords, Enzo Montgomery, MD as Consulting Physician (Allergy and Immunology) Nyoka Cowden, MD as Consulting Physician (Pulmonary Disease) Teryl Lucy, MD as Consulting Physician (Orthopedic Surgery) Windy Carina, PA-C as Consulting Physician (Rheumatology) Bensimhon, Bevelyn Buckles, MD as Consulting Physician (Cardiology)  This Provider for this visit: Treatment Team:  Attending  Provider: Kalman Shan, MD    09/06/2020 -   Chief Complaint  Patient presents with   Follow-up    Doing ok, felt CBD oil helped with SOB  #SARCOID - he did NOT have sarcoidosis in 2011 but was present in 2018 and oct 2020. In both seemed inflammatory but now se/pOct 2021 seems fibrotic (per radiology: oct 2020 -> Sept 2021 there is very mild worsening of scar tissue esp in the right upper side and this could just represent the scar from the lgionella pneumoa in feb t)  - on monitoring approach  # Legionella pneumonia that he suffered in February 2021.  ->residual pneumatociele Sept/Oct 2021 CT    #Lupus  HPI Jayveion Cockerell 47 y.o. -returns for follow-up.  Last visit was in the fall 2021.  At that time there is some concern about whether he was having progressive burned-out fibrotic lung disease from his sarcoid.  Radiology given  opinion that he might be having that.  So we decided to get a pulmonary function test.  Today's pulmonary function test shows slight decline.  But the range is extremely small.  Meanwhile he tells me this is lupus Perni is active the last few to several weeks his lupus pernio on the face and forearms have gotten worse.  Also worse in the shin possibly.  He saw rheumatology Elpidio Anis.  Some 3 weeks ago was placed on methotrexate 15 mg once a week.  Today he also increased his prednisone 5 5 mg to 40 mg because of his arthropathy and skin lesions.  He has upcoming appointment with dermatology in the middle of April 2022.  But from a breathing standpoint he is better/stable.  He said he took CBD oil and this helped him.  He plans to go back to it.      OV 04/02/2021  Subjective:  Patient ID: [name], male , DOB: December 30, 1975 , age 64 y.o. , MRN: [id number] , ADDRESS: [address] [place] [address] PCP McGowen, Maryjean Morn, MD Patient Care Team: Jeoffrey Massed, MD as PCP - General (Family Medicine) Quintella Reichert, MD as PCP - Sleep Medicine (Cardiology) Melvenia Beam, MD as Consulting Physician (Otolaryngology) Debroah Loop, DO as Consulting Physician (Otolaryngology) Irena Cords, Enzo Montgomery, MD as Consulting Physician (Allergy and Immunology) Nyoka Cowden, MD as Consulting Physician (Pulmonary Disease) Teryl Lucy, MD as Consulting Physician (Orthopedic Surgery) Sherrie George (Inactive) as Consulting Physician (Rheumatology) Bensimhon, Bevelyn Buckles, MD as Consulting Physician (Cardiology)  This Provider for this visit: Treatment Team:  Attending Provider: Kalman Shan, MD #  ? OSA  #SARCCOID  - ILD - he did NOT have sarcoidosis in 2011 but was present in 2018 and oct 2020. In both seemed inflammatory but now se/pOct 2021 seems fibrotic (per radiology: oct 2020 -> Sept 2021 there is very mild worsening of scar tissue esp in the right upper side and this could just  represent the scar from the lgionella pneumoa in feb t)  - #Lupurs pernio   #arhtropathy  # Legionella pneumonia that he suffered in February 2021.  ->residual pneumatociele Sept/Oct 2021 CT   #Right heart catheterization January 2021 with very mild PAH with normal PVR -Dr. Gala Romney.  PVR 1.9 work units, pulmonary capillary wedge pressure 11, pulmonary artery mean pressure 24 -did not meet criteria for inhaled treprostinil  #Immunosuppressed with chronic prednisone 5 mg/day, methotrexate 50 mg/week and Plaquenil 4 mg/day. -Treatment onset early 2022  04/02/2021 -  Chief Complaint  Patient presents with   Follow-up    Pt states he has been doing okay since last visit and denies any real complaints.     HPI Makarios Vliet 47 y.o. -returns for follow-up.  Last seen earlier in 2022.  Then last month he saw a nurse practitioner with bronchitis and given antibiotic and prednisone.  He feels back to baseline.  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  He was also not taking his methotrexate and folic acid and Plaquenil.  Our nurse practitioner refill these.  He is still yet established with [facility] rheumatology.  I have messaged Dr. Dierdre Forth.  I also had him call [facility] rheumatology while waiting in our office so that he can make a scheduled appointment.  I emphasized to him the need for compliance particularly with his immunosuppressed status.  He says after going back on his immunomodulators he is actually feeling better.  He had a CT scan of the chest that shows progressive sarcoid findings in the lung compared to 1 year earlier [facility] also has pulmonary hypertension.  He is going to see Dr. Gala Romney early January 2023.  His last echo was over a year ago.  He had a cath in January 2021 and just showed mild elevation pulmonary pressures but did not meet criteria for inhaled treprostinil.  He has not had any recent lab  work with him being on immunomodulators.  I expressed some concern about progressive lung findings.  He has not had pulmonary function test.  Unclear why.    CT Chest data - HRCT 02/22/21  Narrative & Impression  CLINICAL DATA:  Sarcoidosis   EXAM: CT CHEST WITHOUT CONTRAST   TECHNIQUE: Multidetector CT imaging of the chest was performed following the standard protocol without intravenous contrast. High resolution imaging of the lungs, as well as inspiratory and expiratory imaging, was performed.   COMPARISON:  02/28/2020, 07/16/2019, 03/16/2019, 01/05/2017, 04/19/2010   FINDINGS: Cardiovascular: No significant vascular findings. Normal heart size. No pericardial effusion. Enlargement of the main pulmonary artery, measuring up to 3.9 cm in caliber.   Mediastinum/Nodes: Enlarged, coarsely calcified lymph nodes throughout the mediastinum and hila. Thyroid gland, trachea, and esophagus demonstrate no significant findings.   Lungs/Pleura: Redemonstrated very extensive fine nodularity throughout the lungs, many of which are confluent in consolidations and concentrated along the pleural surfaces in fissures and with an upper lobe predominance. Extensive architectural distortion, particularly of the right upper lobe. No significant air trapping on expiratory phase imaging. 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 No significant change in a septated, cavitary, masslike consolidation of the perihilar right lung (series 10, image 141). There is significantly increased subpleural consolidation of the peripheral left lower lobe, largest cross-section measuring 2.9 x 1.6 cm (series 10, image 211). Increased consolidation posteriorly in the left upper lobe abutting the fissure, largest cross-sectional area measuring 3.5 x 2.2 cm (series 10, image 90). Increased consolidation peripherally in the superior segment right lower lobe, greatest cross-sectional area measuring 3.4 x  2.2 cm (series 10, image 171). No pleural effusion or pneumothorax.   Upper Abdomen: No acute abnormality.   Musculoskeletal: No chest wall mass or suspicious bone lesions identified.   IMPRESSION: 1. Redemonstrated very extensive fine nodularity throughout the lungs, many confluent in consolidations and concentrated along the pleural surfaces and fissures and with an upper lobe predominance. Architectural distortion, particularly of the right upper lobe. No significant  change in a septated, cavitary, masslike consolidation of the perihilar right lung. Multiple areas of increased consolidation in the bilateral lungs. 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 2. Enlarged, coarsely calcified lymph nodes throughout the mediastinum and hila, consistent with nodal sarcoidosis. 3. Enlargement of the main pulmonary artery, measuring up to 3.9 cm in caliber, as can be seen in pulmonary hypertension.     Electronically Signed   By: Lauralyn Primes M.D.   On: 02/24/2021 09:28       05/28/2021 Acute OV  Patient presents for an acute office visit.  Patient complains over the last 2 weeks he has had increased cough, congestion thick mucus and shortness of breath.  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  Unfortunately he lost his methotrexate 2 weeks ago.  He is on prednisone currently at 10 mg.  Patient says his breathing has not been doing as well.  Patient does has underlying sleep apnea.  Has not been wearing his CPAP.  Once again we reviewed the dangers of untreated sleep apnea.  Patient also has been using some CBD oil under his tongue which she says occasionally does help his breathing.  Patient denies any fever, chest pain, orthopnea, edema. Patient continues to follow with rheumatology and dermatology. Patient says that his skin lesion were improving but over the last 2 weeks they have been flaring  again.  OV 07/05/2021  Subjective:  Patient ID: [name], male , DOB: 1975-10-28 , age 47 y.o. , MRN: [id number] , ADDRESS: [address] [place] [address] PCP McGowen, Maryjean Morn, MD Patient Care Team: Jeoffrey Massed, MD as PCP - General (Family Medicine) Quintella Reichert, MD as PCP - Sleep Medicine (Cardiology) Melvenia Beam, MD as Consulting Physician (Otolaryngology) Debroah Loop, DO as Consulting Physician (Otolaryngology) Irena Cords, Enzo Montgomery, MD as Consulting Physician (Allergy and Immunology) Nyoka Cowden, MD as Consulting Physician (Pulmonary Disease) Teryl Lucy, MD as Consulting Physician (Orthopedic Surgery) Sherrie George (Inactive) as Consulting Physician (Rheumatology) Bensimhon, Bevelyn Buckles, MD as Consulting Physician (Cardiology)  This Provider for this visit: Treatment Team:  Attending Provider: Kalman Shan, MD    07/05/2021 -   Chief Complaint  Patient presents with   Follow-up    PFT performed today.  Pt states he has been doing okay since last visit and denies any complaints.    HPI Katriel Hailes 47 y.o. -returns for follow-up.  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  He has had pulmonary function test.  His pulmonary function test was stable between October 2021 in March 2022.  However since then there is a 5% decline in DLCO and a 3% decline in FVC.  However he is not feeling it.  He feels stable.  I asked him about his recent respiratory infection and relationship to methotrexate noncompliance.  He says he just ran out and was coincidental.  He is back to his baseline after Rikki Spearing treated him.  He continues on methotrexate prednisone and Plaquenil.  This originally prescribed by Dr. Alben Deeds rheumatologist.  I remember talking to Dr. Dierdre Forth myself and try to get the patient scheduled.  However he says he has not scheduled appointment  with him.  He does not know why.  In any event because of progression in the pulmonary function test I did indicate that in correlation with  the CT progression that there might be an indication here for additional therapy such as immunosuppressants through Dr. Dierdre Forth or even nintedanib through [place].  He had about the side effects of nintedanib which is not immunosuppressant.  He does not want to do it.  However he is agreed for close follow-up.  He is working on losing weight these are        OV 10/01/2021  Subjective:  Patient ID: [name], male , DOB: Mar 31, 1976 , age 39 y.o. , MRN: [id number] , ADDRESS: [address] [place] [address] PCP No primary care provider on file. Patient Care Team: Quintella Reichert, MD as PCP - Sleep Medicine (Cardiology) Melvenia Beam, MD as Consulting Physician (Otolaryngology) Debroah Loop, DO as Consulting Physician (Otolaryngology) Irena Cords, Enzo Montgomery, MD as Consulting Physician (Allergy and Immunology) Nyoka Cowden, MD as Consulting Physician (Pulmonary Disease) Teryl Lucy, MD as Consulting Physician (Orthopedic Surgery) Sherrie George (Inactive) as Consulting Physician (Rheumatology) Bensimhon, Bevelyn Buckles, MD as Consulting Physician (Cardiology)  This Provider for this visit: Treatment Team:  Attending Provider: Kalman Shan, MD Type of visit: Telephone/Video Circumstance: COVID-19 national emergency Identification of patient Sahan Kidder with May 09, 1976 and MRN [id number] - 2 person identifier Risks: Risks, benefits, limitations of telephone visit explained. Patient understood and verbalized agreement to proceed Anyone else on call: just him Patient location: his work [employer] This provider location: [address], [address]; [place]; [place] [address]. [facility]. [phone]     10/01/2021 -  No chief complaint on file.  HPI Tevyn Teo 47 y.o. -not been taking methotrexate x 4  months now. SAys he saw Dr Dierdre Forth few montsh ago and he is aware that patient not taking mtx.  But maintains pred 5mg  per day. SInce last visit - just 1 time had to boost prednisone - this past weekend for few days (Self directed). Feels he had a flare  - just generalized weakness though lungs were doing welll. SKin doing okay. Been taking prednisone daily but today run out but wants me to cal int pharmacy  PFT  - fvc declined 6% - dlco down 17%  - avg is 11.5%  However he says PFT might be down due to a technique issues because actually respiratory he feels better and able to climb hills better than before and walk better. Feels methotrexate did not make him feel better . Currently he is not inclined to restart methorxate  H e is also off plaquenil and does not want to start it. Feels insurance can be an issuies. Despite PFT decline and not taking medicines - he says he feels stronger x 2-3 months. H   He is content wanting to do breathing exercises. He has a IS at home possibly or can buy at [facility]   02/17/2022: Today - acute Patient presents today for acute visit.  He was sick in July; contacted the office with increased productive cough and was treated with prednisone taper and doxycycline.  He did initially get better but has never quite felt back to normal since then.  Then around a week ago, he contacted the office on 9/5 reporting increased productive cough with brown sputum.  Today, he reports feeling relatively unchanged if not worse.  He is still coughing up purulent sputum and having some pleuritic discomfort.  Feels like he has a lot of chest discomfort with deep breaths.  He did at one point think that he had some blood-tinged  sputum with a red speck in his mucus last week but has not had any further episodes of this.  Breathing is worse than it usually is.  He gets more winded with walking from the parking lot into the office.  Has not noticed any significant wheezing.  He has felt  febrile at times; has not checked his temperature at home but thinks that he has had low-grade fever.  He was 27 F today.  He also has a lesion on his left calf.  He spoke to his primary care doctor about this who recommended that he go to his dermatologist.  He has had this for many months now.  Does not seem to get worse or better with antibiotics.  No drainage that he has noticed.  It does not occasionally get swollen and warm to the touch.  It is also tender when he touches it which is not consistent with his normal sarcoid lesions.  Denies any mass hemoptysis, night sweats, orthopnea, leg swelling, calf pain, vision changes.  No recent sick exposures.  He is on daily prednisone 5 mg but still he ran out of his prescription so has not taken any today.  Stopped methotrexate and Plaquenil over a year ago due to insurance issues.  Has not seen Dr. Dierdre Forth in some time.   NOV 2023   Seen 9/25 by Rhunette Croft and at that visit doing better after course of augmentin and prednisone, still had some cough though.   Now feels similar to how he felt when he had pneumonia. Has had bloody streaks in phlegm over last couple of days. Phlegm overall decreasing still. Has had CP with cough. No fever, drenching night sweats.   Interval HPI  Sputum cultures never collected last visit, augmentin, prednisone, HRCT with 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  Does feel dramatically better than at last visit.   He smoked minimally <1-2 years 5-6 cigarettes/day many years ago. No recent MJ.     Otherwise pertinent review of systems is negative.  JAN 2024   46yM with sarcoid with cutaneous and pulmonary involvement f/b Dr. Marchelle Gearing and Dr. Dierdre Forth previously on MTX/HCQ (had been off of MTX), HTN, AR, venous insufficiency, OSA not on CPAP, GERD   Seen 9/25 by Rhunette Croft and at that visit doing better after course of augmentin and prednisone, still had some cough though.   Now feels similar to how he felt when he had pneumonia.  Has had bloody streaks in phlegm over last couple of days. Phlegm overall decreasing still. Has had CP with cough. No fever, drenching night sweats.   Interval HPI   Started on posaconazole and continued on prednisone 10 mg daily  No phlegm now, no bothersome cough. Down to prednisone 5 mg daily now.    FEB 2024 Dr Thora Lance   cutaneous and pulmonary involvement f/b Dr. Marchelle Gearing and Dr. Dierdre Forth previously on MTX/HCQ (had been off of MTX), HTN, AR, venous insufficiency, OSA not on CPAP, GERD   Seen 9/25 by Rhunette Croft and at that visit doing better after course of augmentin and prednisone, still had some cough though.   Now feels similar to how he felt when he had pneumonia. Has had bloody streaks in phlegm over last couple of days. Phlegm overall decreasing still. Has had CP with cough. No fever, drenching night sweats.   Interval HPI  Have had issues with finding affordable antifungal  Had weird episode last night where he had sudden onset dyspnea after the storm yesterday. He  self increased to 25 mg prednisone daily.   Has had 3 weeks of pleuritic R chest pain. Responsive to ibuprofen.   No hemoptysis since last visit  Otherwise pertinent review of systems is negative.   OV May 2024 with Dr Thora Lance  6yM with sarcoid with cutaneous and pulmonary involvement f/b Dr. Marchelle Gearing and Dr. Dierdre Forth previously on MTX/HCQ (had been off of MTX), HTN, AR, venous insufficiency, OSA not on CPAP, GERD   Seen 9/25 by Rhunette Croft and at that visit doing better after course of augmentin and prednisone, still had some cough though.   Now feels similar to how he felt when he had pneumonia. Has had bloody streaks in phlegm over last couple of days. Phlegm overall decreasing still. Has had CP with cough. No fever, drenching night sweats.   Interval HPI  Increased cough and mucus production, called in 5/14 and given z pack and prednisone taper. Felt better initially right after but still dealing with  productive cough. No hemoptysis.   Maintained on itra after last visit  Otherwise pertinent review of systems is negative.   OV 02/02/2023  Subjective:  Patient ID: [name], male , DOB: [birth year] , age 11 y.o. , MRN: [id number] , ADDRESS: [address] [place] [address] PCP de [place], Buren Kos, MD Patient Care Team: [place], Buren Kos, MD as PCP - General (Family Medicine) Quintella Reichert, MD as PCP - Sleep Medicine (Cardiology) Melvenia Beam, MD as Consulting Physician (Otolaryngology) Debroah Loop, DO as Consulting Physician (Otolaryngology) Irena Cords, Enzo Montgomery, MD as Consulting Physician (Allergy and Immunology) Nyoka Cowden, MD as Consulting Physician (Pulmonary Disease) Teryl Lucy, MD as Consulting Physician (Orthopedic Surgery) Windy Carina, PA-C (Inactive) as Consulting Physician (Rheumatology) Bensimhon, Bevelyn Buckles, MD as Consulting Physician (Cardiology)  This Provider for this visit: Treatment Team:  Attending Provider: Kalman Shan, MD    02/02/2023 -   Chief Complaint  Patient presents with   Follow-up    Chest tightness has been worse over the past 3-4 wks. He was prescribed Zpack a couple of weeks ago and has upped pred from 5 to 10 mg daily. Breathing is overall doing well.    HPI Mats Heaster 47 y.o. -returns for follow-up.  Is been a while since I saw him.  In the interim he ended up seeing nurse practitioner late 2023 with bronchitis symptoms.  This resulted in a CT scan and pulmonary function test.  He had new onset l right upper lobe aspergilloma.  He says around this time his sputum changed to red from yellow.  He does not think it was hemoptysis [I did tell him in retrospect it was hemoptysis from aspergilloma].  He was started on itraconazole and his sputum has improved and is quality of life is better although recently he is on azithromycin completing an prednisone burst.  He feels he needs Augmentin which has helped  him in the past although he is aware that it can cause diarrhea.  I have agreed to do some Augmentin today.  He did have a CT scan of the chest this month August 2024.  Partially visualized in the right upper lobe aspergilloma is worse.  I showed these findings to him and compared it last year.   CT Chest data from date: *auggut 2024  - personally visualized and independently interpreted : YES - my findings are: wrorsnien RUL cavitit to 4.8cm  IMPRESSION: 1. Spectrum of findings compatible with severe fibrotic pulmonary sarcoidosis,  including extensive finely nodular thickening of the peribronchovascular interstitium, fine nodularity and plaque-like consolidation along the fissures and subpleural lungs, thick-walled irregular lung cavities and architectural distortion throughout both lungs. 2. The thick-walled 4.8 x 4.0 cm peripheral right upper lobe cavity is mildly increased in size, wall thickening and nodular internal debris, compatible with mild progression of chronic fungal cavitary infection. The other pulmonary parenchymal findings are not appreciably changed. 3. Two-vessel coronary atherosclerosis. 4. Stable dilated main pulmonary artery, suggesting chronic pulmonary arterial hypertension.     Electronically Signed   By: Delbert Phenix M.D.   On: 01/24/2023 18:31    OV 03/30/2023  Subjective:  Patient ID: [name], male , DOB: July 28, 1975 , age 49 y.o. , MRN: [id number] , ADDRESS: [address] [place] [address] PCP [place], Buren Kos, MD Patient Care Team: [place], Buren Kos, MD as PCP - General (Family Medicine) Quintella Reichert, MD as PCP - Sleep Medicine (Cardiology) Melvenia Beam, MD as Consulting Physician (Otolaryngology) Debroah Loop, DO as Consulting Physician (Otolaryngology) Irena Cords, Enzo Montgomery, MD as Consulting Physician (Allergy and Immunology) Nyoka Cowden, MD as Consulting Physician (Pulmonary Disease) Teryl Lucy, MD as  Consulting Physician (Orthopedic Surgery) Windy Carina, PA-C (Inactive) as Consulting Physician (Rheumatology) Bensimhon, Bevelyn Buckles, MD as Consulting Physician (Cardiology)  This Provider for this visit: Treatment Team:  Attending Provider: Kalman Shan, MD    03/30/2023 -   Chief Complaint  Patient presents with   Follow-up    Issues with congestion and sputum build up    #SARCCOID  - ILD - he did NOT have sarcoidosis in 2011 but was present in 2018 and oct 2020. In both seemed inflammatory but now se/pOct 2021 seems fibrotic (per radiology: oct 2020 -> Sept 2021 there is very mild worsening of scar tissue esp in the right upper side and this could just represent the scar from the lgionella pneumoa in feb t)  - #Lupurs pernio   #arhtropathy  # Legionella pneumonia that he suffered in February 2021.  ->residual pneumatociele Sept/Oct 2021 CT   #Right heart catheterization January 2021 with very mild PAH with normal PVR -Dr. Gala Romney.  PVR 1.9 work units, pulmonary capillary wedge pressure 11, pulmonary artery mean pressure 24 -did not meet criteria for inhaled treprostinil  #Immunosuppressed with - since early 2022 - chronic prednisone 5 mg/day,  - methotrexate 15 mg/week - stopped few months ago  - Plaquenil 4 mg/day. 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 Algis Liming February 11, 2023  #Elevated IgE despite chronic prednisone in August 2024  -Denied in September 2024 due to lack of inhaled steroids.  HPI Lando Gluth 47 y.o. -presents for follow-up.  At the last visit we gave him some prednisone.  He tells me that overall  he is stable but he still quite symptomatic with shortness of breath and cough and congestion.  His cough is inconsistent.  Yesterday it was quite bad today it is okay.  He does have some yellow sputum.  He feels that his symptoms are not adequately relieved by interventions.  I did remind him that we referred him to infectious diseases.  He did see infectious disease Dr. Algis Liming February 11, 2023 and has been asked to continue his itraconazole.  Also referred to thoracic surgery but our thoracic surgeons felt he was complicated.  I thought it made a referral to [facility] but he has not been called by [employer].  Therefore make a rereferral again.  He is willing to give a sputum Gram stain and culture.  His blood IgE was high but his eosinophils were normal.  I recommended Xolair but the insurance denied it.  He needs to take inhaled corticosteroids.  Our pharmacist did counsel him and he preferred to take antihistamine.  Today he tells me he did not understand the conversation correctly and is willing to start inhaled corticosteroid.  I have given him 8 weeks 1 pill of Trelegy.  He was supposed to have pulmonary function test but is scheduled for April 17, 2023.  He did have echocardiogram for his coronary artery calcification it is normal.  He has upcoming appointment Dr. Raynelle Jan.  This currently no chest pain.    SYMPTOM SCALE - ILD 03/30/2023  Current weight   O2 use ra  Shortness of Breath 0 -> 5 scale with 5 being worst (score 6 If unable to do)  At rest 0  Simple tasks - showers, clothes change, eating, shaving 1  Household (dishes, doing bed, laundry) 2  Shopping 2  Walking level at own pace 1  Walking up Stairs 4  Total (30-36) Dyspnea Score 10  How bad is your cough? inconsistent  How bad is your fatigue some  How bad is nausea na  How bad is vomiting?  na  How bad is diarrhea? Very mild  How bad is anxiety? na  How bad is depression na  Any chronic pain - if so  where and how bad x       PFT     Latest Ref Rng & Units 04/30/2022    1:58 PM 10/01/2021    3:02 PM 07/05/2021    3:00 PM 09/06/2020    2:52 PM 07/24/2020    1:01 PM 04/05/2020    3:44 PM  ILD indicators  FVC-Pre L 2.96  2.77  2.96  3.04  3.04  3.30   FVC-Predicted Pre % 50  56  60  61  61  66   FVC-Post L 3.13  2.91  3.06  3.29  3.29  3.38   FVC-Predicted Post % 53  59  62  66  66  68   TLC L 4.81  4.47  4.62  4.72  4.72  4.87   TLC Predicted % 61  57  59  60  60  62   DLCO uncorrected ml/min/mmHg 23.83  19.66  23.39  24.55  24.55  25.13   DLCO UNC %Pred % 70  58  68  72  72  73   DLCO Corrected ml/min/mmHg 23.83  19.66  23.39  24.55  24.55  25.13   DLCO COR %Pred % 70  58  68  72  72  73       LAB RESULTS last 96 hours No results found.  LAB RESULTS last 90 days Recent Results (from the past 2160 hour(s))  ECHOCARDIOGRAM COMPLETE     Status: None   Collection Time: 02/04/23 11:02 AM  Result Value Ref Range   Area-P 1/2 3.45 cm2   S' Lateral 2.80 cm   Est EF 60 - 65%   CBC w/Diff  Status: Abnormal   Collection Time: 02/23/23  4:36 PM  Result Value Ref Range   WBC 6.7 4.0 - 10.5 K/uL   RBC 6.30 (H) 4.22 - 5.81 Mil/uL   Hemoglobin 13.7 13.0 - 17.0 g/dL   HCT 78.2 95.6 - 21.3 %   MCV 70.1 (L) 78.0 - 100.0 fl   MCHC 31.1 30.0 - 36.0 g/dL   RDW 08.6 (H) 57.8 - 46.9 %   Platelets 225.0 150.0 - 400.0 K/uL   Neutrophils Relative % 81.4 (H) 43.0 - 77.0 %   Lymphocytes Relative 6.2 (L) 12.0 - 46.0 %   Monocytes Relative 9.8 3.0 - 12.0 %   Eosinophils Relative 2.2 0.0 - 5.0 %   Basophils Relative 0.4 0.0 - 3.0 %   Neutro Abs 5.4 1.4 - 7.7 K/uL   Lymphs Abs 0.4 (L) 0.7 - 4.0 K/uL   Monocytes Absolute 0.7 0.1 - 1.0 K/uL   Eosinophils Absolute 0.1 0.0 - 0.7 K/uL   Basophils Absolute 0.0 0.0 - 0.1 K/uL  IgE     Status: Abnormal   Collection Time: 02/23/23  4:36 PM  Result Value Ref Range   IgE (Immunoglobulin E), Serum 366 (H) <OR=114 kU/L         has a past  medical history of 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   reports that he quit smoking about 27 years ago. His smoking use included cigarettes. He started smoking about 29 years ago. He has a 0.4 pack-year smoking history. He has never used smokeless tobacco.  Past Surgical History:  Procedure Laterality Date   OLECRANON BURSECTOMY Right 2019   Polysomnogram  07/28/2019   Severe OSA w/hypoxia, mild central sleep apnea, CPAP recommended (Dr. Armanda Magic)   RIGHT HEART CATH N/A 06/27/2019   Mild PAH. Procedure: RIGHT HEART CATH;  Surgeon: Dolores Patty, MD;  Location: [facility] INVASIVE CV LAB;  Service: Cardiovascular;  Laterality: N/A;   SKIN BIOPSY  2018   Sarcoidosis of skin (also has pulm involvement).   SKIN GRAFT  2010   on left index finger   TRANSTHORACIC ECHOCARDIOGRAM  06/22/2019   NORMAL   Vocal cord surgery  2016   [facility]   WISDOM TOOTH EXTRACTION  2005    Allergies  Allergen Reactions   Wasp Venom Swelling    Immunization History  Administered Date(s) Administered   Influenza, Seasonal, Injecte, Preservative Fre 02/11/2023   Influenza,inj,Quad PF,6+ Mos 02/21/2020, 04/02/2021, 02/12/2022   Influenza-Unspecified 04/08/2019   PFIZER(Purple Top)SARS-COV-2 Vaccination 08/20/2019, 09/10/2019   Pneumococcal Polysaccharide-23 08/01/2019   Tdap 04/20/2018    Family History  Problem Relation Age of Onset   Sarcoidosis Father        ?   Lactose intolerance Father    Other Mother        colon issues-portion of colon removed   Lactose intolerance Mother     Cancer Paternal Grandfather        ?     Current Outpatient Medications:    albuterol (PROVENTIL) (2.5 MG/3ML) 0.083% nebulizer solution, Take 3 mLs (2.5 mg total) by nebulization every 6 (six) hours as needed for wheezing or shortness of breath., Disp: 75 mL, Rfl: 12   albuterol (VENTOLIN HFA)  108 (90 Base) MCG/ACT inhaler, Inhale 2 puffs into the lungs every 6 (six) hours as needed for wheezing or shortness of breath., Disp: 8 g, Rfl: 6   amLODipine (NORVASC) 10 MG tablet, Take 1 tablet (10 mg total) by mouth daily., Disp: 90 tablet, Rfl: 1   amoxicillin-clavulanate (AUGMENTIN) 875-125 MG tablet, Take 1 tablet by mouth 2 (two) times daily., Disp: 10 tablet, Rfl: 0   Azelastine-Fluticasone 137-50 MCG/ACT SUSP, Place 2 sprays into both nostrils daily., Disp: 23 g, Rfl: 11   clobetasol ointment (TEMOVATE) 0.05 %, Apply topically., Disp: , Rfl:    Clocortolone Pivalate (CLODERM) 0.1 % cream, Apply 1 application topically 2 (two) times daily as needed (Sarcoidosis/rash). , Disp: , Rfl:    Fluticasone-Umeclidin-Vilant (TRELEGY ELLIPTA) 100-62.5-25 MCG/ACT AEPB, Inhale 1 puff into the lungs daily., Disp: , Rfl:    folic acid (FOLVITE) 1 MG tablet, Take 1 tablet (1 mg total) by mouth daily., Disp: 30 tablet, Rfl: 5   hydrocortisone butyrate (LUCOID) 0.1 % CREA cream, Apply 1 application topically 2 (two) times daily. (Patient taking differently: Apply 1 application  topically 2 (two) times daily. As needed), Disp: 60 g, Rfl: 6   hydrOXYzine (ATARAX/VISTARIL) 10 MG tablet, , Disp: , Rfl:    itraconazole (SPORANOX) 100 MG capsule, Take 2 capsules (200 mg total) by mouth 2 (two) times daily., Disp: 120 capsule, Rfl: 5   levocetirizine (XYZAL) 5 MG tablet, Take 0.5 tablets (2.5 mg total) by mouth every evening., Disp: 90 tablet, Rfl: 1   losartan (COZAAR) 50 MG tablet, TAKE 1 TABLET(50 MG) BY MOUTH DAILY, NEEDS FOLLOW UP APPOINTMENT FOR MORE REFILLS, Disp: 90 tablet, Rfl: 2   meloxicam (MOBIC) 7.5 MG  tablet, Take 1 tablet (7.5 mg total) by mouth daily., Disp: 30 tablet, Rfl: 0   metoprolol succinate (TOPROL-XL) 25 MG 24 hr tablet, Take 1.5 tablets (37.5 mg total) by mouth daily., Disp: 45 tablet, Rfl: 3   pantoprazole (PROTONIX) 40 MG tablet, TAKE 1 TABLET BY MOUTH 30 TO 60 MINUTES BEFORE YOUR FIRST AND LAST MEALS OF THE DAY, Disp: 180 tablet, Rfl: 3   predniSONE (DELTASONE) 10 MG tablet, 40 mg daily x 2 days, then 20mg  daily x 2 days, then 10mg  daily x 2 days,  5mg  daily to continue, Disp: 60 tablet, Rfl: 0   predniSONE (DELTASONE) 5 MG tablet, Take 1 tablet (5 mg total) by mouth daily with breakfast., Disp: 30 tablet, Rfl: 6   spironolactone (ALDACTONE) 25 MG tablet, TAKE 1 TABLET(25 MG) BY MOUTH DAILY, Disp: 15 tablet, Rfl: 0   Tiotropium Bromide-Olodaterol (STIOLTO RESPIMAT) 2.5-2.5 MCG/ACT AERS, Inhale 2 puffs into the lungs daily., Disp: 4 g, Rfl: 11   triamcinolone cream (KENALOG) 0.1 %, SMARTSIG:1 Application Topical 2-3 Times Daily, Disp: , Rfl:       Objective:   Vitals:   03/30/23 0910  BP: (!) 165/76  Pulse: 96  Temp: 97.8 F (36.6 C)  TempSrc: Oral  SpO2: 93%  Weight: (!) 305 lb 12.8 oz (138.7 kg)  Height: 6\' 2"  (1.88 m)    Estimated body mass index is 39.26 kg/m as calculated from the following:   Height as of this encounter: 6\' 2"  (1.88 m).   Weight as of this encounter: 305 lb 12.8 oz (138.7 kg).  @WEIGHTCHANGE @  Filed Weights   03/30/23 0910  Weight: (!) 305 lb 12.8 oz (138.7 kg)     Physical Exam   General: No distress. Looks well O2 at rest: no Cane present:  no Sitting in wheel chair: no Frail: no Obese: yes Neuro: Alert and Oriented x 3. GCS 15. Speech normal Psych: Pleasant Resp:  Barrel Chest - no.  Wheeze - no, Crackles - no, No overt respiratory distress CVS: Normal heart sounds. Murmurs - no Ext: Stigmata of Connective Tissue Disease - no SKIN : Sarcoid on face HEENT: Normal upper airway. PEERL +. No post nasal drip         Assessment:       ICD-10-CM   1. Sarcoidosis with skin and lung involvement   D86.9 Respiratory or Resp and Sputum Culture    Perennial allergen profile IgE    Ambulatory referral to Cardiothoracic Surgery    CANCELED: Quantiferon tb gold assay (blood)    2. 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 Bronchiectasis with acute exacerbation (HCC)  J47.1     4. Coronary artery calcification seen on CT scan  I25.10     5. Sarcoidosis  D86.9 QuantiFERON-TB Gold Plus    6. Aspergillosis (HCC)  B44.9 QuantiFERON-TB Gold Plus    7. Chronic cough  R05.3 QuantiFERON-TB Gold Plus    8. Elevated IgE level  R76.8          Plan:     Patient Instructions    Pulmonary aspergilloma [facility]) -onset is October 2023 and worse in August 2024 despite itraconazole  - glad you saw ID Dr Daiva Eves 02/11/23 - noted that our thoracic surgeons want  you seen by [facility]  Plan  - Continue itraconazole for now per DR Synthia Innocent - Refer thoracic surgery for possible lobectomy evaluation  - [facility]   Coronary artery calcification seen on CT scan Enlarged pulmonary artery (HCC)  -You have these on the CT scan although there is no current chest pain - echo normal   Plan - keep upcoming appointment with Dr Izora Ribas  Sarcoidosis with elevated IgE  -This is ongoing but now complicated by aspergilloma in the right upper lobe - there high IgE suggesting co-existent allergic asthma - noted that Xolair was denied till you try inhaled steroid - you still have yellow sputum  Plan - Give sputum for gram stain and culture - Continue daily prednisone - check Quantiferon gold and RAST allergy panel blood work 03/30/2023 - START trelegy sample (100) at 1 puff once daily -Do spirometry and DLCO on 04/17/23  - review at followup in 2 months -> based on response will consider Xolair    Follow-up - Return to see Dr. Marchelle Gearing 30-minute visit in 2 months- 2nd or 3rd week dec  2024   FOLLOWUP Return in about 8 weeks (around 05/25/2023) for 30 min visit, Sarcoidosis, with Dr Marchelle Gearing.    SIGNATURE    Dr. Kalman Shan, M.D., F.C.C.P,  Pulmonary and Critical Care Medicine Staff Physician, [facility] Center Director - Interstitial Lung Disease  Program  Pulmonary [facility] at [facility] [place], [place], [address]  Pager: [phone], If no answer or between  15:00h - 7:00h: call [phone] Telephone: [phone]  9:41 AM 03/30/2023

## 2023-03-30 NOTE — Patient Instructions (Addendum)
   Pulmonary aspergilloma [facility]) -onset is October 2023 and worse in August 2024 despite itraconazole  - glad you saw ID Dr Daiva Eves 02/11/23 - noted that our thoracic surgeons want  you seen by [facility]  Plan  - Continue itraconazole for now per DR Synthia Innocent - Refer thoracic surgery for possible lobectomy evaluation  - [facility] 03/30/2023   Coronary artery calcification seen on CT scan Enlarged pulmonary artery (HCC)  -You have these on the CT scan although there is no current chest pain - echo normal   Plan - keep upcoming appointment with Dr Izora Ribas  Sarcoidosis with elevated IgE  -This is ongoing but now complicated by aspergilloma in the right upper lobe - there high IgE suggesting co-existent allergic asthma - noted that Xolair was denied till you try inhaled steroid - you still have yellow sputum  Plan - Give sputum for gram stain and culture - Continue daily prednisone - check Quantiferon gold and RAST allergy panel blood work 03/30/2023 - START trelegy sample (100) at 1 puff once daily -Do spirometry and DLCO on 04/17/23  - review at followup in 2 months -> based on response will consider Xolair    Follow-up - Return to see Dr. Marchelle Gearing 30-minute visit in 2 months- 2nd or 3rd week dec 2024

## 2023-04-01 LAB — QUANTIFERON-TB GOLD PLUS
Mitogen-NIL: 0.98 [IU]/mL
NIL: 0.06 [IU]/mL
QuantiFERON-TB Gold Plus: NEGATIVE
TB1-NIL: 0 [IU]/mL
TB2-NIL: <0 [IU]/mL

## 2023-04-02 LAB — ALLERGEN PROFILE, PERENNIAL ALLERGEN IGE
Alternaria Alternata IgE: <0.1 kU/L
Aspergillus Fumigatus IgE: 3.79 kU/L — AB
Aureobasidi Pullulans IgE: <0.1 kU/L
Candida Albicans IgE: <0.1 kU/L
Cat Dander IgE: <0.1 kU/L
Chicken Feathers IgE: <0.1 kU/L
Cladosporium Herbarum IgE: <0.1 kU/L
Cow Dander IgE: <0.1 kU/L
D Farinae IgE: <0.1 kU/L
D Pteronyssinus IgE: <0.1 kU/L
Dog Dander IgE: <0.1 kU/L
Duck Feathers IgE: <0.1 kU/L
Goose Feathers IgE: <0.1 kU/L
Mouse Urine IgE: <0.1 kU/L
Mucor Racemosus IgE: <0.1 kU/L
Penicillium Chrysogen IgE: 0.31 kU/L — AB
Phoma Betae IgE: <0.1 kU/L
Setomelanomma Rostrat: <0.1 kU/L
Stemphylium Herbarum IgE: <0.1 kU/L

## 2023-04-13 NOTE — [place] Notes (Unsigned)
Cardiology Office Note:  .    Date:  04/15/2023  ID:  Sean Sharp, DOB 1976/03/12, MRN [id number] PCP: de Peru, Raymond J, MD  [facility] Providers Cardiologist:  None Sleep Medicine:  Armanda Magic, MD     CC: General Cardiology Support Consulted for the evaluation of Sarcoidosis associated pulmonary hypertension at the behest of Dr. de Peru  History of Present Illness: .    Sean Sharp is a 47 y.o. male Mild PH related to OSA and pulmonary sarcoidosis, morbid obesity, HTN. He is managed by Dr. Teressa Lower (AHF), Dr. Mayford Knife (OSA) and Dr. Marchelle Gearing (Sarcoid).  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 Despite this, he admits to not using his prescribed CPAP device- notes that it only works for one week.. He has not experienced chest pain, pressure, or shortness of breath. However, he notes that bronchial infections in the past have led to cough-associated lightheadedness. He has been making efforts to lose weight but has had issues with his CPAP apparatus. 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  He has been managed by the advanced heart failure group, sleep medicine team, and PCCM colleagues.   Relevant histories: .  Social BM patient, TT Patient, [employer] fan ROS: As per HPI.   Studies Reviewed: .   Cardiac Studies & Procedures   CARDIAC CATHETERIZATION  CARDIAC CATHETERIZATION 06/27/2019  Narrative Findings:  RA = 9 RV = 36/12 PA = 38/6 (24) PCW = 11 Fick cardiac output/index = 7.1/2.7 PVR = 1.9 WU FA sat = 98% PA sat = 72%, 75% SVC sat = 75%  Assessment:  1. Very mild PAH with normal PVR  Plan/Discussion:  Recommend weight loss and CPAP.  Arvilla Meres, MD 8:31 AM     ECHOCARDIOGRAM  ECHOCARDIOGRAM COMPLETE  02/04/2023  Narrative ECHOCARDIOGRAM REPORT    Patient Name:   Sean Sharp Date of Exam: 02/04/2023 Medical Rec #:  [id number]        Height:       74.0 in Accession #:    [id number]       Weight:       303.0 lb Date of Birth:  1975-12-31         BSA:          2.593 m Patient Age:    47 years         BP:           144/82 mmHg Patient Gender: M                HR:           80 bpm. Exam Location:  [address]  Procedure: 2D Echo, Color Doppler and Cardiac Doppler  Indications:    R06.00 Dyspnea  History:        Patient has prior history of Echocardiogram examinations, most recent 06/13/2021. Pulmonary HTN and COPD, Signs/Symptoms:Dyspnea, Edema and Fatigue; Risk Factors:Hypertension, Former Smoker and Sleep Apnea. Sarcoidosis, Obesity.  Sonographer:    Farrel Conners RDCS Referring Phys: Kalman Shan  IMPRESSIONS   1. Left ventricular ejection fraction, by estimation, is 60 to 65%. The left ventricle has normal function. The left ventricle has no regional wall motion abnormalities. Left ventricular diastolic parameters were normal. 2. Right ventricular systolic function is normal. The right ventricular size is normal.  Tricuspid regurgitation signal is inadequate for assessing PA pressure. 3. The mitral valve is normal in structure. Trivial mitral valve regurgitation. No evidence of mitral stenosis. 4. The aortic valve is normal in structure. Aortic valve regurgitation is not visualized. No aortic stenosis is present. 5. The inferior vena cava is normal in size with greater than 50% respiratory variability, suggesting right atrial pressure of 3 mmHg.  FINDINGS Left Ventricle: Left ventricular ejection fraction, by estimation, is 60 to 65%. The left ventricle has normal function. The left ventricle has no regional wall motion abnormalities. The left ventricular internal cavity size was normal in size. There is no left ventricular hypertrophy. Left ventricular diastolic  parameters were normal.  Right Ventricle: The right ventricular size is normal. No increase in right ventricular wall thickness. Right ventricular systolic function is normal. Tricuspid regurgitation signal is inadequate for assessing PA pressure.  Left Atrium: Left atrial size was normal in size.  Right Atrium: Right atrial size was normal in size.  Pericardium: There is no evidence of pericardial effusion.  Mitral Valve: The mitral valve is normal in structure. Trivial mitral valve regurgitation. No evidence of mitral valve stenosis.  Tricuspid Valve: The tricuspid valve is normal in structure. Tricuspid valve regurgitation is trivial. No evidence of tricuspid stenosis.  Aortic Valve: The aortic valve is normal in structure. Aortic valve regurgitation is not visualized. No aortic stenosis is present.  Pulmonic Valve: The pulmonic valve was normal in structure. Pulmonic valve regurgitation is trivial. No evidence of pulmonic stenosis.  Aorta: The aortic root is normal in size and structure.  Venous: The inferior vena cava is normal in size with greater than 50% respiratory variability, suggesting right atrial pressure of 3 mmHg.  IAS/Shunts: No atrial level shunt detected by color flow Doppler.   LEFT VENTRICLE PLAX 2D LVIDd:         4.30 cm   Diastology LVIDs:         2.80 cm   LV e' medial:    7.18 cm/s LV PW:         0.90 cm   LV E/e' medial:  12.3 LV IVS:        1.10 cm   LV e' lateral:   13.80 cm/s LVOT diam:     2.80 cm   LV E/e' lateral: 6.4 LV SV:         124 LV SV Index:   48 LVOT Area:     6.16 cm   RIGHT VENTRICLE RV Basal diam:  3.95 cm RV Mid diam:    4.30 cm RV S prime:     14.90 cm/s TAPSE (M-mode): 2.7 cm  LEFT ATRIUM             Index        RIGHT ATRIUM           Index LA diam:        3.60 cm 1.39 cm/m   RA Pressure: 3.00 mmHg LA Vol (A2C):   57.6 ml 22.21 ml/m  RA Area:     14.40 cm LA Vol (A4C):   42.7 ml 16.47 ml/m  RA Volume:   38.20 ml  14.73  ml/m LA Biplane Vol: 55.1 ml 21.25 ml/m AORTIC VALVE LVOT Vmax:   103.50 cm/s LVOT Vmean:  65.500 cm/s LVOT VTI:    0.202 m  AORTA Ao Root diam: 3.40 cm Ao Asc diam:  3.70 cm  MITRAL VALVE  TRICUSPID VALVE MV Area (PHT): cm         Estimated RAP:  3.00 mmHg MV Decel Time: 220 msec MV E velocity: 88.05 cm/s  SHUNTS MV A velocity: 72.85 cm/s  Systemic VTI:  0.20 m MV E/A ratio:  1.21        Systemic Diam: 2.80 cm  Arvilla Meres MD Electronically signed by Arvilla Meres MD Signature Date/Time: 02/04/2023/11:23:11 AM    Final             Physical Exam:    VS:  BP (!) 140/80   Pulse 78   Ht 6\' 2"  (1.88 m)   Wt (!) 302 lb 3.2 oz (137.1 kg)   SpO2 96%   BMI 38.80 kg/m    Wt Readings from Last 3 Encounters:  04/15/23 (!) 302 lb 3.2 oz (137.1 kg)  03/30/23 (!) 305 lb 12.8 oz (138.7 kg)  02/11/23 (!) 303 lb (137.4 kg)    Gen: no distress, morbid obesity   Neck: No JVD Cardiac: No Rubs or Gallops, no murmur, RRR +2 radial pulses Respiratory: Clear to auscultation bilaterally, normal effort, normal  respiratory rate GI: Soft, nontender, non-distended  MS: No  edema;  moves all extremities Integument: Skin feels warm, has granulomas on skin Neuro:  At time of evaluation, alert and oriented to person/place/time/situation  Psych: Normal affect, patient feels well   ASSESSMENT AND PLAN: .    Pulmonary Hypertension - WHO Functional Class I_II combination, Stage II, euvolemic, WHO I, II, III combination in the setting of morbid obesity, Pulmonary sarcoidosis, and OSA - :  920 m - No signs or symptoms of connective tissue disease and no family history of the CT or PH  - No FenPhen - No methamphetamine or other illicit substance use - on norvasc 10 mg - No history of DVT or PE to suggest chronic thromboembolic disease; VQ Scan deferred  - Last RHC:2021 - Pulm/AHF: Ramaswamy/Bensimohn - no advanced therapies REVEAL Lite 2 Risk score;  3  Obstructive Sleep Apnea Non-compliant with CPAP therapy. Considering Inspire device as an alternative (he has deferred)  Hypertension increase Losartan to 100 mg PO daily. Previously on [place] prior -Order basic metabolic panel and basic natriuretic peptide in 1-2 weeks  Obesity - Considering GLP-1 receptor antagonist therapy for weight loss, pending insurance coverage. - his wife was on this; he has deferred this evaluation  Cardiac Sarcoidosis Eval No current symptoms, but patient has a history of pulmonary sarcoidosis. -If patient develops palpitations, order heart monitor and cardiac MRI to evaluate for cardiac sarcoidosis.  -If asymptomatic, follow up in one year.   Sean Lam, MD FASE [facility] Cardiologist [facility]  [address] [place], [address] [place], [place] [address] [phone]  12:00 PM

## 2023-04-15 ENCOUNTER — Encounter: Payer: Self-pay | Admitting: Internal Medicine

## 2023-04-15 ENCOUNTER — Ambulatory Visit: Payer: BC Managed Care – PPO | Attending: Internal Medicine | Admitting: Internal Medicine

## 2023-04-15 VITALS — BP 140/80 | HR 78 | Ht 74.0 in | Wt 302.2 lb

## 2023-04-15 DIAGNOSIS — I10 Essential (primary) hypertension: Secondary | ICD-10-CM

## 2023-04-15 DIAGNOSIS — G4733 Obstructive sleep apnea (adult) (pediatric): Secondary | ICD-10-CM

## 2023-04-15 DIAGNOSIS — I272 Pulmonary hypertension, unspecified: Secondary | ICD-10-CM | POA: Diagnosis not present

## 2023-04-15 DIAGNOSIS — I288 Other diseases of pulmonary vessels: Secondary | ICD-10-CM

## 2023-04-15 DIAGNOSIS — D869 Sarcoidosis, unspecified: Secondary | ICD-10-CM

## 2023-04-15 MED ORDER — LOSARTAN POTASSIUM 100 MG PO TABS: 100.0000 mg | ORAL_TABLET | Freq: Every day | ORAL | 3 refills | Status: AC

## 2023-04-15 NOTE — Patient Instructions (Addendum)
Medication Instructions:  Increase Losartan to 100 mg a day   *If you need a refill on your cardiac medications before your next appointment, please call your pharmacy*   Lab Work: Bmet, and pro bnp in one week   If you have labs (blood work) drawn today and your tests are completely normal, you will receive your results only by: MyChart Message (if you have MyChart) OR A paper copy in the mail If you have any lab test that is abnormal or we need to change your treatment, we will call you to review the results.   Testing/Procedures:    Follow-Up: At [facility], you and your health needs are our priority.  As part of our continuing mission to provide you with exceptional heart care, we have created designated Provider Care Teams.  These Care Teams include your primary Cardiologist (physician) and Advanced Practice Providers (APPs -  Physician Assistants and Nurse Practitioners) who all work together to provide you with the care you need, when you need it.  We recommend signing up for the patient portal called "MyChart".  Sign up information is provided on this After Visit Summary.  MyChart is used to connect with patients for Virtual Visits (Telemedicine).  Patients are able to view lab/test results, encounter notes, upcoming appointments, etc.  Non-urgent messages can be sent to your provider as well.   To learn more about what you can do with MyChart, go to [email or web address].    Your next appointment: 6 months with Dr Izora Ribas

## 2023-04-20 ENCOUNTER — Ambulatory Visit (INDEPENDENT_AMBULATORY_CARE_PROVIDER_SITE_OTHER): Payer: BC Managed Care – PPO | Admitting: Internal Medicine

## 2023-04-20 DIAGNOSIS — D869 Sarcoidosis, unspecified: Secondary | ICD-10-CM

## 2023-04-20 DIAGNOSIS — B441 Other pulmonary aspergillosis: Secondary | ICD-10-CM

## 2023-04-20 LAB — PULMONARY FUNCTION TEST
DL/VA % pred: 144 %
DL/VA: 6.43 ml/min/mmHg/L
DLCO cor % pred: 75 %
DLCO cor: 24.55 ml/min/mmHg
DLCO unc % pred: 73 %
DLCO unc: 23.91 ml/min/mmHg
FEF 25-75 Pre: 1.11 L/s
FEF2575-%Pred-Pre: 28 %
FEV1-%Pred-Pre: 44 %
FEV1-Pre: 1.95 L
FEV1FVC-%Pred-Pre: 89 %
FEV6-%Pred-Pre: 50 %
FEV6-Pre: 2.74 L
FEV6FVC-%Pred-Pre: 103 %
FVC-%Pred-Pre: 49 %
FVC-Pre: 2.8 L
Pre FEV1/FVC ratio: 70 %
Pre FEV6/FVC Ratio: 100 %

## 2023-04-20 MED ORDER — BUDESONIDE-FORMOTEROL FUMARATE 160-4.5 MCG/ACT IN AERO
2.0000 | INHALATION_SPRAY | Freq: Two times a day (BID) | RESPIRATORY_TRACT | 12 refills | Status: AC
Start: 2023-04-20 — End: ?

## 2023-04-20 NOTE — Telephone Encounter (Signed)
Symbicort sent to preferred pharmacy. ?Patient is aware and voiced his understanding.  ?Nothing further needed.  ?

## 2023-04-20 NOTE — [place] Notes (Signed)
Spirometry and DLCO Performed Today.  

## 2023-04-20 NOTE — Telephone Encounter (Signed)
Upto him but cheapest is Wixela/advair disk or symbicort. He can do symbicort 160 strength  at 2 puff bid

## 2023-04-20 NOTE — Patient Instructions (Signed)
Spirometry and DLCO Performed Today.  

## 2023-04-20 NOTE — Addendum Note (Signed)
Addended by: Lajoyce Lauber A on: 04/20/2023 03:55 PM   Modules accepted: Orders

## 2023-04-28 ENCOUNTER — Ambulatory Visit: Payer: BC Managed Care – PPO | Admitting: Infectious Disease

## 2023-04-29 LAB — LAB REPORT - SCANNED: EGFR: 85

## 2023-05-04 ENCOUNTER — Ambulatory Visit: Payer: BC Managed Care – PPO | Admitting: Infectious Disease

## 2023-05-26 ENCOUNTER — Ambulatory Visit: Payer: BC Managed Care – PPO | Admitting: Internal Medicine

## 2023-06-16 ENCOUNTER — Other Ambulatory Visit: Payer: Self-pay | Admitting: Nurse Practitioner

## 2023-06-16 DIAGNOSIS — J189 Pneumonia, unspecified organism: Secondary | ICD-10-CM

## 2023-06-16 DIAGNOSIS — D869 Sarcoidosis, unspecified: Secondary | ICD-10-CM

## 2023-06-17 NOTE — Telephone Encounter (Signed)
 Pt was last seen 03-10-23, and the lov does not state to continue this. Pt is requesting a refill. Please advise.

## 2023-08-10 ENCOUNTER — Encounter (HOSPITAL_BASED_OUTPATIENT_CLINIC_OR_DEPARTMENT_OTHER): Payer: Self-pay | Admitting: *Deleted

## 2023-09-07 ENCOUNTER — Other Ambulatory Visit: Payer: Self-pay | Admitting: Cardiology

## 2023-09-08 ENCOUNTER — Ambulatory Visit (INDEPENDENT_AMBULATORY_CARE_PROVIDER_SITE_OTHER): Payer: Self-pay | Admitting: Family Medicine

## 2023-09-08 ENCOUNTER — Encounter (HOSPITAL_BASED_OUTPATIENT_CLINIC_OR_DEPARTMENT_OTHER): Payer: Self-pay | Admitting: Family Medicine

## 2023-09-08 VITALS — BP 130/85 | HR 81 | Ht 74.0 in | Wt 293.6 lb

## 2023-09-08 DIAGNOSIS — Z125 Encounter for screening for malignant neoplasm of prostate: Secondary | ICD-10-CM | POA: Diagnosis not present

## 2023-09-08 DIAGNOSIS — D869 Sarcoidosis, unspecified: Secondary | ICD-10-CM | POA: Diagnosis not present

## 2023-09-08 DIAGNOSIS — I10 Essential (primary) hypertension: Secondary | ICD-10-CM

## 2023-09-08 DIAGNOSIS — Z1211 Encounter for screening for malignant neoplasm of colon: Secondary | ICD-10-CM | POA: Diagnosis not present

## 2023-09-08 DIAGNOSIS — G4733 Obstructive sleep apnea (adult) (pediatric): Secondary | ICD-10-CM

## 2023-09-08 DIAGNOSIS — Z0000 Encounter for general adult medical examination without abnormal findings: Secondary | ICD-10-CM

## 2023-09-08 NOTE — Assessment & Plan Note (Signed)
 Patient reports that he feels that he is doing well and restless sleep.  He previously had sleep study completed and was started on CPAP, but indicates issues with CPAP in the past. Previously we have discussed importance of managing sleep apnea. Despite patient's report that symptoms are controlled, patient was falling asleep during office visit.

## 2023-09-08 NOTE — Assessment & Plan Note (Signed)
 Patient does continue to follow-up with pulmonology.  He was referred to infectious disease specialist as well as pulmonary specialist through [facility].  Records reviewed from these providers in the chart today as they are available through care everywhere. Recommend continued close follow-up with specialist for management of underlying sarcoidosis

## 2023-09-08 NOTE — [place] Notes (Signed)
    Procedures performed today:    None.  Independent interpretation of notes and tests performed by another provider:   None.  Brief History, Exam, Impression, and Recommendations:    BP 130/85 (BP Location: Right Arm, Patient Position: Sitting, Cuff Size: Normal)   Pulse 81   Ht 6\' 2"  (1.88 m)   Wt 293 lb 9.6 oz (133.2 kg)   SpO2 96%   BMI 37.70 kg/m   Patient overdue for follow-up.  Last seen more than 1 and half years ago.  Essential hypertension Assessment & Plan: Blood pressure is better controlled in office today compared to prior visits.  He does continue to follow with cardiology.  Continues with medications as prescribed. Given current control of blood pressure, can continue with current medication regimen, no adjustments made today.  Discussed considerations related to lifestyle modifications, DASH diet.   Special screening for malignant neoplasms, colon -     Cologuard  Wellness examination -     CBC with Differential/Platelet; Future -     Comprehensive metabolic panel with GFR; Future -     Hemoglobin A1c; Future -     Lipid panel; Future -     TSH Rfx on Abnormal to Free T4; Future -     PSA Total (Reflex To Free); Future  Prostate cancer screening -     PSA Total (Reflex To Free); Future  Sarcoidosis with skin and lung involvement  Assessment & Plan: Patient does continue to follow-up with pulmonology.  He was referred to infectious disease specialist as well as pulmonary specialist through [facility].  Records reviewed from these providers in the chart today as they are available through care everywhere. Recommend continued close follow-up with specialist for management of underlying sarcoidosis   OSA (obstructive sleep apnea) Assessment & Plan: Patient reports that he feels that he is doing well and restless sleep.  He previously had sleep study completed and was started on CPAP, but indicates issues with CPAP in the past. Previously we have discussed  importance of managing sleep apnea. Despite patient's report that symptoms are controlled, patient was falling asleep during office visit.   Return in about 1 month (around 10/08/2023) for CPE with fasting labs 1 week prior.  Spent 32 minutes on this patient encounter, including preparation, chart review, face-to-face counseling with patient and coordination of care, and documentation of encounter   ___________________________________________ Jesusita Jocelyn de Peru, MD, ABFM, [facility] Primary Care and Sports Medicine [facility]

## 2023-09-08 NOTE — Patient Instructions (Signed)
  Medication Instructions:  Your physician recommends that you continue on your current medications as directed. Please refer to the Current Medication list given to you today. --If you need a refill on any your medications before your next appointment, please call your pharmacy first. If no refills are authorized on file call the office.-- Lab Work: Your physician has recommended that you have lab work today: when fasting If you have labs (blood work) drawn today and your tests are completely normal, you will receive your results via MyChart message OR a phone call from our staff.  Please ensure you check your voicemail in the event that you authorized detailed messages to be left on a delegated number. If you have any lab test that is abnormal or we need to change your treatment, we will call you to review the results.   Follow-Up: Your next appointment:   Your physician recommends that you schedule a follow-up appointment in: 1 month physical with Dr. de Peru  You will receive a text message or e-mail with a link to a survey about your care and experience with [place] today! We would greatly appreciate your feedback!   Thanks for letting us be apart of your health journey!!  Primary Care and Sports Medicine   Dr. Ceasar Mons Peru   We encourage you to activate your patient portal called "MyChart".  Sign up information is provided on this After Visit Summary.  MyChart is used to connect with patients for Virtual Visits (Telemedicine).  Patients are able to view lab/test results, encounter notes, upcoming appointments, etc.  Non-urgent messages can be sent to your provider as well. To learn more about what you can do with MyChart, please visit --  [email or web address].

## 2023-09-08 NOTE — Assessment & Plan Note (Signed)
 Blood pressure is better controlled in office today compared to prior visits.  He does continue to follow with cardiology.  Continues with medications as prescribed. Given current control of blood pressure, can continue with current medication regimen, no adjustments made today.  Discussed considerations related to lifestyle modifications, DASH diet.

## 2023-09-26 LAB — COLOGUARD: COLOGUARD: NEGATIVE

## 2023-09-29 ENCOUNTER — Encounter (HOSPITAL_BASED_OUTPATIENT_CLINIC_OR_DEPARTMENT_OTHER): Payer: Self-pay | Admitting: Family Medicine

## 2023-11-03 ENCOUNTER — Other Ambulatory Visit (HOSPITAL_BASED_OUTPATIENT_CLINIC_OR_DEPARTMENT_OTHER)

## 2023-11-10 ENCOUNTER — Encounter (HOSPITAL_BASED_OUTPATIENT_CLINIC_OR_DEPARTMENT_OTHER): Admitting: Family Medicine

## 2023-12-03 ENCOUNTER — Other Ambulatory Visit: Payer: Self-pay | Admitting: Internal Medicine

## 2023-12-03 NOTE — Telephone Encounter (Signed)
 Copied from CRM [id number]. Topic: Clinical - Medication Refill >> Dec 03, 2023  3:47 PM Corean R wrote: Medication:  hydroxychloroquine  (PLAQUENIL ) 200 MG tablet Azelastine -Fluticasone  137-50 MCG/ACT SUSP   Has the patient contacted their pharmacy? NO, prescriptions expired.  (Agent: If no, request that the patient contact the pharmacy for the refill. If patient does not wish to contact the pharmacy document the reason why and proceed with request.) (Agent: If yes, when and what did the pharmacy advise?)  This is the patient's preferred pharmacy:  [id number] [place], [place] - [address] AT [facility] [address] & [facility] [address] [place] [address] Phone: [phone] Fax: [phone]  Is this the correct pharmacy for this prescription? Yes If no, delete pharmacy and type the correct one.   Has the prescription been filled recently? No  Is the patient out of the medication? Yes  Has the patient been seen for an appointment in the last year OR does the patient have an upcoming appointment? yes  Can we respond through MyChart? No  Agent: Please be advised that Rx refills may take up to 3 business days. We ask that you follow-up with your pharmacy.

## 2023-12-04 ENCOUNTER — Telehealth: Payer: Self-pay

## 2023-12-04 NOTE — Telephone Encounter (Signed)
 Copied from CRM [id number]. Topic: Clinical - Prescription Issue >> Dec 03, 2023  3:51 PM Sean Sharp wrote: Reason for CRM: Patient is requesting a refill for his Stiolto but this is not on his current meds list.  Pt should be on Trelegy as of LOV. Stiolto is not on pts med list. ATC X1. LMTCB

## 2023-12-09 MED ORDER — AZELASTINE-FLUTICASONE 137-50 MCG/ACT NA SUSP: 2.0000 | Freq: Every day | NASAL | 11 refills | Status: AC

## 2023-12-09 MED ORDER — TRELEGY ELLIPTA 100-62.5-25 MCG/ACT IN AEPB: 1.0000 | INHALATION_SPRAY | Freq: Every day | RESPIRATORY_TRACT | 11 refills | Status: AC

## 2023-12-09 NOTE — Telephone Encounter (Signed)
 I called and spoke with the pt He was asking for refill on trelegy  He is not using the stiolto  I refilled this, along with the astelin /fluticasone  nasal spray  Nothing further needed

## 2024-02-09 ENCOUNTER — Telehealth: Payer: Self-pay | Admitting: *Deleted

## 2024-02-09 NOTE — Telephone Encounter (Signed)
 ATC patient to get him rescheduled on 9/5 to 9:30 am.  Advised to return our call.  If he can not come in at 9:30 am that day, we can reschedule him for another day.  I have already double booked at 10 am that day.  When he calls back, please offer him to come in at 9:30 am instead of 9:00 am or reschedule for another day.

## 2024-02-10 NOTE — Telephone Encounter (Signed)
 ATC x2.  LVM for patient to return call regarding his 9 am appointment on Friday, 9/5 to see if he could come in at 9:30 am instead.  If not, we would be glad to reschedule him for another day.  When he calls back, move him to 9:30 am on 9/5 or let me know and I will be glad to do it.  If he needs to be rescheduled to another day, please block the 9 am slot on 9/5 as Tammy will not be here at that time.  Thank you.

## 2024-02-12 ENCOUNTER — Ambulatory Visit: Admitting: Adult Health

## 2024-04-06 ENCOUNTER — Other Ambulatory Visit: Payer: Self-pay | Admitting: Internal Medicine

## 2024-05-07 ENCOUNTER — Other Ambulatory Visit: Payer: Self-pay | Admitting: Internal Medicine

## 2024-05-12 ENCOUNTER — Other Ambulatory Visit (HOSPITAL_BASED_OUTPATIENT_CLINIC_OR_DEPARTMENT_OTHER): Payer: Self-pay | Admitting: *Deleted

## 2024-05-12 DIAGNOSIS — I10 Essential (primary) hypertension: Secondary | ICD-10-CM

## 2024-05-12 MED ORDER — AMLODIPINE BESYLATE 10 MG PO TABS: 10.0000 mg | ORAL_TABLET | Freq: Every day | ORAL | 0 refills | Status: AC

## 2024-06-29 ENCOUNTER — Encounter (HOSPITAL_BASED_OUTPATIENT_CLINIC_OR_DEPARTMENT_OTHER): Payer: Self-pay | Admitting: Family Medicine

## 2024-06-29 ENCOUNTER — Ambulatory Visit: Payer: Self-pay

## 2024-06-29 NOTE — Telephone Encounter (Signed)
 FYI Only or Action Required?: FYI only for provider: uc advised.  Patient was last seen in primary care on 09/08/2023 by de Cuba, Quintin PARAS, MD.  Called Nurse Triage reporting Leg Swelling.  Symptoms began several days ago.  Interventions attempted: Prescription medications: taking all prescribed medications.  Symptoms are: unchanged.  Triage Disposition: See Physician Within 24 Hours  Patient/caregiver understands and will follow disposition?: Yes

## 2024-06-29 NOTE — Telephone Encounter (Signed)
" ° °  Summary: left leg swelling   Reason for Triage: The patient shares that they have been experiencing swelling in their left leg for nearly 5 days. The patient shares that the swelling has increased and decreased at times but has noticed significant swelling today.    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 He states he has had episodes where his leg was swollen in the past but this time its not going down. He saw rheumatologist on Thursday, by 2pm on Friday he could tell his leg was swollen by the tightness of his skin. He states it is swollen below his knee to his ankle, foot is not swollen. He states ibuprofen has been helping with swelling. While looking at schedule RN advised pt UC due to symptoms to rule out infection, increased fluid, or other reason. He states oh yea I have a wound on my leg above my ankle that won't heal. Rn advised UC to be evaluated to rule out cellulitis.      1. ONSET: When did the swelling start? (e.g., minutes, hours, days)     Last Friday 2. LOCATION: What part of the leg is swollen?  Are both legs swollen or just one leg?     Left leg from knee to ankle 3. SEVERITY: How bad is the swelling? (e.g., localized; mild, moderate, severe)     tight 4. REDNESS: Is there redness or signs of infection?     States would not be able to tell due to skin color 5. PAIN: Is the swelling painful to touch? If Yes, ask: How painful is it?   (Scale 1-10; mild, moderate or severe)     no 6. FEVER: Do you have a fever? If Yes, ask: What is it, how was it measured, and when did it start?      denies 7. CAUSE: What do you think is causing the leg swelling?     unknown 8. MEDICAL HISTORY: Do you have a history of blood clots (e.g., DVT), cancer, heart  failure, kidney disease, or liver failure?     sarcoidosis 9. RECURRENT SYMPTOM: Have you had leg swelling before? If Yes, ask: When was the last time? What happened that time?     Yes but typically not this long 10. OTHER SYMPTOMS: Do you have any other symptoms? (e.g., chest pain, difficulty breathing)       denies  Protocols used: Leg Swelling and Edema-A-AH  "

## 2024-06-30 ENCOUNTER — Ambulatory Visit (HOSPITAL_COMMUNITY): Payer: Self-pay | Admitting: Emergency Medicine

## 2024-06-30 ENCOUNTER — Encounter (HOSPITAL_COMMUNITY): Payer: Self-pay | Admitting: Emergency Medicine

## 2024-06-30 ENCOUNTER — Ambulatory Visit (HOSPITAL_BASED_OUTPATIENT_CLINIC_OR_DEPARTMENT_OTHER)
Admit: 2024-06-30 | Discharge: 2024-06-30 | Disposition: A | Discharge status: Home / Self Care | Attending: Surgery | Admitting: Surgery

## 2024-06-30 ENCOUNTER — Ambulatory Visit (HOSPITAL_COMMUNITY)
Admission: EM | Admit: 2024-06-30 | Discharge: 2024-06-30 | Disposition: A | Discharge status: Home / Self Care | Attending: Emergency Medicine | Admitting: Emergency Medicine

## 2024-06-30 DIAGNOSIS — Z7951 Long term (current) use of inhaled steroids: Secondary | ICD-10-CM | POA: Diagnosis not present

## 2024-06-30 DIAGNOSIS — Z791 Long term (current) use of non-steroidal anti-inflammatories (NSAID): Secondary | ICD-10-CM | POA: Diagnosis not present

## 2024-06-30 DIAGNOSIS — Z79899 Other long term (current) drug therapy: Secondary | ICD-10-CM | POA: Insufficient documentation

## 2024-06-30 DIAGNOSIS — Z7952 Long term (current) use of systemic steroids: Secondary | ICD-10-CM | POA: Diagnosis not present

## 2024-06-30 DIAGNOSIS — M7989 Other specified soft tissue disorders: Secondary | ICD-10-CM

## 2024-06-30 DIAGNOSIS — Z87891 Personal history of nicotine dependence: Secondary | ICD-10-CM | POA: Diagnosis not present

## 2024-06-30 DIAGNOSIS — D869 Sarcoidosis, unspecified: Secondary | ICD-10-CM | POA: Diagnosis not present

## 2024-06-30 NOTE — Telephone Encounter (Signed)
 Discussed results of US  were negative for DVT. Encouraged f/u for potential sarcoidosis flare if needed.

## 2024-06-30 NOTE — Discharge Instructions (Addendum)
 Please go to your ultrasound appointment.  We will call with results  Tylenol  650 mg as needed for pain   Seek immediate care for coughing up blood, shortness of breath or worsening symptoms

## 2024-06-30 NOTE — ED Triage Notes (Addendum)
 Pt reports since Friday having swelling to left lower leg. Reports leg does get tight but not really bad pain. Denies bite, injury etc. Pt reports that he has taken Ibuprofen which helps with the pressure but doesn't help the swelling go down all the way.

## 2024-06-30 NOTE — ED Provider Notes (Signed)
 " [facility]    CSN: [id number] Arrival date & time: 06/30/24  9182      History   Chief Complaint Chief Complaint  Patient presents with   Leg Swelling    HPI Sean Sharp is a 49 y.o. male.   Patient presents to clinic over concern of left calf swelling.   Reports leg swelling since Friday (x7 days) Has had leg swelling before with sarcoidosis flares but he does not have other 'traditional' symptoms of a flare-up  Initially skin felt tight and later that day he felt it getting tighter (works nights) Has not gotten better over the last week so he came into clinic  Has not had prolonged sitting times or travel recently  Does have chronic skin changes to the left lower leg   The history is provided by the patient and medical records.    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  Hemoglobin electrophoresis normal 2016.   Chronic renal insufficiency, stage II (mild)    Chronic rhinitis    Cutaneous sarcoidosis (HCC)    [place] rheum: as of 02/2019->plaquenil .  Cont plaquenil  and slowly tapering prednisone  as of 06/2019 rheum f/u   Hoarseness 2015   [facility] 04/2014-normal laryngoscopy: prednisone  taper and bid nexium  recommended.  No signif help so pt got 2nd opinion at [facility]    HTN (hypertension)    Hydrocele    left   LPRD (laryngopharyngeal reflux disease)    Obesity, Class II, BMI 35-39.9    OSA (obstructive sleep apnea) 07/28/2019   SEVERE OSA, mild central sleep apnea, CPAP trial 08/2019 (Dr. Wilbert Bihari)   Peripheral edema    left leg>right leg   Prediabetes 02/2017   A1c 6.3% (right when he was starting to take prednisone  prn for treatment of his sarcoidosis).  HbA1c 08/2017=6.3%. A1c 01/2020 6.4%.   Pulmonary arterial hypertension (HCC)    Mild (R heart cath 06/27/19), echo 06/2019 normal.  Wt loss and CPAP recommended.   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 Darlean suspects it  was smoldering since 2011.  Skin bx of cutaneous lesion confirmed dx of sarcoidosis 02/2017.  Pt improving fall 2018 with plaquenil  and prednisone .  Pt self d/c'd plaquenil .   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   Upper airway cough syndrome 03/03/2022   CAP (community acquired pneumonia) 02/17/2022   Wellness examination 02/12/2022   Right inguinal pain 12/06/2021   History of hydrocele 11/15/2021   Acute bronchitis 03/05/2021   OSA (obstructive sleep apnea) 03/05/2021   Nasal congestion 09/10/2020   Sepsis due to pneumonia (HCC) 07/16/2019   Cavitary pneumonia 07/16/2019   Morbid obesity due to excess calories (HCC) 01/07/2017   Olecranon bursitis of right elbow 01/06/2017   Sarcoidosis with skin and lung involvement  12/12/2016   Uncontrolled hypertension 01/26/2015   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 Wilbert Bihari)   RIGHT  HEART CATH N/A 06/27/2019   Mild PAH. Procedure: RIGHT HEART CATH;  Surgeon: Cherrie Toribio SAUNDERS, MD;  Location: [facility] INVASIVE CV LAB;  Service: Cardiovascular;  Laterality: N/A;   SKIN BIOPSY  2018   Sarcoidosis of skin (also has pulm involvement).   SKIN GRAFT  2010   on left index finger   TRANSTHORACIC ECHOCARDIOGRAM  06/22/2019   NORMAL   Vocal cord surgery  2016   [facility]   WISDOM TOOTH EXTRACTION  2005       Home Medications    Prior to Admission medications  Medication Sig Start Date End Date Taking? Authorizing Provider  methotrexate  (RHEUMATREX) 2.5 MG tablet 1 tablet Orally weekly 05/16/24  Yes [provider]  montelukast (SINGULAIR) 10 MG tablet Take 10 mg by mouth daily. 06/12/24  Yes [provider]  albuterol  (PROVENTIL ) (2.5 MG/3ML) 0.083% nebulizer solution USE 1 VIAL VIA NEBULIZER EVERY 6 HOURS AS NEEDED FOR WHEEZING FOR SHORTNESS OF BREATH 06/22/23   Cobb, Comer GAILS, NP  albuterol  (VENTOLIN  HFA) 108 (90 Base) MCG/ACT inhaler Inhale 2 puffs into the lungs every 6 (six) hours as needed for wheezing or shortness of breath. 08/07/22   Gladis Leonor HERO, MD  amLODipine  (NORVASC ) 10 MG tablet Take 1 tablet (10 mg total) by mouth daily. 05/12/24   de Cuba, Raymond J, MD  Azelastine -Fluticasone  137-50 MCG/ACT SUSP Place 2 sprays into both nostrils daily. 12/09/23   Geronimo Amel, MD  budesonide -formoterol  (SYMBICORT ) 160-4.5 MCG/ACT inhaler Inhale 2 puffs into the lungs 2 (two) times daily. 04/20/23   Ramaswamy, Murali, MD  clobetasol ointment (TEMOVATE) 0.05 % Apply topically. 09/17/20   [provider]  Clocortolone Pivalate (CLODERM) 0.1 % cream Apply 1 application topically 2 (two) times daily as needed (Sarcoidosis/rash).  03/24/19   [provider]  Fluticasone -Umeclidin-Vilant (TRELEGY ELLIPTA ) 100-62.5-25 MCG/ACT AEPB Inhale 1 puff into the lungs daily. 12/09/23   Geronimo Amel, MD  folic acid  (FOLVITE ) 1 MG tablet Take 1 tablet (1 mg total) by mouth daily. 04/02/21   Geronimo Amel, MD  hydrocortisone  butyrate (LUCOID) 0.1 % CREA cream Apply 1 application topically 2 (two) times daily. Patient taking differently: Apply 1 application  topically 2 (two) times daily. As needed 09/04/17   McGowen, Aleene DEL, MD  hydroxychloroquine  (PLAQUENIL ) 200 MG tablet Take 200 mg by mouth 2 (two) times daily.    [provider]  hydrOXYzine (ATARAX/VISTARIL) 10 MG tablet  09/17/20   [provider]  itraconazole  (SPORANOX ) 100 MG capsule Take 2 capsules (200 mg total) by mouth 2  (two) times daily. 08/07/22   Gladis Leonor HERO, MD  levocetirizine (XYZAL ) 5 MG tablet TAKE 1/2 TABLET BY MOUTH EVERY EVENING 04/06/24   Geronimo Amel, MD  losartan  (COZAAR ) 100 MG tablet Take 1 tablet (100 mg total) by mouth daily. 04/15/23   Santo Stanly LABOR, MD  losartan  (COZAAR ) 50 MG tablet PLEASE SCHEDULE APPOINTMENT FOR MORE REFILLS [phone] OPTION 2 05/10/24   Bensimhon, Toribio SAUNDERS, MD  meloxicam  (MOBIC ) 7.5 MG tablet Take 1 tablet (7.5 mg total) by mouth daily. 11/15/21   de [place], Quintin PARAS, MD  pantoprazole  (PROTONIX ) 40 MG tablet TAKE 1 TABLET BY MOUTH 30 TO 60 MINUTES BEFORE YOUR FIRST AND LAST MEALS OF THE DAY 01/11/21   McGowen, Aleene DEL, MD  predniSONE  (DELTASONE ) 5 MG tablet Take 1 tablet (5 mg total) by mouth daily with breakfast. 10/29/22   Gladis Leonor HERO, MD  spironolactone  (ALDACTONE ) 25 MG tablet TAKE 1 TABLET(25  MG) BY MOUTH DAILY 09/07/23   Turner, Traci R, MD  triamcinolone cream (KENALOG) 0.1 % SMARTSIG:1 Application Topical 2-3 Times Daily 03/26/21   [provider]    Family History Family History  Problem Relation Age of Onset   Sarcoidosis Father        ?   Lactose intolerance Father    Other Mother        colon issues-portion of colon removed   Lactose intolerance Mother    Cancer Paternal Grandfather        ?    Social History Social History[1]   Allergies   Other and Wasp venom   Review of Systems Review of Systems  Per HPI  Physical Exam Triage Vital Signs ED Triage Vitals  Encounter Vitals Group     BP 06/30/24 0839 (!) 155/91     Girls Systolic BP Percentile --      Girls Diastolic BP Percentile --      Boys Systolic BP Percentile --      Boys Diastolic BP Percentile --      Pulse Rate 06/30/24 0839 70     Resp 06/30/24 0839 18     Temp 06/30/24 0839 97.8 F (36.6 C)     Temp Source 06/30/24 0839 Oral     SpO2 06/30/24 0839 94 %     Weight --      Height --      Head Circumference --      Peak Flow --       Pain Score 06/30/24 0837 0     Pain Loc --      Pain Education --      Exclude from Growth Chart --    No data found.  Updated Vital Signs BP (!) 155/91 (BP Location: Left Arm)   Pulse 70   Temp 97.8 F (36.6 C) (Oral)   Resp 18   SpO2 94%   Visual Acuity Right Eye Distance:   Left Eye Distance:   Bilateral Distance:    Right Eye Near:   Left Eye Near:    Bilateral Near:     Physical Exam Vitals and nursing note reviewed.  Constitutional:      Appearance: Normal appearance.  HENT:     Head: Normocephalic and atraumatic.     Right Ear: External ear normal.     Left Ear: External ear normal.     Nose: Nose normal.     Mouth/Throat:     Mouth: Mucous membranes are moist.  Eyes:     Conjunctiva/sclera: Conjunctivae normal.  Cardiovascular:     Rate and Rhythm: Normal rate.  Pulmonary:     Effort: Pulmonary effort is normal. No respiratory distress.  Musculoskeletal:     Left lower leg: Edema present.  Skin:    General: Skin is warm and dry.  Neurological:     General: No focal deficit present.     Mental Status: He is alert.  Psychiatric:        Mood and Affect: Mood normal.        Behavior: Behavior is cooperative.      UC Treatments / Results  Labs (all labs ordered are listed, but only abnormal results are displayed) Labs Reviewed - No data to display  EKG   Radiology VAS US  LOWER EXTREMITY VENOUS (DVT) (ONLY [facility]) Result Date: 06/30/2024  Lower Venous DVT Study Patient Name:  Sean Sharp  Date of Exam:   06/30/2024 Medical  Rec #: [id number]         Accession #:    [id number] Date of Birth: Dec 18, 1975          Patient Gender: M Patient Age:   49 years Exam Location:  [address] Procedure:      VAS US  LOWER EXTREMITY VENOUS (DVT) Referring Phys: Tip Atienza  BALL --------------------------------------------------------------------------------  Other Indications: Patient reports having swelling to the left lower leg since                    Friday. It  feels tight but not significant pain. Ibuprofen                    helps with pressure but doesn't help the swelling go down all                    the way. Performing Technologist: Edsel Mustard RVT  Examination Guidelines: A complete evaluation includes B-mode imaging, spectral Doppler, color Doppler, and power Doppler as needed of all accessible portions of each vessel. Bilateral testing is considered an integral part of a complete examination. Limited examinations for reoccurring indications may be performed as noted. The reflux portion of the exam is performed with the patient in reverse Trendelenburg.  +-----+---------------+---------+-----------+----------+--------------+ RIGHTCompressibilityPhasicitySpontaneityPropertiesThrombus Aging +-----+---------------+---------+-----------+----------+--------------+ CFV  Full           Yes      Yes                                 +-----+---------------+---------+-----------+----------+--------------+   +---------+---------------+---------+-----------+----------+--------------+ LEFT     CompressibilityPhasicitySpontaneityPropertiesThrombus Aging +---------+---------------+---------+-----------+----------+--------------+ CFV      Full           Yes      Yes                                 +---------+---------------+---------+-----------+----------+--------------+ SFJ      Full           Yes      Yes                                 +---------+---------------+---------+-----------+----------+--------------+ FV Prox  Full           Yes      Yes                                 +---------+---------------+---------+-----------+----------+--------------+ FV Mid   Full           Yes      Yes                                 +---------+---------------+---------+-----------+----------+--------------+ FV DistalFull           Yes      Yes                                  +---------+---------------+---------+-----------+----------+--------------+ PFV      Full                                                        +---------+---------------+---------+-----------+----------+--------------+  POP      Full           Yes      Yes                                 +---------+---------------+---------+-----------+----------+--------------+ PTV      Full           Yes      Yes                                 +---------+---------------+---------+-----------+----------+--------------+ PERO                    Yes      Yes                                 +---------+---------------+---------+-----------+----------+--------------+ Gastroc  Full                                                        +---------+---------------+---------+-----------+----------+--------------+   Left Technical Findings: Calf veins poorly visualized due to swelling and habitus, however they do appear patent via color and spectral Doppler.  Findings reported to Annmarie Plemmons  Mercer, FNP at 11:30 am.  Summary: RIGHT: - No evidence of common femoral vein obstruction.   LEFT: - No evidence of deep vein thrombosis in the lower extremity. No indirect evidence of obstruction proximal to the inguinal ligament.   *See table(s) above for measurements and observations.    Preliminary     Procedures Procedures (including critical care time)  Medications Ordered in UC Medications - No data to display  Initial Impression / Assessment and Plan / UC Course  I have reviewed the triage vital signs and the nursing notes.  Pertinent labs & imaging results that were available during my care of the patient were reviewed by me and considered in my medical decision making (see chart for details).  Vitals and triage reviewed, patient is hemodynamically stable.   Significant left calf edema.  Chronic venous insufficiency with dry skin and scabbing to the left lower leg.  Scheduled for DVT study within  the next hour or so.  Will call patient with results.  Results were negative for DVT.  Encouraged specialty follow-up over concern of sarcoidosis flare.  Patient agreeable.  No questions at this time.     Final Clinical Impressions(s) / UC Diagnoses   Final diagnoses:  Leg swelling     Discharge Instructions      Please go to your ultrasound appointment.  We will call with results  Tylenol  650 mg as needed for pain   Seek immediate care for coughing up blood, shortness of breath or worsening symptoms      ED Prescriptions   None    PDMP not reviewed this encounter.     [1]  Social History Tobacco Use   Smoking status: Former    Current packs/day: 0.00    Average packs/day: 0.3 packs/day for 1.5 years (0.4 ttl pk-yrs)    Types: Cigarettes    Start date: 12/09/1993    Quit date: 06/10/1995    Years since quitting: 29.0  Passive exposure: Past   Smokeless tobacco: Never  Vaping Use   Vaping status: Never Used  Substance Use Topics   Alcohol use: Yes    Comment: occasional   Drug use: No     Mercer Florian MATSU, FNP 06/30/24 1533  "

## 2024-08-02 ENCOUNTER — Ambulatory Visit (HOSPITAL_BASED_OUTPATIENT_CLINIC_OR_DEPARTMENT_OTHER): Admitting: Family Medicine
